# Patient Record
Sex: Male | Born: 1970 | Race: White | Hispanic: No | Smoking: Former smoker
Health system: Southern US, Community
[De-identification: ages and names within clinical notes are randomized; demographics above are authoritative.]

## PROBLEM LIST (undated history)

## (undated) DIAGNOSIS — F1910 Other psychoactive substance abuse, uncomplicated: Secondary | ICD-10-CM

## (undated) DIAGNOSIS — Z720 Tobacco use: Secondary | ICD-10-CM

## (undated) DIAGNOSIS — Z789 Other specified health status: Secondary | ICD-10-CM

## (undated) DIAGNOSIS — R600 Localized edema: Secondary | ICD-10-CM

## (undated) DIAGNOSIS — R0902 Hypoxemia: Secondary | ICD-10-CM

## (undated) DIAGNOSIS — F1990 Other psychoactive substance use, unspecified, uncomplicated: Secondary | ICD-10-CM

## (undated) DIAGNOSIS — Q211 Atrial septal defect: Secondary | ICD-10-CM

## (undated) DIAGNOSIS — G4730 Sleep apnea, unspecified: Secondary | ICD-10-CM

## (undated) DIAGNOSIS — I10 Essential (primary) hypertension: Secondary | ICD-10-CM

## (undated) DIAGNOSIS — J449 Chronic obstructive pulmonary disease, unspecified: Secondary | ICD-10-CM

## (undated) DIAGNOSIS — I739 Peripheral vascular disease, unspecified: Secondary | ICD-10-CM

## (undated) DIAGNOSIS — E669 Obesity, unspecified: Secondary | ICD-10-CM

## (undated) DIAGNOSIS — D751 Secondary polycythemia: Secondary | ICD-10-CM

## (undated) DIAGNOSIS — F1090 Alcohol use, unspecified, uncomplicated: Secondary | ICD-10-CM

## (undated) DIAGNOSIS — Q2112 Patent foramen ovale: Secondary | ICD-10-CM

## (undated) DIAGNOSIS — Z8709 Personal history of other diseases of the respiratory system: Secondary | ICD-10-CM

## (undated) DIAGNOSIS — T7840XA Allergy, unspecified, initial encounter: Secondary | ICD-10-CM

## (undated) DIAGNOSIS — R0602 Shortness of breath: Secondary | ICD-10-CM

## (undated) HISTORY — DX: Other psychoactive substance use, unspecified, uncomplicated: F19.90

## (undated) HISTORY — DX: Atrial septal defect: Q21.1

## (undated) HISTORY — DX: Shortness of breath: R06.02

## (undated) HISTORY — DX: Tobacco use: Z72.0

## (undated) HISTORY — DX: Essential (primary) hypertension: I10

## (undated) HISTORY — DX: Sleep apnea, unspecified: G47.30

## (undated) HISTORY — DX: Patent foramen ovale: Q21.12

## (undated) HISTORY — DX: Localized edema: R60.0

## (undated) HISTORY — DX: Obesity, unspecified: E66.9

## (undated) HISTORY — DX: Hypoxemia: R09.02

## (undated) HISTORY — DX: Peripheral vascular disease, unspecified: I73.9

## (undated) HISTORY — PX: TYMPANOSTOMY TUBE PLACEMENT: SHX32

## (undated) HISTORY — DX: Alcohol use, unspecified, uncomplicated: F10.90

## (undated) HISTORY — DX: Chronic obstructive pulmonary disease, unspecified: J44.9

## (undated) HISTORY — DX: Personal history of other diseases of the respiratory system: Z87.09

## (undated) HISTORY — DX: Allergy, unspecified, initial encounter: T78.40XA

## (undated) HISTORY — DX: Secondary polycythemia: D75.1

## (undated) HISTORY — DX: Other psychoactive substance abuse, uncomplicated: F19.10

## (undated) HISTORY — DX: Other specified health status: Z78.9

---
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
## 2003-05-04 ENCOUNTER — Emergency Department (HOSPITAL_COMMUNITY): Admission: EM | Admit: 2003-05-04 | Discharge: 2003-05-04 | Payer: Self-pay | Admitting: Emergency Medicine

## 2005-03-19 ENCOUNTER — Emergency Department (HOSPITAL_COMMUNITY): Admission: EM | Admit: 2005-03-19 | Discharge: 2005-03-19 | Payer: Self-pay | Admitting: Family Medicine

## 2006-07-30 ENCOUNTER — Emergency Department (HOSPITAL_COMMUNITY): Admission: EM | Admit: 2006-07-30 | Discharge: 2006-07-30 | Payer: Self-pay | Admitting: Family Medicine

## 2008-03-12 ENCOUNTER — Emergency Department (HOSPITAL_COMMUNITY): Admission: EM | Admit: 2008-03-12 | Discharge: 2008-03-12 | Payer: Self-pay | Admitting: Family Medicine

## 2008-07-10 ENCOUNTER — Emergency Department (HOSPITAL_COMMUNITY): Admission: EM | Admit: 2008-07-10 | Discharge: 2008-07-10 | Payer: Self-pay | Admitting: Emergency Medicine

## 2008-11-20 ENCOUNTER — Ambulatory Visit (HOSPITAL_COMMUNITY): Admission: RE | Admit: 2008-11-20 | Discharge: 2008-11-20 | Payer: Self-pay | Admitting: Interventional Cardiology

## 2008-12-07 ENCOUNTER — Ambulatory Visit (HOSPITAL_COMMUNITY): Admission: RE | Admit: 2008-12-07 | Discharge: 2008-12-07 | Payer: Self-pay | Admitting: Interventional Cardiology

## 2008-12-07 ENCOUNTER — Encounter (INDEPENDENT_AMBULATORY_CARE_PROVIDER_SITE_OTHER): Payer: Self-pay | Admitting: Interventional Cardiology

## 2009-01-06 DIAGNOSIS — J449 Chronic obstructive pulmonary disease, unspecified: Secondary | ICD-10-CM | POA: Insufficient documentation

## 2009-01-06 DIAGNOSIS — E669 Obesity, unspecified: Secondary | ICD-10-CM | POA: Insufficient documentation

## 2009-01-06 DIAGNOSIS — J4489 Other specified chronic obstructive pulmonary disease: Secondary | ICD-10-CM | POA: Insufficient documentation

## 2009-01-06 DIAGNOSIS — I739 Peripheral vascular disease, unspecified: Secondary | ICD-10-CM | POA: Insufficient documentation

## 2009-01-06 DIAGNOSIS — F17200 Nicotine dependence, unspecified, uncomplicated: Secondary | ICD-10-CM | POA: Insufficient documentation

## 2009-09-01 ENCOUNTER — Observation Stay (HOSPITAL_COMMUNITY): Admission: EM | Admit: 2009-09-01 | Discharge: 2009-09-06 | Payer: Self-pay | Admitting: Emergency Medicine

## 2009-09-01 ENCOUNTER — Emergency Department (HOSPITAL_COMMUNITY): Admission: EM | Admit: 2009-09-01 | Discharge: 2009-09-01 | Payer: Self-pay | Admitting: Family Medicine

## 2009-09-01 ENCOUNTER — Encounter (INDEPENDENT_AMBULATORY_CARE_PROVIDER_SITE_OTHER): Payer: Self-pay | Admitting: Emergency Medicine

## 2009-09-01 ENCOUNTER — Ambulatory Visit: Payer: Self-pay | Admitting: Vascular Surgery

## 2009-09-03 ENCOUNTER — Ambulatory Visit: Payer: Self-pay | Admitting: Hematology & Oncology

## 2009-09-15 ENCOUNTER — Ambulatory Visit: Payer: Self-pay | Admitting: Hematology & Oncology

## 2009-09-16 ENCOUNTER — Ambulatory Visit: Payer: Self-pay | Admitting: Internal Medicine

## 2009-09-16 ENCOUNTER — Encounter: Payer: Self-pay | Admitting: Physician Assistant

## 2009-09-16 DIAGNOSIS — J449 Chronic obstructive pulmonary disease, unspecified: Secondary | ICD-10-CM | POA: Insufficient documentation

## 2009-09-16 DIAGNOSIS — F1010 Alcohol abuse, uncomplicated: Secondary | ICD-10-CM | POA: Insufficient documentation

## 2009-09-16 DIAGNOSIS — D696 Thrombocytopenia, unspecified: Secondary | ICD-10-CM | POA: Insufficient documentation

## 2009-09-16 DIAGNOSIS — L02419 Cutaneous abscess of limb, unspecified: Secondary | ICD-10-CM | POA: Insufficient documentation

## 2009-09-16 DIAGNOSIS — L03119 Cellulitis of unspecified part of limb: Secondary | ICD-10-CM

## 2009-09-16 LAB — CONVERTED CEMR LAB
ALT: 34 units/L (ref 0–53)
Alkaline Phosphatase: 72 units/L (ref 39–117)
Glucose, Bld: 76 mg/dL (ref 70–99)
HCV Ab: NEGATIVE
Hep B Core Total Ab: NEGATIVE
Hep B S Ab: NEGATIVE
Sodium: 136 meq/L (ref 135–145)
Total Bilirubin: 0.8 mg/dL (ref 0.3–1.2)
Total Protein: 7.5 g/dL (ref 6.0–8.3)

## 2009-09-17 ENCOUNTER — Encounter: Payer: Self-pay | Admitting: Physician Assistant

## 2009-09-17 LAB — CONVERTED CEMR LAB
Potassium: 5 meq/L (ref 3.5–5.3)
Sodium: 141 meq/L (ref 135–145)

## 2009-09-21 ENCOUNTER — Encounter: Payer: Self-pay | Admitting: Physician Assistant

## 2009-09-21 LAB — MANUAL DIFFERENTIAL (CHCC SATELLITE)
ALC: 1.7 10*3/uL (ref 0.9–3.3)
ANC (CHCC HP manual diff): 4.2 10*3/uL (ref 1.5–6.5)
LYMPH: 25 % (ref 14–48)
MONO: 6 % (ref 0–13)
Platelet Morphology: NORMAL
SEG: 64 % (ref 40–75)

## 2009-09-21 LAB — CBC WITH DIFFERENTIAL (CANCER CENTER ONLY)
HCT: 69.4 % — ABNORMAL HIGH (ref 38.7–49.9)
HGB: 23 g/dL — ABNORMAL HIGH (ref 13.0–17.1)
MCHC: 33.1 g/dL (ref 32.0–35.9)
RBC: 6.46 10*6/uL — ABNORMAL HIGH (ref 4.20–5.70)

## 2009-09-21 LAB — CHCC SATELLITE - SMEAR

## 2009-09-22 ENCOUNTER — Telehealth: Payer: Self-pay | Admitting: Physician Assistant

## 2009-09-22 DIAGNOSIS — D751 Secondary polycythemia: Secondary | ICD-10-CM | POA: Insufficient documentation

## 2009-09-23 LAB — RETICULOCYTES (CHCC): ABS Retic: 71.3 10*3/uL (ref 19.0–186.0)

## 2009-09-23 LAB — IRON AND TIBC
%SAT: 26 % (ref 20–55)
Iron: 95 ug/dL (ref 42–165)
TIBC: 370 ug/dL (ref 215–435)
UIBC: 275 ug/dL

## 2009-09-23 LAB — FERRITIN: Ferritin: 79 ng/mL (ref 22–322)

## 2009-10-06 LAB — MANUAL DIFFERENTIAL (CHCC SATELLITE)
ALC: 1.2 10*3/uL (ref 0.9–3.3)
SEG: 73 % (ref 40–75)

## 2009-10-06 LAB — CBC WITH DIFFERENTIAL (CANCER CENTER ONLY)
Platelets: 99 10*3/uL — ABNORMAL LOW (ref 145–400)
RBC: 6.21 10*6/uL — ABNORMAL HIGH (ref 4.20–5.70)
RDW: 10.5 % (ref 10.5–14.6)
WBC: 5.4 10*3/uL (ref 4.0–10.0)

## 2009-10-06 LAB — CHCC SATELLITE - SMEAR

## 2009-10-08 ENCOUNTER — Telehealth: Payer: Self-pay | Admitting: Physician Assistant

## 2009-10-26 ENCOUNTER — Ambulatory Visit: Payer: Self-pay | Admitting: Hematology & Oncology

## 2009-10-27 ENCOUNTER — Encounter: Payer: Self-pay | Admitting: Physician Assistant

## 2009-10-27 LAB — FERRITIN: Ferritin: 44 ng/mL (ref 22–322)

## 2009-10-27 LAB — MANUAL DIFFERENTIAL (CHCC SATELLITE)
ALC: 0.8 10*3/uL — ABNORMAL LOW (ref 0.9–3.3)
ANC (CHCC HP manual diff): 4.8 10*3/uL (ref 1.5–6.5)
Eos: 2 % (ref 0–7)
MONO: 3 % (ref 0–13)
Platelet Morphology: NORMAL
SEG: 81 % — ABNORMAL HIGH (ref 40–75)

## 2009-10-27 LAB — CBC WITH DIFFERENTIAL (CANCER CENTER ONLY)
HCT: 70.1 % — ABNORMAL HIGH (ref 38.7–49.9)
MCV: 109 fL — ABNORMAL HIGH (ref 82–98)
Platelets: 104 10*3/uL — ABNORMAL LOW (ref 145–400)
RBC: 6.4 10*6/uL — ABNORMAL HIGH (ref 4.20–5.70)
WBC: 6 10*3/uL (ref 4.0–10.0)

## 2009-11-04 ENCOUNTER — Encounter: Payer: Self-pay | Admitting: Physician Assistant

## 2009-11-17 ENCOUNTER — Ambulatory Visit: Payer: Self-pay | Admitting: Internal Medicine

## 2009-11-17 DIAGNOSIS — Q2111 Secundum atrial septal defect: Secondary | ICD-10-CM | POA: Insufficient documentation

## 2009-11-17 DIAGNOSIS — Q211 Atrial septal defect: Secondary | ICD-10-CM | POA: Insufficient documentation

## 2009-11-17 DIAGNOSIS — R0902 Hypoxemia: Secondary | ICD-10-CM | POA: Insufficient documentation

## 2009-11-18 ENCOUNTER — Encounter: Payer: Self-pay | Admitting: Physician Assistant

## 2009-11-19 ENCOUNTER — Encounter (INDEPENDENT_AMBULATORY_CARE_PROVIDER_SITE_OTHER): Payer: Self-pay | Admitting: Internal Medicine

## 2009-11-19 ENCOUNTER — Ambulatory Visit: Payer: Self-pay | Admitting: Physician Assistant

## 2009-11-19 ENCOUNTER — Ambulatory Visit (HOSPITAL_COMMUNITY): Admission: RE | Admit: 2009-11-19 | Discharge: 2009-11-19 | Payer: Self-pay | Admitting: Internal Medicine

## 2009-11-19 ENCOUNTER — Ambulatory Visit: Payer: Self-pay | Admitting: Vascular Surgery

## 2009-11-23 ENCOUNTER — Encounter (INDEPENDENT_AMBULATORY_CARE_PROVIDER_SITE_OTHER): Payer: Self-pay | Admitting: *Deleted

## 2009-11-26 ENCOUNTER — Ambulatory Visit (HOSPITAL_BASED_OUTPATIENT_CLINIC_OR_DEPARTMENT_OTHER): Admission: RE | Admit: 2009-11-26 | Discharge: 2009-11-26 | Payer: Self-pay | Admitting: Internal Medicine

## 2009-11-26 ENCOUNTER — Encounter: Payer: Self-pay | Admitting: Physician Assistant

## 2009-12-04 ENCOUNTER — Ambulatory Visit: Payer: Self-pay | Admitting: Internal Medicine

## 2009-12-08 ENCOUNTER — Ambulatory Visit: Payer: Self-pay | Admitting: Hematology & Oncology

## 2009-12-14 ENCOUNTER — Telehealth: Payer: Self-pay | Admitting: Physician Assistant

## 2009-12-14 DIAGNOSIS — G4733 Obstructive sleep apnea (adult) (pediatric): Secondary | ICD-10-CM | POA: Insufficient documentation

## 2009-12-16 ENCOUNTER — Encounter (INDEPENDENT_AMBULATORY_CARE_PROVIDER_SITE_OTHER): Payer: Self-pay | Admitting: Nurse Practitioner

## 2009-12-16 LAB — CBC WITH DIFFERENTIAL (CANCER CENTER ONLY)
HCT: 69.1 % — ABNORMAL HIGH (ref 38.7–49.9)
MCV: 104 fL — ABNORMAL HIGH (ref 82–98)
RDW: 10.6 % (ref 10.5–14.6)
WBC: 7.3 10*3/uL (ref 4.0–10.0)

## 2009-12-16 LAB — MANUAL DIFFERENTIAL (CHCC SATELLITE)
ALC: 1.2 10*3/uL (ref 0.9–3.3)
LYMPH: 16 % (ref 14–48)
MONO: 7 % (ref 0–13)
SEG: 77 % — ABNORMAL HIGH (ref 40–75)

## 2009-12-16 LAB — FERRITIN: Ferritin: 24 ng/mL (ref 22–322)

## 2009-12-21 ENCOUNTER — Telehealth (INDEPENDENT_AMBULATORY_CARE_PROVIDER_SITE_OTHER): Payer: Self-pay | Admitting: *Deleted

## 2009-12-22 ENCOUNTER — Ambulatory Visit: Payer: Self-pay | Admitting: Nurse Practitioner

## 2009-12-22 DIAGNOSIS — H10029 Other mucopurulent conjunctivitis, unspecified eye: Secondary | ICD-10-CM | POA: Insufficient documentation

## 2010-01-07 ENCOUNTER — Encounter: Payer: Self-pay | Admitting: Physician Assistant

## 2010-02-02 ENCOUNTER — Ambulatory Visit: Payer: Self-pay | Admitting: Pulmonary Disease

## 2010-02-02 ENCOUNTER — Telehealth (INDEPENDENT_AMBULATORY_CARE_PROVIDER_SITE_OTHER): Payer: Self-pay | Admitting: Nurse Practitioner

## 2010-02-03 ENCOUNTER — Ambulatory Visit: Payer: Self-pay | Admitting: Hematology & Oncology

## 2010-03-29 NOTE — Letter (Signed)
Summary: MAILED REQUESTED RECORDS TO [facility]  MAILED REQUESTED RECORDS TO [facility]   Imported By: Arta Bruce 01/07/2010 12:17:25  _____________________________________________________________________  External Attachment:    Type:   Image     Comment:   External Document

## 2010-03-29 NOTE — Progress Notes (Signed)
Summary: not enough specimen  Phone Note From Other Clinic   Summary of Call: [facility] called there was not enough specimen for Hepatitis BE antibody this was part of the chronic hep panel. It was drawn on 09-16-09 Initial call taken by: Levon Hedger,  September 22, 2009 4:20 PM  Follow-up for Phone Call        cancel it Follow-up by: Brynda Rim,  September 23, 2009 5:35 AM  Additional Follow-up for Phone Call Additional follow up Details #1::        ok Additional Follow-up by: Armenia Shannon,  September 23, 2009 12:53 PM

## 2010-03-29 NOTE — Letter (Signed)
Summary: [facility]/PROGRESS NOTE  [facility]/PROGRESS NOTE   Imported By: Arta Bruce 10/08/2009 14:44:01  _____________________________________________________________________  External Attachment:    Type:   Image     Comment:   External Document

## 2010-03-29 NOTE — Assessment & Plan Note (Signed)
Summary: XFU---RIGHT EXTREMITY CELLULITUS//YC   Vital Signs:  Patient profile:   40 year old male Weight:      285 pounds Temp:     97.9 degrees F oral Pulse rate:   98 / minute Pulse rhythm:   regular Resp:     18 per minute BP sitting:   129 / 84  (left arm)  Vitals Entered By: Armenia Shannon (September 16, 2009 3:10 PM) CC: hospital follow-up, no current issues Is Patient Diabetic? No Pain Assessment Patient in pain? no       Does patient need assistance? Functional Status Self care Ambulation Normal   Primary Care Provider:  Tereso Newcomer, PA-C  CC:  hospital follow-up and no current issues.  History of Present Illness: 40 year old male presents for posthospitalization followup.  He was admitted for cellulitis of his right lower extremity.  The patient has had a long history of polycythemia.  This had an initial workup sometime last summer.  The patient describes seeing multiple specialists including a cardiologist and a pulmonologist.  He did have a transesophageal echocardiogram that demonstrated an EF of 55-60%,  a patent foramen ovale with right-to-left shunting and increased right-sided pressures.  He apparently had a CT scan.  He was worked up extensively to rule out cancer.  When he was admitted to [facility] recently for his cellulitis, he had venous Dopplers that were negative for DVT.  He did have an abdominal ultrasound that was notable for hepatomegaly but no cirrhosis.  He had a hemoglobin of 21.5 with an MCV of 108.2.  His ferritin was 44.  His B12 was 365.  His glucose was 108, creatinine 0.92, LFTs okay and albumin 3.3.  His TSH was 1.005.  He had an MRI of his leg that demonstrated findings of cellulitis but no evidence of osteomyelitis.  Is currently doing much better.  He denies any fevers or chills.  His leg is much better.  He states he sees a hematologist next week.  He was seen by Dr.Ennever in the hospital.  He recommended obtaining a JAK-2 analysis.   This was not detected.  He is on aspirin a day.  There is a question whether or not he will undergo phlebotomy as an outpatient.  Current Medications (verified): 1)  No Daily Medications  Allergies (verified): No Known Drug Allergies  Past History:  Past Medical History: Current Problems:  PERIPHERAL VASCULAR DISEASE (ICD-443.9)  -  patient denis TOBACCO ABUSE (ICD-305.1) OBESITY (ICD-278.00) COPD Hx of BRONCHITIS (ICD-491.20) Polycythemia Vera   a.  Dr. Myna Hidalgo TEE (echo) 2010:   a.  EF 55-60%; patent foramen ovale with R to L shunt; evidence of elevated right pressures  Family History: Wegener's disease -- mother no colon or prostate cancer Lung CA - PGM  Social History: Patient is a current smoker.    a.  1ppd x 25 years Social ETOH use   a.  previously drinking 12 pack beer a day; cut down to a few beers a day now since in hospital +Marijuana +Valium (not prescribed) Unemployed  Review of Systems General:  Denies chills and fever. CV:  Denies chest pain or discomfort, fainting, and shortness of breath with exertion; nyha class 2. Resp:  Complains of cough and wheezing. GI:  Denies change in bowel habits.  Physical Exam  General:  alert, well-developed, and well-nourished.   Head:  normocephalic and atraumatic.   Mouth:  cyanotic appearing lips  Neck:  supple, no carotid bruits, and no  cervical lymphadenopathy.   Lungs:  decreased breath sounds bilat exp wheezing no rales  Heart:  normal rate and regular rhythm.  distant heart sounds  Abdomen:  soft, non-tender, and no hepatomegaly.   Extremities:  1+ left pedal edema and 1+ right pedal edema.   erythema of RLE appears to be in resolution no calor or dolor  Neurologic:  alert & oriented X3 and cranial nerves II-XII intact.   Skin:  dusky appearance to skin of toes bilat  Psych:  normally interactive.     Impression & Recommendations:  Problem # 1:  ALCOHOL ABUSE (ICD-305.00) check labs for  hepatitis  Orders: T-Comprehensive Metabolic Panel (16109-60454) T-Hepatitis A Antibody (09811-91478) T-Hepatitis B Core Antibody (29562-13086) T-Hepatitis B Surface Antibody (57846-96295) T-Hepatitis B Surface Antigen (28413-24401) T-Hepatitis C Antibody (02725-36644)  Problem # 2:  THROMBOCYTOPENIA (ICD-287.5) as above  Orders: T-Comprehensive Metabolic Panel (03474-25956) T-Hepatitis A Antibody (38756-43329) T-Hepatitis B Core Antibody (51884-16606) T-Hepatitis B Surface Antibody (30160-10932) T-Hepatitis B Surface Antigen (35573-22025) T-Hepatitis C Antibody (42706-23762)  Problem # 3:  COPD (ICD-496) add spiriva and proventil  His updated medication list for this problem includes:    Spiriva Handihaler 18 Mcg Caps (Tiotropium bromide monohydrate) .Marland Kitchen... 1 inhalation daily    Proventil Hfa 108 (90 Base) Mcg/act Aers (Albuterol sulfate) .Marland Kitchen... 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 1 inhalation daily 3)  Proventil Hfa 108 (90 Base) Mcg/act Aers (Albuterol sulfate) .Marland Kitchen.. 1-2 puffs every 4-6 hours as needed 4)  Aspirin 81 Mg Tabs (Aspirin) .... Once daily  Patient Instructions: 1)  Need ALL records from Dr. Nicholos Johns, Dr. Eldridge Dace and lung doctor you saw (patient has name at home). 2)  Ask Dr. Myna Hidalgo to send me his notes. 3)  Return for follow up with Scott in 4 weeks. 4)  Finish antibiotics. 5)  Call me if leg gets red or you develop a fever.  You will need to be seen that day.  Go to the ER if it is on the weekend. Prescriptions: PROVENTIL HFA 108 (90 BASE) MCG/ACT  AERS (ALBUTEROL SULFATE) 1-2 puffs every 4-6 hours as needed  #1 x 11   Entered and Authorized by:   Tereso Newcomer PA-C   Signed by:   Tereso Newcomer PA-C on 09/16/2009   Method used:   Print then Give to Patient   RxID:   [id number] SPIRIVA HANDIHALER 18 MCG CAPS (TIOTROPIUM BROMIDE MONOHYDRATE) 1 inhalation daily  #1 x 5   Entered and Authorized by:   Tereso Newcomer PA-C   Signed by:   Tereso Newcomer PA-C on 09/16/2009   Method used:   Print then Give to Patient   RxID:   [id number]    TE Echocardiogram  Procedure date:  12/07/2008  Findings:      Study Conclusions    1. Left ventricle: Systolic function was normal. The estimated      ejection fraction was in the range of 55% to 60%.   2. Aortic valve: No evidence of vegetation.   3. Mitral valve: No evidence of vegetation.   4. Left atrium: No evidence  of thrombus in the atrial cavity or      appendage.   5. Right ventricle: The cavity size was moderately dilated. Systolic      function was mildly reduced.   6. Right atrium: The atrium was moderately dilated.   7. Atrial septum: There was a patent foramen ovale with evidence of      right to left shunting, which also suggests elevated right sided      pressures.   Transesophageal echocardiography. 2D and color Doppler. Patient   status: Outpatient. Location: Endoscopy.

## 2010-03-29 NOTE — Assessment & Plan Note (Signed)
Summary: Acute - Bacterial Conjunctivitis   Vital Signs:  Patient profile:   40 year old male Height:      73 inches Weight:      287.4 pounds BMI:     38.05 O2 Sat:      88 % on Room air Temp:     98.1 degrees F oral Pulse rate:   98 / minute Pulse rhythm:   regular Resp:     20 per minute BP sitting:   110 / 70  (left arm) Cuff size:   regular  Vitals Entered By: Levon Hedger (December 22, 2009 11:49 AM)  Nutrition Counseling: Patient's BMI is greater than 25 and therefore counseled on weight management options.  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 No Pain Assessment Patient in pain? no       Does patient need assistance? 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  History of Present Illness:  Pt into the office with requests to speak with the Klair Leising. He has concerns about his recent health diagnoses and treatment options. Reports that he was admitted to the hospital for cellulitis back in July. He has had several visits into this office since that time and has been dx with polycythemia (secondary) Pt did have a sleep study done and proved that he has severe sleep apnea.  He had severe desaturation. He was started on oxygen to wear at night. He has gotten oxygen at home and wears it at night. Oxygen was delivered 5 days ago and he applies it at 2 liters. 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  He is used to being up and moving around/ hands on on jobs.  Pt does  have 2 twin boys who live with their mother however he provides financial support for them while he is working.   Habits & Providers  Alcohol-Tobacco-Diet     Tobacco Status: current     Tobacco Counseling: to quit use of tobacco products     Cigarette Packs/Day: 0.75     Year Started: age 40  Exercise-Depression-Behavior     Does Patient Exercise: no     Have you felt down or hopeless? no     Have you felt little pleasure in things? no     Depression Counseling: not indicated; screening negative for depression  Medications Prior to Update: 1)  Spiriva Handihaler 18 Mcg Caps (Tiotropium Bromide Monohydrate) .Marland Kitchen.. 1 Inhalation Daily 2)  Proventil Hfa 108 (90 Base) Mcg/act Aers (Albuterol Sulfate) .Marland Kitchen.. 1-2 Puffs Every 4-6 Hours As Needed 3)  Aspirin 81 Mg Tabs (Aspirin) .... Once Daily 4)  O2 Per Nasal Cannula, Humidified .... 2 Lpm--Titrate To Keep O2 Above 90 % Sat  Allergies (verified): No Known Drug Allergies  Social History: Packs/Day:  0.75 Does Patient Exercise:  no  Review of Systems Eyes:  +bacterial infection in the left eye. CV:  Denies chest pain or discomfort. Resp:  Complains of cough and shortness of breath. GI:  Denies abdominal pain, nausea, and vomiting.  Physical Exam  General:  alert.   Head:  normocephalic.   Eyes:  left eye - inflammation  conjunctiva (inflammed) exudate on eyelid Mouth:  cyanotic lips Lungs:  scattered expiratory wheezes Heart:  normal rate and regular rhythm.   Msk:  up to the exam table Neurologic:  alert & oriented X3.   Psych:  Oriented X3.     Impression & Recommendations:  Problem # 1:  SLEEP APNEA, OBSTRUCTIVE (ICD-327.23)  Problem # 2:  HYPOXEMIA (ICD-799.02) pt to wear oxygen at night O2 sat down to 88 in office today he may need  Orders: Pulse Oximetry (single measurment) (29562)  Problem # 3:  CONJUNCTIVITIS, BACTERIAL (ICD-372.03)  His updated medication list for this problem includes:    Tobramycin  Sulfate 0.3 % Soln (Tobramycin sulfate) .Marland Kitchen... 2 drops in left eye three times a day for infection  Problem # 4:  COPD (ICD-496) pt has not started spiriva (need to take tax forms to pharmacy) Will give  His updated medication list for this problem includes:    Spiriva Handihaler 18 Mcg Caps (Tiotropium bromide monohydrate) .Marland Kitchen... 1 inhalation daily    Proventil Hfa 108 (90 Base) Mcg/act Aers (Albuterol sulfate) .Marland Kitchen... 1-2 puffs every 4-6 hours as needed    Symbicort 160-4.5 Mcg/act Aero (Budesonide-formoterol fumarate) ..... One puff inhalation two times a day  Complete Medication List: 1)  Spiriva Handihaler 18 Mcg Caps (Tiotropium bromide monohydrate) .Marland Kitchen.. 1 inhalation daily 2)  Proventil Hfa 108 (90 Base) Mcg/act Aers (Albuterol sulfate) .Marland Kitchen.. 1-2 puffs every 4-6 hours as needed 3)  Aspirin 81 Mg Tabs (Aspirin) .... Once daily 4)  O2 Per Nasal Cannula, Humidified  .... 2 lpm--titrate to keep o2 above 90 % sat 5)  Tobramycin Sulfate 0.3 % Soln (Tobramycin sulfate) .... 2 drops in left eye three times a day for infection 6)  Symbicort 160-4.5 Mcg/act Aero (Budesonide-formoterol fumarate) .... One puff inhalation two times a day  Patient Instructions: 1)  You have declined the flu vaccine today.  If you change your mind you can return for a nurse visit. 2)  Use Symbicort 160/4.5 - one inhalation two times a day (samples given).  Use this until you get the spiriva. 3)  Wear your oxygen at night as ordered. 4)  Keep your appointment with the pulmonologist.  He will be able to direct you more on treatment and limitation of current problem Prescriptions: SYMBICORT 160-4.5 MCG/ACT AERO (BUDESONIDE-FORMOTEROL FUMARATE) One puff inhalation two times a day  #1 x 0   Entered and Authorized by:   Lehman Prom FNP   Signed by:   Lehman Prom FNP on 12/22/2009   Method used:   Samples Given   RxID:   [id number] TOBRAMYCIN SULFATE 0.3 % SOLN (TOBRAMYCIN SULFATE) 2 drops in left eye three  times a day for infection  #70ml x 0   Entered and Authorized by:   Lehman Prom FNP   Signed by:   Lehman Prom FNP on 12/22/2009   Method used:   Print then Give to Patient   RxID:   [id number]    Orders Added: 1)  Est. Patient Level III [64403] 2)  Pulse Oximetry (single measurment) [47425]    Prevention & Chronic Care Immunizations   Influenza vaccine: Not documented   Influenza vaccine deferral: Refused  (12/22/2009)    Tetanus booster: Not documented    Pneumococcal vaccine: Not documented  Other Screening   Smoking status: current  (  12/22/2009)  Lipids   Total Cholesterol: Not documented   LDL: Not documented   LDL Direct: Not documented   HDL: Not documented   Triglycerides: Not documented

## 2010-03-29 NOTE — Letter (Signed)
Summary: [facility]  [facility]   Imported By: Arta Bruce 11/04/2009 14:41:02  _____________________________________________________________________  External Attachment:    Type:   Image     Comment:   External Document

## 2010-03-29 NOTE — Progress Notes (Signed)
  Phone Note Outgoing Call   Summary of Call: Please call Dr. Gustavo Lah office and let him know the sleep study was set up for 9.30.2011. He is at the [facility] Initial call taken by: Brynda Rim,  October 08, 2009 5:09 AM  Follow-up for Phone Call        Left message on answering machine for pt to call back...Marland KitchenMarland KitchenArmenia Shannon  October 08, 2009 4:45 PM   spoke with Bonita Quin, Dr. Gustavo Lah nurse and she is aware of appt Follow-up by: Armenia Shannon,  October 13, 2009 11:19 AM

## 2010-03-29 NOTE — Letter (Signed)
Summary: [facility]  [facility]   Imported By: Arta Bruce 12/03/2009 14:52:10  _____________________________________________________________________  External Attachment:    Type:   Image     Comment:   External Document

## 2010-03-29 NOTE — Letter (Signed)
Summary: Handout Printed  Printed Handout:  - Eye - Pink Eye (Bacterial Conjunctivitis) 

## 2010-03-29 NOTE — Progress Notes (Signed)
Summary: PULMONARY REFERRAL   Phone Note From Other Clinic   Caller: Appointment Secretary Summary of Call: THEY CALL ME FROM [facility]  PULMONARY BECAUSE THE PT NOS TO HIS APPT TODAY  AND HE IS BEING CANCELED HIS PAST APPTS AND I CALL THE PT TO FIND OUT WHAT HAPPEN AND HE SAID THAT HE FORGOT TO CALL  TO CANCELED CAUSE HE CAN'T AFFORD IT RIGHT NOW AND HE WILL CALL AFTER THE HOLIDAYS . I JUST WANTED TO NOTED IN PT CHART  Initial call taken by: Cheryll Dessert,  February 02, 2010 3:19 PM  Follow-up for Phone Call        Received. Looks like pt was referred to pulmonary from hematology because he has polycythemia. He is becoming hypoxic needs monitoring by pulmonary.  he does already have oxgen at home Follow-up by: Lehman Prom FNP,  February 02, 2010 3:30 PM

## 2010-03-29 NOTE — Progress Notes (Signed)
Summary: NEED TO TALK TO A DR.  Phone Note Call from Patient   Summary of Call: NEED TO SPEAK WITH DR Cherylann Parr, NEED A OXIGE TANK 24 HRS , I TOLD HIM DR Gretta Began IS NOT LONGER IN THESE PRACTICE HI ASK TO TALK TO A DR. HIS PHONE # [phone] Initial call taken by: Domenic Polite,  December 21, 2009 12:21 PM  Follow-up for Phone Call        Please call [facility] and see about getting pt. set up with Home sats at 82% on RA. Please see if we can get him into pulmonary any earlier then the 11th Follow-up by: Julieanne Manson MD,  December 22, 2009 12:11 AM  Additional Follow-up for Phone Call Additional follow up Details #1::        I call [facility] and they said that he have oxigen since 12-17-09. I call [facility] and don't have cancelations .  Additional Follow-up by: Cheryll Dessert,  December 22, 2009 10:15 AM    Additional Follow-up for Phone Call Additional follow up Details #2::    CALLED PT TO LET HIM KNOW SCRIPT WAS READY FOR PICK UP/HE SAID HE WAS ALREADY ON OXYGEN/DIDN'T KNOW WHY THIS WAS WROTE//SAID HE WAS WANTING TO TALK TO SCOTT BECAUSE SCOTT SAID HE NEEDED TO FIND A JOB WERE HE COULD TAKE MORE BREAK AND NOT STAND SO LONG//HE IN CONSTRUCTION AND DOESN'T KNOW WHAT TO DO ABOUT THE SITUATION Follow-up by: Arta Bruce,  December 22, 2009 10:24 AM  Additional Follow-up for Phone Call Additional follow up Details #3:: Details for Additional Follow-up Action Taken: MADE APPT WITH MARTIN TODAY Additional Follow-up by: Arta Bruce,  December 22, 2009 10:25 AM  New/Updated Medications: * O2 PER NASAL CANNULA, HUMIDIFIED 2 LPM--titrate to keep O2 above 90 % sat Prescriptions: O2 PER NASAL CANNULA, HUMIDIFIED 2 LPM--titrate to keep O2 above 90 % sat  #1 x 0   Entered and Authorized by:   Julieanne Manson MD   Signed by:   Julieanne Manson MD on 12/22/2009   Method used:   Print then Give to Patient   RxID:   [id number]

## 2010-03-29 NOTE — Letter (Signed)
Summary: *Referral Letter  [facility]  [address] [place], [place] [address]   Phone: [phone]  Fax: [phone]    12/22/2009  Billy [name] [address] [place], [place]  [address]  Phone: [phone]  To whom it may concern: Billy [name] is an established patient in this office.  He is being evaluated for ongoing breathing problems.  He has started to wear oxygen at night and he will be evaluated by the specialist to determine his need for further use. 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  He has been advised that he will need to take frequent breaks which include sitting in order to maintain is oxygen saturation during the day.  Again, he will be evaluated by the specialist future needs.  This appointment is upcoming in November.  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) 5)  POLYCYTHEMIA, SECONDARY (ICD-289.0) 6)  THROMBOCYTOPENIA (ICD-287.5) 7)  COPD (ICD-496) 8)  PERIPHERAL VASCULAR DISEASE (ICD-443.9) 9)  TOBACCO ABUSE (ICD-305.1) 10)  OBESITY (ICD-278.00) 11)  Hx of BRONCHITIS (ICD-491.20)   Current Medications: 1)  SPIRIVA HANDIHALER 18 MCG CAPS (TIOTROPIUM BROMIDE MONOHYDRATE) 1 inhalation daily 2)  PROVENTIL HFA 108 (90 BASE) MCG/ACT AERS (ALBUTEROL SULFATE) 1-2 puffs every 4-6 hours as needed 3)  ASPIRIN 81 MG TABS (ASPIRIN) once daily 4)  * O2 PER NASAL CANNULA, HUMIDIFIED 2 LPM--titrate to keep O2 above 90 % sat 5)  TOBRAMYCIN SULFATE 0.3 % SOLN (TOBRAMYCIN SULFATE) 2 drops in left eye three times a day for infection 6)  SYMBICORT 160-4.5 MCG/ACT AERO (BUDESONIDE-FORMOTEROL FUMARATE) One puff inhalation two times a day   Past Medical History: 1)  Current Problems:  2)  PERIPHERAL VASCULAR DISEASE (ICD-443.9)  -  patient denis 3)  TOBACCO ABUSE (ICD-305.1) 4)   OBESITY (ICD-278.00) 5)  COPD 6)  Hx of BRONCHITIS (ICD-491.20) 7)  Polycythemia  8)    a.  Dr. Myna Hidalgo . . . JAK-2 analysis is negative 9)    b.  likely physiologic secondary to chronic hypoxia (? OSA + COPD + obesity) 10)    c.  s/p phlebotomy x 1 11)  TEE (echo) 2010: 12)    a.  EF 55-60%; patent foramen ovale with R to L shunt; evidence of elevated right pressures 13)  Sleep Apnea 14)    a. Mod. severe by sleep study 9.30.2011:  AHI 44.8 per hour; oxygen desat to a nadir of 65%; unsuccessful CPAP titration; significant hypoxemia even with supplemental O2 at 3LPM (desat to 70-80%); needs eval for cardiopulmonary disease and upper airway obstruction   Please contact us if you have any further questions or need additional information.  Sincerely,    Lehman Prom FNP

## 2010-03-29 NOTE — Progress Notes (Signed)
Summary: Secondary Polycythemia. . . . needs sleep study  Phone Note Outgoing Call   Summary of Call: Spoke to Dr. Myna Hidalgo today. Patient needs to be set up for a sleep study to rule out sleep apnea. Please notify patient and set up test. Order in basket.  Initial call taken by: Brynda Rim,  September 22, 2009 5:46 PM  Follow-up for Phone Call        pt is aware Follow-up by: Armenia Shannon,  September 24, 2009 9:49 AM  Additional Follow-up for Phone Call Additional follow up Details #1::        PT DON'T HAVE THE ORANGE CARD AND HIS APPT FOR ELIGIBILITY IS 11-04-09 @ 2:30 PM . DO YOU WANT ME TO WAIT UNTIL HE GET HIS CARD . 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  He needs to get this test done soon.  Try to get him in for a sooner appt and then get the sleep study scheduled. Thanks. Tereso Newcomer PA-C  September 24, 2009 1:13 PM   Additional Follow-up for Phone Call Additional follow up Details #3:: Details for Additional Follow-up Action Taken: I SEND THE REFERRAL TO SLEEP STUDY TODAY I JUST HAVE TO WAIT FOR THE APPT . AND FOR THE ELIGIBLITY I GOT HIM AN APPT 10-08-09@ 3:45 PM . I LEAVE A VOICE MESSAGE FOR HIM TO CALL ME BACK . 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  Dr. Myna Hidalgo . . . JAK-2 analysis is negative   b.  likely physiologic secondary to chronic hypoxia (? OSA + COPD + obesity)   c.  s/p phlebotomy x 1 TEE (echo) 2010:   a.  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 Myna Hidalgo patient likely has secondary polycythemia from chronic hypoxia likely due to sleep apnea in the setting of obesity and COPD with ongoing tobacco abuse JAK-2 analysis is + in about 80% of patients with polycythemia vera . . . his was negative BM biopsy would clear things up but patient refuses this he desat to 60s% in the hospital and likely has sleep apnea he was phlebotomized x 1 continues on ASA Dr. Myna Hidalgo plans to see him back may all by physiologic from chronic hypoxia will get sleep study  Orders: Split Night (Split Night)  Complete Medication List: 1)  No Daily Medications  2)  Spiriva Handihaler 18 Mcg Caps (Tiotropium bromide monohydrate) .Marland Kitchen.. 1 inhalation daily 3)  Proventil Hfa 108 (90 Base) Mcg/act Aers (Albuterol sulfate) .Marland Kitchen.. 1-2 puffs every 4-6 hours as needed 4)  Aspirin 81 Mg Tabs (Aspirin) .... Once daily

## 2010-03-29 NOTE — Assessment & Plan Note (Signed)
Summary: Cellulitis   Vital Signs:  Patient profile:   40 year old male Weight:      291 pounds O2 Sat:      83 % on Room air Temp:     98.1 degrees F oral Pulse rate:   96 / minute Pulse rhythm:   regular Resp:     18 per minute BP sitting:   132 / 74  (left arm)  Vitals Entered By: CMA Student Kenyatta Jeffries  O2 Flow:  Room air CC: red right leg Is Patient Diabetic? No Pain Assessment Patient in pain? yes     Location: right leg Intensity: 3 Type: burning  Does patient need assistance? Functional Status Self care Ambulation Normal   Primary Care Provider:  Tereso Newcomer, PA-C  CC:  red right leg.  History of Present Illness: Here for prob cellulitis of right leg.  Has noted changes for 10 days.  Works in [employer].  Has noted some blisters.  Popped one.  Now with pustular drainage.  No fever.  Pain is minimal.  Not like it was when he was admx for cellulitis.  No allergies.  Has seen heme.  Has had 2 phlebotomies.  Dr. Myna Hidalgo thinks secondary polycythemia as JAK-2 was neg.  Pt. declines BM bx.  Sleep study pending.  Never got records from card.  I think he saw Dr. Jim Like.  He has a TEE in the system with PFO and R to L shunt.  He is a long time smoker.  He is obese.  He has chronic LE edema.  He prob has sleep apnea.  Dr. Myna Hidalgo thinks that is major cause of hypoxia leading to polycythemia.  He has cyanotic lips usually.  O2 levels always are low.  He is not using inhalers.  Denies much cough or sob.    Current Medications (verified): 1)  No Daily Medications 2)  Spiriva Handihaler 18 Mcg Caps (Tiotropium Bromide Monohydrate) .Marland Kitchen.. 1 Inhalation Daily 3)  Proventil Hfa 108 (90 Base) Mcg/act Aers (Albuterol Sulfate) .Marland Kitchen.. 1-2 Puffs Every 4-6 Hours As Needed 4)  Aspirin 81 Mg Tabs (Aspirin) .... Once Daily  Allergies (verified): No Known Drug Allergies  Past History:  Past Medical History: Last updated: 09/22/2009 Current Problems:  PERIPHERAL VASCULAR  DISEASE (ICD-443.9)  -  patient Billy [name] TOBACCO ABUSE (ICD-305.1) OBESITY (ICD-278.00) COPD Hx of BRONCHITIS (ICD-491.20) Polycythemia    a.  Dr. Myna Hidalgo . . . JAK-2 analysis is negative   b.  likely physiologic secondary to chronic hypoxia (? OSA + COPD + obesity)   c.  s/p phlebotomy x 1 TEE (echo) 2010:   a.  EF 55-60%; patent foramen ovale with R to L shunt; evidence of elevated right pressures  Physical Exam  General:  alert, well-developed, and well-nourished.   Head:  normocephalic and atraumatic.   Mouth:  lips are blue as usual Lungs:  prolonged exp wheezes bilat  Heart:  normal rate and regular rhythm.   Extremities:  large area of erythema noted on RLE desquamated area over shin with pustular drainage + tend to palp  + calor calf soft and no palp cords  Neurologic:  alert & oriented X3 and cranial nerves II-XII intact.   Psych:  normally interactive.     Impression & Recommendations:  Problem # 1:  COPD (ICD-496) refill meds  His updated medication list for this problem includes:    Spiriva Handihaler 18 Mcg Caps (Tiotropium bromide monohydrate) .Marland Kitchen... 1 inhalation daily    Proventil  Hfa 108 (90 Base) Mcg/act Aers (Albuterol sulfate) .Marland Kitchen... 1-2 puffs every 4-6 hours as needed  Problem # 2:  CELLULITIS AND ABSCESS OF LEG EXCEPT FOOT (ICD-682.6)  culture  send for u/s to r/o DVT (doubt) bactrim x 10 days close f/u on Friday to recheck prefer he stay out of work but worried he will lose job  Orders: LE Venous Duplex (DVT) (DVT) T-Culture, Wound ([id number])  His updated medication list for this problem includes:    Bactrim Ds 800-160 Mg Tabs (Sulfamethoxazole-trimethoprim) .Marland Kitchen... Take 1 tablet by mouth two times a day  Problem # 3:  PATENT FORAMEN OVALE (ICD-745.5) I have never rec'd records from cardiologist he saw we think he saw someone at [facility]. . .  [facility] in [place] has Dr. Eldridge Dace on report I believe he may have significant pulmonary HTN He  definitely has significantly elevated R heart pressures with a R to L shunt on TEE done last year He undoubtedly has some contribution from his COPD from smoking and his likely dx of Sleep Apnea I explained to him that he likely needs further eval.  I am attempting to get his records again. I will likely send him to Dr. Gala Romney for eval of poss pulmo HTN.  Will see if I can get his records first. I explained to him that his elevated R heart pressures are related to the PFO, smoking and OSA and that this is contributing to his LE edema.  He works in [employer] and he is likely injuring his legs leading to the cellulitis. He has polycythemia that the hematologist thinks is secondary to chronic hypoxia from sleep apnea.  I think his hypoxia is multifactorial as noted above.  Complete Medication List: 1)  Spiriva Handihaler 18 Mcg Caps (Tiotropium bromide monohydrate) .Marland Kitchen.. 1 inhalation daily 2)  Proventil Hfa 108 (90 Base) Mcg/act Aers (Albuterol sulfate) .Marland Kitchen.. 1-2 puffs every 4-6 hours as needed 3)  Aspirin 81 Mg Tabs (Aspirin) .... Once daily 4)  Bactrim Ds 800-160 Mg Tabs (Sulfamethoxazole-trimethoprim) .... Take 1 tablet by mouth two times a day  Patient Instructions: 1)  Need to request records again from Dr. Nicholos Johns and [facility].  Never received anything.  Need records on PFO, Chest CT, etc.  Please send as much as possible. 2)  Put bacitracin oint on wound two times a day. 3)  Keep area covered.  Keep it dry.  Keep it cleaned. 4)  Prop your leg up as much as possible. 5)  Take tylenol as needed for pain or fever. 6)  Return for follow up on Friday with Scott.  May put on Triage RN schedule if needed. 7)  If you run a fever of 101 or higher, the leg starts to look worse, you develop worse pain, chills, nausea, lightheadedness, etc., to the Emergency Room. Prescriptions: PROVENTIL HFA 108 (90 BASE) MCG/ACT AERS (ALBUTEROL SULFATE) 1-2 puffs every 4-6 hours as needed  #1 x 11    Entered and Authorized by:   Tereso Newcomer PA-C   Signed by:   Tereso Newcomer PA-C on 11/17/2009   Method used:   Print then Give to Patient   RxID:   [id number] SPIRIVA HANDIHALER 18 MCG CAPS (TIOTROPIUM BROMIDE MONOHYDRATE) 1 inhalation daily  #1 x 5   Entered and Authorized by:   Tereso Newcomer PA-C   Signed by:   Tereso Newcomer PA-C on 11/17/2009   Method used:   Print then Give to Patient   RxID:   [id number] BACTRIM DS  800-160 MG TABS (SULFAMETHOXAZOLE-TRIMETHOPRIM) Take 1 tablet by mouth two times a day  #20 x 0   Entered and Authorized by:   Tereso Newcomer PA-C   Signed by:   Tereso Newcomer PA-C on 11/17/2009   Method used:   Print then Give to Patient   RxID:   [id number]

## 2010-03-29 NOTE — Letter (Signed)
Summary: *HSN Results Follow up  [facility]  [address] [place], [place] [address]   Phone: [phone]  Fax: [phone]      11/23/2009   AVANTE CARNEIRO [address] [place], [place]  [address]   Dear  Mr. Bronc Harcum,                            ____S.Drinkard,FNP   ____D. Gore,FNP       ____B. McPherson,MD   ____V. Rankins,MD    ____E. Mulberry,MD    ____N. Daphine Deutscher, FNP  ____D. Reche Dixon, MD    ____K. Philipp Deputy, MD    ____Other     This letter is to inform you that your recent test(s):  _______Pap Smear    _______Lab Test     _______X-ray   __X____Vascular     ___X____ is within acceptable limits  _______ requires a medication change  _______ requires a follow-up lab visit  _______ requires a follow-up visit with your provider   Comments: You do not have any clots in your leg.       _________________________________________________________ If you have any questions, please contact our office                     Sincerely,  Armenia Shannon [facility]

## 2010-03-29 NOTE — Assessment & Plan Note (Signed)
Summary: FU ON FRIDAY////KT  Nurse Visit  CC: f/u cellulitis Is Patient Diabetic? No   Primary Care Provider:  Tereso Newcomer, PA-C  CC:  f/u cellulitis.  History of Present Illness: Had cellulitis, here for f/u from office visit 11/17/09.  States has been taking antibiotics since yesterday and changing dresses to area on right anterior lower extremity.  Is here for f/u to check leg.    Patient Instructions: 1)  Seen by Wende Mott 2)  Continue taking antibiotics until complete 3)  Continue applying antibiotic cream and changing bandage as you've been doing. 4)  Make appt. for one week to see Wende Mott for follow-up. 5)  We'll make venous duplex appt. earlier.  Make sure you get that done.   Impression & Recommendations:  Problem # 1:  CELLULITIS AND ABSCESS OF LEG EXCEPT FOOT (ICD-682.6)  Continue with antibiotics until compelte Venous duplex appt. made for 11/19/09.  His updated medication list for this problem includes:    Bactrim Ds 800-160 Mg Tabs (Sulfamethoxazole-trimethoprim) .Marland Kitchen... Take 1 tablet by mouth two times a day  Complete Medication List: 1)  Spiriva Handihaler 18 Mcg Caps (Tiotropium bromide monohydrate) .Marland Kitchen.. 1 inhalation daily 2)  Proventil Hfa 108 (90 Base) Mcg/act Aers (Albuterol sulfate) .Marland Kitchen.. 1-2 puffs every 4-6 hours as needed 3)  Aspirin 81 Mg Tabs (Aspirin) .... Once daily 4)  Bactrim Ds 800-160 Mg Tabs (Sulfamethoxazole-trimethoprim) .... Take 1 tablet by mouth two times a day   Physical Exam  Skin:  edema, erythema, ruddy, and hyperpigmented.  lower right leg has numerous dried, scaly areas, with dried blisters scattered.  Has one open area to anterior leg, draining small amount of serosanguinous fluid.   Review of Systems Derm:  Complains of dryness, lesion(s), and poor wound healing; right lower leg has one open area that is draining small amounts of blood.  Has several other blistered areas that are drying.  States leg has improved and feels  better..   Allergies: No Known Drug Allergies  Orders Added: 1)  Est. Patient Level I [54098]

## 2010-03-29 NOTE — Progress Notes (Signed)
Summary: Refer to pulmonology  Phone Note Outgoing Call   Summary of Call: Received sleep study report. He has severe sleep apnea. They were unable to adjust the CPAP adequately during his sleep study and his oxygen levels were extremely low. He needs to see pulmonology.  This is very important. Referral is in system. He needs evaluation for hypoxemia and assistance in treating sleep apnea. Please notify patient and then send to Arna Medici.  Initial call taken by: Brynda Rim,  December 14, 2009 3:18 PM  Follow-up for Phone Call        Left message on answering machine for pt to call back.Marland KitchenMarland KitchenMarland KitchenArmenia Shannon  December 14, 2009 4:01 PM   Additional Follow-up for Phone Call Additional follow up Details #1::        MR Decou RETURNED YOUR CALL FROM YESTERDAY, AND SAYS TO CALL HIM ON HIS CELL AT [phone].Cala Bradford Tinnin  December 15, 2009 8:47 AM   New Problems: SLEEP APNEA, OBSTRUCTIVE (ICD-327.23)   Additional Follow-up for Phone Call Additional follow up Details #2::    Arna Medici please refer to Pulmonology for hypoxemia and sleep apnea.  Follow-up by: Tereso Newcomer PA-C,  December 15, 2009 9:10 AM  Additional Follow-up for Phone Call Additional follow up Details #3:: Details for Additional Follow-up Action Taken: PT HAVE AN APPT 01-14-10 @ 2 PM  [facility]  DR FOOD  PT AWARE OF HIS APPT  Additional Follow-up by: Cheryll Dessert,  December 15, 2009 2:35 PM  New Problems: SLEEP APNEA, OBSTRUCTIVE (ICD-327.23)    Impression & Recommendations:  Problem # 1:  SLEEP APNEA, OBSTRUCTIVE (ICD-327.23)  refer to pulmonology  Orders: Pulmonary Referral (Pulmonary)  Problem # 2:  HYPOXEMIA (ICD-799.02)  spoke with Dr. Gala Romney of [facility] patient needs to see pulmonology to help get him oxygenated this will be key in getting him appropriately treated for sleep apnea as well will notify Dr. Myna Hidalgo as well referral made  Orders: Pulmonary Referral (Pulmonary)  Complete  Medication List: 1)  Spiriva Handihaler 18 Mcg Caps (Tiotropium bromide monohydrate) .Marland Kitchen.. 1 inhalation daily 2)  Proventil Hfa 108 (90 Base) Mcg/act Aers (Albuterol sulfate) .Marland Kitchen.. 1-2 puffs every 4-6 hours as needed 3)  Aspirin 81 Mg Tabs (Aspirin) .... Once daily   Past History:  Past Medical History: Current Problems:  PERIPHERAL VASCULAR DISEASE (ICD-443.9)  -  patient denis TOBACCO ABUSE (ICD-305.1) OBESITY (ICD-278.00) COPD Hx of BRONCHITIS (ICD-491.20) Polycythemia    a.  Dr. Myna Hidalgo . . . JAK-2 analysis is negative   b.  likely physiologic secondary to chronic hypoxia (? OSA + COPD + obesity)   c.  s/p phlebotomy x 1 TEE (echo) 2010:   a.  EF 55-60%; patent foramen ovale with R to L shunt; evidence of elevated right pressures Sleep Apnea   a. Mod. severe by sleep study 9.30.2011:  AHI 44.8 per hour; oxygen desat to a nadir of 65%; unsuccessful CPAP titration; significant hypoxemia even with supplemental O2 at 3LPM (desat to 70-80%); needs eval for cardiopulmonary disease and upper airway obstruction

## 2010-03-29 NOTE — Letter (Signed)
Summary: NONINVASIVE VASCULAR LAB  NONINVASIVE VASCULAR LAB   Imported By: Arta Bruce 12/14/2009 15:32:53  _____________________________________________________________________  External Attachment:    Type:   Image     Comment:   External Document

## 2010-03-31 NOTE — Letter (Signed)
Summary: [facility]//OFFICE NOTE  [facility]//OFFICE NOTE   Imported By: Arta Bruce 02/07/2010 13:55:35  _____________________________________________________________________  External Attachment:    Type:   Image     Comment:   External Document

## 2010-04-01 ENCOUNTER — Telehealth (INDEPENDENT_AMBULATORY_CARE_PROVIDER_SITE_OTHER): Payer: Self-pay | Admitting: Nurse Practitioner

## 2010-04-01 NOTE — Letter (Signed)
Summary: MAILED REQUESTED RECORDS TO THE [facility]  MAILED REQUESTED RECORDS TO THE [facility]   Imported By: Arta Bruce 01/07/2010 12:19:45  _____________________________________________________________________  External Attachment:    Type:   Image     Comment:   External Document

## 2010-04-08 ENCOUNTER — Encounter (INDEPENDENT_AMBULATORY_CARE_PROVIDER_SITE_OTHER): Payer: Self-pay | Admitting: Nurse Practitioner

## 2010-04-14 NOTE — Letter (Signed)
Summary: Generic Letter  [facility]  [address] [place], [place] [address]   Phone: [phone]  Fax: [phone]        04/08/2010  Billy [name] [address] [place], [place]  [address]  Dear [name],  We have been unable to contact you by telephone.  Please call our office, at your earliest convenience, so that we may speak with you regarding your recent phone call to this office.  Sincerely,   Dutch Quint RN

## 2010-04-14 NOTE — Progress Notes (Signed)
Summary: requesting a antibiotic  Phone Note Call from Patient   Summary of Call: pt says he has a cold and he feels as if he need antiobotics before his sypmtoms get worst and his cold turns to brochnitis.... [facility]... pt says he doesn't need anything pricey... pt says please give him a call Initial call taken by: Armenia Shannon,  April 01, 2010 9:07 AM  Follow-up for Phone Call        Left message on answering machine for pt. to return call.  Dutch Quint RN  April 05, 2010 3:01 PM  Left message on answering machine for pt. to return call.  Dutch Quint RN  April 06, 2010 11:22 AM  Left message on answering machine for pt. to return call.  Dutch Quint RN  April 07, 2010 11:09 AM  Letter sent.  Dutch Quint RN  April 08, 2010 12:22 PM

## 2010-04-15 ENCOUNTER — Encounter (HOSPITAL_BASED_OUTPATIENT_CLINIC_OR_DEPARTMENT_OTHER): Payer: Self-pay | Admitting: Hematology & Oncology

## 2010-04-15 ENCOUNTER — Other Ambulatory Visit: Payer: Self-pay | Admitting: Hematology & Oncology

## 2010-04-15 DIAGNOSIS — D696 Thrombocytopenia, unspecified: Secondary | ICD-10-CM

## 2010-04-15 DIAGNOSIS — D751 Secondary polycythemia: Secondary | ICD-10-CM

## 2010-04-15 DIAGNOSIS — G4730 Sleep apnea, unspecified: Secondary | ICD-10-CM

## 2010-04-15 LAB — MANUAL DIFFERENTIAL (CHCC SATELLITE): PLT EST ~~LOC~~: DECREASED

## 2010-04-15 LAB — CBC WITH DIFFERENTIAL (CANCER CENTER ONLY)
HCT: 68.5 % — ABNORMAL HIGH (ref 38.7–49.9)
MCH: 35.2 pg — ABNORMAL HIGH (ref 28.0–33.4)
MCHC: 32.5 g/dL (ref 32.0–35.9)
MCV: 108 fL — ABNORMAL HIGH (ref 82–98)
RDW: 11.6 % (ref 10.5–14.6)

## 2010-05-15 LAB — URINALYSIS, ROUTINE W REFLEX MICROSCOPIC
Bilirubin Urine: NEGATIVE
Glucose, UA: NEGATIVE mg/dL
Ketones, ur: NEGATIVE mg/dL
Leukocytes, UA: NEGATIVE
Protein, ur: 30 mg/dL — AB
pH: 7.5 (ref 5.0–8.0)

## 2010-05-15 LAB — COMPREHENSIVE METABOLIC PANEL
ALT: 17 U/L (ref 0–53)
AST: 17 U/L (ref 0–37)
AST: 18 U/L (ref 0–37)
AST: 18 U/L (ref 0–37)
CO2: 33 mEq/L — ABNORMAL HIGH (ref 19–32)
CO2: 35 mEq/L — ABNORMAL HIGH (ref 19–32)
Calcium: 8.3 mg/dL — ABNORMAL LOW (ref 8.4–10.5)
Calcium: 8.7 mg/dL (ref 8.4–10.5)
Calcium: 9 mg/dL (ref 8.4–10.5)
Creatinine, Ser: 0.86 mg/dL (ref 0.4–1.5)
Creatinine, Ser: 0.9 mg/dL (ref 0.4–1.5)
GFR calc Af Amer: >60 mL/min (ref >60)
GFR calc Af Amer: >60 mL/min (ref >60)
GFR calc Af Amer: >60 mL/min (ref >60)
GFR calc non Af Amer: >60 mL/min (ref >60)
GFR calc non Af Amer: >60 mL/min (ref >60)
Glucose, Bld: 91 mg/dL (ref 70–99)
Sodium: 140 mEq/L (ref 135–145)
Sodium: 142 mEq/L (ref 135–145)
Total Protein: 5.6 g/dL — ABNORMAL LOW (ref 6.0–8.3)
Total Protein: 6.4 g/dL (ref 6.0–8.3)

## 2010-05-15 LAB — RETICULOCYTES
Retic Count, Absolute: 77.5 10*3/uL (ref 19.0–186.0)
Retic Ct Pct: 1.3 % (ref 0.4–3.1)

## 2010-05-15 LAB — CBC
HCT: 65.2 % — ABNORMAL HIGH (ref 39.0–52.0)
HCT: 66.5 % — ABNORMAL HIGH (ref 39.0–52.0)
Hemoglobin: 21.5 g/dL (ref 13.0–17.0)
Hemoglobin: 23.2 g/dL (ref 13.0–17.0)
MCH: 34.5 pg — ABNORMAL HIGH (ref 26.0–34.0)
MCH: 35 pg — ABNORMAL HIGH (ref 26.0–34.0)
MCH: 35 pg — ABNORMAL HIGH (ref 26.0–34.0)
MCH: 35.3 pg — ABNORMAL HIGH (ref 26.0–34.0)
MCHC: 31.9 g/dL (ref 30.0–36.0)
MCHC: 32.3 g/dL (ref 30.0–36.0)
MCHC: 33.1 g/dL (ref 30.0–36.0)
MCV: 109 fL — ABNORMAL HIGH (ref 78.0–100.0)
Platelets: 66 10*3/uL — ABNORMAL LOW (ref 150–400)
Platelets: 69 10*3/uL — ABNORMAL LOW (ref 150–400)
RBC: 5.99 MIL/uL — ABNORMAL HIGH (ref 4.22–5.81)
RDW: 15.5 % (ref 11.5–15.5)
RDW: 15.6 % — ABNORMAL HIGH (ref 11.5–15.5)
RDW: 15.7 % — ABNORMAL HIGH (ref 11.5–15.5)
RDW: 15.7 % — ABNORMAL HIGH (ref 11.5–15.5)
WBC: 8.9 10*3/uL (ref 4.0–10.5)

## 2010-05-15 LAB — DIFFERENTIAL
Lymphocytes Relative: 19 % (ref 12–46)
Lymphs Abs: 1.4 10*3/uL (ref 0.7–4.0)
Monocytes Absolute: 0.6 10*3/uL (ref 0.1–1.0)
Monocytes Relative: 8 % (ref 3–12)
Neutro Abs: 5.5 10*3/uL (ref 1.7–7.7)

## 2010-05-15 LAB — URINE CULTURE: Culture: NO GROWTH

## 2010-05-15 LAB — CULTURE, BLOOD (ROUTINE X 2): Culture: NO GROWTH

## 2010-05-15 LAB — TSH: TSH: 1.005 u[IU]/mL (ref 0.350–4.500)

## 2010-05-15 LAB — CK: Total CK: 76 U/L (ref 7–232)

## 2010-05-15 LAB — LACTATE DEHYDROGENASE: LDH: 224 U/L (ref 94–250)

## 2010-05-15 LAB — RAPID URINE DRUG SCREEN, HOSP PERFORMED
Amphetamines: NOT DETECTED
Barbiturates: NOT DETECTED
Benzodiazepines: NOT DETECTED
Cocaine: NOT DETECTED
Opiates: POSITIVE — AB

## 2010-05-15 LAB — APTT: aPTT: 33 seconds (ref 24–37)

## 2010-05-15 LAB — URINE MICROSCOPIC-ADD ON

## 2010-05-15 LAB — VITAMIN B12: Vitamin B-12: 365 pg/mL (ref 211–911)

## 2010-05-15 LAB — PROTIME-INR: INR: 1.05 (ref 0.00–1.49)

## 2010-05-15 LAB — FERRITIN: Ferritin: 44 ng/mL (ref 22–322)

## 2010-05-26 ENCOUNTER — Inpatient Hospital Stay (INDEPENDENT_AMBULATORY_CARE_PROVIDER_SITE_OTHER)
Admission: RE | Admit: 2010-05-26 | Discharge: 2010-05-26 | Disposition: A | Discharge status: Home / Self Care | Payer: Self-pay | Source: Ambulatory Visit

## 2010-05-26 DIAGNOSIS — L02419 Cutaneous abscess of limb, unspecified: Secondary | ICD-10-CM

## 2010-05-26 DIAGNOSIS — L03119 Cellulitis of unspecified part of limb: Secondary | ICD-10-CM

## 2010-05-30 ENCOUNTER — Inpatient Hospital Stay (INDEPENDENT_AMBULATORY_CARE_PROVIDER_SITE_OTHER)
Admission: RE | Admit: 2010-05-30 | Discharge: 2010-05-30 | Disposition: A | Discharge status: Home / Self Care | Payer: Self-pay | Source: Ambulatory Visit | Attending: Family Medicine | Admitting: Family Medicine

## 2010-05-30 ENCOUNTER — Emergency Department (HOSPITAL_COMMUNITY): Payer: Medicaid Other

## 2010-05-30 ENCOUNTER — Inpatient Hospital Stay (HOSPITAL_COMMUNITY)
Admission: EM | Admit: 2010-05-30 | Discharge: 2010-06-03 | DRG: 155 | Disposition: A | Discharge status: Home / Self Care | Payer: Medicaid Other | Attending: Internal Medicine | Admitting: Internal Medicine

## 2010-05-30 DIAGNOSIS — Z9981 Dependence on supplemental oxygen: Secondary | ICD-10-CM

## 2010-05-30 DIAGNOSIS — F17200 Nicotine dependence, unspecified, uncomplicated: Secondary | ICD-10-CM | POA: Diagnosis present

## 2010-05-30 DIAGNOSIS — I808 Phlebitis and thrombophlebitis of other sites: Secondary | ICD-10-CM | POA: Diagnosis not present

## 2010-05-30 DIAGNOSIS — I2789 Other specified pulmonary heart diseases: Secondary | ICD-10-CM | POA: Diagnosis present

## 2010-05-30 DIAGNOSIS — R230 Cyanosis: Secondary | ICD-10-CM

## 2010-05-30 DIAGNOSIS — Z7982 Long term (current) use of aspirin: Secondary | ICD-10-CM

## 2010-05-30 DIAGNOSIS — G4733 Obstructive sleep apnea (adult) (pediatric): Principal | ICD-10-CM | POA: Diagnosis present

## 2010-05-30 DIAGNOSIS — Q2111 Secundum atrial septal defect: Secondary | ICD-10-CM

## 2010-05-30 DIAGNOSIS — J4489 Other specified chronic obstructive pulmonary disease: Secondary | ICD-10-CM | POA: Diagnosis present

## 2010-05-30 DIAGNOSIS — L03119 Cellulitis of unspecified part of limb: Secondary | ICD-10-CM | POA: Diagnosis present

## 2010-05-30 DIAGNOSIS — D45 Polycythemia vera: Secondary | ICD-10-CM | POA: Diagnosis present

## 2010-05-30 DIAGNOSIS — L02419 Cutaneous abscess of limb, unspecified: Secondary | ICD-10-CM | POA: Diagnosis present

## 2010-05-30 DIAGNOSIS — Q211 Atrial septal defect: Secondary | ICD-10-CM

## 2010-05-30 DIAGNOSIS — J449 Chronic obstructive pulmonary disease, unspecified: Secondary | ICD-10-CM | POA: Diagnosis present

## 2010-05-30 LAB — PROTIME-INR
INR: 1.19 (ref 0.00–1.49)
Prothrombin Time: 15.3 s — ABNORMAL HIGH (ref 11.6–15.2)

## 2010-05-30 LAB — URINALYSIS, ROUTINE W REFLEX MICROSCOPIC
Bilirubin Urine: NEGATIVE
Glucose, UA: NEGATIVE mg/dL
Specific Gravity, Urine: 1.016 (ref 1.005–1.030)
Urobilinogen, UA: 0.2 mg/dL (ref 0.0–1.0)

## 2010-05-30 LAB — CBC
MCH: 32.7 pg (ref 26.0–34.0)
MCHC: 32.2 g/dL (ref 30.0–36.0)
MCV: 101.6 fL — ABNORMAL HIGH (ref 78.0–100.0)
Platelets: 100 10*3/uL — ABNORMAL LOW (ref 150–400)

## 2010-05-30 LAB — COMPREHENSIVE METABOLIC PANEL WITH GFR
ALT: 19 U/L (ref 0–53)
AST: 27 U/L (ref 0–37)
Albumin: 3.6 g/dL (ref 3.5–5.2)
Alkaline Phosphatase: 58 U/L (ref 39–117)
BUN: 13 mg/dL (ref 6–23)
CO2: 35 meq/L — ABNORMAL HIGH (ref 19–32)
Calcium: 8.6 mg/dL (ref 8.4–10.5)
Chloride: 96 meq/L (ref 96–112)
Creatinine, Ser: 0.83 mg/dL (ref 0.4–1.5)
GFR calc non Af Amer: >60 mL/min
Glucose, Bld: 94 mg/dL (ref 70–99)
Potassium: 4.7 meq/L (ref 3.5–5.1)
Sodium: 138 meq/L (ref 135–145)
Total Bilirubin: 1.5 mg/dL — ABNORMAL HIGH (ref 0.3–1.2)
Total Protein: 5.9 g/dL — ABNORMAL LOW (ref 6.0–8.3)

## 2010-05-30 LAB — DIFFERENTIAL
Basophils Relative: 1 % (ref 0–1)
Eosinophils Absolute: 0.1 10*3/uL (ref 0.0–0.7)
Eosinophils Relative: 1 % (ref 0–5)
Lymphs Abs: 1.4 10*3/uL (ref 0.7–4.0)
Monocytes Absolute: 0.7 10*3/uL (ref 0.1–1.0)
Monocytes Relative: 9 % (ref 3–12)
Neutrophils Relative %: 69 % (ref 43–77)

## 2010-05-30 LAB — POCT CARDIAC MARKERS
CKMB, poc: <1 ng/mL — ABNORMAL LOW (ref 1.0–8.0)
Myoglobin, poc: 41.4 ng/mL (ref 12–200)
Troponin i, poc: <0.05 ng/mL (ref 0.00–0.09)

## 2010-05-30 LAB — BRAIN NATRIURETIC PEPTIDE: Pro B Natriuretic peptide (BNP): 30 pg/mL (ref 0.0–100.0)

## 2010-05-30 LAB — URINE MICROSCOPIC-ADD ON

## 2010-05-31 DIAGNOSIS — G4710 Hypersomnia, unspecified: Secondary | ICD-10-CM

## 2010-05-31 DIAGNOSIS — J449 Chronic obstructive pulmonary disease, unspecified: Secondary | ICD-10-CM

## 2010-05-31 DIAGNOSIS — G4730 Sleep apnea, unspecified: Secondary | ICD-10-CM

## 2010-05-31 DIAGNOSIS — I2789 Other specified pulmonary heart diseases: Secondary | ICD-10-CM

## 2010-06-01 ENCOUNTER — Inpatient Hospital Stay (HOSPITAL_COMMUNITY): Payer: Medicaid Other

## 2010-06-01 LAB — DIFFERENTIAL
Band Neutrophils: 0 % (ref 0–10)
Basophils Relative: 0 % (ref 0–1)
Blasts: 0 %
Lymphocytes Relative: 11 % — ABNORMAL LOW (ref 12–46)
Lymphs Abs: 0.9 10*3/uL (ref 0.7–4.0)
Monocytes Absolute: 1 10*3/uL (ref 0.1–1.0)
Monocytes Relative: 12 % (ref 3–12)
Neutro Abs: 6.2 10*3/uL (ref 1.7–7.7)
Neutrophils Relative %: 75 % (ref 43–77)
Promyelocytes Absolute: 0 %

## 2010-06-01 LAB — CBC
HCT: 68.2 % — ABNORMAL HIGH (ref 39.0–52.0)
Hemoglobin: 20.7 g/dL — ABNORMAL HIGH (ref 13.0–17.0)
MCH: 32.1 pg (ref 26.0–34.0)
MCHC: 30.4 g/dL (ref 30.0–36.0)
RDW: 15.8 % — ABNORMAL HIGH (ref 11.5–15.5)

## 2010-06-02 LAB — HEMOGLOBINOPATHY EVALUATION
Hgb A: 97.9 % — ABNORMAL HIGH (ref 96.8–97.8)
Hgb S Quant: 0 % (ref 0.0–0.0)

## 2010-06-03 LAB — BLOOD GAS, ARTERIAL
Drawn by: 242311
FIO2: 0.21 %
pCO2 arterial: 63.7 mmHg (ref 35.0–45.0)
pH, Arterial: 7.338 — ABNORMAL LOW (ref 7.350–7.450)
pO2, Arterial: 50.3 mmHg — ABNORMAL LOW (ref 80.0–100.0)

## 2010-06-03 NOTE — H&P (Signed)
Billy [name], Billy [name]               ACCOUNT NO.:  [id number]  MEDICAL RECORD NO.:  [id number]           PATIENT TYPE:  E  LOCATION:  [facility]                         FACILITY:  [facility]  PHYSICIAN:  Houston Siren, MD           DATE OF BIRTH:  [birth year]  DATE OF ADMISSION:  05/30/2010 DATE OF DISCHARGE:                             HISTORY & PHYSICAL   PRIMARY CARE PHYSICIAN:  Unassigned.  REASON FOR ADMISSION:  Shortness of breath, bilateral pedal edema and polycythemia.  ADVANCE DIRECTIVE:  Full code.  HISTORY OF PRESENT ILLNESS:  This is a 40 year old male with history of known polycythemia, unclear if it is primary or secondary, as he has refused bone marrow before and with history of tobacco use along with severe sleep apnea and chronic hypoxemia, presents to the emergency room complaining of increased swelling of his lower extremities, short of breath and hypoxemic.  Review of his records showed that he had a polysomnogram done, and it shows severe sleep apnea and evidence of underlying cardiopulmonary disease.  I also found a transesophageal echo done in October 2010 with report of a patent foramen ovale with right to left shunt and increased pulmonary pressure.  Workup in the emergency room showed hemoglobin of 22 with a crit of 68 and MCV of 102.  He does have normal creatinine and a BNP of 30.  His chest x-ray showed cardiomegaly and mild bilateral pulmonary vascular congestion.  He has a normal white count, and his platelet count is 100,000.  Hospitalist was asked to admit the patient for further evaluation of his elevated hemoglobin/hematocrit and question of lower extremity cellulitis as well.  PAST MEDICAL HISTORY: 1. Obesity. 2. Polycythemia. 3. Asthma. 4. Sleep apnea.  SOCIAL HISTORY:  He does have children and is divorced.  He worked in [employer].  He does smoke marijuana and had been a heavy drinker.  He smokes about one and half pack per day for many  years.  ALLERGIES:  No known drug allergies.  CURRENT MEDICATIONS:  Aspirin 81 mg per day.  REVIEW OF SYSTEMS:  Otherwise, unremarkable.  His lower extremity chronic venous changes is actually not new.  He has no chest pain, orthopnea or PND.  PHYSICAL EXAMINATION:  VITAL SIGNS:  Blood pressure 122/72, pulse of 80, respiratory rate of 18, temperature 98.5 exam. GENERAL:  He is alert and oriented and conversing. HEENT:  Sclerae nonicteric.  He has no stridor.  He has central cyanosis with blue hue to his lips and tongue.  No JVD. CARDIAC:  S1-S2 regular, there is a soft 2/6 systolic ejection murmur at the left sternal border. LUNGS:  Diffuse inspiratory and expiratory wheezes but no crackles. ABDOMEN:  Obese, nondistended, nontender.  I did not appreciate ascites although this is difficult to fully evaluate. EXTREMITIES:  1+ edema.  He has chronic venous stasis changes and some erythema of his lower extremity. NEUROLOGICAL:  Nonfocal.  He does have slight blistering but no rash otherwise.  OBJECTIVE FINDINGS:  Troponin less than 0.05, white count of 7.2000, hemoglobin of 21.9, MCV of 102, platelet count of 100,000,  INR of 1.19. Urinalysis is negative.  Chest x-ray showed cardiomegaly with mild bilateral pulmonary venous congestion.  I did not see an EKG.  IMPRESSION:  This is a 40 year old male with polycythemia which I believe may have been secondary rather than polycythemia vera.  I will go ahead and get a JAK-2 but phlebotomy would then be contraindicated since it is a compensatory mechanism.  I believe that he does have true right-to-left shunt manifested with central cyanosis.  The TEE done in October 2010 already showed evidence of right-to-left shunt with increase pulmonary pressure.  He undoubtedly has severe sleep apnea as well. I have already consulted Cardiology for further recommendations, he may end up needing a cardiopulmonary transplant.  We will obviously defer  that consideration to Cardiology. He is a full code, will be admitted to [facility].  For his lower  extremity swelling and redness, it is possible that he has cellulitis and vancomycin intravenously is reasonable.  I will continue that medication.  He is stable will be given a 100% rebreather mask.     Houston Siren, MD     PL/MEDQ  D:  05/30/2010  T:  05/30/2010  Job:  [id number]  Electronically Signed by Houston Siren  on 06/03/2010 11:11:22 PM

## 2010-06-06 ENCOUNTER — Telehealth: Payer: Self-pay | Admitting: Emergency Medicine

## 2010-06-06 LAB — JAK2 GENOTYPR: JAK2 GenotypR: NOT DETECTED

## 2010-06-06 NOTE — Telephone Encounter (Signed)
Pt is coming in to see RB on 4/13 at 4:00. Pt aware of apt.

## 2010-06-07 LAB — IRON: Iron: 53 ug/dL (ref 42–135)

## 2010-06-07 LAB — DIFFERENTIAL
Basophils Absolute: 0 10*3/uL (ref 0.0–0.1)
Eosinophils Relative: 2 % (ref 0–5)
Lymphocytes Relative: 23 % (ref 12–46)
Neutro Abs: 5.7 10*3/uL (ref 1.7–7.7)
Neutrophils Relative %: 65 % (ref 43–77)

## 2010-06-07 LAB — CBC
HCT: 60.7 % — ABNORMAL HIGH (ref 39.0–52.0)
Platelets: 113 10*3/uL — ABNORMAL LOW (ref 150–400)
RDW: 14.3 % (ref 11.5–15.5)
WBC: 8.7 10*3/uL (ref 4.0–10.5)

## 2010-06-07 LAB — FERRITIN: Ferritin: 71 ng/mL (ref 22–322)

## 2010-06-09 ENCOUNTER — Encounter: Payer: Self-pay | Admitting: Emergency Medicine

## 2010-06-09 NOTE — Discharge Summary (Signed)
[name], [name]               ACCOUNT NO.:  [id number]  MEDICAL RECORD NO.:  [id number]           PATIENT TYPE:  I  LOCATION:  4730                         FACILITY:  [facility]  PHYSICIAN:  Rock Nephew, MD       DATE OF BIRTH:  [birth year]  DATE OF ADMISSION:  05/30/2010 DATE OF DISCHARGE:  06/03/2010                        DISCHARGE SUMMARY - REFERRING   PRIMARY CARE PHYSICIAN:  None.  We are in the process of establishing care with [facility] for the patient.  DISCHARGE DIAGNOSES: 1. Severe obstructive sleep apnea. 2. Pulmonary hypertension. 3. Hypoxemia, on home oxygen. 4. Cellulitis. 5. Phlebitis secondary to IV. 6. Severe chronic obstructive pulmonary disease. 7. Patent foramen ovale with right-to-left shunt, secondary to     pulmonary hypertension. 8. 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 9. Morbid obesity.  DISCHARGE MEDICATIONS:  The patient are as follows; 1. Albuterol 2 puffs inhaled every 4 hours as needed.2. Symbicort 2 puffs inhaled twice daily. 3. Nicotine patch 14 mg 24 hours transdermally daily. 4. Percocet 1-2 tablets by mouth every 6 hours as needed. 5. Aspirin 81 mg p.o. daily. 6. Doxycycline 100 mg p.o. twice daily for 8 days.  DIET:  The patient's diet should be low-calorie, low-fat.  PROCEDURES PERFORMED:  The patient had 2-view x-ray, which showed mild cardiomegaly with probable mild pulmonary vascular congestion.  CONSULTATIONS: 1. Dr. Leslye Peer, Pulmonary Medicine. 2. Dr. Corky Crafts, [facility]. 3. Dr. Rose Phi. Ennever, Hematology.  FOLLOWUP:  The patient should follow up with [facility].  On June 23, 2010, the patient should follow up with Levy Pupa in 1-2 weeks.  The patient should follow up with Dr. Arlan Organ in 1-2 weeks.  The patient should follow up with Dr. Catalina Gravel at [facility] in 4-6 weeks for right heart catheterization.  CHIEF COMPLAINT:  Shortness of breath and  polycythemia.  BRIEF HISTORY OF PRESENT ILLNESS:  This is a 40 year old male with a history of known polycythemia, unclear if it is primary or secondary as he has refused bone marrow aspiration before and history of tobacco abuse along with severe sleep apnea and chronic hypoxemia who presents to emergency room complaining of increased swelling of the lower extremity, shortness of breath and hypoxemia.  HOSPITAL COURSE: 1. Obstructive sleep apnea with pulmonary hypertension and hypoxemia.     The patient was placed on CPAP.  The patient was very drowsy and     after the CPAP use, the patient had marked improvement.  Patient     was also had a pulse oximetry, which the patient had severe     desaturation and needed 4 L of oxygen to keep his oxygenation level     adequate.  The patient was seen in consultation with [facility]     Cardiology as well as Levy Pupa.  The patient will need a right     heart cath as an outpatient.  The patient is also receiving     Symbicort as well as albuterol inhaler. 2. Cellulitis.  The patient has lower extremity cellulitis and also     developed some phlebitis on his elbows  bilaterally secondary to     IVs.  The patient to receive vancomycin in the hospital.  The     patient will be discharged home to complete 8-day course of     doxycycline. 3. Chronic obstructive pulmonary disease.  The patient is on home     oxygen.  Patent foramen ovale with a right-to-left shunt.  The     patient will need outpatient evaluation by Dr. Catalina Gravel. 4. Polycythemia.  The patient should continue phlebotomies with Dr.     Myna Hidalgo. 5. Morbid obesity, stable. 6. Deep venous thrombosis prophylaxis.  The patient is on heparin.     Also, the patient is given inhalers from the hospital and was     discharged.     Rock Nephew, MD     NH/MEDQ  D:  06/03/2010  T:  06/03/2010  Job:  [id number]  cc:   Corky Crafts, MD Rose Phi. Myna Hidalgo, M.D. Leslye Peer,  MD  Electronically Signed by Rock Nephew MD on 06/09/2010 08:36:07 PM

## 2010-06-10 ENCOUNTER — Inpatient Hospital Stay: Payer: Self-pay | Admitting: Emergency Medicine

## 2010-06-14 ENCOUNTER — Encounter: Payer: Self-pay | Admitting: Adult Health

## 2010-06-14 ENCOUNTER — Ambulatory Visit (INDEPENDENT_AMBULATORY_CARE_PROVIDER_SITE_OTHER): Payer: Self-pay | Admitting: Adult Health

## 2010-06-14 DIAGNOSIS — J449 Chronic obstructive pulmonary disease, unspecified: Secondary | ICD-10-CM

## 2010-06-14 DIAGNOSIS — G4733 Obstructive sleep apnea (adult) (pediatric): Secondary | ICD-10-CM

## 2010-06-14 NOTE — Patient Instructions (Signed)
MOST IMPORTANT GOAL IS TO STOP  SMOKING.  Wear Oxygen at all times at 3 l/m .  Wear CPAP at night as directed.  Begin Symbicort 160/4.38mcg 2 puffs Twice daily   Begin Spiriva daily -1 puff follow up Dr. Delton Coombes in 3 weeks and As needed

## 2010-06-14 NOTE — Progress Notes (Signed)
Subjective:    Patient ID: Billy [name], male    DOB: 1970/12/11, 40 y.o.   MRN: [id number]  HPI 40 yo male with initial pulmonary consult during hospitalization 05/30/10 for severe OSA, pulmonary HTN, hypoxemia   06/14/2010 Post Hospital OV Pt presents for a post hospital visit. Admitted 4/2-06/03/10 for increased swelling, dyspnea w/ hypoxemia. Has known hx of Polycythemia followed by Dr. Myna Hidalgo with hx of phelbotomies. Prior to admission he did not have a family doctor. Dr. Myna Hidalgo rx O2 in 02/2010 for desaturations. He was set up for sleep study by [facility] last year which showed severe OSA however pt says he did not start on CPAP- it is unclear why he did not start on CPAP.   During his hospitalization, his severe  OSA treated with CPAP .  He was informed to begin continuous O2 due to persistent desaturations.  However today on arrival to office he  left  O2 tank in car. Sat on arrival was -85-87% on room air. After rest he was 91%.   He also had cellulitis and phlebitis tx with IV antibitoics.    He was found to have a patent foramen ovale with a right to left shunt. Seen by Deboraha Sprang cards with plans for outpatient cath. And follow up    Since discharge he is feeling better with decreased dyspnea  And swelling.  Says he is wearing CPAP everynight -all night . He says he feels more rested and does not fall asleep during daytime anymore. Prior to CPAP has severe daytime sleepiness.  He does not have insurance. He has been recently approved for disability. He does have upcoming appointment with [facility]. He says he has just been approved for Medicaid thru May of this year.    Review of Systems Constitutional:   No  weight loss, night sweats,  Fevers, chills,    HEENT:   No headaches,  Difficulty swallowing,  Tooth/dental problems, or  Sore throat,                No sneezing, itching, ear ache, nasal congestion, post nasal drip,   CV:  No chest pain,  Orthopnea, PND,  , anasarca,  dizziness, palpitations, syncope.   GI  No heartburn, indigestion, abdominal pain, nausea, vomiting, diarrhea, change in bowel habits, loss of appetite, bloody stools.   Resp:   No excess mucus, no productive cough,  No non-productive cough,  No coughing up of blood.  No change in color of mucus.  No wheezing.  No chest wall deformity  Skin: no rash or lesions.  GU: no dysuria, change in color of urine, no urgency or frequency.  No flank pain, no hematuria   MS:  No joint pain or swelling.  No decreased range of motion.   Marland Kitchen  Psych:  No change in mood or affect. No depression or anxiety.  No memory loss.         Objective:   Physical Exam GEN: A/Ox3; pleasant , NAD, well nourished ,obese   HEENT:  [place]/AT,  EACs-clear, TMs-wnl, NOSE-clear, THROAT-clear, no lesions, no postnasal drip or exudate noted. , class 2-3 airway  NECK:  Supple w/ fair ROM; no JVD; normal carotid impulses w/o bruits; no thyromegaly or nodules palpated; no lymphadenopathy.  RESP  Clear  P & A; w/o, wheezes/ rales/ or rhonchi.no accessory muscle use, no dullness to percussion  CARD:  RRR, no m/r/g  , 1+ eripheral edema, pulses intact, no cyanosis or clubbing, venous insufficiency changes with  brawny skin Changes   GI:   Soft & nt; nml bowel sounds; no organomegaly or masses detected, obese abd.   Musco: Warm bil, no deformities or joint swelling noted.   Neuro: alert, no focal deficits noted.    Skin: Warm, no lesions or rashes         Assessment & Plan:

## 2010-06-14 NOTE — Assessment & Plan Note (Signed)
Continue on nocturnal CPAP and O2.  follow up Dr. Delton Coombes in 3 weeks

## 2010-06-14 NOTE — Assessment & Plan Note (Signed)
Will find PFTs from Hospital.  Advised on smoking cesstation  To begin symbicort and spiriva

## 2010-06-24 ENCOUNTER — Encounter (HOSPITAL_BASED_OUTPATIENT_CLINIC_OR_DEPARTMENT_OTHER): Payer: Medicaid Other | Admitting: Hematology & Oncology

## 2010-06-24 ENCOUNTER — Other Ambulatory Visit: Payer: Self-pay | Admitting: Hematology & Oncology

## 2010-06-24 DIAGNOSIS — G4730 Sleep apnea, unspecified: Secondary | ICD-10-CM

## 2010-06-24 DIAGNOSIS — D696 Thrombocytopenia, unspecified: Secondary | ICD-10-CM

## 2010-06-24 DIAGNOSIS — D751 Secondary polycythemia: Secondary | ICD-10-CM

## 2010-06-24 DIAGNOSIS — D72829 Elevated white blood cell count, unspecified: Secondary | ICD-10-CM

## 2010-06-24 LAB — CBC WITH DIFFERENTIAL (CANCER CENTER ONLY)
BASO%: 0.7 % (ref 0.0–2.0)
LYMPH#: 1.4 10*3/uL (ref 0.9–3.3)
LYMPH%: 24.1 % (ref 14.0–48.0)
MONO#: 0.7 10*3/uL (ref 0.1–0.9)
Platelets: 100 10*3/uL — ABNORMAL LOW (ref 145–400)
RDW: 18 % — ABNORMAL HIGH (ref 11.1–15.7)
WBC: 5.9 10*3/uL (ref 4.0–10.0)

## 2010-06-24 LAB — TECHNOLOGIST REVIEW CHCC SATELLITE

## 2010-06-26 NOTE — Consult Note (Signed)
Billy [name], Billy [name]               ACCOUNT NO.:  [id number]  MEDICAL RECORD NO.:  [id number]           PATIENT TYPE:  LOCATION:                                 FACILITY:  PHYSICIAN:  Wendi Snipes, MD DATE OF BIRTH:  10-Aug-1970  DATE OF CONSULTATION:  05/30/2010 DATE OF DISCHARGE:                                CONSULTATION   REQUESTING PHYSICIAN:  Houston Siren, MD  CARDIOLOGIST:  Corky Crafts, MD  PRIMARY CARE DOCTOR:  None.  REASON FOR CONSULTATION:  PFO and cyanosis.  CHIEF COMPLAINT:  Right lower extremity cellulitis.  HISTORY OF PRESENT ILLNESS:  This is a 40 year old gentleman with a history of tobacco abuse, obesity, polycythemia, hypoxia and obstructive sleep apnea who presents after a referral from acute care after he was seen for a followup of worsening cellulitis.  He was seen by doctor there and was discovered to be hypoxic and cyanotic and referred into the emergency department for further workup.  He states that he has had this condition for several years and has received an extensive workup in the past.  He otherwise states that he has been in his usual state of health and however, he states that his lower extremity edema is progressive over the past few months and he has noticed that his right lower leg has become more erythematous over this time period.  He also states that he has worsening daytime somnolence and falls asleep quite frequently within moments of sitting quietly.  He has been offered nocturnal polysomnograms in the past and did not tolerate the test at that time.  He did state that he has seen a hematologist in the past and his polycythemia was thought to be secondary from an unknown cause and he had received phlebotomies which did make him feel somewhat better over that period of time.  PAST MEDICAL HISTORY: 1. Polycythemia thought to be secondary. 2. Chronic hypoxia. 3. Obstructive sleep apnea. 4. Obesity. 5. Tobacco  abuse.  ALLERGIES:  No known drug allergies.  MEDICATIONS ON ADMISSION:  Aspirin 81 mg daily.  SOCIAL HISTORY:  He lives in [place].  He is divorced.  He has 2 sons.  He works in [employer].  He has smoked a pack and half per day for approximately 20 years.  FAMILY HISTORY:  Negative for premature coronary artery disease.  REVIEW OF SYSTEMS:  All 14 systems were reviewed and were negative except as mentioned detail in the HPI.  PHYSICAL EXAMINATION:  VITAL SIGNS:  Blood pressure is 141/72, respiratory rate is 16 and pulse is 110.  The pulse ox is not picking up.  His last saturation was 82% on room air. GENERAL:  He is a 40 year old obese white male-appearing his stated age. No acute distress. HEENT:  He had cyanotic marked mucous membranes, moist. NECK:  No jugular venous distention.  No thyromegaly. CARDIOVASCULAR:  Regular rate and rhythm.  No murmurs, rubs or gallops. LUNGS:  Diffuse scattered wheezing. ABDOMEN:  Nontender and nondistended.  Positive bowel sounds.  No masses. EXTREMITIES:  Edema 1+ to the knees with venous stasis on bilateral lower extremities with right worse  than the left.  NEUROLOGIC:  Alert and oriented x3.  Cranial nerves II through XII are grossly intact.  No focal neurologic deficits. SKIN:  Warm, dry and intact.  Chronic venous stasis as mentioned above. His skin appeared cyanotic with good capillary fill though easy blanching.  RADIOLOGY REVIEW:  Chest x-ray showed mild cardiomegaly with likely vascular congestion.  EKG showed normal sinus rhythm with a rate of 88 beats per minute and right atrial enlargement likely RVH with right axis deviation and incomplete right bundle-branch block.  LABORATORY REVIEW:  White cell count 7.2, hematocrit is 68, platelet count is 100, creatinine is 0.83.  Troponin is less than 0.05, BNP is 30, INR is 1.2.  ASSESSMENT AND PLAN:  This 40 year old with chronic cyanosis, hypoxia, reactive polycythemia and  lower extremity edema. Patent foramen ovale.  PFO would very unlikely be the culprit for enough right to left shunt to cause this degree of cyanosis.  However, his pulmonary pressures are very likely elevated and potentially could be caused by other forms of shunting.  He would benefit from repeat evaluation in this regard with a right heart catheterization with a short run and a relook for other right-to-left shunting potentially with a repeat transesophageal echocardiogram.  This has very likely been going on since birth and would not be surprised if there was some underlying congenital heart disease, though he has not been found by previous extensive workup.  If there is not congenital heart disease, then other causes of cyanotic polycythemia should be pursued at this time.     Wendi Snipes, MD     BHH/MEDQ  D:  05/30/2010  T:  05/31/2010  Job:  [id number]  Electronically Signed by Jim Desanctis MD on 06/26/2010 06:53:26 PM

## 2010-07-01 ENCOUNTER — Encounter: Payer: Self-pay | Admitting: Emergency Medicine

## 2010-07-05 ENCOUNTER — Ambulatory Visit (INDEPENDENT_AMBULATORY_CARE_PROVIDER_SITE_OTHER): Payer: Medicaid Other | Admitting: Emergency Medicine

## 2010-07-05 ENCOUNTER — Encounter: Payer: Self-pay | Admitting: Emergency Medicine

## 2010-07-05 VITALS — BP 112/74 | HR 84 | Temp 97.3°F | Ht 73.0 in | Wt 303.6 lb

## 2010-07-05 DIAGNOSIS — G4733 Obstructive sleep apnea (adult) (pediatric): Secondary | ICD-10-CM

## 2010-07-05 NOTE — Patient Instructions (Signed)
Continue to work on cutting down your cigarettes. We will arrange a stop date at some point in the future when you are ready.  Continue your Spiriva and Symbicort Use albuterol as needed We will perform a titration of your CPAP to see if it needs to be adjusted Wear your oxygen at all times, we will try to get a more portable system Follow up with Dr Delton Coombes in 3 months or sooner for any problems.

## 2010-07-05 NOTE — Progress Notes (Signed)
  Subjective:    Patient ID: Billy [name], male    DOB: 08-13-1970, 40 y.o.   MRN: [id number]  HPI 40 yo male with initial pulmonary consult during hospitalization 05/30/10 for severe OSA, pulmonary HTN, hypoxemia  06/14/2010 Post Hospital OV   Pt presents for a post hospital visit. Admitted 4/2-06/03/10 for increased swelling, dyspnea w/ hypoxemia. Has known hx of Polycythemia followed by Dr. Myna Hidalgo with hx of phelbotomies. Prior to admission he did not have a family doctor. Dr. Myna Hidalgo rx O2 in 02/2010 for desaturations. He was set up for sleep study by [facility] last year which showed severe OSA however pt says he did not start on CPAP- it is unclear why he did not start on CPAP.   During his hospitalization, his severe OSA treated with CPAP . He was informed to begin continuous O2 due to persistent desaturations.   However today on arrival to office he left O2 tank in car. Sat on arrival was -85-87% on room air. After rest he was 91%.  He also had cellulitis and phlebitis tx with IV antibitoics.   He was found to have a patent foramen ovale with a right to left shunt. Seen by Deboraha Sprang cards with plans for outpatient cath. And follow up  Since discharge he is feeling better with decreased dyspnea And swelling.   Says he is wearing CPAP everynight -all night . He says he feels more rested and does not fall asleep during daytime anymore. Prior to CPAP has severe daytime sleepiness.   He does not have insurance. He has been recently approved for disability. He does have upcoming appointment with [facility]. He says he has just been approved for Medicaid thru May of this year.   ROV 07/05/10 -- 40 yo m, obesity hypoventilation and OSA, severe COPD, polycythemia presumed due to chronic hypoxemia. Returns for f/u today. Has been very reliable with wearing CPAP. He is good about wearing his O2. [facility] been [facility] at [facility], hasn't needed to get his meds refilled yet. Not clear to me whether he has had  auto-titration. Wants to consider a portable concentrator vs liquid O2. Has had some cough last several days with green phlegm. No longer falling asleep during the day. No CP. Some exertional SOB. Smoking 0.5 pk a day.   Review of Systems As per HPI     Objective:   Physical Exam Gen: Pleasant, well-nourished, in no distress,  normal affect  ENT: No lesions,  mouth clear,  oropharynx clear, no postnasal drip  Neck: No JVD, no TMG, no carotid bruits  Lungs: distant, clear on normal resp, soft wheeze on forced exp.   Cardiovascular: RRR, heart sounds normal, no murmur or gallops, no peripheral edema  Musculoskeletal: No deformities, no cyanosis or clubbing  Neuro: alert, non focal  Skin: some chronic changes B LE, no cellulitis        Assessment & Plan:

## 2010-07-05 NOTE — Assessment & Plan Note (Signed)
CPAP auto-titration study to adjust pressure Need more portable O2 Spiriva + symbicort via [facility] ROV 3 months Smoking cessation

## 2010-07-05 NOTE — Progress Notes (Signed)
Addended by: Levy Pupa on: 07/05/2010 11:31 AM   Modules accepted: Orders

## 2010-08-08 ENCOUNTER — Encounter (HOSPITAL_BASED_OUTPATIENT_CLINIC_OR_DEPARTMENT_OTHER): Payer: Medicaid Other | Admitting: Hematology & Oncology

## 2010-08-08 ENCOUNTER — Other Ambulatory Visit: Payer: Self-pay | Admitting: Hematology & Oncology

## 2010-08-08 DIAGNOSIS — D751 Secondary polycythemia: Secondary | ICD-10-CM

## 2010-08-08 DIAGNOSIS — R0902 Hypoxemia: Secondary | ICD-10-CM

## 2010-08-08 DIAGNOSIS — E662 Morbid (severe) obesity with alveolar hypoventilation: Secondary | ICD-10-CM

## 2010-08-08 DIAGNOSIS — G4730 Sleep apnea, unspecified: Secondary | ICD-10-CM

## 2010-08-08 DIAGNOSIS — D696 Thrombocytopenia, unspecified: Secondary | ICD-10-CM

## 2010-08-08 LAB — CBC WITH DIFFERENTIAL (CANCER CENTER ONLY)
BASO%: 0.5 % (ref 0.0–2.0)
LYMPH#: 1.4 10*3/uL (ref 0.9–3.3)
LYMPH%: 24.6 % (ref 14.0–48.0)
MCV: 92 fL (ref 82–98)
MONO#: 0.6 10*3/uL (ref 0.1–0.9)
Platelets: 126 10*3/uL — ABNORMAL LOW (ref 145–400)
RDW: 19.3 % — ABNORMAL HIGH (ref 11.1–15.7)
WBC: 5.6 10*3/uL (ref 4.0–10.0)

## 2010-08-08 LAB — CHCC SATELLITE - SMEAR

## 2010-10-17 ENCOUNTER — Ambulatory Visit (INDEPENDENT_AMBULATORY_CARE_PROVIDER_SITE_OTHER): Payer: Medicaid Other | Admitting: Emergency Medicine

## 2010-10-17 ENCOUNTER — Encounter: Payer: Self-pay | Admitting: Emergency Medicine

## 2010-10-17 DIAGNOSIS — G4733 Obstructive sleep apnea (adult) (pediatric): Secondary | ICD-10-CM

## 2010-10-17 DIAGNOSIS — J449 Chronic obstructive pulmonary disease, unspecified: Secondary | ICD-10-CM

## 2010-10-17 NOTE — Assessment & Plan Note (Signed)
Continue CPAP every night + 3L/min O2

## 2010-10-17 NOTE — Progress Notes (Signed)
  Subjective:    Patient ID: Billy [name], male    DOB: 1970/09/10, 40 y.o.   MRN: [id number]  HPI 40 yo male with initial pulmonary consult during hospitalization 05/30/10 for severe OSA, pulmonary HTN, hypoxemia  06/14/2010 Post Hospital OV   Pt presents for a post hospital visit. Admitted 4/2-06/03/10 for increased swelling, dyspnea w/ hypoxemia. Has known hx of Polycythemia followed by Dr. Myna Hidalgo with hx of phelbotomies. Prior to admission he did not have a family doctor. Dr. Myna Hidalgo rx O2 in 02/2010 for desaturations. He was set up for sleep study by [facility] last year which showed severe OSA however pt says he did not start on CPAP- it is unclear why he did not start on CPAP.   During his hospitalization, his severe OSA treated with CPAP . He was informed to begin continuous O2 due to persistent desaturations.   However today on arrival to office he left O2 tank in car. Sat on arrival was -85-87% on room air. After rest he was 91%.  He also had cellulitis and phlebitis tx with IV antibitoics.   He was found to have a patent foramen ovale with a right to left shunt. Seen by Deboraha Sprang cards with plans for outpatient cath. And follow up  Since discharge he is feeling better with decreased dyspnea And swelling.   Says he is wearing CPAP everynight -all night . He says he feels more rested and does not fall asleep during daytime anymore. Prior to CPAP has severe daytime sleepiness.   He does not have insurance. He has been recently approved for disability. He does have upcoming appointment with [facility]. He says he has just been approved for Medicaid thru May of this year.   ROV 07/05/10 -- 40 yo m, obesity hypoventilation and OSA, severe COPD, polycythemia presumed due to chronic hypoxemia. Returns for f/u today. Has been very reliable with wearing CPAP. He is good about wearing his O2. [facility], hasn't needed to get his meds refilled yet. Not clear to me whether he has had  auto-titration. Wants to consider a portable concentrator vs liquid O2. Has had some cough last several days with green phlegm. No longer falling asleep during the day. No CP. Some exertional SOB. Smoking 0.5 pk a day.   ROV 10/17/10 -- OSA/OHS, severe COPD with associated hypoxemia and polycythemia. Continues to smoke 3/4 pack a day. He has been using CPAP every night, daytime sx are much improved, but he still has daily HA. Since last visit he was changed to pulsed flow 2L/min. NO exacerbations. C/o impotence.   Review of Systems As per HPI    Objective:   Physical Exam Gen: Pleasant, well-nourished, in no distress,  normal affect  ENT: No lesions,  mouth clear,  oropharynx clear, no postnasal drip  Neck: No JVD, no TMG, no carotid bruits  Lungs: distant, clear on normal resp, soft wheeze on forced exp.   Cardiovascular: RRR, heart sounds normal, no murmur or gallops, no peripheral edema  Musculoskeletal: No deformities, no cyanosis or clubbing  Neuro: alert, non focal  Skin: some chronic changes B LE, no cellulitis  Assessment & Plan:  COPD Continue spiriva + symbicort O2 - walking oximetry   SLEEP APNEA, OBSTRUCTIVE Continue CPAP every night + 3L/min O2

## 2010-10-17 NOTE — Patient Instructions (Addendum)
Continue your Spiriva and Symbicort Walking oximetry today shows that you still ned to wear you O2 with significant exertion.  You need to stop smoking. We want to help you, please let us know when you are ready to stop.  Follow up with Dr Delton Coombes in 6 months or sooner if you have any problems.

## 2010-10-17 NOTE — Assessment & Plan Note (Signed)
Continue spiriva + symbicort O2 - walking oximetry

## 2010-12-01 ENCOUNTER — Other Ambulatory Visit: Payer: Self-pay | Admitting: Hematology & Oncology

## 2010-12-01 ENCOUNTER — Encounter (HOSPITAL_BASED_OUTPATIENT_CLINIC_OR_DEPARTMENT_OTHER): Payer: Self-pay | Admitting: Hematology & Oncology

## 2010-12-01 DIAGNOSIS — G4730 Sleep apnea, unspecified: Secondary | ICD-10-CM

## 2010-12-01 DIAGNOSIS — R0902 Hypoxemia: Secondary | ICD-10-CM

## 2010-12-01 DIAGNOSIS — D696 Thrombocytopenia, unspecified: Secondary | ICD-10-CM

## 2010-12-01 DIAGNOSIS — E662 Morbid (severe) obesity with alveolar hypoventilation: Secondary | ICD-10-CM

## 2010-12-01 DIAGNOSIS — Z9981 Dependence on supplemental oxygen: Secondary | ICD-10-CM

## 2010-12-01 DIAGNOSIS — D751 Secondary polycythemia: Secondary | ICD-10-CM

## 2010-12-01 LAB — CBC WITH DIFFERENTIAL (CANCER CENTER ONLY)
BASO#: 0 10*3/uL (ref 0.0–0.2)
BASO%: 0.5 % (ref 0.0–2.0)
EOS%: 2.4 % (ref 0.0–7.0)
HGB: 18.6 g/dL — ABNORMAL HIGH (ref 13.0–17.1)
LYMPH#: 1.6 10*3/uL (ref 0.9–3.3)
MCHC: 35 g/dL (ref 32.0–35.9)
NEUT#: 6 10*3/uL (ref 1.5–6.5)
Platelets: 123 10*3/uL — ABNORMAL LOW (ref 145–400)
RDW: 12.2 % (ref 11.1–15.7)

## 2010-12-01 LAB — CHCC SATELLITE - SMEAR

## 2010-12-02 LAB — FERRITIN: Ferritin: 445 ng/mL — ABNORMAL HIGH (ref 22–322)

## 2010-12-02 LAB — ERYTHROPOIETIN: Erythropoietin: 8.9 m[IU]/mL (ref 2.6–34.0)

## 2010-12-02 LAB — RETICULOCYTES (CHCC): ABS Retic: 79.1 10*3/uL (ref 19.0–186.0)

## 2011-03-29 ENCOUNTER — Telehealth: Payer: Self-pay | Admitting: Hematology & Oncology

## 2011-03-29 NOTE — Telephone Encounter (Signed)
Pt called and cx 04/03/11 apt and resch for 04/21/11

## 2011-04-03 ENCOUNTER — Ambulatory Visit: Payer: Self-pay | Admitting: Hematology & Oncology

## 2011-04-03 ENCOUNTER — Other Ambulatory Visit: Payer: Self-pay | Admitting: Lab

## 2011-04-21 ENCOUNTER — Ambulatory Visit: Payer: Self-pay | Admitting: Hematology & Oncology

## 2011-04-21 ENCOUNTER — Other Ambulatory Visit: Payer: Self-pay | Admitting: Lab

## 2011-04-24 ENCOUNTER — Telehealth: Payer: Self-pay | Admitting: Hematology & Oncology

## 2011-04-24 NOTE — Telephone Encounter (Signed)
Patient called made 3-1 appointment

## 2011-04-28 ENCOUNTER — Other Ambulatory Visit (HOSPITAL_BASED_OUTPATIENT_CLINIC_OR_DEPARTMENT_OTHER): Payer: Self-pay | Admitting: Lab

## 2011-04-28 ENCOUNTER — Ambulatory Visit (HOSPITAL_BASED_OUTPATIENT_CLINIC_OR_DEPARTMENT_OTHER): Payer: Self-pay | Admitting: Hematology & Oncology

## 2011-04-28 DIAGNOSIS — C880 Waldenstrom macroglobulinemia: Secondary | ICD-10-CM

## 2011-04-28 DIAGNOSIS — N529 Male erectile dysfunction, unspecified: Secondary | ICD-10-CM

## 2011-04-28 DIAGNOSIS — E662 Morbid (severe) obesity with alveolar hypoventilation: Secondary | ICD-10-CM

## 2011-04-28 DIAGNOSIS — D751 Secondary polycythemia: Secondary | ICD-10-CM

## 2011-04-28 LAB — COMPREHENSIVE METABOLIC PANEL
Alkaline Phosphatase: 67 U/L (ref 39–117)
BUN: 13 mg/dL (ref 6–23)
Creatinine, Ser: 0.89 mg/dL (ref 0.50–1.35)
Glucose, Bld: 99 mg/dL (ref 70–99)
Sodium: 135 mEq/L (ref 135–145)
Total Bilirubin: 0.6 mg/dL (ref 0.3–1.2)
Total Protein: 6.8 g/dL (ref 6.0–8.3)

## 2011-04-28 LAB — CBC WITH DIFFERENTIAL (CANCER CENTER ONLY)
Eosinophils Absolute: 0.2 10*3/uL (ref 0.0–0.5)
HGB: 18.2 g/dL — ABNORMAL HIGH (ref 13.0–17.1)
LYMPH#: 1.8 10*3/uL (ref 0.9–3.3)
MCH: 34.3 pg — ABNORMAL HIGH (ref 28.0–33.4)
MONO%: 7.9 % (ref 0.0–13.0)
NEUT#: 6.7 10*3/uL — ABNORMAL HIGH (ref 1.5–6.5)
Platelets: 144 10*3/uL — ABNORMAL LOW (ref 145–400)
RBC: 5.31 10*6/uL (ref 4.20–5.70)
WBC: 9.4 10*3/uL (ref 4.0–10.0)

## 2011-04-28 LAB — RETICULOCYTES (CHCC)
ABS Retic: 69.7 10*3/uL (ref 19.0–186.0)
RBC.: 5.36 MIL/uL (ref 4.22–5.81)

## 2011-04-28 LAB — CHCC SATELLITE - SMEAR

## 2011-04-28 MED ORDER — SILDENAFIL CITRATE 50 MG PO TABS: 50.0000 mg | ORAL_TABLET | Freq: Every day | ORAL | Status: DC | PRN

## 2011-04-29 NOTE — Progress Notes (Signed)
This office note has been dictated.

## 2011-05-01 ENCOUNTER — Telehealth: Payer: Self-pay | Admitting: Hematology & Oncology

## 2011-05-01 NOTE — Telephone Encounter (Signed)
Mailed 08-03-11 schedule to Patient

## 2011-05-01 NOTE — Progress Notes (Signed)
CC:   Leslye Peer, MD Tereso Newcomer, PA, [facility]  DIAGNOSES: 1. Polycythemia secondary due to severe chronic obstructive pulmonary     disease. 2. Sleep apnea. 3. Obesity hypoventilation syndrome. 4. Chronic thrombocytopenia.  CURRENT THERAPY: 1. CPAP, the patient is very diligent with this. 2. Oxygen as indicated. 3. Aspirin 81 mg p.o. daily, the patient not vigorous with this.  INTERVAL HISTORY:  Mr. Cronkright comes in for his followup.  He is looking better.  He definitely is not as cyanotic appearing.  He is really doing a great job with the CPAP.  He realizes how much the CPAP has helped him.  He is more alert.  He is not as "groggy".  He is complaining of lack of "manly function".  I suppose this might be from his hyperviscosity due to the polycythemia.  I again told him that aspirin will help with this.  I also told him that phlebotomy could help.  He does not want to be phlebotomized.  He has not had any headache.  There has been no double vision or blurred vision.  He is not smoking.  He has really done a great job with this. He has not noted any change in bowel or bladder habits.  His appetite remains good.  He has not noted any rashes.  PHYSICAL EXAMINATION:  General:  This is an obese, white gentleman in no obvious distress.  Vital signs:  97.3, pulse 94, respiratory rate 18, blood pressure 118/76.  Weight is 313.  Head and neck:  Showed a normocephalic, atraumatic skull.  He has some slight facial plethora. He has decreased cyanosis of his lips.  There is no adenopathy in his neck.  Lungs:  Are clear to percussion and auscultation bilaterally. Cardiac:  Regular rate and rhythm with normal S1 and S2.  There are no murmurs, rubs or bruits.  Abdomen:  Soft with good bowel sounds.  There is no palpable abdominal mass.  There is no palpable hepatosplenomegaly. Back:  No tenderness over the spine, ribs or hips.  Extremities:  Shows no clubbing, cyanosis or edema.   Neurological:  No focal neurological deficits.  LABORATORY STUDIES:  White cell count is 9.4, hemoglobin 18.2, hematocrit 52.5, platelet count 144.  Ferritin is 457.  LDH is 141.  BUN and creatinine are 13 and 0.89.  IMPRESSION:  Mr. Jenny is a 41 year old gentleman with classic obesity hypoventilation syndrome.  When I initially saw him a year or so ago, his hemoglobin was over 22 or 23.  He clearly was hypoxic.  He clearly had hypoventilation issues.  The CPAP has been the best thing for him.  I reiterated to him the fact that he really needs to take the aspirin daily.  I did give him a prescription for Viagra (50 mg p.o. as directed) to try to help with his quality of life.  Again, he really does not want to be phlebotomized.  Hopefully, I do not think he needs to be as long as he does the CPAP.  We will go ahead and plan to get him back to see me in another 3 months for followup.    ______________________________ Josph Macho, M.D. PRE/MEDQ  D:  04/29/2011  T:  04/29/2011  Job:  [id number]

## 2011-05-12 ENCOUNTER — Ambulatory Visit (INDEPENDENT_AMBULATORY_CARE_PROVIDER_SITE_OTHER): Payer: Self-pay | Admitting: Emergency Medicine

## 2011-05-12 ENCOUNTER — Encounter: Payer: Self-pay | Admitting: Emergency Medicine

## 2011-05-12 VITALS — BP 128/68 | HR 108 | Temp 97.9°F | Ht 73.0 in | Wt 313.4 lb

## 2011-05-12 DIAGNOSIS — J449 Chronic obstructive pulmonary disease, unspecified: Secondary | ICD-10-CM

## 2011-05-12 DIAGNOSIS — G4733 Obstructive sleep apnea (adult) (pediatric): Secondary | ICD-10-CM

## 2011-05-12 DIAGNOSIS — F17200 Nicotine dependence, unspecified, uncomplicated: Secondary | ICD-10-CM

## 2011-05-12 DIAGNOSIS — J4489 Other specified chronic obstructive pulmonary disease: Secondary | ICD-10-CM

## 2011-05-12 NOTE — Patient Instructions (Signed)
Go back to wearing your oxygen with all exertion. Try using nasal saline spray to prevent the nose burning.  Wear your CPAP every night The most important thing for you to work on at this point is stopping smoking.  We will work on financial assistance to see if we can provide Symbicort and Spiriva If we are unable to get financial assistance, we may decide to change your inhaled meds  Follow with Dr Delton Coombes in 3 months

## 2011-05-12 NOTE — Assessment & Plan Note (Signed)
counseled cessation  

## 2011-05-12 NOTE — Progress Notes (Signed)
Subjective:    Patient ID: Billy [name], male    DOB: [birth year], 41 y.o.   MRN: [id number]  HPI 41 yo male with initial pulmonary consult during hospitalization 05/30/10 for severe OSA, pulmonary HTN, hypoxemia  06/14/2010 Post Hospital OV   Pt presents for a post hospital visit. Admitted 4/2-06/03/10 for increased swelling, dyspnea w/ hypoxemia. Has known hx of Polycythemia followed by Dr. Myna Hidalgo with hx of phelbotomies. Prior to admission he did not have a family doctor. Dr. Myna Hidalgo rx O2 in 02/2010 for desaturations. He was set up for sleep study by [facility] last year which showed severe OSA however pt says he did not start on CPAP- it is unclear why he did not start on CPAP.   During his hospitalization, his severe OSA treated with CPAP . He was informed to begin continuous O2 due to persistent desaturations.   However today on arrival to office he left O2 tank in car. Sat on arrival was -85-87% on room air. After rest he was 91%.  He also had cellulitis and phlebitis tx with IV antibitoics.   He was found to have a patent foramen ovale with a right to left shunt. Seen by Deboraha Sprang cards with plans for outpatient cath. And follow up  Since discharge he is feeling better with decreased dyspnea And swelling.   Says he is wearing CPAP everynight -all night . He says he feels more rested and does not fall asleep during daytime anymore. Prior to CPAP has severe daytime sleepiness.   He does not have insurance. He has been recently approved for disability. He does have upcoming appointment with [facility]. He says he has just been approved for Medicaid thru May of this year.   ROV 07/05/10 -- 41 yo m, obesity hypoventilation and OSA, severe COPD, polycythemia presumed due to chronic hypoxemia. Returns for f/u today. Has been very reliable with wearing CPAP. He is good about wearing his O2. [facility], hasn't needed to get his meds refilled yet. Not clear to me whether he has had  auto-titration. Wants to consider a portable concentrator vs liquid O2. Has had some cough last several days with green phlegm. No longer falling asleep during the day. No CP. Some exertional SOB. Smoking 0.5 pk a day.   ROV 10/17/10 -- OSA/OHS, severe COPD with associated hypoxemia and polycythemia. Continues to smoke 3/4 pack a day. He has been using CPAP every night, daytime sx are much improved, but he still has daily HA. Since last visit he was changed to pulsed flow 2L/min. NO exacerbations. C/o impotence.   ROV 05/12/11 -- OSA/OHS, severe COPD with associated hypoxemia and polycythemia. Still smokes 1 pk/day. Has been reliable with CPAP, less reliable with his BD's due to expense of his meds. Has been using it sparingly. Not wearing o2 here today - the O2 has been irritating his nose.   Review of Systems As per HPI    Objective:   Physical Exam Gen: Pleasant, well-nourished, in no distress,  normal affect  ENT: No lesions,  mouth clear,  oropharynx clear, no postnasal drip  Neck: No JVD, no TMG, no carotid bruits  Lungs: distant, clear on normal resp, soft wheeze on forced exp.   Cardiovascular: RRR, heart sounds normal, no murmur or gallops, no peripheral edema  Musculoskeletal: No deformities, no cyanosis or clubbing  Neuro: alert, non focal  Skin: some chronic changes B LE, no cellulitis  Assessment & Plan:  TOBACCO ABUSE counseled cessation  SLEEP APNEA, OBSTRUCTIVE CPAP qhs  COPD Will work on getting financial assist for Spiriva and Symbicort - if unable than will work on getting duonebs vis [facility]. Advised him to use the O2 reliably.

## 2011-05-12 NOTE — Assessment & Plan Note (Signed)
CPAP qhs 

## 2011-05-12 NOTE — Assessment & Plan Note (Signed)
Will work on Location manager for Spiriva and Symbicort - if unable than will work on getting duonebs vis [facility]. Advised him to use the O2 reliably.

## 2011-08-03 ENCOUNTER — Ambulatory Visit (HOSPITAL_BASED_OUTPATIENT_CLINIC_OR_DEPARTMENT_OTHER): Payer: Self-pay | Admitting: Hematology & Oncology

## 2011-08-03 ENCOUNTER — Other Ambulatory Visit (HOSPITAL_BASED_OUTPATIENT_CLINIC_OR_DEPARTMENT_OTHER): Payer: Self-pay | Admitting: Lab

## 2011-08-03 VITALS — BP 125/87 | HR 89 | Temp 97.8°F | Ht 72.0 in | Wt 304.0 lb

## 2011-08-03 DIAGNOSIS — D751 Secondary polycythemia: Secondary | ICD-10-CM

## 2011-08-03 DIAGNOSIS — N529 Male erectile dysfunction, unspecified: Secondary | ICD-10-CM

## 2011-08-03 DIAGNOSIS — J449 Chronic obstructive pulmonary disease, unspecified: Secondary | ICD-10-CM

## 2011-08-03 LAB — CBC WITH DIFFERENTIAL (CANCER CENTER ONLY)
BASO#: 0.1 10*3/uL (ref 0.0–0.2)
Eosinophils Absolute: 0.2 10*3/uL (ref 0.0–0.5)
HGB: 17.6 g/dL — ABNORMAL HIGH (ref 13.0–17.1)
LYMPH#: 1.6 10*3/uL (ref 0.9–3.3)
MONO#: 0.7 10*3/uL (ref 0.1–0.9)
NEUT#: 7.1 10*3/uL — ABNORMAL HIGH (ref 1.5–6.5)
RBC: 5.18 10*6/uL (ref 4.20–5.70)
WBC: 9.6 10*3/uL (ref 4.0–10.0)

## 2011-08-03 NOTE — Progress Notes (Signed)
This office note has been dictated.

## 2011-08-04 LAB — FERRITIN: Ferritin: 444 ng/mL — ABNORMAL HIGH (ref 22–322)

## 2011-08-04 LAB — RETICULOCYTES (CHCC): Retic Ct Pct: 1.1 % (ref 0.4–2.3)

## 2011-08-04 LAB — LACTATE DEHYDROGENASE: LDH: 138 U/L (ref 94–250)

## 2011-08-07 NOTE — Progress Notes (Signed)
CC:   Billy Peer, MD Billy Newcomer, Billy [name], [facility]  DIAGNOSES: 1. 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 2. Obesity hypoventilation syndrome. 3. Thrombocytopenia-intermittent.  CURRENT THERAPY: 1. Aspirin 81 mg p.o. daily. 2. CPAP.  INTERVAL HISTORY:  [name] comes in for followup.  He is looking better every time I see him.  The CPAP has done exactly what I knew it would.  He was getting better oxygenation.  His hemoglobin keeps coming down.  He is getting better circulation.  He feels better.  He is still not able to really do anything work wise because of severe fatigue.  He does desaturate fairly easily without supplemental oxygen.  He has not had any problems with cellulitis in the legs.  He has had no nausea or vomiting.  He has had no headache.  He has had no problems with bowels or bladder.  PHYSICAL EXAM:  General: This is an obese, white gentleman in no obvious distress.  Vital signs:  Temperature of 97.8, pulse 89, respiratory rate 18, blood pressure 125/87.  Weight is 304.  Head and neck:  Shows a normocephalic, atraumatic skull.  He has minimal cyanosis of his lips now.  He does not have the ruddy complexion on his face.  There is no conjunctival inflammation.  He has no adenopathy in his neck.  Lungs: Clear bilaterally.  Cardiac:  Regular rate and rhythm with a normal S1 and S2.  There are no murmurs, rubs or bruits.  Abdomen: Soft with good bowel sounds.  There is no palpable abdominal mass.  He is obese.  There is no palpable hepatosplenomegaly.  Extremities:  Show minimal nonpitting edema of his lower legs.  He has good range motion of his joints.  Skin:  Shows no rashes.  Neurological:  Shows no focal neurological deficit.  LABORATORY STUDIES:  White cell count is 9.6, hemoglobin 17.6, hematocrit 51.5, platelet count 146.  IMPRESSION:  [name] is a 41 year old gentleman with secondary polycythemia.   This clearly is given by hypoventilation.  Now that he is on CPAP, his blood counts continue to improve.  His overall skin texture and color is improving.  The edema in his legs also is improving because of better circulation.  I think we could probably get him back now after Labor Day.  I do not see that we need any blood work in between visits.  I am just so happy that he is doing better.  He is feeling better. Hopefully, he may actually be able to get back to work at some point.    ______________________________ Billy [name], M.D. PRE/MEDQ  D:  08/03/2011  T:  08/04/2011  Job:  [id number]

## 2011-09-11 ENCOUNTER — Ambulatory Visit: Payer: Self-pay | Admitting: Emergency Medicine

## 2011-10-13 ENCOUNTER — Ambulatory Visit (INDEPENDENT_AMBULATORY_CARE_PROVIDER_SITE_OTHER): Payer: Self-pay | Admitting: Emergency Medicine

## 2011-10-13 ENCOUNTER — Encounter: Payer: Self-pay | Admitting: Emergency Medicine

## 2011-10-13 VITALS — BP 118/78 | HR 91 | Temp 97.0°F | Ht 73.0 in | Wt 314.6 lb

## 2011-10-13 DIAGNOSIS — G4733 Obstructive sleep apnea (adult) (pediatric): Secondary | ICD-10-CM

## 2011-10-13 DIAGNOSIS — J449 Chronic obstructive pulmonary disease, unspecified: Secondary | ICD-10-CM

## 2011-10-13 NOTE — Patient Instructions (Addendum)
I would like to continue your Spiriva daily and you Symbicort twice a day Please bring your paperwork for financial assistance to the office so we can work on helping you with your medications.  Wear your CPAP every night Wear your oxygen with exertion.  Follow with Dr Delton Coombes in 3 months or sooner if you have any problems.

## 2011-10-13 NOTE — Assessment & Plan Note (Signed)
CPAP.  

## 2011-10-13 NOTE — Progress Notes (Signed)
Subjective:    Patient ID: Billy [name], male    DOB: [birth year], 41 y.o.   MRN: [id number] HPI 41 yo male with initial pulmonary consult during hospitalization 05/30/10 for severe OSA, pulmonary HTN, hypoxemia  06/14/2010 Post Hospital OV   Pt presents for a post hospital visit. Admitted 4/2-06/03/10 for increased swelling, dyspnea w/ hypoxemia. Has known hx of Polycythemia followed by Dr. Myna Hidalgo with hx of phelbotomies. Prior to admission he did not have a family doctor. Dr. Myna Hidalgo rx O2 in 02/2010 for desaturations. He was set up for sleep study by [facility] last year which showed severe OSA however pt says he did not start on CPAP- it is unclear why he did not start on CPAP.   During his hospitalization, his severe OSA treated with CPAP . He was informed to begin continuous O2 due to persistent desaturations.   However today on arrival to office he left O2 tank in car. Sat on arrival was -85-87% on room air. After rest he was 91%.  He also had cellulitis and phlebitis tx with IV antibitoics.   He was found to have a patent foramen ovale with a right to left shunt. Seen by Deboraha Sprang cards with plans for outpatient cath. And follow up  Since discharge he is feeling better with decreased dyspnea And swelling.   Says he is wearing CPAP everynight -all night . He says he feels more rested and does not fall asleep during daytime anymore. Prior to CPAP has severe daytime sleepiness.   He does not have insurance. He has been recently approved for disability. He does have [facility] appointment with [facility]. He says he has just been approved for Medicaid thru May of this year.   ROV 07/05/10 -- 41 yo m, obesity hypoventilation and OSA, severe COPD, polycythemia presumed due to chronic hypoxemia. Returns for f/u today. Has been very reliable with wearing CPAP. He is good about wearing his O2. [facility], hasn't needed to get his meds refilled yet. Not clear to me whether he has had  auto-titration. Wants to consider a portable concentrator vs liquid O2. Has had some cough last several days with green phlegm. No longer falling asleep during the day. No CP. Some exertional SOB. Smoking 0.5 pk a day.   ROV 10/17/10 -- OSA/OHS, severe COPD with associated hypoxemia and polycythemia. Continues to smoke 3/4 pack a day. He has been using CPAP every night, daytime sx are much improved, but he still has daily HA. Since last visit he was changed to pulsed flow 2L/min. NO exacerbations. C/o impotence.   ROV 05/12/11 -- OSA/OHS, severe COPD with associated hypoxemia and polycythemia. Still smokes 1 pk/day. Has been reliable with CPAP, less reliable with his BD's due to expense of his meds. Has been using it sparingly. Not wearing o2 here today - the O2 has been irritating his nose.   ROV 10/13/11 -- OSA/OHS, severe COPD with associated hypoxemia and polycythemia. Still smokes 1 pk/day. Not using his BD's - doesn;t have his BD's, can't afford them, needs to fill out his financial assistance paperwork. He ran out of symbicort 3 weeks ago. He doesn't take Spiriva every day, uses it about 2x a week. Wear his his CPAP reliably. He won't have insurance until next April.   Review of Systems As per HPI    Objective:   Physical Exam Filed Vitals:   10/13/11 1540  BP: 118/78  Pulse: 91  Temp: 97 F (36.1 C)  Gen: Pleasant, well-nourished, in no distress,  normal affect  ENT: No lesions,  mouth clear,  oropharynx clear, no postnasal drip  Neck: No JVD, no TMG, no carotid bruits  Lungs: distant, clear on normal resp, soft wheeze on forced exp.   Cardiovascular: RRR, heart sounds normal, no murmur or gallops, no peripheral edema  Musculoskeletal: No deformities, no cyanosis or clubbing  Neuro: alert, non focal  Skin: some chronic changes B LE, no cellulitis  Assessment & Plan:  SLEEP APNEA, OBSTRUCTIVE CPAP  COPD Continue the Spiriva + Symbicort; will have to supply him with  samples. Try to get financial assist from the companies.

## 2011-10-13 NOTE — Assessment & Plan Note (Signed)
Continue the Spiriva + Symbicort; will have to supply him with samples. Try to get financial assist from the companies.

## 2011-10-26 ENCOUNTER — Other Ambulatory Visit: Payer: Self-pay | Admitting: *Deleted

## 2011-10-26 MED ORDER — TIOTROPIUM BROMIDE MONOHYDRATE 18 MCG IN CAPS: 18.0000 ug | ORAL_CAPSULE | Freq: Every day | RESPIRATORY_TRACT | Status: DC

## 2011-10-26 MED ORDER — BUDESONIDE-FORMOTEROL FUMARATE 160-4.5 MCG/ACT IN AERO: 2.0000 | INHALATION_SPRAY | Freq: Two times a day (BID) | RESPIRATORY_TRACT | Status: DC | PRN

## 2011-11-02 ENCOUNTER — Ambulatory Visit: Payer: Self-pay | Admitting: Hematology & Oncology

## 2011-11-02 ENCOUNTER — Telehealth: Payer: Self-pay | Admitting: Hematology & Oncology

## 2011-11-02 ENCOUNTER — Other Ambulatory Visit: Payer: Self-pay | Admitting: Lab

## 2011-11-02 NOTE — Telephone Encounter (Signed)
Left pt message to call if he wants to reschedule missed appointment from today

## 2011-11-06 ENCOUNTER — Telehealth: Payer: Self-pay | Admitting: Hematology & Oncology

## 2011-11-06 NOTE — Telephone Encounter (Signed)
Patient called and resch 11/02/11 missed appt for 11/24/11

## 2011-11-24 ENCOUNTER — Ambulatory Visit (HOSPITAL_BASED_OUTPATIENT_CLINIC_OR_DEPARTMENT_OTHER): Payer: Self-pay | Admitting: Medical

## 2011-11-24 ENCOUNTER — Ambulatory Visit (HOSPITAL_BASED_OUTPATIENT_CLINIC_OR_DEPARTMENT_OTHER): Payer: Self-pay | Admitting: Lab

## 2011-11-24 VITALS — BP 125/73 | HR 84 | Temp 98.0°F | Resp 22 | Ht 71.0 in | Wt 313.0 lb

## 2011-11-24 DIAGNOSIS — D696 Thrombocytopenia, unspecified: Secondary | ICD-10-CM

## 2011-11-24 DIAGNOSIS — D751 Secondary polycythemia: Secondary | ICD-10-CM

## 2011-11-24 DIAGNOSIS — E662 Morbid (severe) obesity with alveolar hypoventilation: Secondary | ICD-10-CM

## 2011-11-24 DIAGNOSIS — J449 Chronic obstructive pulmonary disease, unspecified: Secondary | ICD-10-CM

## 2011-11-24 LAB — CBC WITH DIFFERENTIAL (CANCER CENTER ONLY)
Eosinophils Absolute: 0.2 10*3/uL (ref 0.0–0.5)
HCT: 51 % — ABNORMAL HIGH (ref 38.7–49.9)
LYMPH%: 20.6 % (ref 14.0–48.0)
MCV: 100 fL — ABNORMAL HIGH (ref 82–98)
MONO#: 0.7 10*3/uL (ref 0.1–0.9)
Platelets: 132 10*3/uL — ABNORMAL LOW (ref 145–400)
RBC: 5.12 10*6/uL (ref 4.20–5.70)
WBC: 8 10*3/uL (ref 4.0–10.0)

## 2011-11-24 LAB — CHCC SATELLITE - SMEAR

## 2011-11-24 LAB — FERRITIN: Ferritin: 412 ng/mL — ABNORMAL HIGH (ref 22–322)

## 2011-11-24 NOTE — Progress Notes (Addendum)
Diagnoses: New.  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 #2 obesity, hypoventilation syndrome. #3 thrombocytopenia-intermittent.  Current therapy: #1 aspirin 81 mg by mouth daily. #2 CPAP.  Interval history: Billy [name] presents today for an office followup visit.  He still continues to look quite well.  He still remains on the CPAP machine.  He is getting better.  Oxygenation.  His hemoglobin is pretty stable compared to his last one back in June.  It is 17.5.  Today.  Unfortunately, Billy [name] still continues to smoke quite a bit.  He is going to try an electronic cigarettes.  I told him that I think if he trends down on his cigarettes and hopefully quit since that she will even help his blood counts.  He also states, that he can try some light exercise in the mornings.  He currently is on disability and really sounds like he is living a sedentary lifestyle.  He has an excellent appetite.  He denies any nausea, vomiting, diarrhea, or constipation.  He denies any cough, chest pain, or increased shortness of breath.  He does have some dyspnea on exertion.  Due to his severe chronic obstructive pulmonary disease.  He denies any fevers, chills, or night sweats.  He denies any headaches, blurry vision rashes.  He denies any problems with cellulitis in his legs.  He denies any obvious, or abnormal bleeding.  Review of Systems: Pt. Denies any changes in their vision, hearing, adenopathy, fevers, chills, nausea, vomiting, diarrhea, constipation, chest pain, shortness of breath, passing blood, passing out, blacking out,  any changes in skin, joints, neurologic or psychiatric except as noted.  Physical Exam: This is a 41 year old, obese, white male, in no obvious distress Vitals: Temperature 90.0 degrees, pulse 84, respirations 20, blood pressure 125/73, weight 313 pounds HEENT reveals a normocephalic, atraumatic skull, no scleral icterus, no oral lesions  Neck  is supple without any cervical or supraclavicular adenopathy.  Lungs are clear to auscultation bilaterally. There are no wheezes, rales or rhonci Cardiac is regular rate and rhythm with a normal S1 and S2. There are no murmurs, rubs, or bruits.  Abdomen is soft with good bowel sounds, there is no palpable mass. There is no palpable hepatosplenomegaly. There is no palpable fluid wave.  Musculoskeletal no tenderness of the spine, ribs, or hips.  Extremities there are no clubbing, cyanosis, he does have minimal nonpitting edema of his lower legs..  Skin no petechia, purpura or ecchymosis Neurologic is nonfocal.   Laboratory Data: White count 8.0, hemoglobin 17.5, hematocrit 31.0, platelets 132,000  Current Outpatient Prescriptions on File Prior to Visit  Medication Sig Dispense Refill  . albuterol (PROVENTIL HFA) 108 (90 BASE) MCG/ACT inhaler 1-2 puffs very 4-6 hours as needed       . aspirin 81 MG tablet Take 81 mg by mouth daily.        . budesonide-formoterol (SYMBICORT) 160-4.5 MCG/ACT inhaler Inhale 2 puffs into the lungs 2 (two) times daily as needed.  3 Inhaler  3  . tiotropium (SPIRIVA) 18 MCG inhalation capsule Place 1 capsule (18 mcg total) into inhaler and inhale daily.  90 capsule  3  . DISCONTD: sildenafil (VIAGRA) 50 MG tablet Take 1 tablet (50 mg total) by mouth daily as needed for erectile dysfunction.  10 tablet  0   Assessment/Plan: This is a pleasant, 41 year old, gentleman, with the following issues:  #1, secondary polycythemia.  This is clearly, given by hypoventilation.  He is currently on CPAP,  and his blood counts.  Overall have continued to improve.  His overall skin texture and color is improving as well.  His edema in his lower legs are also improved, secondary to better.  Circulation.  I also feel that if he quits smoking.  This will also improve his secondary polycythemia.   #2 followup we will see Billy [name] back in 3 months, but before then should there be  questions or concerns.  #3 thrombocytopenia-this is intermittent.  He is asymptomatic.  We will continue to monitor this.

## 2012-01-11 ENCOUNTER — Encounter: Payer: Self-pay | Admitting: Emergency Medicine

## 2012-01-11 ENCOUNTER — Ambulatory Visit (INDEPENDENT_AMBULATORY_CARE_PROVIDER_SITE_OTHER): Payer: Self-pay | Admitting: Emergency Medicine

## 2012-01-11 VITALS — BP 110/80 | HR 92 | Temp 98.1°F | Ht 73.0 in | Wt 317.0 lb

## 2012-01-11 DIAGNOSIS — G4733 Obstructive sleep apnea (adult) (pediatric): Secondary | ICD-10-CM

## 2012-01-11 DIAGNOSIS — J449 Chronic obstructive pulmonary disease, unspecified: Secondary | ICD-10-CM

## 2012-01-11 NOTE — Assessment & Plan Note (Signed)
Good compliance CPAP 

## 2012-01-11 NOTE — Patient Instructions (Addendum)
Continue your oxygen with exertion Use your CPAP every night Continue to work on stopping smoking Follow with Dr Delton Coombes in 6 months or sooner if you have any problems

## 2012-01-11 NOTE — Assessment & Plan Note (Signed)
-   smoking cessation - same BD's - O2 with all exertion - rov 6 months

## 2012-01-11 NOTE — Progress Notes (Signed)
  Subjective:    Patient ID: Billy [name], male    DOB: 1970/03/20, 41 y.o.   MRN: [id number] HPI 41 yo male with initial pulmonary consult during hospitalization 05/30/10 for severe OSA, pulmonary HTN, hypoxemia   ROV 05/12/11 -- OSA/OHS, severe COPD with associated hypoxemia and polycythemia. Still smokes 1 pk/day. Has been reliable with CPAP, less reliable with his BD's due to expense of his meds. Has been using it sparingly. Not wearing o2 here today - the O2 has been irritating his nose.   ROV 10/13/11 -- OSA/OHS, severe COPD with associated hypoxemia and polycythemia. Still smokes 1 pk/day. Not using his BD's - doesn;t have his BD's, can't afford them, needs to fill out his financial assistance paperwork. He ran out of symbicort 3 weeks ago. He doesn't take Spiriva every day, uses it about 2x a week. Wear his his CPAP reliably. He won't have insurance until next April.   ROV 01/11/12 -- OSA/OHS with good CPAP compliance, severe COPD with associated hypoxemia and polycythemia. Still smokes ~1 pk/day. Here for regular follow up. He is using O2 prn, was not desaturated on exam today. He uses Symbicort and Spiriva reliably now. Uses albuterol prn >> averages few times a week    Objective:   Physical Exam Filed Vitals:   01/11/12 1502  BP: 110/80  Pulse: 92  Temp: 98.1 F (36.7 C)   Gen: Pleasant, obese, in no distress,  normal affect  ENT: No lesions,  mouth clear,  oropharynx clear, no postnasal drip  Neck: No JVD, no TMG, no carotid bruits  Lungs: distant, clear on normal resp, soft wheeze on forced exp.   Cardiovascular: RRR, heart sounds normal, no murmur or gallops, no peripheral edema  Musculoskeletal: No deformities, no cyanosis or clubbing  Neuro: alert, non focal  Skin: some chronic changes B LE, no cellulitis  Assessment & Plan:  No problem-specific assessment & plan notes found for this encounter.

## 2012-02-23 ENCOUNTER — Other Ambulatory Visit (HOSPITAL_BASED_OUTPATIENT_CLINIC_OR_DEPARTMENT_OTHER): Payer: Self-pay | Admitting: Lab

## 2012-02-23 ENCOUNTER — Ambulatory Visit (HOSPITAL_BASED_OUTPATIENT_CLINIC_OR_DEPARTMENT_OTHER): Payer: Self-pay | Admitting: Hematology & Oncology

## 2012-02-23 VITALS — BP 117/77 | HR 87 | Temp 97.9°F | Resp 20 | Ht 73.0 in | Wt 320.0 lb

## 2012-02-23 DIAGNOSIS — D751 Secondary polycythemia: Secondary | ICD-10-CM

## 2012-02-23 DIAGNOSIS — E662 Morbid (severe) obesity with alveolar hypoventilation: Secondary | ICD-10-CM

## 2012-02-23 LAB — CBC WITH DIFFERENTIAL (CANCER CENTER ONLY)
BASO#: 0 10*3/uL (ref 0.0–0.2)
Eosinophils Absolute: 0.2 10*3/uL (ref 0.0–0.5)
HGB: 17.2 g/dL — ABNORMAL HIGH (ref 13.0–17.1)
LYMPH#: 1.7 10*3/uL (ref 0.9–3.3)
MCH: 33.9 pg — ABNORMAL HIGH (ref 28.0–33.4)
MONO#: 0.6 10*3/uL (ref 0.1–0.9)
NEUT#: 4.5 10*3/uL (ref 1.5–6.5)
RBC: 5.07 10*6/uL (ref 4.20–5.70)

## 2012-02-23 LAB — FERRITIN: Ferritin: 433 ng/mL — ABNORMAL HIGH (ref 22–322)

## 2012-02-23 NOTE — Progress Notes (Signed)
CC:   Billy Peer, MD  DIAGNOSES: 1. Obesity hypoventilation syndrome. 2. Polycythemia secondary to chronic hypoxia. 3. Mild thrombocytopenia, chronic.  CURRENT THERAPY:  Aspirin 81 mg p.o. daily.  INTERIM HISTORY:  Billy [name] comes in for followup.  He was last seen back in September.  He has been doing okay.  With Obamacare he is going to have insurance.  I am just grateful that he can get insurance now.  There have been no problems with him using his CPAP.  He has had no headache.  He is still smoking but trying to cut back.  He is on disability.  He is just not able to work because of his poor lung function.  When we last saw him, his ferritin was 412.  A lot of this is reactive.  PHYSICAL EXAMINATION:  General:  This is a morbidly obese white gentleman in no obvious distress.  Vital signs:  97.9, pulse 87, respiratory rate 20, blood pressure 117/77.  Weight is 320.  Head and neck:  Shows a normocephalic, atraumatic skull.  There are no ocular or oral lesions.  There are no palpable cervical or supraclavicular lymph nodes.  Lungs:  Clear bilaterally.  Cardiac:  Regular rate and rhythm with a normal S1 and S2.  There are no murmurs, rubs or bruits. Abdomen:  Obese but soft.  He has good bowel sounds.  There is no fluid wave.  There is no palpable hepatosplenomegaly.  Back:  No tenderness over the spine, ribs or hips.  Extremities:  Show no clubbing, cyanosis or edema.  There may be some slight stasis dermatitis changes but this appears to be a little better.  Neurological:  Shows no focal neurological deficit.  LABORATORY STUDIES:  White cell count 7.1, hemoglobin 17.2, hematocrit 51.4, platelet count 135.  IMPRESSION:  Billy [name] is a nice 41 year old gentleman with morbid obesity.  He has obesity hypoventilation syndrome.  He does have some I think right heart issues because of this.  However, he is on CPAP and has improved.  When I first saw him, his  hemoglobin was I think about 22 or 23.  Again, the CPAP really has helped and he is very compliant with it.  There is no need for [place] to phlebotomize.  Again, his erythrocytosis is reactive and physiologic.  We will get him back in 4 months' time now.  He is still going to try to cut back on smoking.    ______________________________ Josph Macho, M.D. PRE/MEDQ  D:  02/23/2012  T:  02/23/2012  Job:  [id number]

## 2012-02-23 NOTE — Progress Notes (Signed)
This office note has been dictated.

## 2012-06-04 ENCOUNTER — Telehealth: Payer: Self-pay | Admitting: Emergency Medicine

## 2012-06-04 DIAGNOSIS — G4733 Obstructive sleep apnea (adult) (pediatric): Secondary | ICD-10-CM

## 2012-06-04 NOTE — Telephone Encounter (Signed)
I spoke with [facility]. She stated pt needs new CPAP supplies. i have faxed this over to [facility] and Almyra Free is aware I have faxed this order. Nothing further was needed

## 2012-06-07 ENCOUNTER — Encounter: Payer: Self-pay | Admitting: Adult Health

## 2012-06-07 ENCOUNTER — Ambulatory Visit (INDEPENDENT_AMBULATORY_CARE_PROVIDER_SITE_OTHER): Payer: Medicare Other | Admitting: Adult Health

## 2012-06-07 VITALS — BP 122/75 | HR 77 | Temp 98.1°F | Ht 73.0 in | Wt 319.2 lb

## 2012-06-07 DIAGNOSIS — J449 Chronic obstructive pulmonary disease, unspecified: Secondary | ICD-10-CM

## 2012-06-07 MED ORDER — TIOTROPIUM BROMIDE MONOHYDRATE 18 MCG IN CAPS: 18.0000 ug | ORAL_CAPSULE | Freq: Every day | RESPIRATORY_TRACT | Status: DC

## 2012-06-07 MED ORDER — ALBUTEROL SULFATE HFA 108 (90 BASE) MCG/ACT IN AERS: INHALATION_SPRAY | RESPIRATORY_TRACT | Status: DC

## 2012-06-07 MED ORDER — PREDNISONE 10 MG PO TABS: ORAL_TABLET | ORAL | Status: DC

## 2012-06-07 MED ORDER — DOXYCYCLINE HYCLATE 100 MG PO TABS: 100.0000 mg | ORAL_TABLET | Freq: Two times a day (BID) | ORAL | Status: DC

## 2012-06-07 NOTE — Progress Notes (Signed)
  Subjective:    Patient ID: Billy [name], male    DOB: [birth year], 42 y.o.   MRN: [id number] HPI 42 yo male with initial pulmonary consult during hospitalization 05/30/10 for severe OSA, pulmonary HTN, hypoxemia   ROV 05/12/11 -- OSA/OHS, severe COPD with associated hypoxemia and polycythemia. Still smokes 1 pk/day. Has been reliable with CPAP, less reliable with his BD's due to expense of his meds. Has been using it sparingly. Not wearing o2 here today - the O2 has been irritating his nose.   ROV 10/13/11 -- OSA/OHS, severe COPD with associated hypoxemia and polycythemia. Still smokes 1 pk/day. Not using his BD's - doesn;t have his BD's, can't afford them, needs to fill out his financial assistance paperwork. He ran out of symbicort 3 weeks ago. He doesn't take Spiriva every day, uses it about 2x a week. Wear his his CPAP reliably. He won't have insurance until next April.   ROV 01/11/12 -- OSA/OHS with good CPAP compliance, severe COPD with associated hypoxemia and polycythemia. Still smokes ~1 pk/day. Here for regular follow up. He is using O2 prn, was not desaturated on exam today. He uses Symbicort and Spiriva reliably now. Uses albuterol prn >> averages few times a week    06/07/2012 Acute OV  Complains of prod cough with clear/light yellow mucus, wheezing, increased SOB x1 week - denies f/c/s, tightness.  has been out of spiriva x1 week and reports does not take regularly/  Still smoking 1/2-1 PPD . Discussed smoking cessation  Not taking Spriva on reg basis.  On disability now has insurance to cover rx. Will need to send refills to pharm.  No hemoptysis , chest pain or orthopnea, edema , calf pain , fever.     ROS  Neg except for HPI  Objective:   Physical Exam   Gen: Pleasant, obese, in no distress,  normal affect  ENT: No lesions,  mouth clear,  oropharynx clear, no postnasal drip  Neck: No JVD, no TMG, no carotid bruits  Lungs: few exp wheezing .   Cardiovascular: RRR, heart  sounds normal, no murmur or gallops, no peripheral edema  Musculoskeletal: No deformities, no cyanosis or clubbing  Neuro: alert, non focal  Skin: some chronic changes B LE  Assessment & Plan:

## 2012-06-07 NOTE — Addendum Note (Signed)
Addended by: Boone Master E on: 06/07/2012 04:52 PM   Modules accepted: Orders

## 2012-06-07 NOTE — Assessment & Plan Note (Signed)
Exacerbation  Smoking cessation discussed   Plan  Doxycycline 100mg  Twice daily  For 7 days -take with food  Prednisone taper over next week .  Wear CPAP at night as directed.  Continue on  Symbicort 160/4.48mcg 2 puffs Twice daily   Continue Spiriva daily  1 puff daily  follow up Dr. Delton Coombes in 2 weeks and As needed

## 2012-06-07 NOTE — Patient Instructions (Addendum)
MOST IMPORTANT GOAL IS TO STOP  SMOKING.  Doxycycline 100mg  Twice daily  For 7 days -take with food  Prednisone taper over next week .  Wear CPAP at night as directed.  Continue on  Symbicort 160/4.24mcg 2 puffs Twice daily   Continue Spiriva daily  1 puff daily  follow up Dr. Delton Coombes in 2 weeks and As needed

## 2012-06-07 NOTE — Addendum Note (Signed)
Addended by: Boone Master E on: 06/07/2012 05:09 PM   Modules accepted: Orders

## 2012-06-24 ENCOUNTER — Other Ambulatory Visit (HOSPITAL_BASED_OUTPATIENT_CLINIC_OR_DEPARTMENT_OTHER): Payer: Medicare Other | Admitting: Lab

## 2012-06-24 ENCOUNTER — Ambulatory Visit (HOSPITAL_BASED_OUTPATIENT_CLINIC_OR_DEPARTMENT_OTHER): Payer: Medicare Other | Admitting: Medical

## 2012-06-24 VITALS — BP 129/79 | HR 88 | Temp 98.1°F | Resp 18 | Ht 73.0 in | Wt 318.0 lb

## 2012-06-24 DIAGNOSIS — D751 Secondary polycythemia: Secondary | ICD-10-CM

## 2012-06-24 DIAGNOSIS — R0902 Hypoxemia: Secondary | ICD-10-CM

## 2012-06-24 DIAGNOSIS — R609 Edema, unspecified: Secondary | ICD-10-CM

## 2012-06-24 DIAGNOSIS — D45 Polycythemia vera: Secondary | ICD-10-CM

## 2012-06-24 DIAGNOSIS — D696 Thrombocytopenia, unspecified: Secondary | ICD-10-CM

## 2012-06-24 DIAGNOSIS — F17200 Nicotine dependence, unspecified, uncomplicated: Secondary | ICD-10-CM

## 2012-06-24 LAB — CBC WITH DIFFERENTIAL (CANCER CENTER ONLY)
BASO#: 0 10*3/uL (ref 0.0–0.2)
EOS%: 3 % (ref 0.0–7.0)
Eosinophils Absolute: 0.3 10*3/uL (ref 0.0–0.5)
HGB: 16.8 g/dL (ref 13.0–17.1)
LYMPH#: 2.1 10*3/uL (ref 0.9–3.3)
NEUT#: 6.3 10*3/uL (ref 1.5–6.5)
RBC: 4.96 10*6/uL (ref 4.20–5.70)

## 2012-06-24 NOTE — Progress Notes (Signed)
Diagnoses: New.  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 #2 obesity, hypoventilation syndrome. #3 thrombocytopenia-intermittent.  Current therapy: #1 aspirin 81 mg by mouth daily. #2 CPAP.  Interval history: Billy [name] presents today for an office followup visit.  Billy [name] still continues to look quite well.  Billy [name] still remains on the CPAP machine.  Billy [name] is getting better oxygenation.  Globin is relatively stable at 16.8.  It does continue to decrease.Marland Kitchen  Unfortunately, Billy [name] still continues to smoke quite a bit.  I do believe that Billy [name] cut back on his smoking his counts would improve.   Billy [name] currently is on disability and really sounds like Billy [name] is living a sedentary lifestyle.  Billy [name] has an excellent appetite.  Billy [name] denies any nausea, vomiting, diarrhea, or constipation.  Billy [name] denies any cough, chest pain, or increased shortness of breath.  Billy [name] does have some dyspnea on exertion.  Due to his severe chronic obstructive pulmonary disease.  Billy [name] denies any fevers, chills, or night sweats.  Billy [name] denies any headaches, blurry vision rashes.  Billy [name] denies any problems with cellulitis in his legs.  Billy [name] denies any obvious, or abnormal bleeding.passing blood, passing out, blacking out,  any changes in skin, joints, neurologic or psychiatric except as noted.  Review of Systems: Constitutional:Negative for malaise/fatigue, fever, chills, weight loss, diaphoresis, activity change, appetite change, and unexpected weight change.  HEENT: Negative for double vision, blurred vision, visual loss, ear pain, tinnitus, congestion, rhinorrhea, epistaxis sore throat or sinus disease, oral pain/lesion, tongue soreness Respiratory: Negative for cough, chest tightness, shortness of breath, wheezing and stridor.  Cardiovascular: Negative for chest pain, palpitations, leg swelling, orthopnea, PND, DOE or claudication Gastrointestinal: Negative for nausea, vomiting, abdominal pain, diarrhea, constipation,  blood in stool, melena, hematochezia, abdominal distention, anal bleeding, rectal pain, anorexia and hematemesis.  Genitourinary: Negative for dysuria, frequency, hematuria,  Musculoskeletal: Negative for myalgias, back pain, joint swelling, arthralgias and gait problem.  Skin: Negative for rash, color change, pallor and wound.  Neurological:. Negative for dizziness/light-headedness, tremors, seizures, syncope, facial asymmetry, speech difficulty, weakness, numbness, headaches and paresthesias.  Hematological: Negative for adenopathy. Does not bruise/bleed easily.  Psychiatric/Behavioral:  Negative for depression, no loss of interest in normal activity or change in sleep pattern.   Physical Exam: This is a 42 year old, obese, white male, in no obvious distress Vitals: Temperature 90.0 degrees, pulse 84, respirations 20, blood pressure 125/73, weight 313 pounds HEENT reveals a normocephalic, atraumatic skull, no scleral icterus, no oral lesions  Neck is supple without any cervical or supraclavicular adenopathy.  Lungs are clear to auscultation bilaterally. There are no wheezes, rales or rhonci Cardiac is regular rate and rhythm with a normal S1 and S2. There are no murmurs, rubs, or bruits.  Abdomen is soft with good bowel sounds, there is no palpable mass. There is no palpable hepatosplenomegaly. There is no palpable fluid wave.  Musculoskeletal no tenderness of the spine, ribs, or hips.  Extremities there are no clubbing, cyanosis, Billy [name] does have minimal nonpitting edema of his lower legs..  Skin no petechia, purpura or ecchymosis Neurologic is nonfocal.   Laboratory Data: White count 9.5, hemoglobin 16.8, hematocrit 51.3, platelets 136,000  Current Outpatient Prescriptions on File Prior to Visit  Medication Sig Dispense Refill  . albuterol (PROVENTIL HFA) 108 (90 BASE) MCG/ACT inhaler 1-2 puffs very 4-6 hours as needed  18 g  5  . aspirin 81 MG tablet Take 81 mg by mouth daily.        Marland Kitchen  budesonide-formoterol (SYMBICORT) 160-4.5 MCG/ACT inhaler Inhale 2 puffs into the lungs 2 (two) times daily as needed.  3 Inhaler  3  . predniSONE (DELTASONE) 10 MG tablet 4 tabs for 2 days, then 3 tabs for 2 days, 2 tabs for 2 days, then 1 tab for 2 days, then stop  20 tablet  0  . tiotropium (SPIRIVA) 18 MCG inhalation capsule Place 1 capsule (18 mcg total) into inhaler and inhale daily.  90 capsule  3  . [DISCONTINUED] sildenafil (VIAGRA) 50 MG tablet Take 1 tablet (50 mg total) by mouth daily as needed for erectile dysfunction.  10 tablet  0   No current facility-administered medications on file prior to visit.   Assessment/Plan: This is a pleasant, 42 year old, Billy [name], with the following issues:  #1, secondary polycythemia.  This is clearly, given by hypoventilation.  Billy [name] is currently on CPAP, and his blood counts.  Overall have continued to improve.  His overall skin texture and color is improving as well.  His edema in his lower legs are also improved, secondary to better.  Circulation.  I also feel that if Billy [name] quits smoking.  This will also improve his secondary polycythemia.   #2 thrombocytopenia-this is intermittent.  Billy [name] is asymptomatic.  We will continue to monitor this.  #3.  Followup.  We will follow back up with Billy [name] in 3 months, but before then should there be questions or concerns.

## 2012-06-25 ENCOUNTER — Encounter: Payer: Self-pay | Admitting: Emergency Medicine

## 2012-06-25 ENCOUNTER — Ambulatory Visit (INDEPENDENT_AMBULATORY_CARE_PROVIDER_SITE_OTHER): Payer: Medicare Other | Admitting: Emergency Medicine

## 2012-06-25 VITALS — BP 124/80 | HR 84 | Temp 98.3°F | Ht 73.0 in | Wt 321.2 lb

## 2012-06-25 DIAGNOSIS — J309 Allergic rhinitis, unspecified: Secondary | ICD-10-CM | POA: Insufficient documentation

## 2012-06-25 DIAGNOSIS — F17200 Nicotine dependence, unspecified, uncomplicated: Secondary | ICD-10-CM

## 2012-06-25 DIAGNOSIS — G4733 Obstructive sleep apnea (adult) (pediatric): Secondary | ICD-10-CM

## 2012-06-25 DIAGNOSIS — J449 Chronic obstructive pulmonary disease, unspecified: Secondary | ICD-10-CM

## 2012-06-25 NOTE — Assessment & Plan Note (Signed)
Severe - on good BD regimen, needs financial assistance- will work on this.  - symbicort + spiriva

## 2012-06-25 NOTE — Assessment & Plan Note (Signed)
Consider starting loratadine, he wants to avoid any new meds at this time.

## 2012-06-25 NOTE — Assessment & Plan Note (Signed)
Good compliance with CPAP 

## 2012-06-25 NOTE — Progress Notes (Signed)
  Subjective:    Patient ID: Billy [name], male    DOB: 04/25/1970, 42 y.o.   MRN: [id number] HPI 42 yo male with initial pulmonary consult during hospitalization 05/30/10 for severe OSA, pulmonary HTN, hypoxemia   ROV 05/12/11 -- OSA/OHS, severe COPD with associated hypoxemia and polycythemia. Still smokes 1 pk/day. Has been reliable with CPAP, less reliable with his BD's due to expense of his meds. Has been using it sparingly. Not wearing o2 here today - the O2 has been irritating his nose.   ROV 10/13/11 -- OSA/OHS, severe COPD with associated hypoxemia and polycythemia. Still smokes 1 pk/day. Not using his BD's - doesn;t have his BD's, can't afford them, needs to fill out his financial assistance paperwork. He ran out of symbicort 3 weeks ago. He doesn't take Spiriva every day, uses it about 2x a week. Wear his his CPAP reliably. He won't have insurance until next April.   ROV 01/11/12 -- OSA/OHS with good CPAP compliance, severe COPD with associated hypoxemia and polycythemia. Still smokes ~1 pk/day. Here for regular follow up. He is using O2 prn, was not desaturated on exam today. He uses Symbicort and Spiriva reliably now. Uses albuterol prn >> averages few times a week    Acute OV 06/07/12 --  Complains of prod cough with clear/light yellow mucus, wheezing, increased SOB x1 week - denies f/c/s, tightness.  has been out of spiriva x1 week and reports does not take regularly/  Still smoking 1/2-1 PPD . Discussed smoking cessation  Not taking Spriva on reg basis.  On disability now has insurance to cover rx. Will need to send refills to pharm.  No hemoptysis , chest pain or orthopnea, edema , calf pain , fever.   ROV 06/25/12 -- OSA/OHS with good CPAP compliance, severe COPD with associated hypoxemia and polycythemia. Still smokes ~1 pk/day. Was treated 4/11 with pred + doxy for an AE.  He was off Spiriva for a while due to cost; he has assistance getting the Symbicort. No phlebotomy for over a  year. He is having some nasal allergies -    Objective:   Physical Exam  Filed Vitals:   06/25/12 1523  BP: 124/80  Pulse: 84  Temp: 98.3 F (36.8 C)  TempSrc: Oral  Height: 6\' 1"  (1.854 m)  Weight: 321 lb 3.2 oz (145.695 kg)  SpO2: 95%   Gen: Pleasant, obese, in no distress,  normal affect  ENT: No lesions,  mouth clear,  oropharynx clear, no postnasal drip  Neck: No JVD, no TMG, no carotid bruits  Lungs: few exp wheezing .   Cardiovascular: RRR, heart sounds normal, no murmur or gallops, no peripheral edema  Musculoskeletal: No deformities, no cyanosis or clubbing  Neuro: alert, non focal  Skin: some chronic changes B LE  Assessment & Plan:  TOBACCO ABUSE Still smokes, discussed cessation  SLEEP APNEA, OBSTRUCTIVE Good compliance with CPAP  COPD Severe - on good BD regimen, needs financial assistance- will work on this.  - symbicort + spiriva  Allergic rhinitis Consider starting loratadine, he wants to avoid any new meds at this time.

## 2012-06-25 NOTE — Assessment & Plan Note (Signed)
Still smokes, discussed cessation

## 2012-06-25 NOTE — Patient Instructions (Addendum)
We will continue your current medications.  We will try to help get financial assistance for these medications.  You need to wear you oxygen with as much of your activity as possible Continue your CPAP every night We will fill a form for a handicap placard.  Follow with Dr Delton Coombes in 4 months or sooner if you have any problems.

## 2012-06-27 LAB — IRON AND TIBC
Iron: 142 ug/dL (ref 42–165)
TIBC: 337 ug/dL (ref 215–435)
UIBC: 195 ug/dL (ref 125–400)

## 2012-06-27 LAB — CARBOXYHEMOGLOBIN: Carboxyhemoglobin: 8 %TOTAL HGB (ref <=12)

## 2012-09-23 ENCOUNTER — Telehealth: Payer: Self-pay | Admitting: Hematology & Oncology

## 2012-09-23 ENCOUNTER — Other Ambulatory Visit: Payer: Medicare Other | Admitting: Lab

## 2012-09-23 ENCOUNTER — Ambulatory Visit: Payer: Medicare Other | Admitting: Hematology & Oncology

## 2012-09-23 NOTE — Telephone Encounter (Signed)
Pt called to cancel. Will cb to reschedule at a later time.

## 2012-09-26 ENCOUNTER — Telehealth: Payer: Self-pay | Admitting: Hematology & Oncology

## 2012-09-26 NOTE — Telephone Encounter (Signed)
Patient call cx 09/23/12 and resch for 10/21/12

## 2012-10-21 ENCOUNTER — Ambulatory Visit (HOSPITAL_BASED_OUTPATIENT_CLINIC_OR_DEPARTMENT_OTHER): Payer: Medicare Other | Admitting: Hematology & Oncology

## 2012-10-21 ENCOUNTER — Other Ambulatory Visit (HOSPITAL_BASED_OUTPATIENT_CLINIC_OR_DEPARTMENT_OTHER): Payer: Medicare Other | Admitting: Lab

## 2012-10-21 VITALS — BP 121/83 | HR 94 | Temp 98.1°F | Resp 20 | Ht 73.0 in | Wt 314.0 lb

## 2012-10-21 DIAGNOSIS — F17200 Nicotine dependence, unspecified, uncomplicated: Secondary | ICD-10-CM

## 2012-10-21 DIAGNOSIS — D751 Secondary polycythemia: Secondary | ICD-10-CM

## 2012-10-21 DIAGNOSIS — D45 Polycythemia vera: Secondary | ICD-10-CM

## 2012-10-21 DIAGNOSIS — J449 Chronic obstructive pulmonary disease, unspecified: Secondary | ICD-10-CM

## 2012-10-21 DIAGNOSIS — R0902 Hypoxemia: Secondary | ICD-10-CM

## 2012-10-21 LAB — CBC WITH DIFFERENTIAL (CANCER CENTER ONLY)
BASO#: 0 10*3/uL (ref 0.0–0.2)
Eosinophils Absolute: 0.3 10*3/uL (ref 0.0–0.5)
HGB: 17.6 g/dL — ABNORMAL HIGH (ref 13.0–17.1)
LYMPH#: 1.7 10*3/uL (ref 0.9–3.3)
NEUT#: 7.1 10*3/uL — ABNORMAL HIGH (ref 1.5–6.5)
RBC: 5.12 10*6/uL (ref 4.20–5.70)

## 2012-10-21 NOTE — Progress Notes (Signed)
This office note has been dictated.

## 2012-10-22 LAB — IRON AND TIBC CHCC
%SAT: 15 % — ABNORMAL LOW (ref 20–55)
Iron: 48 ug/dL (ref 42–163)
TIBC: 325 ug/dL (ref 202–409)
UIBC: 277 ug/dL (ref 117–376)

## 2012-10-22 NOTE — Progress Notes (Signed)
CC:   Leslye Peer, MD  DIAGNOSES: 1. 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 2. Obesity-hypoventilation syndrome. 3. Transient thrombocytopenia.  CURRENT THERAPY: 1. Phlebotomy as clinically indicated. 2. Aspirin 81 mg p.o. daily. 3. CPAP.  INTERIM HISTORY:  Mr. Pullman comes in for followup.  He is doing okay. Unfortunately, his smoking has gotten a little bit worse.  He says he is still using his CPAP machine.  He still gets quite short of breath with exertion.  I think he has oxygen at home.  He has had no problems with bleeding.  He has had no fever.  He has had no nausea and vomiting.  He has had no leg swelling.  PHYSICAL EXAMINATION:  General:  This is a large white gentleman in no obvious distress.  Vital signs:  Temperature of 98.1, pulse 94, respiratory rate 20, blood pressure 121/83.  Weight is 314.  Head and neck:  Normocephalic, atraumatic skull.  There are no ocular or oral lesions.  There are no palpable cervical or supraclavicular lymph nodes. Lungs:  Clear bilaterally.  He has no rales, wheezes, or rhonchi. Cardiac:  Regular rate and rhythm with a normal S1 and S2.  There are no murmurs, rubs or bruits.  Abdomen:  Soft.  He is obese.  He has good bowel sounds.  There is no fluid wave.  There is no palpable hepatosplenomegaly.  Extremities:  Show some chronic 1+ nonpitting edema of his lower legs.  Skin:  Shows facial plethora.  He has a ruddy complexion.  LABORATORY STUDIES:  White cell count is 9.7, hemoglobin 17.6, hematocrit 52.9, and platelet count 145.  IMPRESSION:  Mr. Rizzi is a 42 year old gentleman with severe chronic obstructive pulmonary disease.  He is a heavy smoker.  He is trying to cut back.  He also has hypoventilation syndrome.  He is on CPAP for this.  I am just a little disappointed that his hemoglobin is up a little bit. We have been doing a really good job in trying to get his  hemoglobin down.  I know a lot of the erythrocytosis is physiologic, which we have no control over.  I do not think he needs to be phlebotomized.  He is tough to get blood out of.  We will go ahead and plan to get him back to see [place] in about 4-6 weeks.  He is not iron deficient by any means.  His iron saturation is 42%.    ______________________________ Josph Macho, M.D. PRE/MEDQ  D:  10/21/2012  T:  10/22/2012  Job:  [id number]

## 2012-11-06 ENCOUNTER — Telehealth: Payer: Self-pay | Admitting: Emergency Medicine

## 2012-11-06 NOTE — Telephone Encounter (Signed)
Made in error. Billy [name]  °

## 2012-11-07 ENCOUNTER — Ambulatory Visit (INDEPENDENT_AMBULATORY_CARE_PROVIDER_SITE_OTHER): Payer: Medicare Other | Admitting: Emergency Medicine

## 2012-11-07 ENCOUNTER — Encounter: Payer: Self-pay | Admitting: Emergency Medicine

## 2012-11-07 VITALS — BP 124/84 | HR 84 | Temp 98.6°F | Ht 73.0 in | Wt 317.4 lb

## 2012-11-07 DIAGNOSIS — G4733 Obstructive sleep apnea (adult) (pediatric): Secondary | ICD-10-CM

## 2012-11-07 DIAGNOSIS — J449 Chronic obstructive pulmonary disease, unspecified: Secondary | ICD-10-CM

## 2012-11-07 MED ORDER — BUDESONIDE-FORMOTEROL FUMARATE 160-4.5 MCG/ACT IN AERO: 2.0000 | INHALATION_SPRAY | Freq: Two times a day (BID) | RESPIRATORY_TRACT | Status: DC | PRN

## 2012-11-07 MED ORDER — TIOTROPIUM BROMIDE MONOHYDRATE 18 MCG IN CAPS: 18.0000 ug | ORAL_CAPSULE | Freq: Every day | RESPIRATORY_TRACT | Status: DC

## 2012-11-07 NOTE — Assessment & Plan Note (Signed)
We will work to insure that you can afford and obtain your Spiriva and albuterol.  Continue your Symbicort twice a day.  We agreed today that your would cut your cigarettes down to 15 cigarettes a day.  Follow with Dr Delton Coombes in 4 months or sooner if you have any problems.

## 2012-11-07 NOTE — Assessment & Plan Note (Signed)
Continue CPAP.  

## 2012-11-07 NOTE — Patient Instructions (Addendum)
Please continue your CPAP every night.  We will work to insure that you can afford and obtain your Spiriva and albuterol.  Continue your Symbicort twice a day.  We agreed today that your would cut your cigarettes down to 15 cigarettes a day.  Follow with Dr Delton Coombes in 4 months or sooner if you have any problems.

## 2012-11-07 NOTE — Progress Notes (Signed)
  Subjective:    Patient ID: Billy [name], male    DOB: [birth year], 42 y.o.   MRN: [id number] HPI 42 yo male with initial pulmonary consult during hospitalization 05/30/10 for severe OSA, pulmonary HTN, hypoxemia   ROV 05/12/11 -- OSA/OHS, severe COPD with associated hypoxemia and polycythemia. Still smokes 1 pk/day. Has been reliable with CPAP, less reliable with his BD's due to expense of his meds. Has been using it sparingly. Not wearing o2 here today - the O2 has been irritating his nose.   ROV 10/13/11 -- OSA/OHS, severe COPD with associated hypoxemia and polycythemia. Still smokes 1 pk/day. Not using his BD's - doesn;t have his BD's, can't afford them, needs to fill out his financial assistance paperwork. He ran out of symbicort 3 weeks ago. He doesn't take Spiriva every day, uses it about 2x a week. Wear his his CPAP reliably. He won't have insurance until next April.   ROV 01/11/12 -- OSA/OHS with good CPAP compliance, severe COPD with associated hypoxemia and polycythemia. Still smokes ~1 pk/day. Here for regular follow up. He is using O2 prn, was not desaturated on exam today. He uses Symbicort and Spiriva reliably now. Uses albuterol prn >> averages few times a week    Acute OV 06/07/12 --  Complains of prod cough with clear/light yellow mucus, wheezing, increased SOB x1 week - denies f/c/s, tightness.  has been out of spiriva x1 week and reports does not take regularly/  Still smoking 1/2-1 PPD . Discussed smoking cessation  Not taking Spriva on reg basis.  On disability now has insurance to cover rx. Will need to send refills to pharm.  No hemoptysis , chest pain or orthopnea, edema , calf pain , fever.   ROV 06/25/12 -- OSA/OHS with good CPAP compliance, severe COPD with associated hypoxemia and polycythemia. Still smokes ~1 pk/day. Was treated 4/11 with pred + doxy for an AE.  He was off Spiriva for a while due to cost; he has assistance getting the Symbicort. No phlebotomy for over a  year. He is having some nasal allergies   ROV 11/07/12 -- OSA/OHS with good CPAP compliance, severe COPD with associated hypoxemia and polycythemia. Still smokes ~1 pk/day. He doesn't wear O2 with all exertion. His cough is a little worse, has a tickle in his chest. EtOH use is significant.    Objective:   Physical Exam  Filed Vitals:   11/07/12 1456  BP: 124/84  Pulse: 84  Temp: 98.6 F (37 C)  TempSrc: Oral  Height: 6\' 1"  (1.854 m)  Weight: 317 lb 6.4 oz (143.972 kg)  SpO2: 93%   Gen: Pleasant, obese, in no distress,  normal affect  ENT: No lesions,  mouth clear,  oropharynx clear, no postnasal drip  Neck: No JVD, no TMG, no carotid bruits  Lungs: few exp wheezing .   Cardiovascular: RRR, heart sounds normal, no murmur or gallops, no peripheral edema  Musculoskeletal: No deformities, no cyanosis or clubbing  Neuro: alert, non focal  Skin: some chronic changes B LE  Assessment & Plan:  COPD We will work to insure that you can afford and obtain your Spiriva and albuterol.  Continue your Symbicort twice a day.  We agreed today that your would cut your cigarettes down to 15 cigarettes a day.  Follow with Dr Delton Coombes in 4 months or sooner if you have any problems.  SLEEP APNEA, OBSTRUCTIVE Continue CPAP.

## 2012-12-04 ENCOUNTER — Telehealth: Payer: Self-pay | Admitting: Emergency Medicine

## 2012-12-04 NOTE — Telephone Encounter (Signed)
Fax pt assistant papers to [phone]

## 2012-12-05 NOTE — Telephone Encounter (Signed)
spiriva assistance papers were faxed to pt assistance company yesterday.   Forms are in Libby's office in blue folder with pt assistance forms

## 2012-12-23 ENCOUNTER — Ambulatory Visit (HOSPITAL_BASED_OUTPATIENT_CLINIC_OR_DEPARTMENT_OTHER): Payer: Medicare Other | Admitting: Hematology & Oncology

## 2012-12-23 ENCOUNTER — Other Ambulatory Visit (HOSPITAL_BASED_OUTPATIENT_CLINIC_OR_DEPARTMENT_OTHER): Payer: Medicare Other | Admitting: Lab

## 2012-12-23 VITALS — BP 114/61 | HR 100 | Temp 98.2°F | Resp 18 | Ht 73.0 in | Wt 310.0 lb

## 2012-12-23 DIAGNOSIS — D45 Polycythemia vera: Secondary | ICD-10-CM

## 2012-12-23 DIAGNOSIS — D751 Secondary polycythemia: Secondary | ICD-10-CM

## 2012-12-23 DIAGNOSIS — E662 Morbid (severe) obesity with alveolar hypoventilation: Secondary | ICD-10-CM

## 2012-12-23 DIAGNOSIS — J449 Chronic obstructive pulmonary disease, unspecified: Secondary | ICD-10-CM

## 2012-12-23 DIAGNOSIS — F17200 Nicotine dependence, unspecified, uncomplicated: Secondary | ICD-10-CM

## 2012-12-23 LAB — CBC WITH DIFFERENTIAL (CANCER CENTER ONLY)
EOS%: 3.3 % (ref 0.0–7.0)
Eosinophils Absolute: 0.3 10*3/uL (ref 0.0–0.5)
MCH: 33.9 pg — ABNORMAL HIGH (ref 28.0–33.4)
MCHC: 33.5 g/dL (ref 32.0–35.9)
MONO%: 5 % (ref 0.0–13.0)
NEUT#: 6.3 10*3/uL (ref 1.5–6.5)
Platelets: 146 10*3/uL (ref 145–400)
RBC: 5.19 10*6/uL (ref 4.20–5.70)

## 2012-12-23 LAB — IRON AND TIBC CHCC
Iron: 128 ug/dL (ref 42–163)
TIBC: 331 ug/dL (ref 202–409)

## 2012-12-23 NOTE — Progress Notes (Signed)
This office note has been dictated.

## 2012-12-24 NOTE — Progress Notes (Signed)
DIAGNOSES: 1. Secondary polycythemia. 2. Severe chronic obstructive pulmonary disease. 3. Obesity-hypoventilation syndrome. 4. Transient thrombocytopenia.  CURRENT THERAPY: 1. Aspirin 81 mg p.o. q. day. 2. CPAP for obesity-hypoventilation syndrome. 3. Phlebotomy for clinical symptoms of hyperviscosity.  INTERIM HISTORY:  Billy [name] comes in for followup.  He is doing fairly well.  Unfortunately, he is still smoking, but again, trying to cut back on this.  He does use his CPAP machine.  This does make him feel a little bit better.  He does have oxygen at home.  He does get short of breath with mild exertion.  I do not think we have phlebotomized him for a while.  He is not complaining of any headaches.  There has been no leg swelling. He has had no rashes.  There has been no change in bowel or bladder habits.  When we last saw him in August, his ferritin was 343 with an iron saturation of 15%.  PHYSICAL EXAM:  General:  This is an obese white gentleman in no obvious distress.  Vital signs:  Temperature of 98.2, pulse 100, respiratory rate 18, blood pressure 114/61.  Weight is 310 pounds.  Head and neck: Normocephalic, atraumatic skull.  There are no ocular or oral lesions. There are no palpable cervical or supraclavicular lymph nodes.  Lungs: Clear bilaterally.  Cardiac:  Regular rate and rhythm with no murmurs, rubs, or bruits.  Abdomen:  Obese, but soft.  He has good bowel sounds. There is no fluid wave.  There is no palpable hepatosplenomegaly.  Back: No tenderness over the spine, ribs, or hips.  Extremities:  Some stasis dermatitis type changes in the lower legs.  He has good strength in his legs and arms.  He has good range of motion of his joints. Neurological:  No focal neurological deficits.  LABORATORY STUDIES:  White cell count is 9, hemoglobin 17.6, hematocrit 52.5, platelet count 146.  MCV is 101.  IMPRESSION:  Billy [name] is a 42 year old gentleman with  obesity- hypoventilation syndrome.  He has secondary polycythemia from this.  He also has pretty bad chronic obstructive pulmonary disease.  He is stable.  He feels well.  As such, we will hold off on doing any kind of phlebotomies on him right now.  We will go ahead and plan to get him home to see [place] in another 6 weeks or so.  I think if he continues to cut back on smoking, this will certainly help with his erythrocytosis.    ______________________________ Josph Macho, M.D. PRE/MEDQ  D:  12/23/2012  T:  12/24/2012  Job:  [id number]

## 2013-02-03 ENCOUNTER — Other Ambulatory Visit: Payer: Medicare Other | Admitting: Lab

## 2013-02-03 ENCOUNTER — Telehealth: Payer: Self-pay | Admitting: Hematology & Oncology

## 2013-02-03 ENCOUNTER — Ambulatory Visit: Payer: Medicare Other | Admitting: Hematology & Oncology

## 2013-02-03 NOTE — Telephone Encounter (Signed)
Pt moved 12-8 to 1-5 due to transportation issues

## 2013-02-06 ENCOUNTER — Telehealth: Payer: Self-pay | Admitting: Hematology & Oncology

## 2013-02-06 NOTE — Telephone Encounter (Signed)
Left message moved 1-5 to 1-2

## 2013-02-28 ENCOUNTER — Other Ambulatory Visit: Payer: Medicare Other | Admitting: Lab

## 2013-02-28 ENCOUNTER — Ambulatory Visit: Payer: Medicare Other | Admitting: Hematology & Oncology

## 2013-02-28 DIAGNOSIS — D45 Polycythemia vera: Secondary | ICD-10-CM

## 2013-03-03 ENCOUNTER — Ambulatory Visit: Payer: Medicare Other | Admitting: Hematology & Oncology

## 2013-03-03 ENCOUNTER — Other Ambulatory Visit: Payer: Medicare Other | Admitting: Lab

## 2013-03-07 ENCOUNTER — Ambulatory Visit (INDEPENDENT_AMBULATORY_CARE_PROVIDER_SITE_OTHER): Payer: Medicare Other | Admitting: Emergency Medicine

## 2013-03-07 ENCOUNTER — Encounter: Payer: Self-pay | Admitting: Emergency Medicine

## 2013-03-07 VITALS — BP 140/82 | HR 87 | Ht 73.0 in | Wt 311.0 lb

## 2013-03-07 DIAGNOSIS — G4733 Obstructive sleep apnea (adult) (pediatric): Secondary | ICD-10-CM

## 2013-03-07 DIAGNOSIS — J449 Chronic obstructive pulmonary disease, unspecified: Secondary | ICD-10-CM

## 2013-03-07 DIAGNOSIS — F17200 Nicotine dependence, unspecified, uncomplicated: Secondary | ICD-10-CM

## 2013-03-07 NOTE — Assessment & Plan Note (Signed)
Good compliance w CPAP 

## 2013-03-07 NOTE — Patient Instructions (Signed)
Please stop Spiriva for now Take either Symbicort OR Advair twice a day. Do not take both.  Walking oximetry today Wear your CPAP every night Follow with Dr Lamonte Sakai in 6 months or sooner if you have any problems

## 2013-03-07 NOTE — Progress Notes (Signed)
Subjective:    Patient ID: Billy [name], male    DOB: [birth year], 43 y.o.   MRN: [id number] HPI 43 yo male with initial pulmonary consult during hospitalization 05/30/10 for severe OSA, pulmonary HTN, hypoxemia   ROV 05/12/11 -- OSA/OHS, severe COPD with associated hypoxemia and polycythemia. Still smokes 1 pk/day. Has been reliable with CPAP, less reliable with his BD's due to expense of his meds. Has been using it sparingly. Not wearing o2 here today - the O2 has been irritating his nose.   ROV 10/13/11 -- OSA/OHS, severe COPD with associated hypoxemia and polycythemia. Still smokes 1 pk/day. Not using his BD's - doesn;t have his BD's, can't afford them, needs to fill out his financial assistance paperwork. He ran out of symbicort 3 weeks ago. He doesn't take Spiriva every day, uses it about 2x a week. Wear his his CPAP reliably. He won't have insurance until next April.   ROV 01/11/12 -- OSA/OHS with good CPAP compliance, severe COPD with associated hypoxemia and polycythemia. Still smokes ~1 pk/day. Here for regular follow up. He is using O2 prn, was not desaturated on exam today. He uses Symbicort and Spiriva reliably now. Uses albuterol prn >> averages few times a week    Acute OV 06/07/12 --  Complains of prod cough with clear/light yellow mucus, wheezing, increased SOB x1 week - denies f/c/s, tightness.  has been out of spiriva x1 week and reports does not take regularly/  Still smoking 1/2-1 PPD . Discussed smoking cessation  Not taking Spriva on reg basis.  On disability now has insurance to cover rx. Will need to send refills to pharm.  No hemoptysis , chest pain or orthopnea, edema , calf pain , fever.   ROV 06/25/12 -- OSA/OHS with good CPAP compliance, severe COPD with associated hypoxemia and polycythemia. Still smokes ~1 pk/day. Was treated 4/11 with pred + doxy for an AE.  He was off Spiriva for a while due to cost; he has assistance getting the Symbicort. No phlebotomy for over a  year. He is having some nasal allergies   ROV 11/07/12 -- OSA/OHS with good CPAP compliance, severe COPD with associated hypoxemia and polycythemia. Still smokes ~1 pk/day. He doesn't wear O2 with all exertion. His cough is a little worse, has a tickle in his chest. EtOH use is significant.   ROV 03/05/13 -- OSA/OHS with good CPAP compliance, severe COPD with associated hypoxemia and polycythemia. Medication compliance and affordability are issues - unable to get his meds, hasn't been able to get Spiriva so has never taken more than 10 days straight. Using albuterol a few times a week. He has had enough Symbicort to stay reliable with it. He feels that his breathing is pretty good, does have exertional limitations. Hasn't had to have phlebotomy in over a year.    Objective:   Physical Exam  Filed Vitals:   03/07/13 1434  BP: 140/82  Pulse: 87  Height: 6\' 1"  (1.854 m)  Weight: 311 lb (141.069 kg)  SpO2: 92%   Gen: Pleasant, obese, in no distress,  normal affect  ENT: No lesions,  mouth clear,  oropharynx clear, no postnasal drip  Neck: No JVD, no TMG, no carotid bruits  Lungs: few exp wheezing .   Cardiovascular: RRR, heart sounds normal, no murmur or gallops, no peripheral edema  Musculoskeletal: No deformities, no cyanosis or clubbing  Neuro: alert, non focal  Skin: some chronic changes B LE   Assessment & Plan:  SLEEP APNEA,  OBSTRUCTIVE Good compliance w CPAP.   TOBACCO ABUSE He has cut down to 15 cigarettes a day on most days  COPD Need to work on getting meds reliably.  - has not been able to get spiriva, will look into this but for now will stick with ICS/LABA alone.  - we have kept in supply of symbicort.  - will try to interchange Advair (which he has) with Symbicort.  - rov 6

## 2013-03-07 NOTE — Assessment & Plan Note (Signed)
He has cut down to 15 cigarettes a day on most days

## 2013-03-07 NOTE — Assessment & Plan Note (Signed)
Need to work on getting meds reliably.  - has not been able to get spiriva, will look into this but for now will stick with ICS/LABA alone.  - we have kept in supply of symbicort.  - will try to interchange Advair (which he has) with Symbicort.  - rov 6

## 2013-03-10 ENCOUNTER — Encounter: Payer: Self-pay | Admitting: Hematology & Oncology

## 2013-03-10 ENCOUNTER — Ambulatory Visit (HOSPITAL_BASED_OUTPATIENT_CLINIC_OR_DEPARTMENT_OTHER): Payer: Medicare Other | Admitting: Hematology & Oncology

## 2013-03-10 ENCOUNTER — Other Ambulatory Visit (HOSPITAL_BASED_OUTPATIENT_CLINIC_OR_DEPARTMENT_OTHER): Payer: Medicare Other | Admitting: Lab

## 2013-03-10 VITALS — BP 128/80 | HR 85 | Temp 98.3°F | Resp 20 | Ht 73.0 in | Wt 310.0 lb

## 2013-03-10 DIAGNOSIS — D751 Secondary polycythemia: Secondary | ICD-10-CM

## 2013-03-10 DIAGNOSIS — F17200 Nicotine dependence, unspecified, uncomplicated: Secondary | ICD-10-CM

## 2013-03-10 DIAGNOSIS — J449 Chronic obstructive pulmonary disease, unspecified: Secondary | ICD-10-CM

## 2013-03-10 DIAGNOSIS — D45 Polycythemia vera: Secondary | ICD-10-CM

## 2013-03-10 DIAGNOSIS — D696 Thrombocytopenia, unspecified: Secondary | ICD-10-CM

## 2013-03-10 DIAGNOSIS — E662 Morbid (severe) obesity with alveolar hypoventilation: Secondary | ICD-10-CM

## 2013-03-10 LAB — CBC WITH DIFFERENTIAL (CANCER CENTER ONLY)
BASO#: 0 10*3/uL (ref 0.0–0.2)
BASO%: 0.5 % (ref 0.0–2.0)
EOS%: 3.8 % (ref 0.0–7.0)
Eosinophils Absolute: 0.3 10*3/uL (ref 0.0–0.5)
HEMATOCRIT: 54.1 % — AB (ref 38.7–49.9)
HGB: 17.3 g/dL — ABNORMAL HIGH (ref 13.0–17.1)
LYMPH#: 1.6 10*3/uL (ref 0.9–3.3)
LYMPH%: 19 % (ref 14.0–48.0)
MCH: 32.8 pg (ref 28.0–33.4)
MCHC: 32 g/dL (ref 32.0–35.9)
MCV: 103 fL — ABNORMAL HIGH (ref 82–98)
MONO#: 0.8 10*3/uL (ref 0.1–0.9)
MONO%: 9.3 % (ref 0.0–13.0)
NEUT#: 5.7 10*3/uL (ref 1.5–6.5)
NEUT%: 67.4 % (ref 40.0–80.0)
Platelets: 143 10*3/uL — ABNORMAL LOW (ref 145–400)
RBC: 5.28 10*6/uL (ref 4.20–5.70)
RDW: 12.8 % (ref 11.1–15.7)
WBC: 8.4 10*3/uL (ref 4.0–10.0)

## 2013-03-10 LAB — IRON AND TIBC CHCC
%SAT: 38 % (ref 20–55)
Iron: 128 ug/dL (ref 42–163)
TIBC: 339 ug/dL (ref 202–409)
UIBC: 211 ug/dL (ref 117–376)

## 2013-03-10 LAB — FERRITIN CHCC: Ferritin: 389 ng/ml — ABNORMAL HIGH (ref 22–316)

## 2013-03-10 NOTE — Progress Notes (Signed)
This office note has been dictated.

## 2013-03-10 NOTE — Patient Instructions (Signed)
Smoking Cessation, Tips for Success If you are ready to quit smoking, congratulations! You have chosen to help yourself be healthier. Cigarettes bring nicotine, tar, carbon monoxide, and other irritants into your body. Your lungs, heart, and blood vessels will be able to work better without these poisons. There are many different ways to quit smoking. Nicotine gum, nicotine patches, a nicotine inhaler, or nicotine nasal spray can help with physical craving. Hypnosis, support groups, and medicines help break the habit of smoking. WHAT THINGS CAN I DO TO MAKE QUITTING EASIER?  Here are some tips to help you quit for good:  Pick a date when you will quit smoking completely. Tell all of your friends and family about your plan to quit on that date.  Do not try to slowly cut down on the number of cigarettes you are smoking. Pick a quit date and quit smoking completely starting on that day.  Throw away all cigarettes.   Clean and remove all ashtrays from your home, work, and car.   On a card, write down your reasons for quitting. Carry the card with you and read it when you get the urge to smoke.   Cleanse your body of nicotine. Drink enough water and fluids to keep your urine clear or pale yellow. Do this after quitting to flush the nicotine from your body.   Learn to predict your moods. Do not let a bad situation be your excuse to have a cigarette. Some situations in your life might tempt you into wanting a cigarette.   Never have "just one" cigarette. It leads to wanting another and another. Remind yourself of your decision to quit.   Change habits associated with smoking. If you smoked while driving or when feeling stressed, try other activities to replace smoking. Stand up when drinking your coffee. Brush your teeth after eating. Sit in a different chair when you read the paper. Avoid alcohol while trying to quit, and try to drink fewer caffeinated beverages. Alcohol and caffeine may urge  you to smoke.   Avoid foods and drinks that can trigger a desire to smoke, such as sugary or spicy foods and alcohol.   Ask people who smoke not to smoke around you.   Have something planned to do right after eating or having a cup of coffee. For example, plan to take a walk or exercise.   Try a relaxation exercise to calm you down and decrease your stress. Remember, you may be tense and nervous for the first 2 weeks after you quit, but this will pass.   Find new activities to keep your hands busy. Play with a pen, coin, or rubber band. Doodle or draw things on paper.   Brush your teeth right after eating. This will help cut down on the craving for the taste of tobacco after meals. You can also try mouthwash.   Use oral substitutes in place of cigarettes. Try using lemon drops, carrots, cinnamon sticks, or chewing gum. Keep them handy so they are available when you have the urge to smoke.   When you have the urge to smoke, try deep breathing.   Designate your home as a nonsmoking area.   If you are a heavy smoker, ask your health care provider about a prescription for nicotine chewing gum. It can ease your withdrawal from nicotine.   Reward yourself. Set aside the cigarette money you save and buy yourself something nice.   Look for support from others. Join a support group or   smoking cessation program. Ask someone at home or at work to help you with your plan to quit smoking.   Always ask yourself, "Do I need this cigarette or is this just a reflex?" Tell yourself, "Today, I choose not to smoke," or "I do not want to smoke." You are reminding yourself of your decision to quit.  Do not replace cigarette smoking with electronic cigarettes (commonly called e-cigarettes). The safety of e-cigarettes is unknown, and some may contain harmful chemicals.  If you relapse, do not give up! Plan ahead and think about what you will do the next time you get the urge to smoke.  HOW WILL  I FEEL WHEN I QUIT SMOKING? You may have symptoms of withdrawal because your body is used to nicotine (the addictive substance in cigarettes). You may crave cigarettes, be irritable, feel very hungry, cough often, get headaches, or have difficulty concentrating. The withdrawal symptoms are only temporary. They are strongest when you first quit but will go away within 10 14 days. When withdrawal symptoms occur, stay in control. Think about your reasons for quitting. Remind yourself that these are signs that your body is healing and getting used to being without cigarettes. Remember that withdrawal symptoms are easier to treat than the major diseases that smoking can cause.  Even after the withdrawal is over, expect periodic urges to smoke. However, these cravings are generally short lived and will go away whether you smoke or not. Do not smoke!  WHAT RESOURCES ARE AVAILABLE TO HELP ME QUIT SMOKING? Your health care provider can direct you to community resources or hospitals for support, which may include:  Group support.  Education.  Hypnosis.  Therapy. Document Released: 11/12/2003 Document Revised: 12/04/2012 Document Reviewed: 08/01/2012 [employer] Patient Information 2014 [employer].  

## 2013-03-11 NOTE — Progress Notes (Signed)
DIAGNOSIS: 1. Secondary polycythemia. 2. Severe chronic obstructive pulmonary disease. 3. Obesity, hypoventilation syndrome. 4. Chronic mild thrombocytopenia.  CURRENT THERAPY: 1. CPAP for obesity-hypoventilation syndrome. 2. Aspirin 81 mg p.o. daily.  INTERIM HISTORY:  Billy [name] comes in for followup.  He is improving slowly, but surely.  He still cannot do all that much because of dyspnea.  He does get tired quite easily.  He is pretty diligent with using the CPAP at 9.  He is trying to cut back on smoking.  I think he probably smokes half a pack per day now.  He has had no fever, sweats, or chills.  He has had no problems with skin issues.  When we last saw him in October, his ferritin was 342 with an iron saturation of 39%.  He really does not like being phlebotomized.  As such, we are just watching his blood counts and see how they trended downward with [place] modifying other risk factors.  PHYSICAL EXAMINATION:  General:  This is a obese white gentleman, in no obvious distress.  He does have some shortness of breath with extensive talking.  Vital Signs:  Temperature of 98.3, pulse 85, respiratory 20, blood pressure 128/80, weight is 310 pounds.  Head and Neck: Normocephalic, atraumatic skull.  There are no ocular or oral lesions. There are no palpable cervical or supraclavicular lymph nodes.  Lungs: With decreased breath sounds throughout all lung fields.  He has some wheezing.  He has some crackles bilaterally.  Cardiac:  Regular rate and rhythm with a normal S1 and S2.  There are no murmurs, rubs, or bruits. Abdomen:  Soft.  He has good bowel sounds.  There is no palpable abdominal mass.  He is morbidly obese.  There is no palpable hepatosplenomegaly.  Extremities:  Shows pound some chronic trace edema in his lower legs.  There is some slight stasis dermatitis changes in his lower legs.  No cellulitis noted.  Neurologic:  No focal neurological  deficits.  LABORATORY STUDIES:  White cell count is 8.4, hemoglobin 17.3, hematocrit 54.1, platelet count 143.  Ferritin is 389 with iron saturation of 38%.  IMPRESSION:  Billy [name] is a 43 year old gentleman.  He presented with incredible erythrocytosis when I first saw him.  I probably saw him a good 2 years or so ago.  I think when we first saw him, his hemoglobin was about 23 or 24.  He was incredibly cyanotic.  We finally have been able to get him to the point where he is somewhat functional.  He continues to use oxygen at home.  He would like to work, but he really just cannot because of his lung function.  For now, we will go ahead and plan to get him back in another couple of months.  I do not see a need to phlebotomize him right now.    ______________________________ Volanda Napoleon, M.D. PRE/MEDQ  D:  03/10/2013  T:  03/11/2013  Job:  [id number]

## 2013-04-25 ENCOUNTER — Telehealth: Payer: Self-pay | Admitting: Emergency Medicine

## 2013-04-25 NOTE — Telephone Encounter (Signed)
Pt states that he does not feel advair is not working for him and he would like to go back on sybicort 160, spiriva and albuterol rescue inhalers.  States he will have insurance for rx's 04/27/13 and would like these sent to his pharm.  Is this ok with you Dr. Lamonte Sakai? Please advise, thanks

## 2013-04-28 MED ORDER — ALBUTEROL SULFATE HFA 108 (90 BASE) MCG/ACT IN AERS: 2.0000 | INHALATION_SPRAY | Freq: Four times a day (QID) | RESPIRATORY_TRACT | Status: DC | PRN

## 2013-04-28 MED ORDER — TIOTROPIUM BROMIDE MONOHYDRATE 18 MCG IN CAPS: 18.0000 ug | ORAL_CAPSULE | Freq: Every day | RESPIRATORY_TRACT | Status: DC

## 2013-04-28 MED ORDER — BUDESONIDE-FORMOTEROL FUMARATE 160-4.5 MCG/ACT IN AERO: 2.0000 | INHALATION_SPRAY | Freq: Two times a day (BID) | RESPIRATORY_TRACT | Status: DC

## 2013-04-28 NOTE — Telephone Encounter (Signed)
Per RB ok to change back to symbicort, spiriva and proair pt aware these sent to pharm.  Nothing further needed

## 2013-04-28 NOTE — Telephone Encounter (Signed)
Yes thank you please make the switch,.

## 2013-06-06 ENCOUNTER — Other Ambulatory Visit: Payer: Self-pay | Admitting: *Deleted

## 2013-06-06 DIAGNOSIS — D45 Polycythemia vera: Secondary | ICD-10-CM

## 2013-06-09 ENCOUNTER — Ambulatory Visit (HOSPITAL_BASED_OUTPATIENT_CLINIC_OR_DEPARTMENT_OTHER): Payer: Medicare HMO | Admitting: Hematology & Oncology

## 2013-06-09 ENCOUNTER — Other Ambulatory Visit (HOSPITAL_BASED_OUTPATIENT_CLINIC_OR_DEPARTMENT_OTHER): Payer: Medicare HMO | Admitting: Lab

## 2013-06-09 ENCOUNTER — Encounter: Payer: Self-pay | Admitting: Hematology & Oncology

## 2013-06-09 VITALS — BP 117/71 | HR 92 | Temp 98.1°F | Resp 18 | Ht 73.0 in | Wt 308.0 lb

## 2013-06-09 DIAGNOSIS — D45 Polycythemia vera: Secondary | ICD-10-CM

## 2013-06-09 DIAGNOSIS — D696 Thrombocytopenia, unspecified: Secondary | ICD-10-CM

## 2013-06-09 DIAGNOSIS — E669 Obesity, unspecified: Secondary | ICD-10-CM

## 2013-06-09 DIAGNOSIS — D751 Secondary polycythemia: Secondary | ICD-10-CM

## 2013-06-09 DIAGNOSIS — N529 Male erectile dysfunction, unspecified: Secondary | ICD-10-CM

## 2013-06-09 DIAGNOSIS — J449 Chronic obstructive pulmonary disease, unspecified: Secondary | ICD-10-CM

## 2013-06-09 LAB — CBC WITH DIFFERENTIAL (CANCER CENTER ONLY)
BASO#: 0 10*3/uL (ref 0.0–0.2)
BASO%: 0.4 % (ref 0.0–2.0)
EOS%: 3.1 % (ref 0.0–7.0)
Eosinophils Absolute: 0.3 10*3/uL (ref 0.0–0.5)
HEMATOCRIT: 52.1 % — AB (ref 38.7–49.9)
HGB: 17.5 g/dL — ABNORMAL HIGH (ref 13.0–17.1)
LYMPH#: 1.8 10*3/uL (ref 0.9–3.3)
LYMPH%: 19.8 % (ref 14.0–48.0)
MCH: 34 pg — AB (ref 28.0–33.4)
MCHC: 33.6 g/dL (ref 32.0–35.9)
MCV: 101 fL — ABNORMAL HIGH (ref 82–98)
MONO#: 0.6 10*3/uL (ref 0.1–0.9)
MONO%: 6.6 % (ref 0.0–13.0)
NEUT#: 6.5 10*3/uL (ref 1.5–6.5)
NEUT%: 70.1 % (ref 40.0–80.0)
Platelets: 144 10*3/uL — ABNORMAL LOW (ref 145–400)
RBC: 5.14 10*6/uL (ref 4.20–5.70)
RDW: 13 % (ref 11.1–15.7)
WBC: 9.3 10*3/uL (ref 4.0–10.0)

## 2013-06-09 LAB — CHCC SATELLITE - SMEAR

## 2013-06-09 MED ORDER — TADALAFIL 20 MG PO TABS: 20.0000 mg | ORAL_TABLET | Freq: Every day | ORAL | Status: DC | PRN

## 2013-06-09 NOTE — Patient Instructions (Signed)
You Can Quit Smoking If you are ready to quit smoking or are thinking about it, congratulations! You have chosen to help yourself be healthier and live longer! There are lots of different ways to quit smoking. Nicotine gum, nicotine patches, a nicotine inhaler, or nicotine nasal spray can help with physical craving. Hypnosis, support groups, and medicines help break the habit of smoking. TIPS TO GET OFF AND STAY OFF CIGARETTES  Learn to predict your moods. Do not let a bad situation be your excuse to have a cigarette. Some situations in your life might tempt you to have a cigarette.  Ask friends and co-workers not to smoke around you.  Make your home smoke-free.  Never have "just one" cigarette. It leads to wanting another and another. Remind yourself of your decision to quit.  On a card, make a list of your reasons for not smoking. Read it at least the same number of times a day as you have a cigarette. Tell yourself everyday, "I do not want to smoke. I choose not to smoke."  Ask someone at home or work to help you with your plan to quit smoking.  Have something planned after you eat or have a cup of coffee. Take a walk or get other exercise to perk you up. This will help to keep you from overeating.  Try a relaxation exercise to calm you down and decrease your stress. Remember, you may be tense and nervous the first two weeks after you quit. This will pass.  Find new activities to keep your hands busy. Play with a pen, coin, or rubber band. Doodle or draw things on paper.  Brush your teeth right after eating. This will help cut down the craving for the taste of tobacco after meals. You can try mouthwash too.  Try gum, breath mints, or diet candy to keep something in your mouth. IF YOU SMOKE AND WANT TO QUIT:  Do not stock up on cigarettes. Never buy a carton. Wait until one pack is finished before you buy another.  Never carry cigarettes with you at work or at home.  Keep cigarettes  as far away from you as possible. Leave them with someone else.  Never carry matches or a lighter with you.  Ask yourself, "Do I need this cigarette or is this just a reflex?"  Bet with someone that you can quit. Put cigarette money in a piggy bank every morning. If you smoke, you give up the money. If you do not smoke, by the end of the week, you keep the money.  Keep trying. It takes 21 days to change a habit!  Talk to your doctor about using medicines to help you quit. These include nicotine replacement gum, lozenges, or skin patches. Document Released: 12/10/2008 Document Revised: 05/08/2011 Document Reviewed: 12/10/2008 [employer] Patient Information 2014 [employer].  

## 2013-06-10 LAB — IRON AND TIBC CHCC
%SAT: 34 % (ref 20–55)
IRON: 113 ug/dL (ref 42–163)
TIBC: 334 ug/dL (ref 202–409)
UIBC: 221 ug/dL (ref 117–376)

## 2013-06-10 LAB — FERRITIN CHCC: FERRITIN: 367 ng/mL — AB (ref 22–316)

## 2013-06-10 NOTE — Progress Notes (Signed)
Hematology and Oncology Follow Up Visit  ROBEL WUERTZ [id number] 08-13-1970 43 y.o. 06/10/2013   Principle Diagnosis:  . Secondary polycythemia. 2. Severe chronic obstructive pulmonary disease. 3. Obesity, hypoventilation syndrome. 4. Chronic mild thrombocytopenia.  Current Therapy:   . CPAP for obesity-hypoventilation syndrome. 2. Aspirin 81 mg p.o. daily.     Interim History:  Mr.  Eschbach is back for followup. We saw him back in January. He's been doing okay. He is using the CPAP. He's cut back on his tobacco use. He says he will get off cigarettes himself.  She's had no headache. He's had no cough or increased shortness of breath.  Is no abdominal pain. He's had some dry skin. He's had no real swelling of his legs.  He's had no fever. There's been no obvious bleeding.  Medications: Current outpatient prescriptions:albuterol (PROVENTIL HFA) 108 (90 BASE) MCG/ACT inhaler, 1-2 puffs very 4-6 hours as needed, Disp: 18 g, Rfl: 5;  aspirin 81 MG tablet, Take 81 mg by mouth daily.  , Disp: , Rfl: ;  budesonide-formoterol (SYMBICORT) 160-4.5 MCG/ACT inhaler, Inhale 2 puffs into the lungs 2 (two) times daily., Disp: 1 Inhaler, Rfl: 6 tiotropium (SPIRIVA) 18 MCG inhalation capsule, Place 1 capsule (18 mcg total) into inhaler and inhale daily., Disp: 30 capsule, Rfl: 6;  tadalafil (CIALIS) 20 MG tablet, Take 1 tablet (20 mg total) by mouth daily as needed for erectile dysfunction., Disp: 10 tablet, Rfl: 0;  [DISCONTINUED] sildenafil (VIAGRA) 50 MG tablet, Take 1 tablet (50 mg total) by mouth daily as needed for erectile dysfunction., Disp: 10 tablet, Rfl: 0  Allergies: No Known Allergies  Past Medical History, Surgical history, Social history, and Family History were reviewed and updated.  Review of Systems: As above  Physical Exam:  height is 6\' 1"  (1.854 m) and weight is 308 lb (139.708 kg). His oral temperature is 98.1 F (36.7 C). His blood pressure is 117/71 and his pulse is 92.  His respiration is 18.   Obese white gentleman in no obvious distress. Head exam shows some facial plethora. He has no oral lesions. Conjunctiva are injected. Lungs are with some decrease at the bases. Cardiac exam regular in rhythm. Has a 1/6 systolic murmur. Abdomen is obese but soft. There is no palpable liver or spleen tip. Extremities shows chronic stasis dermatitis changes. He has 1+ chronic edema. He has good range of motion of his joints. Has good strength in upper lower extremities. He has decent pulses in the distal stomach. Skin exam shows no ecchymosis or petechia. Neurological exam is nonfocal.  Lab Results  Component Value Date   WBC 9.3 06/09/2013   HGB 17.5* 06/09/2013   HCT 52.1* 06/09/2013   MCV 101* 06/09/2013   PLT 144* 06/09/2013     Chemistry      Component Value Date/Time   NA 135 04/28/2011 1444   K 3.8 04/28/2011 1444   CL 98 04/28/2011 1444   CO2 33* 04/28/2011 1444   BUN 13 04/28/2011 1444   CREATININE 0.89 04/28/2011 1444      Component Value Date/Time   CALCIUM 9.4 04/28/2011 1444   ALKPHOS 67 04/28/2011 1444   AST 19 04/28/2011 1444   ALT 26 04/28/2011 1444   BILITOT 0.6 04/28/2011 1444         Impression and Plan: Mr. Prasad is a 43 year old gentleman. He has secondary polycythemia. He has obesity and hypoventilation syndrome. The CPAP is helping. His cutting back on cigarettes is really helping.  He  really does not like to be phlebotomized. I don't think we have a problem with that right now.  He has been asymptomatic despite his erythrocytosis.  His platelet count is improving will but. I suspect that this might be reflective of a low iron state.  I'll plan to get him back in another 2 months.  I don't see that we have to get back any sooner for any lab work.   Volanda Napoleon, MD 4/14/20152:20 PM

## 2013-10-06 ENCOUNTER — Other Ambulatory Visit: Payer: Medicare HMO | Admitting: Lab

## 2013-10-06 ENCOUNTER — Ambulatory Visit: Payer: Medicare HMO | Admitting: Hematology & Oncology

## 2013-10-06 ENCOUNTER — Telehealth: Payer: Self-pay | Admitting: Hematology & Oncology

## 2013-10-06 NOTE — Telephone Encounter (Signed)
Patient called and cx 10/06/13 apt due to being sick. He resch for 11/17/13.  Nurse Dorian Pod was notified and stated she would tell Manuela Schwartz

## 2013-10-28 ENCOUNTER — Ambulatory Visit (INDEPENDENT_AMBULATORY_CARE_PROVIDER_SITE_OTHER): Payer: Medicare HMO | Admitting: Emergency Medicine

## 2013-10-28 ENCOUNTER — Encounter: Payer: Self-pay | Admitting: Emergency Medicine

## 2013-10-28 VITALS — BP 132/84 | HR 93 | Ht 73.0 in | Wt 286.0 lb

## 2013-10-28 DIAGNOSIS — J449 Chronic obstructive pulmonary disease, unspecified: Secondary | ICD-10-CM

## 2013-10-28 DIAGNOSIS — F17200 Nicotine dependence, unspecified, uncomplicated: Secondary | ICD-10-CM

## 2013-10-28 DIAGNOSIS — G4733 Obstructive sleep apnea (adult) (pediatric): Secondary | ICD-10-CM

## 2013-10-28 NOTE — Addendum Note (Signed)
Addended by: Maurice March on: 10/28/2013 09:44 AM   Modules accepted: Orders

## 2013-10-28 NOTE — Assessment & Plan Note (Signed)
Continues to smoke. Thinking about quitting, but no plan to do so yet. Asked him to discuss with me when he is ready

## 2013-10-28 NOTE — Assessment & Plan Note (Signed)
Continue Spiriva and Symbicort Offered him the flu shot today he declines Discussed smoking cessation rov 6

## 2013-10-28 NOTE — Patient Instructions (Signed)
Please continue your inhaled medications as your taking them Continue to work on cutting down your cigarettes Continue your CPAP every night. We will work on getting you a nasal mask Follow with Dr Lamonte Sakai in 6 months or sooner if you have any problems

## 2013-10-28 NOTE — Progress Notes (Signed)
Subjective:    Patient ID: Billy [name], male    DOB: Jul 19, 1970, 44 y.o.   MRN: [id number] HPI 43 yo male with initial pulmonary consult during hospitalization 05/30/10 for severe OSA, pulmonary HTN, hypoxemia   ROV 05/12/11 -- OSA/OHS, severe COPD with associated hypoxemia and polycythemia. Still smokes 1 pk/day. Has been reliable with CPAP, less reliable with his BD's due to expense of his meds. Has been using it sparingly. Not wearing o2 here today - the O2 has been irritating his nose.   ROV 10/13/11 -- OSA/OHS, severe COPD with associated hypoxemia and polycythemia. Still smokes 1 pk/day. Not using his BD's - doesn;t have his BD's, can't afford them, needs to fill out his financial assistance paperwork. He ran out of symbicort 3 weeks ago. He doesn't take Spiriva every day, uses it about 2x a week. Wear his his CPAP reliably. He won't have insurance until next April.   ROV 01/11/12 -- OSA/OHS with good CPAP compliance, severe COPD with associated hypoxemia and polycythemia. Still smokes ~1 pk/day. Here for regular follow up. He is using O2 prn, was not desaturated on exam today. He uses Symbicort and Spiriva reliably now. Uses albuterol prn >> averages few times a week    Acute OV 06/07/12 --  Complains of prod cough with clear/light yellow mucus, wheezing, increased SOB x1 week - denies f/c/s, tightness.  has been out of spiriva x1 week and reports does not take regularly/  Still smoking 1/2-1 PPD . Discussed smoking cessation  Not taking Spriva on reg basis.  On disability now has insurance to cover rx. Will need to send refills to pharm.  No hemoptysis , chest pain or orthopnea, edema , calf pain , fever.   ROV 06/25/12 -- OSA/OHS with good CPAP compliance, severe COPD with associated hypoxemia and polycythemia. Still smokes ~1 pk/day. Was treated 4/11 with pred + doxy for an AE.  He was off Spiriva for a while due to cost; he has assistance getting the Symbicort. No phlebotomy for over a  year. He is having some nasal allergies   ROV 11/07/12 -- OSA/OHS with good CPAP compliance, severe COPD with associated hypoxemia and polycythemia. Still smokes ~1 pk/day. He doesn't wear O2 with all exertion. His cough is a little worse, has a tickle in his chest. EtOH use is significant.   ROV 03/05/13 -- OSA/OHS with good CPAP compliance, severe COPD with associated hypoxemia and polycythemia. Medication compliance and affordability are issues - unable to get his meds, hasn't been able to get Spiriva so has never taken more than 10 days straight. Using albuterol a few times a week. He has had enough Symbicort to stay reliable with it. He feels that his breathing is pretty good, does have exertional limitations. Hasn't had to have phlebotomy in over a year.   ROV 10/28/13 -- follows for OSA/OHS with good CPAP compliance, severe COPD with associated hypoxemia and polycythemia. He returns today for regular followup.. Continues to smoke - less than before. He is having trouble with his CPAP > seems to be contributing to some loosening of his lower teeth. He has lost some weight > 23 lbs. His breathing has benefited. He is taking his Spiriva and Symbicort reliably. Not using O2.   His Mom has [employer]'s    Objective:   Physical Exam  Filed Vitals:   10/28/13 0920  BP: 132/84  Pulse: 93  Height: 6\' 1"  (1.854 m)  Weight: 286 lb (129.729 kg)  SpO2: 94%  Gen: Pleasant, obese, in no distress,  normal affect  ENT: No lesions,  mouth clear,  oropharynx clear, no postnasal drip  Neck: No JVD, no TMG, no carotid bruits  Lungs: clear B, no wheezes  Cardiovascular: RRR, heart sounds normal, no murmur or gallops, no peripheral edema  Musculoskeletal: No deformities, no cyanosis or clubbing  Neuro: alert, non focal  Skin: some chronic changes B LE   Assessment & Plan:  TOBACCO ABUSE Continues to smoke. Thinking about quitting, but no plan to do so yet. Asked him to discuss with me when he is  ready  COPD Continue Spiriva and Symbicort Offered him the flu shot today he declines Discussed smoking cessation rov 6

## 2013-11-17 ENCOUNTER — Other Ambulatory Visit: Payer: Medicare HMO | Admitting: Lab

## 2013-11-17 ENCOUNTER — Ambulatory Visit: Payer: Medicare HMO | Admitting: Hematology & Oncology

## 2013-12-08 ENCOUNTER — Other Ambulatory Visit (HOSPITAL_BASED_OUTPATIENT_CLINIC_OR_DEPARTMENT_OTHER): Payer: Medicare HMO | Admitting: Lab

## 2013-12-08 ENCOUNTER — Ambulatory Visit (HOSPITAL_BASED_OUTPATIENT_CLINIC_OR_DEPARTMENT_OTHER): Payer: Medicare HMO | Admitting: Family

## 2013-12-08 VITALS — BP 135/90 | HR 76 | Temp 97.6°F | Resp 20 | Wt 285.0 lb

## 2013-12-08 DIAGNOSIS — D45 Polycythemia vera: Secondary | ICD-10-CM

## 2013-12-08 DIAGNOSIS — D751 Secondary polycythemia: Secondary | ICD-10-CM

## 2013-12-08 LAB — CBC WITH DIFFERENTIAL (CANCER CENTER ONLY)
BASO#: 0.1 10*3/uL (ref 0.0–0.2)
BASO%: 0.8 % (ref 0.0–2.0)
EOS ABS: 0.2 10*3/uL (ref 0.0–0.5)
EOS%: 3.8 % (ref 0.0–7.0)
HCT: 53.7 % — ABNORMAL HIGH (ref 38.7–49.9)
HEMOGLOBIN: 18.4 g/dL — AB (ref 13.0–17.1)
LYMPH#: 1.5 10*3/uL (ref 0.9–3.3)
LYMPH%: 23 % (ref 14.0–48.0)
MCH: 35 pg — ABNORMAL HIGH (ref 28.0–33.4)
MCHC: 34.3 g/dL (ref 32.0–35.9)
MCV: 102 fL — ABNORMAL HIGH (ref 82–98)
MONO#: 0.6 10*3/uL (ref 0.1–0.9)
MONO%: 9.7 % (ref 0.0–13.0)
NEUT%: 62.7 % (ref 40.0–80.0)
NEUTROS ABS: 4 10*3/uL (ref 1.5–6.5)
Platelets: 124 10*3/uL — ABNORMAL LOW (ref 145–400)
RBC: 5.26 10*6/uL (ref 4.20–5.70)
RDW: 12.6 % (ref 11.1–15.7)
WBC: 6.4 10*3/uL (ref 4.0–10.0)

## 2013-12-08 LAB — CHCC SATELLITE - SMEAR

## 2013-12-08 LAB — IRON AND TIBC CHCC
%SAT: 71 % — ABNORMAL HIGH (ref 20–55)
Iron: 225 ug/dL — ABNORMAL HIGH (ref 42–163)
TIBC: 315 ug/dL (ref 202–409)
UIBC: 90 ug/dL — AB (ref 117–376)

## 2013-12-08 LAB — FERRITIN CHCC: Ferritin: 401 ng/ml — ABNORMAL HIGH (ref 22–316)

## 2013-12-08 LAB — CMP (CANCER CENTER ONLY)
ALK PHOS: 45 U/L (ref 26–84)
ALT: 33 U/L (ref 10–47)
AST: 22 U/L (ref 11–38)
Albumin: 3.9 g/dL (ref 3.3–5.5)
BILIRUBIN TOTAL: 1.2 mg/dL (ref 0.20–1.60)
BUN, Bld: 12 mg/dL (ref 7–22)
CO2: 30 mEq/L (ref 18–33)
CREATININE: 0.8 mg/dL (ref 0.6–1.2)
Calcium: 8.9 mg/dL (ref 8.0–10.3)
Chloride: 98 mEq/L (ref 98–108)
Glucose, Bld: 110 mg/dL (ref 73–118)
Potassium: 3.8 mEq/L (ref 3.3–4.7)
Sodium: 139 mEq/L (ref 128–145)
Total Protein: 7 g/dL (ref 6.4–8.1)

## 2013-12-08 NOTE — Progress Notes (Signed)
Billy [name]  [phone] [phone]) [phone]  ID: Billy [name] OB: 1970-08-13 MR#: [id number]#:[id number] Patient Care Team: Collene Gobble, MD as PCP - General (Pulmonary Disease)  DIAGNOSIS: 1. Secondary polycythemia.  2. Severe chronic obstructive pulmonary disease.  3. Obesity, hypoventilation syndrome.  4. Chronic mild thrombocytopenia.  INTERVAL HISTORY: Billy [name] is here today for a follow-up. He is doing well. He has no complaints. He is using his CPAP nightly. He has cut back on his smoking. He is also losing weight but curbing his diet and not eating at night. He has lost 60 lbs in the last 6 months. He is drinking plenty of fluids. He denies fever, chills, n/v, cough, rash, headache, dizziness, SOB, chest pain, palpitations, abdominal pain, constipation, diarrhea, blood in urine or stool. He has had no swelling, tenderness, numbness or tingling in his extremities. No bleeding or pain.    CURRENT TREATMENT: Phlebotomy when indicated CPAP for obesity-hypoventilation syndrome Aspirin 81 mg p.o. Daily  REVIEW OF SYSTEMS: All other 10 point review of systems is negative.   PAST MEDICAL HISTORY: Past Medical History  Diagnosis Date  . PVD (peripheral vascular disease)   . Tobacco abuse   . Obesity   . COPD (chronic obstructive pulmonary disease)   . History of bronchitis   . Polycythemia     a. Dr. Marin Olp.Marland KitchenMarland KitchenJAK-2 analysis is negative.  b. likely physiologic secondary to chronic hypoxia (? OSA + COPD + obesity).  c. s/p plebotomy x1  . Sleep apnea     a. mod severe by sleep study 9.30.11: AHI 44.8 per hour; oxygen desat to a nadir of 65%; unsuccessful CPAP titration; significant hypoxemia even with supp o2 at 3LPM (dest to 70-80%); needs eval for cardiopulmonary disease and upper airway obstruction   PAST SURGICAL HISTORY: Past Surgical History  Procedure Laterality Date  . Knee injury  1982   FAMILY HISTORY Family History  Problem  Relation Age of Onset  . Other Mother     Wegener's Disease  . Lung cancer Paternal Grandmother    SOCIAL HISTORY: History   Social History  . Marital Status: Divorced    Spouse Name: N/A    Number of Children: N/A  . Years of Education: N/A   Occupational History  . unemployed    Social History Main Topics  . Smoking status: Current Every Day Smoker -- 1.00 packs/day for 25 years    Types: Cigarettes    Start date: 02/08/1986  . Smokeless tobacco: Never Used     Comment: trying to quit, down to 1/2 pack  . Alcohol Use: Yes     Comment: social > previously drinking 12 pack beer a day; cut down to a few beers a day now since in hospital  . Drug Use: Yes    Special: Marijuana     Comment: marajuana...also valium not prescribed  . Sexual Activity: Not on file   Other Topics Concern  . Not on file   Social History Narrative  . No narrative on file   ADVANCED DIRECTIVES:  <no information>  HEALTH MAINTENANCE: History  Substance Use Topics  . Smoking status: Current Every Day Smoker -- 1.00 packs/day for 25 years    Types: Cigarettes    Start date: 02/08/1986  . Smokeless tobacco: Never Used     Comment: trying to quit, down to 1/2 pack  . Alcohol Use: Yes     Comment: social > previously drinking 12 pack beer a day; cut  down to a few beers a day now since in hospital   Colonoscopy: PAP: Bone density: Lipid panel:  No Known Allergies  Current Outpatient Prescriptions  Medication Sig Dispense Refill  . albuterol (PROVENTIL HFA) 108 (90 BASE) MCG/ACT inhaler 1-2 puffs very 4-6 hours as needed  18 g  5  . aspirin 81 MG tablet Take 81 mg by mouth daily.        . budesonide-formoterol (SYMBICORT) 160-4.5 MCG/ACT inhaler Inhale 2 puffs into the lungs 2 (two) times daily.  1 Inhaler  6  . tiotropium (SPIRIVA) 18 MCG inhalation capsule Place 1 capsule (18 mcg total) into inhaler and inhale daily.  30 capsule  6  . [DISCONTINUED] sildenafil (VIAGRA) 50 MG tablet Take 1  tablet (50 mg total) by mouth daily as needed for erectile dysfunction.  10 tablet  0   No current facility-administered medications for this visit.   OBJECTIVE: Filed Vitals:   12/08/13 0900  BP: 135/90  Pulse: 76  Temp: 97.6 F (36.4 C)  Resp: 20    Filed Weights   12/08/13 0900  Weight: 285 lb (129.275 kg)   ECOG FS:0 - Asymptomatic Ocular: Sclerae unicteric, pupils equal, round and reactive to light Ear-nose-throat: Oropharynx clear, dentition fair Lymphatic: No cervical or supraclavicular adenopathy Lungs no rales or rhonchi, good excursion bilaterally Heart regular rate and rhythm, no murmur appreciated Abd soft, nontender, positive bowel sounds MSK no focal spinal tenderness, no joint edema Neuro: non-focal, well-oriented, appropriate affect  LAB RESULTS: CMP     Component Value Date/Time   NA 135 04/28/2011 1444   K 3.8 04/28/2011 1444   CL 98 04/28/2011 1444   CO2 33* 04/28/2011 1444   GLUCOSE 99 04/28/2011 1444   BUN 13 04/28/2011 1444   CREATININE 0.89 04/28/2011 1444   CALCIUM 9.4 04/28/2011 1444   PROT 6.8 04/28/2011 1444   ALBUMIN 4.4 04/28/2011 1444   AST 19 04/28/2011 1444   ALT 26 04/28/2011 1444   ALKPHOS 67 04/28/2011 1444   BILITOT 0.6 04/28/2011 1444   GFRNONAA >60 05/30/2010 1556   GFRAA  Value: >60        The eGFR has been calculated using the MDRD equation. This calculation has not been validated in all clinical situations. eGFR's persistently <60 mL/min signify possible Chronic Kidney Disease. 05/30/2010 1556   No results found for this basename: SPEP, UPEP,  kappa and lambda light chains   Lab Results  Component Value Date   WBC 6.4 12/08/2013   NEUTROABS 4.0 12/08/2013   HGB 18.4* 12/08/2013   HCT 53.7* 12/08/2013   MCV 102* 12/08/2013   PLT 124* 12/08/2013   No results found for this basename: LABCA2   No components found with this basename: LABCA125   No results found for this basename: INR,  in the last 168 hours Urinalysis    Component Value Date/Time    COLORURINE YELLOW 05/30/2010 [place] 05/30/2010 1555   LABSPEC 1.016 05/30/2010 1555   PHURINE 6.0 05/30/2010 [place] 05/30/2010 1555   HGBUR TRACE* 05/30/2010 [place] 05/30/2010 [place] 05/30/2010 1555   PROTEINUR 30* 05/30/2010 1555   UROBILINOGEN 0.2 05/30/2010 1555   NITRITE NEGATIVE 05/30/2010 1555   LEUKOCYTESUR NEGATIVE 05/30/2010 1555   STUDIES: No results found.  ASSESSMENT/PLAN: Mr. Bumbaugh is a 43 year old gentleman with secondary polycythemia. He has obesity and hypoventilation syndrome. Using his CPAP nightly has helped. He is still working  on cutting back his smoking. He has lost some weight which has also helped make a difference. His Hct today is 53.7 MCV 102 platelets 124.  He is asymptomatic at this time. We will not phlebotomize him today.  We will see him back in 6 months for labs and follow-up.  He knows to call here with any questions or concerns and to go to the ED in the event of an emergency. We can certainly see him sooner if need be.   Eliezer Bottom, NP 12/08/2013 9:56 AM

## 2014-01-05 ENCOUNTER — Telehealth: Payer: Self-pay | Admitting: Emergency Medicine

## 2014-01-05 MED ORDER — BUDESONIDE-FORMOTEROL FUMARATE 160-4.5 MCG/ACT IN AERO: 2.0000 | INHALATION_SPRAY | Freq: Two times a day (BID) | RESPIRATORY_TRACT | Status: DC

## 2014-01-05 MED ORDER — ALBUTEROL SULFATE HFA 108 (90 BASE) MCG/ACT IN AERS: INHALATION_SPRAY | RESPIRATORY_TRACT | Status: DC

## 2014-01-05 MED ORDER — TIOTROPIUM BROMIDE MONOHYDRATE 18 MCG IN CAPS: 18.0000 ug | ORAL_CAPSULE | Freq: Every day | RESPIRATORY_TRACT | Status: DC

## 2014-01-05 NOTE — Telephone Encounter (Signed)
Spoke with the pt  I have refilled the spiriva, advair, and rescue inhaler per his request  Nothing further needed

## 2014-04-09 ENCOUNTER — Telehealth: Payer: Self-pay | Admitting: Emergency Medicine

## 2014-04-09 MED ORDER — ALBUTEROL SULFATE HFA 108 (90 BASE) MCG/ACT IN AERS: 2.0000 | INHALATION_SPRAY | Freq: Four times a day (QID) | RESPIRATORY_TRACT | Status: DC | PRN

## 2014-04-09 MED ORDER — TIOTROPIUM BROMIDE MONOHYDRATE 18 MCG IN CAPS: 18.0000 ug | ORAL_CAPSULE | Freq: Every day | RESPIRATORY_TRACT | Status: DC

## 2014-04-09 NOTE — Telephone Encounter (Signed)
Called pt. He needs his proair and spiriva called in. Nothing further needed

## 2014-05-01 ENCOUNTER — Encounter: Payer: Self-pay | Admitting: Emergency Medicine

## 2014-05-01 ENCOUNTER — Ambulatory Visit (INDEPENDENT_AMBULATORY_CARE_PROVIDER_SITE_OTHER): Payer: PPO | Admitting: Emergency Medicine

## 2014-05-01 VITALS — BP 120/76 | HR 88 | Ht 73.0 in | Wt 300.0 lb

## 2014-05-01 DIAGNOSIS — J449 Chronic obstructive pulmonary disease, unspecified: Secondary | ICD-10-CM

## 2014-05-01 NOTE — Assessment & Plan Note (Signed)
Please continue your inhaled medications as you are taking them  Continue to try to work on decreasing your smoking  Wear you CPAP every night Follow with Dr Lamonte Sakai in 6 months or sooner if you have any problems

## 2014-05-01 NOTE — Progress Notes (Signed)
Subjective:    Patient ID: Billy [name], male    DOB: October 01, 1970, 44 y.o.   MRN: [id number] HPI 44 yo male with initial pulmonary consult during hospitalization 05/30/10 for severe OSA, pulmonary HTN, hypoxemia   ROV 05/12/11 -- OSA/OHS, severe COPD with associated hypoxemia and polycythemia. Still smokes 1 pk/day. Has been reliable with CPAP, less reliable with his BD's due to expense of his meds. Has been using it sparingly. Not wearing o2 here today - the O2 has been irritating his nose.   ROV 10/13/11 -- OSA/OHS, severe COPD with associated hypoxemia and polycythemia. Still smokes 1 pk/day. Not using his BD's - doesn;t have his BD's, can't afford them, needs to fill out his financial assistance paperwork. He ran out of symbicort 3 weeks ago. He doesn't take Spiriva every day, uses it about 2x a week. Wear his his CPAP reliably. He won't have insurance until next April.   ROV 01/11/12 -- OSA/OHS with good CPAP compliance, severe COPD with associated hypoxemia and polycythemia. Still smokes ~1 pk/day. Here for regular follow up. He is using O2 prn, was not desaturated on exam today. He uses Symbicort and Spiriva reliably now. Uses albuterol prn >> averages few times a week    Acute OV 06/07/12 --  Complains of prod cough with clear/light yellow mucus, wheezing, increased SOB x1 week - denies f/c/s, tightness.  has been out of spiriva x1 week and reports does not take regularly/  Still smoking 1/2-1 PPD . Discussed smoking cessation  Not taking Spriva on reg basis.  On disability now has insurance to cover rx. Will need to send refills to pharm.  No hemoptysis , chest pain or orthopnea, edema , calf pain , fever.   ROV 06/25/12 -- OSA/OHS with good CPAP compliance, severe COPD with associated hypoxemia and polycythemia. Still smokes ~1 pk/day. Was treated 4/11 with pred + doxy for an AE.  He was off Spiriva for a while due to cost; he has assistance getting the Symbicort. No phlebotomy for over a  year. He is having some nasal allergies   ROV 11/07/12 -- OSA/OHS with good CPAP compliance, severe COPD with associated hypoxemia and polycythemia. Still smokes ~1 pk/day. He doesn't wear O2 with all exertion. His cough is a little worse, has a tickle in his chest. EtOH use is significant.   ROV 03/05/13 -- OSA/OHS with good CPAP compliance, severe COPD with associated hypoxemia and polycythemia. Medication compliance and affordability are issues - unable to get his meds, hasn't been able to get Spiriva so has never taken more than 10 days straight. Using albuterol a few times a week. He has had enough Symbicort to stay reliable with it. He feels that his breathing is pretty good, does have exertional limitations. Hasn't had to have phlebotomy in over a year.   ROV 10/28/13 -- follows for OSA/OHS with good CPAP compliance, severe COPD with associated hypoxemia and polycythemia. He returns today for regular followup.. Continues to smoke - less than before. He is having trouble with his CPAP > seems to be contributing to some loosening of his lower teeth. He has lost some weight > 23 lbs. His breathing has benefited. He is taking his Spiriva and Symbicort reliably. Not using O2.   His Mom has Wegener's   ROV 05/01/14 -- follow up visit for hypoxemia, severe COPD and OSA/OHS. Still smoking. He has gained back 14 lbs. He tried the nasal mask but his mouth opened too much. His exertional tolerance is  ok when he paces himself.  He has not had any flares, abx, pred, hospitalizations.    Objective:   Physical Exam  Filed Vitals:   05/01/14 1459 05/01/14 1500  BP:  120/76  Pulse:  88  Height: 6\' 1"  (1.854 m)   Weight: 300 lb (136.079 kg)   SpO2:  94%   Gen: Pleasant, obese, in no distress,  normal affect  ENT: No lesions,  mouth clear,  oropharynx clear, no postnasal drip  Neck: No JVD, no TMG, no carotid bruits  Lungs: clear B, no wheezes  Cardiovascular: RRR, heart sounds normal, no murmur or  gallops, no peripheral edema  Musculoskeletal: No deformities, no cyanosis or clubbing  Neuro: alert, non focal  Skin: some chronic changes B LE   Assessment & Plan:  COPD (chronic obstructive pulmonary disease) Please continue your inhaled medications as you are taking them  Continue to try to work on decreasing your smoking  Wear you CPAP every night Follow with Dr Lamonte Sakai in 6 months or sooner if you have any problems

## 2014-05-01 NOTE — Patient Instructions (Addendum)
Please continue your inhaled medications as you are taking them  Continue to try to work on decreasing your smoking  Wear you CPAP every night Follow with Dr Lamonte Sakai in 6 months or sooner if you have any problems

## 2014-06-08 ENCOUNTER — Encounter: Payer: Self-pay | Admitting: Family

## 2014-06-08 ENCOUNTER — Ambulatory Visit (HOSPITAL_BASED_OUTPATIENT_CLINIC_OR_DEPARTMENT_OTHER): Payer: PPO | Admitting: Family

## 2014-06-08 ENCOUNTER — Telehealth: Payer: Self-pay | Admitting: Hematology & Oncology

## 2014-06-08 ENCOUNTER — Other Ambulatory Visit (HOSPITAL_BASED_OUTPATIENT_CLINIC_OR_DEPARTMENT_OTHER): Payer: PPO

## 2014-06-08 VITALS — BP 128/81 | HR 90 | Temp 98.1°F | Resp 18 | Ht 71.0 in | Wt 303.0 lb

## 2014-06-08 DIAGNOSIS — D45 Polycythemia vera: Secondary | ICD-10-CM

## 2014-06-08 DIAGNOSIS — E6609 Other obesity due to excess calories: Secondary | ICD-10-CM

## 2014-06-08 DIAGNOSIS — D696 Thrombocytopenia, unspecified: Secondary | ICD-10-CM

## 2014-06-08 DIAGNOSIS — R0689 Other abnormalities of breathing: Secondary | ICD-10-CM

## 2014-06-08 DIAGNOSIS — D751 Secondary polycythemia: Secondary | ICD-10-CM | POA: Diagnosis not present

## 2014-06-08 LAB — CBC WITH DIFFERENTIAL (CANCER CENTER ONLY)
BASO#: 0.1 10*3/uL (ref 0.0–0.2)
BASO%: 0.6 % (ref 0.0–2.0)
EOS ABS: 0.1 10*3/uL (ref 0.0–0.5)
EOS%: 1.6 % (ref 0.0–7.0)
HEMATOCRIT: 49.4 % (ref 38.7–49.9)
HGB: 16.9 g/dL (ref 13.0–17.1)
LYMPH#: 1.7 10*3/uL (ref 0.9–3.3)
LYMPH%: 19.5 % (ref 14.0–48.0)
MCH: 34.8 pg — ABNORMAL HIGH (ref 28.0–33.4)
MCHC: 34.2 g/dL (ref 32.0–35.9)
MCV: 102 fL — AB (ref 82–98)
MONO#: 0.6 10*3/uL (ref 0.1–0.9)
MONO%: 7 % (ref 0.0–13.0)
NEUT#: 6.2 10*3/uL (ref 1.5–6.5)
NEUT%: 71.3 % (ref 40.0–80.0)
Platelets: 134 10*3/uL — ABNORMAL LOW (ref 145–400)
RBC: 4.85 10*6/uL (ref 4.20–5.70)
RDW: 12.5 % (ref 11.1–15.7)
WBC: 8.6 10*3/uL (ref 4.0–10.0)

## 2014-06-08 LAB — COMPREHENSIVE METABOLIC PANEL
ALT: 28 U/L (ref 0–53)
AST: 20 U/L (ref 0–37)
Albumin: 4.1 g/dL (ref 3.5–5.2)
Alkaline Phosphatase: 56 U/L (ref 39–117)
BILIRUBIN TOTAL: 0.5 mg/dL (ref 0.2–1.2)
BUN: 12 mg/dL (ref 6–23)
CO2: 28 mEq/L (ref 19–32)
CREATININE: 0.88 mg/dL (ref 0.50–1.35)
Calcium: 9.2 mg/dL (ref 8.4–10.5)
Chloride: 102 mEq/L (ref 96–112)
Glucose, Bld: 92 mg/dL (ref 70–99)
Potassium: 4.2 mEq/L (ref 3.5–5.3)
SODIUM: 138 meq/L (ref 135–145)
Total Protein: 6.4 g/dL (ref 6.0–8.3)

## 2014-06-08 LAB — CHCC SATELLITE - SMEAR

## 2014-06-08 NOTE — Progress Notes (Signed)
Hematology and Oncology Follow Up Visit  ROMYN BOSWELL [id number] [birth year] 44 y.o. 06/08/2014   Principle Diagnosis:  1. Secondary polycythemia.  2. Severe chronic obstructive pulmonary disease.  3. Obesity, hypoventilation syndrome.  4. Chronic mild thrombocytopenia.  Current Therapy:   Phlebotomy when indicated CPAP for obesity-hypoventilation syndrome Aspirin 81 mg p.o. Daily    Interim History: Mr. Philipson is here today for a follow-up. He is feeling a little better and has no complaints today.  He is under a lot of stress at home. Unfortunately, he is going through a custody battle over his children with his ex-wife. He continues his CPAP nightly. He is still smoking some.  His appetite is good and he is making sure to stay hydrated. His weight is up. He is trying to get out and walk to lose weight.  No problem with infections. He denies fever, chills, n/v, cough, rash, headache, dizziness, chest pain, palpitations, abdominal pain, constipation, diarrhea, blood in urine or stool. He has some SOB with exertion due to COPD.  He has had no swelling, tenderness, numbness or tingling in his extremities. No new aches or pains.   Medications:    Medication List       This list is accurate as of: 06/08/14  3:32 PM.  Always use your most recent med list.               albuterol 108 (90 BASE) MCG/ACT inhaler  Commonly known as:  PROAIR HFA  Inhale 2 puffs into the lungs every 6 (six) hours as needed for wheezing or shortness of breath.     aspirin 81 MG tablet  Take 81 mg by mouth daily.     budesonide-formoterol 160-4.5 MCG/ACT inhaler  Commonly known as:  SYMBICORT  Inhale 2 puffs into the lungs 2 (two) times daily.     tiotropium 18 MCG inhalation capsule  Commonly known as:  SPIRIVA  Place 1 capsule (18 mcg total) into inhaler and inhale daily.        Allergies: No Known Allergies  Past Medical History, Surgical history, Social history, and Family History  were reviewed and updated.  Review of Systems: All other 10 point review of systems is negative.   Physical Exam:  height is 5\' 11"  (1.803 m) and weight is 303 lb (137.44 kg). His oral temperature is 98.1 F (36.7 C). His blood pressure is 128/81 and his pulse is 90. His respiration is 18.   Wt Readings from Last 3 Encounters:  06/08/14 303 lb (137.44 kg)  05/01/14 300 lb (136.079 kg)  12/08/13 285 lb (129.275 kg)    Ocular: Sclerae unicteric, pupils equal, round and reactive to light Ear-nose-throat: Oropharynx clear, dentition fair Lymphatic: No cervical or supraclavicular adenopathy Lungs no rales or rhonchi, good excursion bilaterally Heart regular rate and rhythm, no murmur appreciated Abd soft, nontender, positive bowel sounds MSK no focal spinal tenderness, no joint edema Neuro: non-focal, well-oriented, appropriate affect  Lab Results  Component Value Date   WBC 8.6 06/08/2014   HGB 16.9 06/08/2014   HCT 49.4 06/08/2014   MCV 102* 06/08/2014   PLT 134* 06/08/2014   Lab Results  Component Value Date   FERRITIN 401* 12/08/2013   IRON 225* 12/08/2013   TIBC 315 12/08/2013   UIBC 90* 12/08/2013   IRONPCTSAT 71* 12/08/2013   Lab Results  Component Value Date   RETICCTPCT 1.1 08/03/2011   RBC 4.85 06/08/2014   RETICCTABS 55.9 08/03/2011   No results found for:  KPAFRELGTCHN, LAMBDASER, KAPLAMBRATIO No results found for: Kandis Cocking, IGMSERUM No results found for: Odetta Pink, SPEI   Chemistry      Component Value Date/Time   NA 139 12/08/2013 0840   NA 135 04/28/2011 1444   K 3.8 12/08/2013 0840   K 3.8 04/28/2011 1444   CL 98 12/08/2013 0840   CL 98 04/28/2011 1444   CO2 30 12/08/2013 0840   CO2 33* 04/28/2011 1444   BUN 12 12/08/2013 0840   BUN 13 04/28/2011 1444   CREATININE 0.8 12/08/2013 0840   CREATININE 0.89 04/28/2011 1444      Component Value Date/Time   CALCIUM 8.9 12/08/2013 0840    CALCIUM 9.4 04/28/2011 1444   ALKPHOS 45 12/08/2013 0840   ALKPHOS 67 04/28/2011 1444   AST 22 12/08/2013 0840   AST 19 04/28/2011 1444   ALT 33 12/08/2013 0840   ALT 26 04/28/2011 1444   BILITOT 1.20 12/08/2013 0840   BILITOT 0.6 04/28/2011 1444     Impression and Plan: Mr. Baruch is a 44 year old gentleman with secondary polycythemia. He has obesity and hypoventilation syndrome. He is asymptomatic at this time. His Hct is down to 49.4 and platelets 134. We will see what his iron studies show.  We will not phlebotomize him today.  We will see him back in 6 months for labs and follow-up.  He knows to call here with any questions or concerns and to go to the ED in the event of an emergency. We can certainly see him sooner if need be.   Eliezer Bottom, NP 4/11/20163:32 PM

## 2014-06-08 NOTE — Telephone Encounter (Signed)
Mailed oct schedule °

## 2014-06-09 LAB — FERRITIN CHCC: Ferritin: 373 ng/ml — ABNORMAL HIGH (ref 22–316)

## 2014-06-09 LAB — IRON AND TIBC CHCC
%SAT: 37 % (ref 20–55)
IRON: 121 ug/dL (ref 42–163)
TIBC: 328 ug/dL (ref 202–409)
UIBC: 207 ug/dL (ref 117–376)

## 2014-06-29 ENCOUNTER — Ambulatory Visit (INDEPENDENT_AMBULATORY_CARE_PROVIDER_SITE_OTHER): Payer: PPO | Admitting: Internal Medicine

## 2014-06-29 ENCOUNTER — Ambulatory Visit (INDEPENDENT_AMBULATORY_CARE_PROVIDER_SITE_OTHER)
Admission: RE | Admit: 2014-06-29 | Discharge: 2014-06-29 | Disposition: A | Discharge status: Home / Self Care | Payer: PPO | Source: Ambulatory Visit | Attending: Internal Medicine | Admitting: Internal Medicine

## 2014-06-29 ENCOUNTER — Encounter: Payer: Self-pay | Admitting: Internal Medicine

## 2014-06-29 VITALS — BP 128/78 | HR 128 | Ht 71.0 in | Wt 293.0 lb

## 2014-06-29 DIAGNOSIS — J441 Chronic obstructive pulmonary disease with (acute) exacerbation: Secondary | ICD-10-CM | POA: Diagnosis not present

## 2014-06-29 MED ORDER — LEVALBUTEROL HCL 0.63 MG/3ML IN NEBU
0.6300 mg | INHALATION_SOLUTION | Freq: Once | RESPIRATORY_TRACT | Status: AC
  Administered 2014-06-29: 0.63 mg via RESPIRATORY_TRACT

## 2014-06-29 MED ORDER — LEVOFLOXACIN 500 MG PO TABS: 500.0000 mg | ORAL_TABLET | Freq: Every day | ORAL | Status: DC

## 2014-06-29 MED ORDER — PREDNISONE 10 MG PO TABS: ORAL_TABLET | ORAL | Status: DC

## 2014-06-29 MED ORDER — METHYLPREDNISOLONE ACETATE 80 MG/ML IJ SUSP
80.0000 mg | Freq: Once | INTRAMUSCULAR | Status: AC
  Administered 2014-06-29: 80 mg via INTRAMUSCULAR

## 2014-06-29 NOTE — Progress Notes (Signed)
Subjective:    Patient ID: Billy [name], male    DOB: 1971-01-31, 44 y.o.   MRN: [id number]  HPI  OV 06/29/2014  Chief Complaint  Patient presents with  . Acute Visit    Rb pt. Pt c/o increase in SOB, prod cough with green mucus, chest congestion, neck stiffness x 2 days.     44 year old obese male with sleep apnea and reported history of severe COPD and continued smoking. According to his history last chest x-ray and pulmonary function test was several years ago. He comes in acutely because several days ago over the weekend he was exposed to several sick kids at a family, gout. Since then he's had worsening cough with change in color of sputum to green and increased sputum volume associated with worsening shortness of breath. Symptoms are described as moderate to severe in intensity. 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 He does not feel that he is sick enough that he needs admission. But he feels is a classic COPD exacerbation for him. There is no fever or chills or hemoptysis    has a past medical history of PVD (peripheral vascular disease); Tobacco abuse; Obesity; COPD (chronic obstructive pulmonary disease); History of bronchitis; Polycythemia; and Sleep apnea.   reports that he has been smoking Cigarettes.  He started smoking about 28 years ago. He has a 6.25 pack-year smoking history. He has never used smokeless tobacco.  Past Surgical History  Procedure Laterality Date  . Knee injury  1982    No Known Allergies  There is no immunization history for the selected administration types on file for this patient.  Family History  Problem Relation Age of Onset  . Other Mother     Wegener's Disease  . Lung cancer Paternal Grandmother      Current outpatient prescriptions:  .  albuterol (PROAIR HFA) 108 (90 BASE) MCG/ACT inhaler, Inhale 2 puffs into the lungs every 6 (six) hours as needed for wheezing or shortness of breath., Disp: 1 Inhaler, Rfl: 3 .   aspirin 81 MG tablet, Take 81 mg by mouth daily.  , Disp: , Rfl:  .  budesonide-formoterol (SYMBICORT) 160-4.5 MCG/ACT inhaler, Inhale 2 puffs into the lungs 2 (two) times daily., Disp: 1 Inhaler, Rfl: 6 .  tiotropium (SPIRIVA) 18 MCG inhalation capsule, Place 1 capsule (18 mcg total) into inhaler and inhale daily., Disp: 30 capsule, Rfl: 6 .  [DISCONTINUED] sildenafil (VIAGRA) 50 MG tablet, Take 1 tablet (50 mg total) by mouth daily as needed for erectile dysfunction., Disp: 10 tablet, Rfl: 0    Review of Systems  Constitutional: Negative for fever and unexpected weight change.  HENT: Positive for congestion. Negative for dental problem, ear pain, nosebleeds, postnasal drip, rhinorrhea, sinus pressure, sneezing, sore throat and trouble swallowing.   Eyes: Negative for redness and itching.  Respiratory: Positive for cough and shortness of breath. Negative for chest tightness and wheezing.   Cardiovascular: Negative for palpitations and leg swelling.  Gastrointestinal: Negative for nausea and vomiting.  Genitourinary: Negative for dysuria.  Musculoskeletal: Negative for joint swelling.  Skin: Negative for rash.  Neurological: Negative for headaches.  Hematological: Does not bruise/bleed easily.  Psychiatric/Behavioral: Negative for dysphoric mood. The patient is not nervous/anxious.        Objective:   Physical Exam  Constitutional: He is oriented to person, place, and time. He appears well-developed and well-nourished. No distress.  Obese   HENT:  Head: Normocephalic and atraumatic.  Right Ear:  External ear normal.  Left Ear: External ear normal.  Mouth/Throat: Oropharynx is clear and moist. No oropharyngeal exudate.  Eyes: Conjunctivae and EOM are normal. Pupils are equal, round, and reactive to light. Right eye exhibits no discharge. Left eye exhibits no discharge. No scleral icterus.  Neck: Normal range of motion. Neck supple. No JVD present. No tracheal deviation present. No  thyromegaly present.  Cardiovascular: Normal rate, regular rhythm and intact distal pulses.  Exam reveals no gallop and no friction rub.   No murmur heard. Pulmonary/Chest: Effort normal and breath sounds normal. No respiratory distress. He has no wheezes. He has no rales. He exhibits no tenderness.  Sitting leaning forward Coughs +  Abdominal: Soft. Bowel sounds are normal. He exhibits no distension and no mass. There is no tenderness. There is no rebound and no guarding.  Musculoskeletal: Normal range of motion. He exhibits no edema or tenderness.  Lymphadenopathy:    He has no cervical adenopathy.  Neurological: He is alert and oriented to person, place, and time. He has normal reflexes. No cranial nerve deficit. Coordination normal.  Skin: Skin is warm and dry. No rash noted. He is not diaphoretic. No erythema. No pallor.  [place]ato[place]  Psychiatric: He has a normal mood and affect. His behavior is normal. Judgment and thought content normal.  Nursing note and vitals reviewed.   Filed Vitals:   06/29/14 1542  BP: 128/78  Pulse: 128  Height: 5\' 11"  (1.803 m)  Weight: 293 lb (132.904 kg)  SpO2: 90%         Assessment & Plan:     ICD-9-CM ICD-10-CM   1. COPD exacerbation 491.21 J44.1     Albuterol neb treatment in office right now Depo-Medrol 80 mg IM 1 right now in office Chest x-ray 2 view today; will call with results  START levaquin 500mg  once daily  X 5 days START prednisone 40 mg daily x 2 days, then 20mg  daily x 2 days, then 10mg  daily x 2 days, then 5mg  daily x 2 days and stop schedule Spiriva and Symbicort  Continue albuterol as needed,   Follow-up - if worse go to ER  - Full pulmonary function test between 4 and 8 weeks from now - Return to see Dr. Lamonte Sakai or Patricia Nettle between 4 and 8 weeks from now after pulmonary function test   Dr. Brand Males, M.D., [facility].C.P Pulmonary and Critical Care Medicine Staff Physician [facility] Pager: [phone], If no answer or between  15:00h - 7:00h: call [phone]  06/29/2014 4:30 PM

## 2014-06-29 NOTE — Addendum Note (Signed)
Addended by: Maurice March on: 06/29/2014 04:39 PM   Modules accepted: Orders

## 2014-06-29 NOTE — Addendum Note (Signed)
Addended by: Maurice March on: 06/29/2014 05:08 PM   Modules accepted: Orders

## 2014-06-29 NOTE — Patient Instructions (Addendum)
ICD-9-CM ICD-10-CM   1. COPD exacerbation 491.21 J44.1     Albuterol neb treatment in office right now Depo-Medrol 80 mg IM 1 right now in office Chest x-ray 2 view today; will call with results  START levaquin 500mg  once daily  X 5 days START prednisone 40 mg daily x 2 days, then 20mg  daily x 2 days, then 10mg  daily x 2 days, then 5mg  daily x 2 days and stop schedule Spiriva and Symbicort  Continue albuterol as needed,   Follow-up 0- go to ER if worse  - Full pulmonary function test between 4 and 8 weeks from now - Return to see Dr. Lamonte Sakai or Patricia Nettle between 4 and 8 weeks from now after pulmonary function test

## 2014-07-03 ENCOUNTER — Telehealth: Payer: Self-pay | Admitting: Internal Medicine

## 2014-07-03 NOTE — Telephone Encounter (Signed)
Notes Recorded by Brand Males, MD on 07/01/2014 at 4:01 PM cxr shows copd ----- Pt is aware of results. Nothing further was needed.

## 2014-08-21 ENCOUNTER — Ambulatory Visit: Payer: PPO | Admitting: Adult Health

## 2014-11-04 ENCOUNTER — Ambulatory Visit: Payer: PPO | Admitting: Adult Health

## 2014-11-18 ENCOUNTER — Encounter: Payer: Self-pay | Admitting: Emergency Medicine

## 2014-11-18 ENCOUNTER — Ambulatory Visit (INDEPENDENT_AMBULATORY_CARE_PROVIDER_SITE_OTHER): Payer: PPO | Admitting: Emergency Medicine

## 2014-11-18 VITALS — BP 116/70 | HR 102 | Temp 98.6°F | Ht 73.0 in | Wt 300.0 lb

## 2014-11-18 DIAGNOSIS — J449 Chronic obstructive pulmonary disease, unspecified: Secondary | ICD-10-CM

## 2014-11-18 DIAGNOSIS — G4733 Obstructive sleep apnea (adult) (pediatric): Secondary | ICD-10-CM

## 2014-11-18 DIAGNOSIS — F17200 Nicotine dependence, unspecified, uncomplicated: Secondary | ICD-10-CM

## 2014-11-18 DIAGNOSIS — Z720 Tobacco use: Secondary | ICD-10-CM

## 2014-11-18 MED ORDER — TIOTROPIUM BROMIDE MONOHYDRATE 18 MCG IN CAPS: 18.0000 ug | ORAL_CAPSULE | Freq: Every day | RESPIRATORY_TRACT | Status: DC

## 2014-11-18 MED ORDER — ALBUTEROL SULFATE HFA 108 (90 BASE) MCG/ACT IN AERS: 2.0000 | INHALATION_SPRAY | Freq: Four times a day (QID) | RESPIRATORY_TRACT | Status: DC | PRN

## 2014-11-18 MED ORDER — BUDESONIDE-FORMOTEROL FUMARATE 160-4.5 MCG/ACT IN AERO: 2.0000 | INHALATION_SPRAY | Freq: Two times a day (BID) | RESPIRATORY_TRACT | Status: DC

## 2014-11-18 NOTE — Progress Notes (Signed)
Subjective:    Patient ID: Billy [name], male    DOB: 07/08/1970, 44y.o.   MRN: [id number] HPI 44yo male with initial pulmonary consult during hospitalization 05/30/10 for severe OSA, pulmonary HTN, hypoxemia   ROV 05/12/11 -- OSA/OHS, severe COPD with associated hypoxemia and polycythemia. Still smokes 1 pk/day. Has been reliable with CPAP, less reliable with his BD's due to expense of his meds. Has been using it sparingly. Not wearing o2 here today - the O2 has been irritating his nose.   ROV 10/13/11 -- OSA/OHS, severe COPD with associated hypoxemia and polycythemia. Still smokes 1 pk/day. Not using his BD's - doesn;t have his BD's, can't afford them, needs to fill out his financial assistance paperwork. He ran out of symbicort 3 weeks ago. He doesn't take Spiriva every day, uses it about 2x a week. Wear his his CPAP reliably. He won't have insurance until next April.   ROV 01/11/12 -- OSA/OHS with good CPAP compliance, severe COPD with associated hypoxemia and polycythemia. Still smokes ~1 pk/day. Here for regular follow up. He is using O2 prn, was not desaturated on exam today. He uses Symbicort and Spiriva reliably now. Uses albuterol prn >> averages few times a week    Acute OV 06/07/12 --  Complains of prod cough with clear/light yellow mucus, wheezing, increased SOB x1 week - denies f/c/s, tightness.  has been out of spiriva x1 week and reports does not take regularly/  Still smoking 1/2-1 PPD . Discussed smoking cessation  Not taking Spriva on reg basis.  On disability now has insurance to cover rx. Will need to send refills to pharm.  No hemoptysis , chest pain or orthopnea, edema , calf pain , fever.   ROV 06/25/12 -- OSA/OHS with good CPAP compliance, severe COPD with associated hypoxemia and polycythemia. Still smokes ~1 pk/day. Was treated 4/11 with pred + doxy for an AE.  He was off Spiriva for a while due to cost; he has assistance getting the Symbicort. No phlebotomy for over a  year. He is having some nasal allergies   ROV 11/07/12 -- OSA/OHS with good CPAP compliance, severe COPD with associated hypoxemia and polycythemia. Still smokes ~1 pk/day. He doesn't wear O2 with all exertion. His cough is a little worse, has a tickle in his chest. EtOH use is significant.   ROV 03/05/13 -- OSA/OHS with good CPAP compliance, severe COPD with associated hypoxemia and polycythemia. Medication compliance and affordability are issues - unable to get his meds, hasn't been able to get Spiriva so has never taken more than 10 days straight. Using albuterol a few times a week. He has had enough Symbicort to stay reliable with it. He feels that his breathing is pretty good, does have exertional limitations. Hasn't had to have phlebotomy in over a year.   ROV 10/28/13 -- follows for OSA/OHS with good CPAP compliance, severe COPD with associated hypoxemia and polycythemia. He returns today for regular followup.. Continues to smoke - less than before. He is having trouble with his CPAP > seems to be contributing to some loosening of his lower teeth. He has lost some weight > 23 lbs. His breathing has benefited. He is taking his Spiriva and Symbicort reliably. Not using O2.   His Mom has Wegener's   ROV 05/01/14 -- follow up visit for hypoxemia, severe COPD and OSA/OHS. Still smoking. He has gained back 14 lbs. He tried the nasal mask but his mouth opened too much. His exertional tolerance is ok when  he paces himself.  He has not had any flares, abx, pred, hospitalizations.   ROV 11/18/14 -- follow-up visit for tobacco use, severe COPD, OSA/OHS and chronic hypoxemic respiratory failure. He was seen in May for an acute exacerbation of his COPD treated with cortisone steroids and antibiotics. He has not yet had PFT.  He has been able to start using his CPAP every night, using a full face mask. He can tell that he is benefiting, feels better during the day. He remains on Spiriva and Symbicort, has been  reliable. Uses albuterol about 1-2x a day. He is smoking about 1/2 pk a day. He hasn't had PFT yet. Has O2 bled into CPAP, does not need during the day.    Objective:   Physical Exam  Filed Vitals:   11/18/14 1633  BP: 116/70  Pulse: 102  Temp: 98.6 F (37 C)  TempSrc: Oral  Height: 6\' 1"  (1.854 m)  Weight: 300 lb (136.079 kg)  SpO2: 92%   Gen: Pleasant, obese, in no distress,  normal affect  ENT: No lesions,  mouth clear,  oropharynx clear, no postnasal drip  Neck: No JVD, no TMG, no carotid bruits  Lungs: clear B, no wheezes  Cardiovascular: RRR, heart sounds normal, no murmur or gallops, trace LE edema  Musculoskeletal: No deformities, no cyanosis or clubbing  Neuro: alert, non focal  Skin: some chronic venous stasis changes B LE   Assessment & Plan:  TOBACCO ABUSE I discussed cessation with him in detail today. Discussed nicotine replacement, plans for cutting down and goal setting.  COPD (chronic obstructive pulmonary disease) Severe disease compounded by restriction from obesity. He did not get his pulmonary function testing but is willing to do so in the future after he gets his teeth fixed. For now we will continue his Spiriva, Symbicort, albuterol when necessary. He no longer needs oxygen during the day  Obstructive sleep apnea He is now compliant with CPAP using a full face mask.

## 2014-11-18 NOTE — Assessment & Plan Note (Signed)
Severe disease compounded by restriction from obesity. He did not get his pulmonary function testing but is willing to do so in the future after he gets his teeth fixed. For now we will continue his Spiriva, Symbicort, albuterol when necessary. He no longer needs oxygen during the day

## 2014-11-18 NOTE — Patient Instructions (Signed)
Try to set a goal maximum number of cigarettes that he'll smoke in the day, for example 10 per day. Once you set your goal do not go over this. Ration your cigarettes (take them out of the pack) so you know how many you have left you can smoke during the day. Once you are able to maintain such a goal, then work to decrease the maximum number per day.  Continue Spiriva, Symbicort, albuterol as you're using them Continue your CPAP every night Get the flu shot this fall We will discuss the timing of repeat pulmonary function testing at your next visit Follow with Dr Lamonte Sakai in 4 months or sooner if you have any problems.

## 2014-11-18 NOTE — Assessment & Plan Note (Signed)
He is now compliant with CPAP using a full face mask.

## 2014-11-18 NOTE — Assessment & Plan Note (Signed)
I discussed cessation with him in detail today. Discussed nicotine replacement, plans for cutting down and goal setting.

## 2014-12-07 ENCOUNTER — Telehealth: Payer: Self-pay | Admitting: Hematology & Oncology

## 2014-12-07 ENCOUNTER — Ambulatory Visit: Payer: PPO | Admitting: Hematology & Oncology

## 2014-12-07 ENCOUNTER — Other Ambulatory Visit: Payer: PPO

## 2014-12-07 NOTE — Telephone Encounter (Signed)
Patient called and cx 12/07/14 apt and resch for 12/16/14

## 2014-12-16 ENCOUNTER — Other Ambulatory Visit (HOSPITAL_BASED_OUTPATIENT_CLINIC_OR_DEPARTMENT_OTHER): Payer: PPO

## 2014-12-16 ENCOUNTER — Encounter: Payer: Self-pay | Admitting: Hematology & Oncology

## 2014-12-16 ENCOUNTER — Ambulatory Visit (HOSPITAL_BASED_OUTPATIENT_CLINIC_OR_DEPARTMENT_OTHER): Payer: PPO | Admitting: Hematology & Oncology

## 2014-12-16 VITALS — BP 124/86 | HR 90 | Temp 97.4°F | Resp 14 | Ht 73.0 in | Wt 304.0 lb

## 2014-12-16 DIAGNOSIS — D45 Polycythemia vera: Secondary | ICD-10-CM

## 2014-12-16 DIAGNOSIS — F458 Other somatoform disorders: Secondary | ICD-10-CM | POA: Diagnosis not present

## 2014-12-16 DIAGNOSIS — Z720 Tobacco use: Secondary | ICD-10-CM | POA: Diagnosis not present

## 2014-12-16 DIAGNOSIS — D751 Secondary polycythemia: Secondary | ICD-10-CM

## 2014-12-16 DIAGNOSIS — D696 Thrombocytopenia, unspecified: Secondary | ICD-10-CM

## 2014-12-16 DIAGNOSIS — E669 Obesity, unspecified: Secondary | ICD-10-CM

## 2014-12-16 LAB — COMPREHENSIVE METABOLIC PANEL (CC13)
ALBUMIN: 4.1 g/dL (ref 3.5–5.0)
ALT: 28 U/L (ref 0–55)
AST: 21 U/L (ref 5–34)
Alkaline Phosphatase: 65 U/L (ref 40–150)
Anion Gap: 7 mEq/L (ref 3–11)
BUN: 13.4 mg/dL (ref 7.0–26.0)
CO2: 28 mEq/L (ref 22–29)
Calcium: 9.5 mg/dL (ref 8.4–10.4)
Chloride: 104 mEq/L (ref 98–109)
Creatinine: 0.9 mg/dL (ref 0.7–1.3)
GLUCOSE: 93 mg/dL (ref 70–140)
POTASSIUM: 4.4 meq/L (ref 3.5–5.1)
SODIUM: 139 meq/L (ref 136–145)
Total Bilirubin: 0.66 mg/dL (ref 0.20–1.20)
Total Protein: 7.1 g/dL (ref 6.4–8.3)

## 2014-12-16 LAB — CBC WITH DIFFERENTIAL (CANCER CENTER ONLY)
BASO#: 0.1 10*3/uL (ref 0.0–0.2)
BASO%: 0.7 % (ref 0.0–2.0)
EOS%: 2.5 % (ref 0.0–7.0)
Eosinophils Absolute: 0.2 10*3/uL (ref 0.0–0.5)
HCT: 52.5 % — ABNORMAL HIGH (ref 38.7–49.9)
HGB: 17.9 g/dL — ABNORMAL HIGH (ref 13.0–17.1)
LYMPH#: 2 10*3/uL (ref 0.9–3.3)
LYMPH%: 22.4 % (ref 14.0–48.0)
MCH: 34.4 pg — ABNORMAL HIGH (ref 28.0–33.4)
MCHC: 34.1 g/dL (ref 32.0–35.9)
MCV: 101 fL — ABNORMAL HIGH (ref 82–98)
MONO#: 0.8 10*3/uL (ref 0.1–0.9)
MONO%: 8.5 % (ref 0.0–13.0)
NEUT#: 5.9 10*3/uL (ref 1.5–6.5)
NEUT%: 65.9 % (ref 40.0–80.0)
PLATELETS: 149 10*3/uL (ref 145–400)
RBC: 5.2 10*6/uL (ref 4.20–5.70)
RDW: 12.1 % (ref 11.1–15.7)
WBC: 8.9 10*3/uL (ref 4.0–10.0)

## 2014-12-16 LAB — CHCC SATELLITE - SMEAR

## 2014-12-16 NOTE — Progress Notes (Signed)
Hematology and Oncology Follow Up Visit  Billy [name] [id number] March 14, 1970 44 y.o. 12/16/2014   Principle Diagnosis:  . Secondary polycythemia. 2. Severe chronic obstructive pulmonary disease. 3. Obesity, hypoventilation syndrome. 4. Chronic mild thrombocytopenia.  Current Therapy:   . CPAP for obesity-hypoventilation syndrome. 2. Aspirin 81 mg p.o. daily.     Interim History:  Mr.  [name] is back for followup. We see him every 6 months. He's been doing okay although he is smoking a little bit more. He also is drinking quite a bit. He says he enjoys beer.  He is using his CPAP. He's been diligent with this.  He wants something to try to stop smoking. He sees his pulmonologist soon. He will ask him for Chantix  We last saw him back in April, his ferritin was 737 with an iron saturation of 37%.  He's had no fever. There's been no obvious bleeding.  He's had no change in bowel or bladder habits.  His leg swelling seems to be much better.  He's had no rashes.  Medications:  Current outpatient prescriptions:  .  albuterol (PROAIR HFA) 108 (90 BASE) MCG/ACT inhaler, Inhale 2 puffs into the lungs every 6 (six) hours as needed for wheezing or shortness of breath., Disp: 1 Inhaler, Rfl: 5 .  aspirin 81 MG tablet, Take 81 mg by mouth daily.  , Disp: , Rfl:  .  tiotropium (SPIRIVA) 18 MCG inhalation capsule, Place 1 capsule (18 mcg total) into inhaler and inhale daily., Disp: 30 capsule, Rfl: 11 .  [DISCONTINUED] sildenafil (VIAGRA) 50 MG tablet, Take 1 tablet (50 mg total) by mouth daily as needed for erectile dysfunction., Disp: 10 tablet, Rfl: 0  Allergies: No Known Allergies  Past Medical History, Surgical history, Social history, and Family History were reviewed and updated.  Review of Systems: As above  Physical Exam:  height is 6\' 1"  (1.854 m) and weight is 304 lb (137.893 kg). His oral temperature is 97.4 F (36.3 C). His blood pressure is 124/86 and his pulse is  90. His respiration is 14.   Obese white gentleman in no obvious distress. Head exam shows some facial plethora. He has no oral lesions. Conjunctiva are injected. Lungs are with some decrease at the bases. Cardiac exam regular i rate and rhythm .He has a 1/6 systolic murmur. Abdomen is obese but soft. There is no palpable liver or spleen tip. Extremities shows chronic stasis dermatitis changes. He has minimal chronic lower extremity edema. He has good range of motion of his joints. Has good strength in upper lower extremities. He has decent pulses in the distal pulses. Skin exam shows no ecchymosis or petechia. Neurological exam is nonfocal.  Lab Results  Component Value Date   WBC 8.9 12/16/2014   HGB 17.9* 12/16/2014   HCT 52.5* 12/16/2014   MCV 101* 12/16/2014   PLT 149 12/16/2014     Chemistry      Component Value Date/Time   NA 138 06/08/2014 1419   NA 139 12/08/2013 0840   K 4.2 06/08/2014 1419   K 3.8 12/08/2013 0840   CL 102 06/08/2014 1419   CL 98 12/08/2013 0840   CO2 28 06/08/2014 1419   CO2 30 12/08/2013 0840   BUN 12 06/08/2014 1419   BUN 12 12/08/2013 0840   CREATININE 0.88 06/08/2014 1419   CREATININE 0.8 12/08/2013 0840      Component Value Date/Time   CALCIUM 9.2 06/08/2014 1419   CALCIUM 8.9 12/08/2013 0840   ALKPHOS  56 06/08/2014 1419   ALKPHOS 45 12/08/2013 0840   AST 20 06/08/2014 1419   AST 22 12/08/2013 0840   ALT 28 06/08/2014 1419   ALT 33 12/08/2013 0840   BILITOT 0.5 06/08/2014 1419   BILITOT 1.20 12/08/2013 0840         Impression and Plan: Billy [name] is a 44 year old gentleman. He has secondary polycythemia. He has obesity and hypoventilation syndrome. The CPAP is helping.   His blood count is up a little bit. He thinks it probably is because of him smoking. He's had talked to his pulmonary doctor to see about getting on some medicine to help stop smoking.  He also is drinking quite a bit. He enjoys ear.  He really does not like to be  phlebotomized. I don't think we have a problem with that right now.  He has been asymptomatic despite his erythrocytosis.  His platelet count is improving  but I suspect that this might be reflective of a low iron state.  I'll plan to get him back in another 6 months.  I don't see that we have to get back any sooner for any lab work.   Volanda Napoleon, MD 10/19/20163:18 PM

## 2014-12-17 LAB — IRON AND TIBC CHCC
%SAT: 55 % (ref 20–55)
Iron: 177 ug/dL — ABNORMAL HIGH (ref 42–163)
TIBC: 323 ug/dL (ref 202–409)
UIBC: 146 ug/dL (ref 117–376)

## 2014-12-17 LAB — FERRITIN CHCC: Ferritin: 398 ng/ml — ABNORMAL HIGH (ref 22–316)

## 2015-03-09 ENCOUNTER — Ambulatory Visit: Payer: PPO | Admitting: Emergency Medicine

## 2015-03-16 ENCOUNTER — Ambulatory Visit (INDEPENDENT_AMBULATORY_CARE_PROVIDER_SITE_OTHER): Payer: PPO | Admitting: Emergency Medicine

## 2015-03-16 ENCOUNTER — Encounter: Payer: Self-pay | Admitting: Emergency Medicine

## 2015-03-16 VITALS — BP 112/80 | HR 112 | Ht 72.0 in | Wt 306.0 lb

## 2015-03-16 DIAGNOSIS — J449 Chronic obstructive pulmonary disease, unspecified: Secondary | ICD-10-CM | POA: Diagnosis not present

## 2015-03-16 DIAGNOSIS — G4733 Obstructive sleep apnea (adult) (pediatric): Secondary | ICD-10-CM

## 2015-03-16 NOTE — Assessment & Plan Note (Signed)
Will continue to progress with his continued smoking. Discussed cessation in more detail, also EtOH cessation. He is interested but has been unable this far. Techniques reviewed today. Continue same BD's. Need to establish his O2 needs given his polycythemia. - uses at night

## 2015-03-16 NOTE — Patient Instructions (Signed)
Please continue your inhaled medications and your CPAP as you are using them  We discussed rationing your cigarettes in ziploc bags > put 10 in a bag and that is your supply for the day. Get used to this pattern over time. Don't try to decrease, just ration out the 10 cigarettes and get them out of your daily supply. We can discuss decreasing in the future.  Follow with Dr Lamonte Sakai in 6 months or sooner if you have any problems

## 2015-03-16 NOTE — Progress Notes (Signed)
Subjective:    Patient ID: Billy [name], male    DOB: 07/08/1970, 45y.o.   MRN: [id number] HPI 45yo male with initial pulmonary consult during hospitalization 05/30/10 for severe OSA, pulmonary HTN, hypoxemia   ROV 05/12/11 -- OSA/OHS, severe COPD with associated hypoxemia and polycythemia. Still smokes 1 pk/day. Has been reliable with CPAP, less reliable with his BD's due to expense of his meds. Has been using it sparingly. Not wearing o2 here today - the O2 has been irritating his nose.   ROV 10/13/11 -- OSA/OHS, severe COPD with associated hypoxemia and polycythemia. Still smokes 1 pk/day. Not using his BD's - doesn;t have his BD's, can't afford them, needs to fill out his financial assistance paperwork. He ran out of symbicort 3 weeks ago. He doesn't take Spiriva every day, uses it about 2x a week. Wear his his CPAP reliably. He won't have insurance until next April.   ROV 01/11/12 -- OSA/OHS with good CPAP compliance, severe COPD with associated hypoxemia and polycythemia. Still smokes ~1 pk/day. Here for regular follow up. He is using O2 prn, was not desaturated on exam today. He uses Symbicort and Spiriva reliably now. Uses albuterol prn >> averages few times a week    Acute OV 06/07/12 --  Complains of prod cough with clear/light yellow mucus, wheezing, increased SOB x1 week - denies f/c/s, tightness.  has been out of spiriva x1 week and reports does not take regularly/  Still smoking 1/2-1 PPD . Discussed smoking cessation  Not taking Spriva on reg basis.  On disability now has insurance to cover rx. Will need to send refills to pharm.  No hemoptysis , chest pain or orthopnea, edema , calf pain , fever.   ROV 06/25/12 -- OSA/OHS with good CPAP compliance, severe COPD with associated hypoxemia and polycythemia. Still smokes ~1 pk/day. Was treated 4/11 with pred + doxy for an AE.  He was off Spiriva for a while due to cost; he has assistance getting the Symbicort. No phlebotomy for over a  year. He is having some nasal allergies   ROV 11/07/12 -- OSA/OHS with good CPAP compliance, severe COPD with associated hypoxemia and polycythemia. Still smokes ~1 pk/day. He doesn't wear O2 with all exertion. His cough is a little worse, has a tickle in his chest. EtOH use is significant.   ROV 03/05/13 -- OSA/OHS with good CPAP compliance, severe COPD with associated hypoxemia and polycythemia. Medication compliance and affordability are issues - unable to get his meds, hasn't been able to get Spiriva so has never taken more than 10 days straight. Using albuterol a few times a week. He has had enough Symbicort to stay reliable with it. He feels that his breathing is pretty good, does have exertional limitations. Hasn't had to have phlebotomy in over a year.   ROV 10/28/13 -- follows for OSA/OHS with good CPAP compliance, severe COPD with associated hypoxemia and polycythemia. He returns today for regular followup.. Continues to smoke - less than before. He is having trouble with his CPAP > seems to be contributing to some loosening of his lower teeth. He has lost some weight > 23 lbs. His breathing has benefited. He is taking his Spiriva and Symbicort reliably. Not using O2.   His Mom has Wegener's   ROV 05/01/14 -- follow up visit for hypoxemia, severe COPD and OSA/OHS. Still smoking. He has gained back 14 lbs. He tried the nasal mask but his mouth opened too much. His exertional tolerance is ok when  he paces himself.  He has not had any flares, abx, pred, hospitalizations.   ROV 11/18/14 -- follow-up visit for tobacco use, severe COPD, OSA/OHS and chronic hypoxemic respiratory failure. He was seen in May for an acute exacerbation of his COPD treated with cortisone steroids and antibiotics. He has not yet had PFT.  He has been able to start using his CPAP every night, using a full face mask. He can tell that he is benefiting, feels better during the day. He remains on Spiriva and Symbicort, has been  reliable. Uses albuterol about 1-2x a day. He is smoking about 1/2 pk a day. He hasn't had PFT yet. Has O2 bled into CPAP, does not need during the day.   ROV 03/16/15 -- follow-up visit for chronic hypoxemic respiratory failure in the setting of severe COPD, OSA/OHS, tobacco use. At our last visit we discussed smoking cessation. He tells me that he is still smoking about 1/2 pk a day. He is stable - is able to exert but not nearly as long as years ago. He is drinking a lot - about 12 beers a day.  He knows that he needs to stop smoking. He is using his CPAP reliably.    Objective:   Physical Exam  Filed Vitals:   03/16/15 0935  BP: 112/80  Pulse: 112  Height: 6' (1.829 m)  Weight: 306 lb (138.801 kg)  SpO2: 94%   Gen: Pleasant, obese, in no distress,  normal affect  ENT: No lesions,  mouth clear,  oropharynx clear, no postnasal drip  Neck: No JVD, no TMG, no carotid bruits  Lungs: clear B, no wheezes  Cardiovascular: RRR, heart sounds normal, no murmur or gallops, trace LE edema  Musculoskeletal: No deformities, no cyanosis or clubbing  Neuro: alert, non focal  Skin: some chronic venous stasis changes B LE   CBC    Component Value Date/Time   WBC 8.9 12/16/2014 1407   WBC 8.3 06/01/2010 1537   RBC 5.20 12/16/2014 1407   RBC 5.08 08/03/2011 1439   RBC 6.44* 06/01/2010 1537   HGB 17.9* 12/16/2014 1407   HGB 20.7* 06/01/2010 1537   HCT 52.5* 12/16/2014 1407   HCT 68.2* 06/01/2010 1537   PLT 149 12/16/2014 1407   PLT * 06/01/2010 1537    93 PLATELET COUNT CONFIRMED BY SMEAR LARGE PLATELETS PRESENT   MCV 101* 12/16/2014 1407   MCV 105.9* 06/01/2010 1537   MCH 34.4* 12/16/2014 1407   MCH 32.1 06/01/2010 1537   MCHC 34.1 12/16/2014 1407   MCHC 30.4 06/01/2010 1537   RDW 12.1 12/16/2014 1407   RDW 15.8* 06/01/2010 1537   LYMPHSABS 2.0 12/16/2014 1407   LYMPHSABS 0.9 06/01/2010 1537   MONOABS 1.0 06/01/2010 1537   EOSABS 0.2 12/16/2014 1407   EOSABS 0.2 06/01/2010  1537   BASOSABS 0.1 12/16/2014 1407   BASOSABS 0.0 06/01/2010 1537      Assessment & Plan:  COPD (chronic obstructive pulmonary disease) Will continue to progress with his continued smoking. Discussed cessation in more detail, also EtOH cessation. He is interested but has been unable this far. Techniques reviewed today. Continue same BD's. Need to establish his O2 needs given his polycythemia. - uses at night  Obstructive sleep apnea Good compliance CPAp + o2

## 2015-03-16 NOTE — Assessment & Plan Note (Signed)
Good compliance CPAp + o2

## 2015-04-02 DIAGNOSIS — D751 Secondary polycythemia: Secondary | ICD-10-CM | POA: Diagnosis not present

## 2015-04-02 DIAGNOSIS — G4733 Obstructive sleep apnea (adult) (pediatric): Secondary | ICD-10-CM | POA: Diagnosis not present

## 2015-04-02 DIAGNOSIS — E669 Obesity, unspecified: Secondary | ICD-10-CM | POA: Diagnosis not present

## 2015-04-02 DIAGNOSIS — J449 Chronic obstructive pulmonary disease, unspecified: Secondary | ICD-10-CM | POA: Diagnosis not present

## 2015-04-02 DIAGNOSIS — J45909 Unspecified asthma, uncomplicated: Secondary | ICD-10-CM | POA: Diagnosis not present

## 2015-04-13 ENCOUNTER — Telehealth: Payer: Self-pay | Admitting: Pulmonary Disease

## 2015-04-14 NOTE — Telephone Encounter (Signed)
Entered by mistake

## 2015-06-16 ENCOUNTER — Other Ambulatory Visit: Payer: PPO

## 2015-06-16 ENCOUNTER — Ambulatory Visit: Payer: PPO | Admitting: Hematology & Oncology

## 2015-06-30 ENCOUNTER — Telehealth: Payer: Self-pay | Admitting: Emergency Medicine

## 2015-06-30 NOTE — Telephone Encounter (Signed)
He states if this is not done his wages will be garnished and this is the need for urgency.

## 2015-06-30 NOTE — Telephone Encounter (Signed)
Patient dropped off form, please call at [phone] when this is ready to be picked up.  Patient states no abbreviations are to be listed on the form PLEASE, as he had a problem with this last time.  The form was placed in Dr. Agustina Caroli folder up front.

## 2015-06-30 NOTE — Telephone Encounter (Signed)
Spoke with pt.  Pt states he needs a form completed for student loan forgiveness considering he is now on disability.  Pt states form needs to have his dx, COPD and OSA, spelled out (per pt, abbreviations not accepted per loan institution) and signed by RB.  Pt is aware he will need to bring form by for [place] to look at and advise.  He will bring by today.  Pt is requesting form to be completed by Friday and will pick up once completed.  States his wages will be garnished the end of this month if form not submitted.  Will send to [employer] Comment to keep eye out for form.

## 2015-07-01 NOTE — Telephone Encounter (Signed)
Form has been completed and is ready for pick up. Pt is aware. Nothing further was needed.

## 2015-09-13 ENCOUNTER — Ambulatory Visit: Payer: PPO | Admitting: Emergency Medicine

## 2015-10-19 ENCOUNTER — Encounter: Payer: Self-pay | Admitting: Emergency Medicine

## 2015-10-19 ENCOUNTER — Ambulatory Visit (INDEPENDENT_AMBULATORY_CARE_PROVIDER_SITE_OTHER): Payer: PPO | Admitting: Emergency Medicine

## 2015-10-19 DIAGNOSIS — F17200 Nicotine dependence, unspecified, uncomplicated: Secondary | ICD-10-CM | POA: Diagnosis not present

## 2015-10-19 DIAGNOSIS — D751 Secondary polycythemia: Secondary | ICD-10-CM | POA: Diagnosis not present

## 2015-10-19 DIAGNOSIS — J449 Chronic obstructive pulmonary disease, unspecified: Secondary | ICD-10-CM

## 2015-10-19 DIAGNOSIS — F1010 Alcohol abuse, uncomplicated: Secondary | ICD-10-CM

## 2015-10-19 DIAGNOSIS — G4733 Obstructive sleep apnea (adult) (pediatric): Secondary | ICD-10-CM | POA: Diagnosis not present

## 2015-10-19 NOTE — Assessment & Plan Note (Signed)
He needs to discuss decreasing. I've asked him to reestablish with a primary care provider.

## 2015-10-19 NOTE — Assessment & Plan Note (Signed)
Attending current CPAP he has good compliance.

## 2015-10-19 NOTE — Patient Instructions (Addendum)
I agree that you need to have a PCP. Consider re-establishing with Dr Ashby Dawes. You need to be evaluated for your blood pressure, smoking, alcohol exposure, R leg pain.  Notify us if your R leg soreness and lesion worsens in any way Continue to use your CPAP every night Continue your Spiriva and Symbicort Take albuterol 2 puffs up to every 4 hours if needed for shortness of breath.  Get the flu shot in the Fall Follow with Dr Lamonte Sakai in 6 months or sooner if you have any problems

## 2015-10-19 NOTE — Assessment & Plan Note (Signed)
Likely needs to follow-up with hematology. He hasn't seen them and extended period time. I will ask him to do so

## 2015-10-19 NOTE — Assessment & Plan Note (Signed)
We are in a holding pattern until he is going to be able to cut down on his smoking. He indicates that he done believe he can decrease the smoking until he cuts down on his alcohol. He understands the issue but is not prepared to stop either. For now we will continue his bronchodilators as ordered

## 2015-10-19 NOTE — Assessment & Plan Note (Signed)
Discussed cessation and techniques for cutting down in detail today

## 2015-10-19 NOTE — Progress Notes (Signed)
Subjective:    Patient ID: Billy [name], male    DOB: 25-Aug-1970, 45y.o.   MRN: [id number] HPI 45yo male with Severe COPD, severe OSA, pulmonary HTN, hypoxemia   ROV 11/18/14 -- follow-up visit for tobacco use, severe COPD, OSA/OHS and chronic hypoxemic respiratory failure. He was seen in May for an acute exacerbation of his COPD treated with cortisone steroids and antibiotics. He has not yet had PFT.  He has been able to start using his CPAP every night, using a full face mask. He can tell that he is benefiting, feels better during the day. He remains on Spiriva and Symbicort, has been reliable. Uses albuterol about 1-2x a day. He is smoking about 1/2 pk a day. He hasn't had PFT yet. Has O2 bled into CPAP, does not need during the day.   ROV 03/16/15 -- follow-up visit for chronic hypoxemic respiratory failure in the setting of severe COPD, OSA/OHS, tobacco use. At our last visit we discussed smoking cessation. He tells me that he is still smoking about 1/2 pk a day. He is stable - is able to exert but not nearly as long as years ago. He is drinking a lot - about 12 beers a day.  He knows that he needs to stop smoking. He is using his CPAP reliably.   ROV 10/19/15 -- Billy [name] has a history of chronic hypoxemic respiratory failure in the setting of OSA/OHS, severe COPD, continued tobacco use. He has a history of polycythemia in the past. He continued to smoke about 14 cigarettes daily, slightly less. He has gained a little weight. He is compliant with CPAP, feels that he benefits from it, gives him more energy during the day. Uses a full face mask. Remains on spiriva and symbicort, albuterol. He is not sure he wants to stop EtrOH, knows he needs to decrease cigarettes. 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 I believe he needs a new PCP.                                                                                                                                                                                              Objective:   Physical Exam  Vitals:   10/19/15 1412  BP: (!) 134/92  BP Location: Left Arm  Cuff Size: Normal  Pulse: 90  SpO2: 94%  Weight: (!) 312 lb (141.5 kg)  Height: 6' (1.829 m)   Gen: Pleasant, obese, in no distress,  normal affect  ENT: No lesions,  mouth clear,  oropharynx clear, no postnasal drip  Neck: No JVD, no TMG, no carotid bruits  Lungs: clear B, no wheezes  Cardiovascular: RRR, heart sounds normal, no murmur or gallops, trace LE edema  Musculoskeletal: No deformities, no cyanosis or clubbing  Neuro: alert, non focal  Skin: some chronic venous stasis changes B LE   CBC    Component Value Date/Time   WBC 8.9 12/16/2014 1407   WBC 8.3 06/01/2010 1537   RBC 5.20 12/16/2014 1407   RBC 5.08 08/03/2011 1439   RBC 6.44 (H) 06/01/2010 1537   HGB 17.9 (H) 12/16/2014 1407   HCT 52.5 (H) 12/16/2014 1407   PLT 149 12/16/2014 1407   MCV 101 (H) 12/16/2014 1407   MCH 34.4 (H) 12/16/2014 1407   MCH 32.1 06/01/2010 1537   MCHC 34.1 12/16/2014 1407   MCHC 30.4 06/01/2010 1537   RDW 12.1 12/16/2014 1407   LYMPHSABS 2.0 12/16/2014 1407   MONOABS 1.0 06/01/2010 1537   EOSABS 0.2 12/16/2014 1407   BASOSABS 0.1 12/16/2014 1407      Assessment & Plan:  Obstructive sleep apnea Attending current CPAP he has good compliance.  COPD (chronic obstructive pulmonary disease) We are in a holding pattern until he is going to be able to cut down on his smoking. He indicates that he done believe he can decrease the smoking until he cuts down on his alcohol. He understands the issue but is not prepared to stop either. For now we will continue his bronchodilators as ordered  Polycythemia, secondary Likely needs to follow-up with hematology. He hasn't seen them and extended period time. I will ask him to do so  TOBACCO ABUSE Discussed cessation and techniques for cutting down in detail  today  Alcohol abuse He needs to discuss decreasing. I've asked him to reestablish with a primary care provider.  Baltazar Apo, MD, PhD 10/19/2015, 2:36 PM [facility] [phone] or if no answer [phone]

## 2015-11-05 ENCOUNTER — Other Ambulatory Visit: Payer: Self-pay | Admitting: Emergency Medicine

## 2015-12-04 ENCOUNTER — Other Ambulatory Visit: Payer: Self-pay | Admitting: Emergency Medicine

## 2016-03-17 ENCOUNTER — Telehealth: Payer: Self-pay | Admitting: Emergency Medicine

## 2016-03-17 NOTE — Telephone Encounter (Signed)
Pt states he has been using his 2L O2 qhs and prn. Pt states his POC that he had been using qhs had broken. Pt states he contacted [facility], and a home fill system was brought to his house. Pt states he can not sleep with this concentrator, as it is very loud and beeps. Pt is requesting that we send a order to [facility] for a POC at 2L. I have explained to pt that a POC is not meant to be used for night time use every night. I have spoke with Corene Cornea with [facility], who states pt contacted [facility] on 03-14-16 and requested a replacement for his eclipse. [facility] no longer carry eclipse and now use simply go. [facility] advised pt that a simply go will not work with his liter flow. Pt is scheduled for f/u with RB on 04/12/16. Corene Cornea suggest that pt continue with his concentrator until f/u and then be re-evaluated for both time time and day time use oxygen. I have spoke with pt and made him aware.   RB please advise. Thanks.

## 2016-03-22 NOTE — Telephone Encounter (Signed)
Agree with this plan. We can discuss changing to different DME provider when I see him in office if he wants another eclipse

## 2016-04-12 ENCOUNTER — Ambulatory Visit (INDEPENDENT_AMBULATORY_CARE_PROVIDER_SITE_OTHER): Payer: PPO | Admitting: Emergency Medicine

## 2016-04-12 ENCOUNTER — Encounter: Payer: Self-pay | Admitting: Emergency Medicine

## 2016-04-12 VITALS — BP 132/68 | HR 100 | Ht 73.0 in | Wt 315.6 lb

## 2016-04-12 DIAGNOSIS — F1010 Alcohol abuse, uncomplicated: Secondary | ICD-10-CM

## 2016-04-12 DIAGNOSIS — J449 Chronic obstructive pulmonary disease, unspecified: Secondary | ICD-10-CM

## 2016-04-12 DIAGNOSIS — F17200 Nicotine dependence, unspecified, uncomplicated: Secondary | ICD-10-CM

## 2016-04-12 MED ORDER — BUPROPION HCL ER (SR) 150 MG PO TB12: ORAL_TABLET | ORAL | 5 refills | Status: DC

## 2016-04-12 NOTE — Patient Instructions (Signed)
Please continue your inhaled medications as you have been taking them Please continue your CPAP and oxygen every night as you have been using it We will start Wellbutrin. You need to set a cigarette goal for 10 days after the Wellbutrin is started. Cut down to this goal on that date. I recommend getting down to 4-5 cigarettes daily on that day.  Follow with Dr Lamonte Sakai in 6 months or sooner if you have any problems

## 2016-04-12 NOTE — Progress Notes (Signed)
Subjective:    Patient ID: Billy [name], male    DOB: December 02, 1970, 45y.o.   MRN: [id number] HPI 46yo male with Severe COPD, severe OSA, pulmonary HTN, hypoxemia   ROV 11/18/14 -- follow-up visit for tobacco use, severe COPD, OSA/OHS and chronic hypoxemic respiratory failure. He was seen in May for an acute exacerbation of his COPD treated with cortisone steroids and antibiotics. He has not yet had PFT.  He has been able to start using his CPAP every night, using a full face mask. He can tell that he is benefiting, feels better during the day. He remains on Spiriva and Symbicort, has been reliable. Uses albuterol about 1-2x a day. He is smoking about 1/2 pk a day. He hasn't had PFT yet. Has O2 bled into CPAP, does not need during the day.   ROV 03/16/15 -- follow-up visit for chronic hypoxemic respiratory failure in the setting of severe COPD, OSA/OHS, tobacco use. At our last visit we discussed smoking cessation. He tells me that he is still smoking about 1/2 pk a day. He is stable - is able to exert but not nearly as long as years ago. He is drinking a lot - about 12 beers a day.  He knows that he needs to stop smoking. He is using his CPAP reliably.   ROV 10/19/15 -- Billy [name] has a history of chronic hypoxemic respiratory failure in the setting of OSA/OHS, severe COPD, continued tobacco use. He has a history of polycythemia in the past. He continued to smoke about 14 cigarettes daily, slightly less. He has gained a little weight. He is compliant with CPAP, feels that he benefits from it, gives him more energy during the day. Uses a full face mask. Remains on spiriva and symbicort, albuterol. He is not sure he wants to stop EtrOH, knows he needs to decrease cigarettes. 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 I believe he needs a new PCP.   ROV 04/12/16 -- This follow-up visit for patient with a history of obstructive sleep apnea, severe COPD.  He has a history of associated polycythemia. His bronchodilators are Spiriva and Symbicort.  A compliance download is available for his CPAP for the last 3 months. This shows great compliance with greater than 4 hours of use on 91% of the nights. He is on an AutoSet from 5-20 cm water his pressure seems to be optimal between 12-15.  He now has a new concentrator at home, working well. He is cutting down on both EtOH and tobacco, knows that he needs to do so.                                                                                                                                                    Objective:  Physical Exam  Vitals:   04/12/16 1440  BP: 132/68  BP Location: Left Arm  Cuff Size: Normal  Pulse: 100  SpO2: 90%  Weight: (!) 315 lb 9.6 oz (143.2 kg)  Height: 6\' 1"  (1.854 m)   Gen: Pleasant, obese, in no distress,  normal affect  ENT: No lesions,  mouth clear,  oropharynx clear, no postnasal drip  Neck: No JVD, no TMG, no carotid bruits  Lungs: clear B, no wheezes  Cardiovascular: RRR, heart sounds normal, no murmur or gallops, trace LE edema  Musculoskeletal: No deformities, no cyanosis or clubbing  Neuro: alert, non focal  Skin: some chronic venous stasis changes B LE    Assessment & Plan:  TOBACCO ABUSE Discussed cessation strategies in detail with him today. He is interested in trying Wellbutrin. I instructed him on how to take the medication, set a quit date. I think it's more realistic to expect him to go to a decreased # cigarettes on his quit date (as opposed to zero).   COPD (chronic obstructive pulmonary disease) Please continue your inhaled medications as you have been taking them Please continue your CPAP and oxygen every night as you have been using it We will start Wellbutrin. You need to set a cigarette goal for 10 days after the Wellbutrin is started. Cut down to this goal on that date. I recommend getting down to 4-5 cigarettes daily on that  day.  Follow with Dr Lamonte Sakai in 6 months or sooner if you have any problems  Obstructive sleep apnea Great CPAP Compliance. He is benefiting from the use of the device. He gets better sleep. He is better rested in the morning. He has no headaches. He has more daytime energy.  Baltazar Apo, MD, PhD 04/12/2016, 3:19 PM [facility] [phone] or if no answer [phone]

## 2016-04-12 NOTE — Assessment & Plan Note (Signed)
Discussed cessation strategies in detail with him today. He is interested in trying Wellbutrin. I instructed him on how to take the medication, set a quit date. I think it's more realistic to expect him to go to a decreased # cigarettes on his quit date (as opposed to zero).

## 2016-04-12 NOTE — Assessment & Plan Note (Signed)
Great CPAP Compliance. He is benefiting from the use of the device. He gets better sleep. He is better rested in the morning. He has no headaches. He has more daytime energy.

## 2016-04-12 NOTE — Assessment & Plan Note (Signed)
Please continue your inhaled medications as you have been taking them Please continue your CPAP and oxygen every night as you have been using it We will start Wellbutrin. You need to set a cigarette goal for 10 days after the Wellbutrin is started. Cut down to this goal on that date. I recommend getting down to 4-5 cigarettes daily on that day.  Follow with Dr Lamonte Sakai in 6 months or sooner if you have any problems

## 2016-05-18 ENCOUNTER — Other Ambulatory Visit: Payer: Self-pay

## 2016-05-18 MED ORDER — BUPROPION HCL ER (SR) 150 MG PO TB12: ORAL_TABLET | ORAL | 1 refills | Status: DC

## 2016-06-07 ENCOUNTER — Other Ambulatory Visit: Payer: Self-pay | Admitting: Emergency Medicine

## 2016-06-21 ENCOUNTER — Telehealth: Payer: Self-pay | Admitting: Emergency Medicine

## 2016-06-21 NOTE — Telephone Encounter (Signed)
Spoke with the pt  He states he had an insurance change and now needing "o2 testing" per [facility]  He thinks he needs ONO  I called and spoke with Corene Cornea  He states we can just do a nurse visit for o2 recert and we will have to send new order for o2  Spoke with the pt and scheduled him for walk test only tomorrow at 10 am

## 2016-06-22 ENCOUNTER — Ambulatory Visit: Payer: PPO

## 2016-07-03 ENCOUNTER — Encounter: Payer: Self-pay | Admitting: Adult Health

## 2016-07-03 ENCOUNTER — Ambulatory Visit (INDEPENDENT_AMBULATORY_CARE_PROVIDER_SITE_OTHER): Payer: PPO | Admitting: Adult Health

## 2016-07-03 DIAGNOSIS — G4733 Obstructive sleep apnea (adult) (pediatric): Secondary | ICD-10-CM | POA: Diagnosis not present

## 2016-07-03 DIAGNOSIS — J449 Chronic obstructive pulmonary disease, unspecified: Secondary | ICD-10-CM | POA: Diagnosis not present

## 2016-07-03 NOTE — Assessment & Plan Note (Signed)
Compensated without flare  Smoking cessation  Cont on current regimen

## 2016-07-03 NOTE — Assessment & Plan Note (Signed)
Compensated on CPAP .  Check ONO on CPAP w/ RA - per insurance requirement   Plan  Patient Instructions  Continue on Symbicort and Spiriva .  Work on not smoking cessation .  Check ONO on CPAP on room air .  Continue on CPAP At bedtime   Work on weight loss  Follow up with Dr. Lamonte Sakai  In 3 months and As needed

## 2016-07-03 NOTE — Patient Instructions (Addendum)
Continue on Symbicort and Spiriva .  Work on not smoking cessation .  Check ONO on CPAP on room air .  Continue on CPAP At bedtime   Work on weight loss  Follow up with Dr. Lamonte Sakai  In 3 months and As needed

## 2016-07-03 NOTE — Progress Notes (Signed)
@Patient  ID: Billy [name], male    DOB: 1970/12/09, 46 y.o.   MRN: [id number]  Chief Complaint  Patient presents with  . Follow-up    COPD     Referring provider: No ref. provider found  HPI: 46 year old male, active smoker followed for severe COPD, pulmonary hypertension, chronic respiratory failure on oxygen and severe obstructive sleep apnea   07/03/2016 Follow up: COPD , O2 RF , OSA  Patient returns for a three-month follow-up. Patient has known severe COPD. He is maintained on Spiriva and Symbicort. Says overall his breathing is doing okay. No flare of cough or wheezing .   Has severe OSA on CPAP on O2 . He wears each night . Feels rested.  His insurance is requiring oxygen qualification for CPAP use to make sure he still needs O2 with CPAP .   Still smoking , discussed smoking cessation .    No Known Allergies  There is no immunization history for the selected administration types on file for this patient.  Past Medical History:  Diagnosis Date  . COPD (chronic obstructive pulmonary disease) ([place])   . History of bronchitis   . Obesity   . Polycythemia    a. Dr. Marin Olp.Marland KitchenMarland KitchenJAK-2 analysis is negative.  b. likely physiologic secondary to chronic hypoxia (? OSA + COPD + obesity).  c. s/p plebotomy x1  . PVD (peripheral vascular disease) ([place])   . Sleep apnea    a. mod severe by sleep study 9.30.11: AHI 44.8 per hour; oxygen desat to a nadir of 65%; unsuccessful CPAP titration; significant hypoxemia even with supp o2 at 3LPM (dest to 70-80%); needs eval for cardiopulmonary disease and upper airway obstruction  . Tobacco abuse     Tobacco History: History  Smoking Status  . Current Every Day Smoker  . Packs/day: 0.50  . Years: 25.00  . Types: Cigarettes  . Start date: 02/08/1986  Smokeless Tobacco  . Never Used    Comment: 06-08-14  still smoking   Ready to quit: Yes Counseling given: Yes   Outpatient Encounter Prescriptions as of 07/03/2016  Medication Sig    . aspirin 81 MG tablet Take 81 mg by mouth daily.    Marland Kitchen PROAIR HFA 108 (90 Base) MCG/ACT inhaler INHALE 2 PUFFS INTO THE LUNGS EVERY 6 HOURS AS NEEDED FOR SHORTNESS OF BREATH  . SPIRIVA HANDIHALER 18 MCG inhalation capsule PLACE 1 CAPSULE (18 MCG TOTAL) INTO INHALER AND INHALE DAILY.  . SYMBICORT 160-4.5 MCG/ACT inhaler INHALE 2 PUFFS INTO THE LUNGS 2 (TWO) TIMES DAILY.  Marland Kitchen buPROPion (WELLBUTRIN SR) 150 MG 12 hr tablet 1 tablet daily for 3 days followed by 1 tablet twice daily (Patient not taking: Reported on 07/03/2016)   No facility-administered encounter medications on file as of 07/03/2016.      Review of Systems  Constitutional:   No  weight loss, night sweats,  Fevers, chills, fatigue, or  lassitude.  HEENT:   No headaches,  Difficulty swallowing,  Tooth/dental problems, or  Sore throat,                No sneezing, itching, ear ache, nasal congestion, post nasal drip,   CV:  No chest pain,  Orthopnea, PND, swelling in lower extremities, anasarca, dizziness, palpitations, syncope.   GI  No heartburn, indigestion, abdominal pain, nausea, vomiting, diarrhea, change in bowel habits, loss of appetite, bloody stools.   Resp: No shortness of breath with exertion or at rest.  No excess mucus, no productive cough,  No non-productive cough,  No coughing up of blood.  No change in color of mucus.  No wheezing.  No chest wall deformity  Skin: no rash or lesions.  GU: no dysuria, change in color of urine, no urgency or frequency.  No flank pain, no hematuria   MS:  No joint pain or swelling.  No decreased range of motion.  No back pain.    Physical Exam  BP 136/84 (BP Location: Left Arm, Cuff Size: Large)   Pulse 85   Ht 6\' 1"  (1.854 m)   Wt (!) 312 lb 11.2 oz (141.8 kg)   SpO2 95%   BMI 41.26 kg/m   GEN: A/Ox3; pleasant , NAD, obese    HEENT:  [place]/AT,  EACs-clear, TMs-wnl, NOSE-clear, THROAT-clear, no lesions, no postnasal drip or exudate noted. Class 2-3 MP airway   NECK:  Supple  w/ fair ROM; no JVD; normal carotid impulses w/o bruits; no thyromegaly or nodules palpated; no lymphadenopathy.    RESP  Clear  P & A; w/o, wheezes/ rales/ or rhonchi. no accessory muscle use, no dullness to percussion  CARD:  RRR, no m/r/g, no peripheral edema, pulses intact, no cyanosis or clubbing.  GI:   Soft & nt; nml bowel sounds; no organomegaly or masses detected.   Musco: Warm bil, no deformities or joint swelling noted.   Neuro: alert, no focal deficits noted.    Skin: Warm, no lesions or rashes   Lab Results:  CBC   BNP No results found for: BNP  ProBNP  Imaging: No results found.   Assessment & Plan:   COPD (chronic obstructive pulmonary disease) Compensated without flare  Smoking cessation  Cont on current regimen   Obstructive sleep apnea Compensated on CPAP .  Check ONO on CPAP w/ RA - per insurance requirement   Plan  Patient Instructions  Continue on Symbicort and Spiriva .  Work on not smoking cessation .  Check ONO on CPAP on room air .  Continue on CPAP At bedtime   Work on weight loss  Follow up with Dr. Lamonte Sakai  In 3 months and As needed           Rexene Edison, NP 07/03/2016

## 2016-07-03 NOTE — Addendum Note (Signed)
Addended by: Parke Poisson E on: 07/03/2016 04:53 PM   Modules accepted: Orders

## 2016-08-01 ENCOUNTER — Other Ambulatory Visit: Payer: Self-pay | Admitting: Emergency Medicine

## 2016-09-03 ENCOUNTER — Other Ambulatory Visit: Payer: Self-pay | Admitting: Emergency Medicine

## 2016-09-05 ENCOUNTER — Other Ambulatory Visit: Payer: Self-pay | Admitting: Emergency Medicine

## 2016-10-23 ENCOUNTER — Ambulatory Visit: Payer: PPO | Admitting: Emergency Medicine

## 2016-10-24 ENCOUNTER — Encounter: Payer: Self-pay | Admitting: Emergency Medicine

## 2016-10-24 ENCOUNTER — Ambulatory Visit (INDEPENDENT_AMBULATORY_CARE_PROVIDER_SITE_OTHER): Payer: PPO | Admitting: Emergency Medicine

## 2016-10-24 DIAGNOSIS — J449 Chronic obstructive pulmonary disease, unspecified: Secondary | ICD-10-CM

## 2016-10-24 DIAGNOSIS — G4733 Obstructive sleep apnea (adult) (pediatric): Secondary | ICD-10-CM

## 2016-10-24 DIAGNOSIS — R0902 Hypoxemia: Secondary | ICD-10-CM | POA: Diagnosis not present

## 2016-10-24 NOTE — Patient Instructions (Signed)
Please continue your Spiriva and Symbicort as you are taking them  Take albuterol 2 puffs up to every 4 hours if needed for shortness of breath.  Walking oximetry today. We will order oxygen with a portable concentrator at 2L/min to be used with exertion.  Continue your CPAP every night Work on decreasing your cigarettes.  Follow with Dr Lamonte Sakai in 3 months or sooner if you have any problems.

## 2016-10-24 NOTE — Assessment & Plan Note (Signed)
Continue current bronchodilator regimen 

## 2016-10-24 NOTE — Assessment & Plan Note (Signed)
We will perform ambulatory OXIMETRY today, reestablish that he desaturates with ambulation. If so then I will get him a poor blocks and concentrator at 2 L/m to use with all exertion.

## 2016-10-24 NOTE — Progress Notes (Signed)
  Subjective:    Patient ID: Billy [name], male    DOB: 1970/04/25, 46y.o.   MRN: [id number] HPI 46yo male with Severe COPD, severe OSA, pulmonary HTN, hypoxemia   ROV 04/12/16 -- This follow-up visit for patient with a history of obstructive sleep apnea, severe COPD. He has a history of associated polycythemia. His bronchodilators are Spiriva and Symbicort.  A compliance download is available for his CPAP for the last 3 months. This shows great compliance with greater than 4 hours of use on 91% of the nights. He is on an AutoSet from 5-20 cm water his pressure seems to be optimal between 12-15.  He now has a new concentrator at home, working well. He is cutting down on both EtOH and tobacco, knows that he needs to do so.             ROV 10/24/16 -- Patient has history of obesity, severe COPD, OSA/OHS with hypoxemic respiratory failure and associated polycythemia. He continues to smoke. He turned in his oxygen in May 2018 due to cost - all seemed to stem from requirement for ONO on CPAP and then changing his portable concentrator to a large oxygen concentrator.  Apparently also does [facility] money .  Taking cefalexin for R LE cellulitis.  He did not tolerate wellbutrin.  Continues to use spiriva, symbicort.                                                                                                 Objective:   Physical Exam  Vitals:   10/24/16 1642  BP: 138/88  Pulse: (!) 102  SpO2: (!) 89%  Weight: (!) 319 lb 9.6 oz (145 kg)  Height: 6' (1.829 m)   Gen: Pleasant, obese, in no distress,  normal affect  ENT: No lesions,  mouth clear,  oropharynx clear, no postnasal drip  Neck: No JVD, no TMG, no carotid bruits  Lungs: clear B, no wheezes  Cardiovascular: RRR, heart sounds normal, no murmur or gallops, trace LE edema  Musculoskeletal: No deformities, no cyanosis or clubbing  Neuro: alert, non focal  Skin: some chronic venous stasis changes B LE, healing R LE abrasion.      Assessment & Plan:  Obstructive sleep apnea Tolerating CPAP. He needs to have oxygen bled in. His oxygen was turned back in to [facility]. He cannot afford a large oxygen concentrator. We will continue CPAP  COPD (chronic obstructive pulmonary disease) Continue current bronchodilator regimen  HYPOXEMIA We will perform ambulatory OXIMETRY today, reestablish that he desaturates with ambulation. If so then I will get him a poor blocks and concentrator at 2 L/m to use with all exertion.   Baltazar Apo, MD, PhD 10/24/2016, 5:36 PM [facility] [phone] or if no answer [phone]

## 2016-10-24 NOTE — Assessment & Plan Note (Signed)
Tolerating CPAP. He needs to have oxygen bled in. His oxygen was turned back in to [facility]. He cannot afford a large oxygen concentrator. We will continue CPAP

## 2016-10-25 ENCOUNTER — Telehealth: Payer: Self-pay | Admitting: Emergency Medicine

## 2016-10-25 NOTE — Addendum Note (Signed)
Addended by: Desmond Dike C on: 10/25/2016 03:04 PM   Modules accepted: Orders

## 2016-10-25 NOTE — Telephone Encounter (Signed)
Spoke with Gilda. She stated that she has already discussed this patient with [facility] and nothing is needed at this time. Will close this message.

## 2016-10-27 ENCOUNTER — Telehealth: Payer: Self-pay | Admitting: Emergency Medicine

## 2016-10-27 DIAGNOSIS — J449 Chronic obstructive pulmonary disease, unspecified: Secondary | ICD-10-CM | POA: Diagnosis not present

## 2016-10-27 NOTE — Telephone Encounter (Signed)
Will need to call [facility] on 10/31/16 as they are currently closed.

## 2016-10-27 NOTE — Telephone Encounter (Signed)
ATC [facility] but the office was closed. Will call back on Tuesday.

## 2016-10-31 NOTE — Telephone Encounter (Signed)
Magda Paganini can you help with this? You were the one who documented the pt's qualify sats. Thanks.

## 2016-10-31 NOTE — Telephone Encounter (Signed)
Called and spoke with Jackelyn Poling and she stated that on the oxygen sats the ambulating with oxygen was listed as 9% and she thought that this should be 90% but needs this corrected and faxed to her at [phone]--GGYIRSWN # [id number]  ATTN: DEBBIE.  Will forward this over to [place] to follow up on and correct.

## 2016-10-31 NOTE — Telephone Encounter (Signed)
I corrected the sats in the problem list  This was printed and faxed to number provided, attn to [employer]

## 2016-10-31 NOTE — Telephone Encounter (Signed)
This is a duplicate message and this has been corrected

## 2016-11-26 DIAGNOSIS — J449 Chronic obstructive pulmonary disease, unspecified: Secondary | ICD-10-CM | POA: Diagnosis not present

## 2016-12-01 ENCOUNTER — Other Ambulatory Visit: Payer: Self-pay | Admitting: Emergency Medicine

## 2016-12-03 ENCOUNTER — Other Ambulatory Visit: Payer: Self-pay | Admitting: Emergency Medicine

## 2016-12-19 ENCOUNTER — Other Ambulatory Visit: Payer: Self-pay | Admitting: Emergency Medicine

## 2016-12-19 ENCOUNTER — Encounter: Payer: Self-pay | Admitting: Adult Health

## 2016-12-19 ENCOUNTER — Ambulatory Visit (INDEPENDENT_AMBULATORY_CARE_PROVIDER_SITE_OTHER): Payer: PPO | Admitting: Adult Health

## 2016-12-19 VITALS — BP 144/90 | Temp 98.5°F | Ht 72.0 in | Wt 323.0 lb

## 2016-12-19 DIAGNOSIS — Z6841 Body Mass Index (BMI) 40.0 and over, adult: Secondary | ICD-10-CM

## 2016-12-19 DIAGNOSIS — F1010 Alcohol abuse, uncomplicated: Secondary | ICD-10-CM

## 2016-12-19 DIAGNOSIS — Z7689 Persons encountering health services in other specified circumstances: Secondary | ICD-10-CM

## 2016-12-19 DIAGNOSIS — F17200 Nicotine dependence, unspecified, uncomplicated: Secondary | ICD-10-CM | POA: Diagnosis not present

## 2016-12-19 DIAGNOSIS — J449 Chronic obstructive pulmonary disease, unspecified: Secondary | ICD-10-CM | POA: Diagnosis not present

## 2016-12-19 MED ORDER — VARENICLINE TARTRATE 0.5 MG X 11 & 1 MG X 42 PO MISC: ORAL | 0 refills | Status: DC

## 2016-12-19 NOTE — Progress Notes (Signed)
Patient presents to clinic today to establish care. He is a pleasant 46 year old male who  has a past medical history of COPD (chronic obstructive pulmonary disease) ([place]); History of bronchitis; Obesity; Polycythemia; PVD (peripheral vascular disease) ([place]); Sleep apnea; and Tobacco abuse.   Acute Concerns: Establish Care   Chronic Issues: COPD - is followed by Pulmonary. Uses Albuterol, Spiriva, and Symbicort   Sleep Apnea - He uses CPAP and oxygen   Alcohol Abuse - Drinks about 10 beers a day. He understands that this is excessive and that he needs to cut back on the amount he drinks.   Tobacco Use - Smokes about 0.5 - 1.0 packs per day . He understands that he needs to quit. He has used Wellbutrin in the past but did not like the way it made him feel.   Health Maintenance: Dental -- Does not do routine care Vision -- Does not do routine care Immunizations --UTD  Colonoscopy -- Never had  Diet: Does not eat a heart healthy diet.  Exercise: Does not exercise   Is followed by Pulmonary - Dr. Lamonte Sakai   Past Medical History:  Diagnosis Date  . COPD (chronic obstructive pulmonary disease) ([place])   . History of bronchitis   . Obesity   . Polycythemia    a. Dr. Marin Olp.Marland KitchenMarland KitchenJAK-2 analysis is negative.  b. likely physiologic secondary to chronic hypoxia (? OSA + COPD + obesity).  c. s/p plebotomy x1  . PVD (peripheral vascular disease) ([place])   . Sleep apnea    a. mod severe by sleep study 9.30.11: AHI 44.8 per hour; oxygen desat to a nadir of 65%; unsuccessful CPAP titration; significant hypoxemia even with supp o2 at 3LPM (dest to 70-80%); needs eval for cardiopulmonary disease and upper airway obstruction  . Tobacco abuse     Past Surgical History:  Procedure Laterality Date  . knee injury  1982  . TONSILLECTOMY AND ADENOIDECTOMY  1972    Current Outpatient Prescriptions on File Prior to Visit  Medication Sig Dispense Refill  . aspirin 81 MG tablet Take 81 mg by mouth  daily.      Marland Kitchen SPIRIVA HANDIHALER 18 MCG inhalation capsule PLACE 1 CAPSULE INTO INHALER AND INHALE DAILY 30 capsule 5  . SYMBICORT 160-4.5 MCG/ACT inhaler INHALE 2 PUFFS INTO THE LUNGS 2 (TWO) TIMES DAILY. 10.2 Inhaler 5  . [DISCONTINUED] sildenafil (VIAGRA) 50 MG tablet Take 1 tablet (50 mg total) by mouth daily as needed for erectile dysfunction. 10 tablet 0   No current facility-administered medications on file prior to visit.     No Known Allergies  Family History  Problem Relation Age of Onset  . Other Mother        Wegener's Disease  . Lung cancer Paternal Grandmother   . Drug abuse Father   . Alcohol abuse Father   . Asthma Father     Social History   Social History  . Marital status: Divorced    Spouse name: N/A  . Number of children: N/A  . Years of education: N/A   Occupational History  . unemployed    Social History Main Topics  . Smoking status: Current Every Day Smoker    Packs/day: 0.50    Years: 25.00    Types: Cigarettes    Start date: 02/08/1986  . Smokeless tobacco: Never Used     Comment: 06-08-14  still smoking  . Alcohol use 42.0 oz/week    70 Cans of beer per week  .  Drug use: Yes    Frequency: 1.0 time per week    Types: Marijuana     Comment: marajuana...also valium not prescribed  . Sexual activity: Not on file   Other Topics Concern  . Not on file   Social History Narrative  . No narrative on file    Review of Systems  Constitutional: Negative.   HENT: Negative.   Respiratory: Positive for shortness of breath.   Cardiovascular: Negative.   Gastrointestinal: Negative.   Genitourinary: Negative.   Musculoskeletal: Positive for back pain.  Skin: Negative.   Neurological: Negative.   Psychiatric/Behavioral: Negative.   All other systems reviewed and are negative.   BP (!) 144/90 (BP Location: Left Arm)   Temp 98.5 F (36.9 C) (Oral)   Ht 6' (1.829 m)   Wt (!) 323 lb (146.5 kg)   BMI 43.81 kg/m   Physical Exam    Constitutional: He is oriented to person, place, and time and well-developed, well-nourished, and in no distress. No distress.  Obese    Cardiovascular: Normal rate, regular rhythm, normal heart sounds and intact distal pulses.  Exam reveals no gallop and no friction rub.   No murmur heard. Pulmonary/Chest: Effort normal and breath sounds normal. No respiratory distress. He has no wheezes. He has no rales. He exhibits no tenderness.  Musculoskeletal: Normal range of motion. He exhibits no edema, tenderness or deformity.  Neurological: He is alert and oriented to person, place, and time. He has normal reflexes. He displays normal reflexes. No cranial nerve deficit. He exhibits normal muscle tone. Gait normal. Coordination normal. GCS score is 15.  Skin: Skin is warm and dry. No rash noted. He is not diaphoretic. No erythema. No pallor.  Discoloration of lower extremities    Psychiatric: Mood, memory, affect and judgment normal.  Nursing note and vitals reviewed.   Assessment/Plan: 1. Encounter to establish care - Follow up in one month for CPE  - Follow up sooner if needed - needs to quit smoking and cut back on drinking   2. Chronic obstructive pulmonary disease, unspecified COPD type ([place]) - continue with plan of care by pulmonary  - needs to quit smoking   3. Alcohol abuse - Encouraged to start cutting back on alcohol consumption   4. TOBACCO ABUSE  - varenicline (CHANTIX STARTING MONTH PAK) 0.5 MG X 11 & 1 MG X 42 tablet; Take one 0.5 mg tablet by mouth once daily for 3 days, then increase to one 0.5 mg tablet twice daily for 4 days, then increase to one 1 mg tablet twice daily.  Dispense: 53 tablet; Refill: 0 Trial of chantix. Common side effects including rare risk of suicide ideation was discussed with the patient today.  Patient is instructed to go directly to the ED if this occurs.  We discussed that patient can continue to smoke for 1 week after starting chantix, but then  must discontinue cigarettes.  He is also instructed to contact us prior to completion of the starter month pack for an rx for the continuation month pack.  5 minutes spent with patient today on tobacco cessation counseling.    5. Class 3 severe obesity due to excess calories with serious comorbidity and body mass index (BMI) of 40.0 to 44.9 in adult [facility]) - Encouraged to lose weight through diet and exercise  - Consider weight loss clinic   Dorothyann Peng, NP

## 2016-12-19 NOTE — Addendum Note (Signed)
Addended by: Apolinar Junes on: 12/19/2016 05:14 PM   Modules accepted: Level of Service

## 2016-12-19 NOTE — Patient Instructions (Signed)
It was great seeing you today   Please work on quitting smoking and cutting back on drinking   Please follow up in one month for your physical

## 2016-12-20 ENCOUNTER — Ambulatory Visit (INDEPENDENT_AMBULATORY_CARE_PROVIDER_SITE_OTHER): Payer: PPO | Admitting: Podiatry

## 2016-12-20 ENCOUNTER — Telehealth: Payer: Self-pay | Admitting: *Deleted

## 2016-12-20 DIAGNOSIS — I872 Venous insufficiency (chronic) (peripheral): Secondary | ICD-10-CM

## 2016-12-20 DIAGNOSIS — B070 Plantar wart: Secondary | ICD-10-CM

## 2016-12-20 NOTE — Telephone Encounter (Addendum)
-----   Message from Edrick Kins, DPM sent at 12/20/2016 11:33 AM EDT ----- Regarding: Vascular referral Please order vascular consult. Preferably Dr. Servando Snare or Annamarie Major @ [facility]  Diagnosis: Venous insufficiency bilateral  Thanks, Dr. Amalia Hailey. Referral faxed to [facility] for Dr. Donzetta Matters or Dr. Trula Slade.

## 2016-12-20 NOTE — Progress Notes (Signed)
   Subjective: Patient presents today with pain and tenderness on the plantar aspect of the left foot secondary to a plantars wart. Patient states that the pain has been present for several weeks now. Patient denies trauma.  Past Medical History:  Diagnosis Date  . COPD (chronic obstructive pulmonary disease) ([place])   . History of bronchitis   . Obesity   . Polycythemia    a. Dr. Marin Olp.Marland KitchenMarland KitchenJAK-2 analysis is negative.  b. likely physiologic secondary to chronic hypoxia (? OSA + COPD + obesity).  c. s/p plebotomy x1  . PVD (peripheral vascular disease) ([place])   . Sleep apnea    a. mod severe by sleep study 9.30.11: AHI 44.8 per hour; oxygen desat to a nadir of 65%; unsuccessful CPAP titration; significant hypoxemia even with supp o2 at 3LPM (dest to 70-80%); needs eval for cardiopulmonary disease and upper airway obstruction  . Tobacco abuse     Objective: Physical Exam General: The patient is alert and oriented x3 in no acute distress.  Dermatology: Hyperkeratotic skin lesion noted to the plantar aspect of the left foot approximately 1 cm in diameter. Pinpoint bleeding noted upon debridement. Skin is warm, dry and supple bilateral lower extremities. Negative for open lesions or macerations.  Vascular: Palpable pedal pulses bilaterally. No edema or erythema noted. Capillary refill within normal limits. Hemosiderin deposits noted to the bilateral lower extremities consistent with a likely venous insufficiency  Neurological: Epicritic and protective threshold grossly intact bilaterally.   Musculoskeletal Exam: Pain on palpation to the note skin lesion.  Range of motion within normal limits to all pedal and ankle joints bilateral. Muscle strength 5/5 in all groups bilateral.   Assessment: #1 plantar wart left foot #2 venous insufficiency bilateral lower extremities   Plan of Care:  #1 Patient was evaluated. #2 Excisional debridement of the plantar wart lesion was performed using  a chisel blade. Cantharone was applied and the lesion was dressed with a dry sterile dressing. #3 today were going to place a referral for vascular consultation regarding the venous insufficiency and hemosiderin discoloration to the bilateral lower extremities #4 patient is to return to clinic in 2 weeks  Edrick Kins, DPM [facility]  Dr. Edrick Kins, [address]                                        [place], [place] [address]                Office [phone]  Fax [phone]

## 2016-12-27 DIAGNOSIS — J449 Chronic obstructive pulmonary disease, unspecified: Secondary | ICD-10-CM | POA: Diagnosis not present

## 2016-12-28 ENCOUNTER — Other Ambulatory Visit: Payer: Self-pay

## 2016-12-28 DIAGNOSIS — I872 Venous insufficiency (chronic) (peripheral): Secondary | ICD-10-CM

## 2017-01-03 ENCOUNTER — Ambulatory Visit: Payer: PPO | Admitting: Podiatry

## 2017-01-19 ENCOUNTER — Other Ambulatory Visit: Payer: Self-pay | Admitting: Adult Health

## 2017-01-19 DIAGNOSIS — F17200 Nicotine dependence, unspecified, uncomplicated: Secondary | ICD-10-CM

## 2017-01-23 MED ORDER — VARENICLINE TARTRATE 1 MG PO TABS: 1.0000 mg | ORAL_TABLET | Freq: Two times a day (BID) | ORAL | 0 refills | Status: DC

## 2017-01-23 NOTE — Telephone Encounter (Signed)
I can follow up with him at his CPE about chantix   He will need a month of the Continuation pack of chantix. The script that was sent in for refill was for the starting pack

## 2017-01-23 NOTE — Telephone Encounter (Signed)
New rx sent to the pharmacy for 1 month.

## 2017-01-23 NOTE — Telephone Encounter (Signed)
Pt scheduled for 1 month follow up 01/2017 @ 1 PM.  You stated to come back for physical in 1 month.  Move appt?  Keep appt and see pt for follow up of Chantix?

## 2017-01-24 ENCOUNTER — Ambulatory Visit: Payer: PPO | Admitting: Emergency Medicine

## 2017-01-26 DIAGNOSIS — J449 Chronic obstructive pulmonary disease, unspecified: Secondary | ICD-10-CM | POA: Diagnosis not present

## 2017-01-30 ENCOUNTER — Encounter: Payer: Self-pay | Admitting: Adult Health

## 2017-01-30 ENCOUNTER — Ambulatory Visit (INDEPENDENT_AMBULATORY_CARE_PROVIDER_SITE_OTHER): Payer: PPO | Admitting: Adult Health

## 2017-01-30 VITALS — BP 136/80 | Temp 97.9°F | Wt 330.0 lb

## 2017-01-30 DIAGNOSIS — F17200 Nicotine dependence, unspecified, uncomplicated: Secondary | ICD-10-CM | POA: Diagnosis not present

## 2017-01-30 NOTE — Progress Notes (Addendum)
Subjective:    Patient ID: Billy [name], male    DOB: 1970/08/03, 46 y.o.   MRN: [id number]  HPI  He presents to the office today for one month follow up of smoking cessation after starting Chantix   During is last visit he was started on Chantix to help him quit smoking. Today in the office he reports that he tried Chantix for a short time but quit taking it due to "weird dreams and agitation". He reports that he has started cutting back, he is down to 15 cigarettes per day. He would like to try the patch.   He has noticed increased SOB while deer hunting   He is being seen by Vascular next week for lower extremity venous insufficiency    Review of Systems  Constitutional: Negative.   Respiratory: Positive for shortness of breath and wheezing.   Cardiovascular: Negative.   Musculoskeletal: Negative.   Psychiatric/Behavioral: Negative.    Past Medical History:  Diagnosis Date  . COPD (chronic obstructive pulmonary disease) ([place])   . History of bronchitis   . Obesity   . Polycythemia    a. Dr. Marin Olp.Marland KitchenMarland KitchenJAK-2 analysis is negative.  b. likely physiologic secondary to chronic hypoxia (? OSA + COPD + obesity).  c. s/p plebotomy x1  . PVD (peripheral vascular disease) ([place])   . Sleep apnea    a. mod severe by sleep study 9.30.11: AHI 44.8 per hour; oxygen desat to a nadir of 65%; unsuccessful CPAP titration; significant hypoxemia even with supp o2 at 3LPM (dest to 70-80%); needs eval for cardiopulmonary disease and upper airway obstruction  . Tobacco abuse     Social History   Socioeconomic History  . Marital status: Divorced    Spouse name: Not on file  . Number of children: Not on file  . Years of education: Not on file  . Highest education level: Not on file  Social Needs  . Financial resource strain: Not on file  . Food insecurity - worry: Not on file  . Food insecurity - inability: Not on file  . Transportation needs - medical: Not on file  . Transportation  needs - non-medical: Not on file  Occupational History  . Occupation: unemployed  Tobacco Use  . Smoking status: Current Every Day Smoker    Packs/day: 0.50    Years: 25.00    Pack years: 12.50    Types: Cigarettes    Start date: 02/08/1986  . Smokeless tobacco: Never Used  . Tobacco comment: 06-08-14  still smoking  Substance and Sexual Activity  . Alcohol use: Yes    Alcohol/week: 42.0 oz    Types: 70 Cans of beer per week  . Drug use: Yes    Frequency: 1.0 times per week    Types: Marijuana    Comment: marajuana...also valium not prescribed  . Sexual activity: Not on file  Other Topics Concern  . Not on file  Social History Narrative  . Not on file    Past Surgical History:  Procedure Laterality Date  . knee injury  1982  . TONSILLECTOMY AND ADENOIDECTOMY  1972    Family History  Problem Relation Age of Onset  . Other Mother        Wegener's Disease  . Lung cancer Paternal Grandmother   . Drug abuse Father   . Alcohol abuse Father   . Asthma Father     No Known Allergies  Current Outpatient Medications on File Prior to Visit  Medication Sig  Dispense Refill  . albuterol (PROAIR HFA) 108 (90 Base) MCG/ACT inhaler INHALE 2 PUFFS INTO THE LUNGS EVERY 6 HOURS AS NEEDED FOR SHORTNESS OF BREATH 8.5 Inhaler 5  . aspirin 81 MG tablet Take 81 mg by mouth daily.      Marland Kitchen SPIRIVA HANDIHALER 18 MCG inhalation capsule PLACE 1 CAPSULE INTO INHALER AND INHALE DAILY 30 capsule 5  . SYMBICORT 160-4.5 MCG/ACT inhaler INHALE 2 PUFFS INTO THE LUNGS 2 (TWO) TIMES DAILY. 10.2 Inhaler 5  . varenicline (CHANTIX CONTINUING MONTH PAK) 1 MG tablet Take 1 tablet (1 mg total) by mouth 2 (two) times daily. 60 tablet 0  . varenicline (CHANTIX STARTING MONTH PAK) 0.5 MG X 11 & 1 MG X 42 tablet Take one 0.5 mg tablet by mouth once daily for 3 days, then increase to one 0.5 mg tablet twice daily for 4 days, then increase to one 1 mg tablet twice daily. 53 tablet 0  . [DISCONTINUED] sildenafil  (VIAGRA) 50 MG tablet Take 1 tablet (50 mg total) by mouth daily as needed for erectile dysfunction. 10 tablet 0   No current facility-administered medications on file prior to visit.     There were no vitals taken for this visit.      Objective:   Physical Exam  Constitutional: He is oriented to person, place, and time. He appears well-developed and well-nourished. No distress.  Obese   Cardiovascular: Normal rate, regular rhythm, normal heart sounds and intact distal pulses. Exam reveals no gallop and no friction rub.  No murmur heard. Pulmonary/Chest: Effort normal. No respiratory distress. He has wheezes. He has no rales. He exhibits no tenderness.  Neurological: He is alert and oriented to person, place, and time.  Skin: Skin is warm and dry. No rash noted. He is not diaphoretic. No erythema. No pallor.  Psychiatric: He has a normal mood and affect. His behavior is normal. Judgment and thought content normal.  Nursing note and vitals reviewed.      Assessment & Plan:  1. TOBACCO ABUSE - Ok to use patch - can contact [place] [facility] for free supplies   Dorothyann Peng, NP

## 2017-02-05 ENCOUNTER — Encounter: Payer: PPO | Admitting: Surgery

## 2017-02-05 ENCOUNTER — Encounter (HOSPITAL_COMMUNITY): Payer: PPO

## 2017-02-09 ENCOUNTER — Other Ambulatory Visit: Payer: Self-pay | Admitting: Emergency Medicine

## 2017-02-22 ENCOUNTER — Ambulatory Visit (INDEPENDENT_AMBULATORY_CARE_PROVIDER_SITE_OTHER): Payer: PPO | Admitting: Vascular Surgery

## 2017-02-22 ENCOUNTER — Ambulatory Visit (HOSPITAL_COMMUNITY)
Admission: RE | Admit: 2017-02-22 | Discharge: 2017-02-22 | Disposition: A | Discharge status: Home / Self Care | Payer: PPO | Source: Ambulatory Visit | Attending: Surgery | Admitting: Surgery

## 2017-02-22 ENCOUNTER — Encounter: Payer: Self-pay | Admitting: Vascular Surgery

## 2017-02-22 VITALS — BP 141/91 | HR 88 | Temp 98.1°F | Resp 19 | Ht 73.0 in | Wt 330.4 lb

## 2017-02-22 DIAGNOSIS — I87309 Chronic venous hypertension (idiopathic) without complications of unspecified lower extremity: Secondary | ICD-10-CM

## 2017-02-22 DIAGNOSIS — I872 Venous insufficiency (chronic) (peripheral): Secondary | ICD-10-CM | POA: Diagnosis not present

## 2017-02-22 DIAGNOSIS — I87303 Chronic venous hypertension (idiopathic) without complications of bilateral lower extremity: Secondary | ICD-10-CM

## 2017-02-22 NOTE — Progress Notes (Signed)
HISTORY AND PHYSICAL     CC:  Circulation issues Requesting Provider:  Dorothyann Peng, NP  HPI: This is a 46 y.o. male who was evaluated at [facility] in October 2018 for a plantars wart.  He was found to have venous insufficiency of BLE and was referred to [facility] for further evaluation.  He states that he had cellulitis in the R[place] in 2011 and was placed on disability after that.  He states that he gets bumps on his legs sometimes that have pus in them and he is treated with abx and these improve.  He states that he has very dry skin.  He states that he does get some swelling in his ankles but this does not happen all the time and when it does, it is better by the next morning.  He does a lot of sitting at the computer.  He will prop his legs up on the sofa when watching tv.  He denies any pain in his calves with walking.  He states that he will get muscle spasms across his lower back.  He denies any spine issues.  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  He states this has been going on for about 5 years.   He states that he does smoke 1ppd of cigarettes and has for 30 years.  He wants to quit.  He tried Chantix, but he had nightmares and became depressed.  He got the number off a sign in the lobby that he wants to sign up for the class to help quit smoking.  He does see Dr. Lamonte Sakai for his COPD/OSA.  He does use a CPAP.  He does use Etoh daily.    He denies diabetes.  He has a hx of polycythemia and had phlebotomy in the past.   He takes a daily aspirin.    Past Medical History:  Diagnosis Date  . COPD (chronic obstructive pulmonary disease) ([place])   . History of bronchitis   . Obesity   . Polycythemia    a. Dr. Marin Olp.Marland KitchenMarland KitchenJAK-2 analysis is negative.  b. likely physiologic secondary to chronic hypoxia (? OSA + COPD + obesity).  c. s/p plebotomy x1  . PVD (peripheral vascular disease) ([place])   . Sleep apnea    a. mod severe by sleep  study 9.30.11: AHI 44.8 per hour; oxygen desat to a nadir of 65%; unsuccessful CPAP titration; significant hypoxemia even with supp o2 at 3LPM (dest to 70-80%); needs eval for cardiopulmonary disease and upper airway obstruction  . Tobacco abuse     Past Surgical History:  Procedure Laterality Date  . knee injury  1982  . TONSILLECTOMY AND ADENOIDECTOMY  1972    No Known Allergies  Current Outpatient Medications  Medication Sig Dispense Refill  . albuterol (PROAIR HFA) 108 (90 Base) MCG/ACT inhaler INHALE 2 PUFFS INTO THE LUNGS EVERY 6 HOURS AS NEEDED FOR SHORTNESS OF BREATH 8.5 Inhaler 5  . aspirin 81 MG tablet Take 81 mg by mouth daily.      Marland Kitchen SPIRIVA HANDIHALER 18 MCG inhalation capsule PLACE 1 CAPSULE INTO INHALER AND INHALE DAILY 30 capsule 5  . SYMBICORT 160-4.5 MCG/ACT inhaler INHALE 2 PUFFS INTO THE LUNGS 2 (TWO) TIMES DAILY. 10.2 Inhaler 5   No current facility-administered medications for this visit.     Family History  Problem Relation Age of Onset  . Other Mother  Wegener's Disease  . Lung cancer Paternal Grandmother   . Drug abuse Father   . Alcohol abuse Father   . Asthma Father     Social History   Socioeconomic History  . Marital status: Divorced    Spouse name: Not on file  . Number of children: Not on file  . Years of education: Not on file  . Highest education level: Not on file  Social Needs  . Financial resource strain: Not on file  . Food insecurity - worry: Not on file  . Food insecurity - inability: Not on file  . Transportation needs - medical: Not on file  . Transportation needs - non-medical: Not on file  Occupational History  . Occupation: unemployed  Tobacco Use  . Smoking status: Current Every Day Smoker    Packs/day: 1.00    Years: 25.00    Pack years: 25.00    Types: Cigarettes    Start date: 02/08/1986  . Smokeless tobacco: Never Used  . Tobacco comment: 06-08-14  still smoking  Substance and Sexual Activity  . Alcohol  use: Yes    Alcohol/week: 42.0 oz    Types: 70 Cans of beer per week  . Drug use: Yes    Frequency: 1.0 times per week    Types: Marijuana    Comment: marajuana...also valium not prescribed  . Sexual activity: Not on file  Other Topics Concern  . Not on file  Social History Narrative  . Not on file     REVIEW OF SYSTEMS:   [X]  denotes positive finding, [ ]  denotes negative finding Cardiac  Comments:  Chest pain or chest pressure:    Shortness of breath upon exertion: x   Short of breath when lying flat:    Irregular heart rhythm:        Vascular    Pain in calf, thigh, or hip brought on by ambulation: x   Pain in feet at night that wakes you up from your sleep:     Blood clot in your veins:    Leg swelling:         Pulmonary    Oxygen at home: x   Productive cough:     Wheezing:  x       Neurologic    Sudden weakness in arms or legs:     Sudden numbness in arms or legs:     Sudden onset of difficulty speaking or slurred speech:    Temporary loss of vision in one eye:     Problems with dizziness:         Gastrointestinal    Blood in stool:     Vomited blood:         Genitourinary    Burning when urinating:     Blood in urine:        Psychiatric    Major depression:         Hematologic    Bleeding problems:    Problems with blood clotting too easily:        Skin    Rashes or ulcers: x       Constitutional    Fever or chills:      PHYSICAL EXAMINATION:  Vitals:   02/22/17 1445  BP: (!) 141/91  Pulse: 88  Resp: 19  Temp: 98.1 F (36.7 C)  SpO2: 94%   Vitals:   02/22/17 1445  Weight: (!) 330 lb 6.4 oz (149.9 kg)  Height: 6\' 1"  (1.854 m)  Body mass index is 43.59 kg/m.  General:  WDWN in NAD; vital signs documented above Gait: Not observed HENT: WNL, normocephalic Pulmonary: normal non-labored breathing , without Rales, rhonchi,  wheezing Cardiac: regular HR, without  Murmurs without carotid bruits Abdomen: obese Skin: without  rashes Vascular Exam/Pulses:  Right Left  Radial 2+ (normal) 2+ (normal)  Ulnar Unable to palpate  Unable to palpate   DP 2+ (normal) 2+ (normal)  PT Unable to palpate  Unable to palpate    Extremities:     Musculoskeletal: no muscle wasting or atrophy  Neurologic: A&O X 3;  No focal weakness or paresthesias are detected Psychiatric:  The pt has Normal affect.   Non-Invasive Vascular Imaging:   Lower extremity venous duplex study 02/22/17: No evidence of deep or superficial thrombus  Venous incompetence noted in the right CFV No evidence of bilateral great or small saphenous vein incompetence No evidence of left leg deep vein reflux  Pt meds includes: Statin:  No. Beta Blocker:  No. Aspirin:  Yes ACEI:  No. ARB:  No. CCB use:  No Other Antiplatelet/Anticoagulant:  No   ASSESSMENT/PLAN:: 46 y.o. male with venous hypertension    -pt with venous hypertension most likely due to obesity.  Pt with hemosiderin changes in bilateral lower extremities and occasional swelling.  He does have palpable DP pulses bilaterally.  -Dr. Oneida Alar discussed with pt that he needs to wear at least knee high compression stockings and was given information on [employer] in [place].  He would benefit from thigh high compression, but if he is willing to wear knee high compression, at least he would get some benefit from this.  He also discussed with him the need to lose weight and discussed weight loss surgery with pt.  He is not interested at this time, but is willing to start walking and increasing his activity to lose weight to help improve with his overall health.  Smoking cessation was also discussed with the pt and its importance.  He is going to sign up for the smoking cessation class.   -he will follow up as needed.     Leontine Locket, PA-C [facility] [phone]  Clinic MD:  Pt seen and examined with Dr. Oneida Alar  History and exam details as above.  Patient most  likely has venous hypertension secondary to obesity.  He did not really have significant reflux in his venous system on duplex exam today.  I discussed with the patient that weight loss overall would improve his symptoms.  Also discussed with him weight loss reduction surgery.  However, he states that he would rather continue to consume alcohol and consider a weight loss reduction operation.  He is going to try to exercise some and try to lose smoking.  He will follow-up with [place] on an as-needed basis.   Ruta Hinds, MD [facility] [place] Office: [phone] Pager: [phone]

## 2017-02-26 DIAGNOSIS — J449 Chronic obstructive pulmonary disease, unspecified: Secondary | ICD-10-CM | POA: Diagnosis not present

## 2017-03-06 ENCOUNTER — Encounter: Payer: Self-pay | Admitting: Emergency Medicine

## 2017-03-06 ENCOUNTER — Telehealth: Payer: Self-pay | Admitting: Emergency Medicine

## 2017-03-06 ENCOUNTER — Ambulatory Visit: Payer: PPO | Admitting: Emergency Medicine

## 2017-03-06 VITALS — BP 110/72 | HR 98 | Ht 73.0 in | Wt 326.0 lb

## 2017-03-06 DIAGNOSIS — J449 Chronic obstructive pulmonary disease, unspecified: Secondary | ICD-10-CM

## 2017-03-06 NOTE — Telephone Encounter (Signed)
Pt was not seen today as RB is sick. lmtcb x1 for pt.

## 2017-03-06 NOTE — Telephone Encounter (Signed)
Spoke with pt. He states that he is needing to switch DME's for his CPAP and oxygen. [facility] is no longer in network with his insurance. Pt is needing to switch to [facility]. Advised pt that we would have to requalify him for oxygen. Pt has been scheduled to see TP on 03/07/17 at 9:30am. Nothing further was needed.

## 2017-03-06 NOTE — Telephone Encounter (Signed)
Pt is in the office today. I will have [facility] address with pt.

## 2017-03-07 ENCOUNTER — Encounter: Payer: Self-pay | Admitting: Adult Health

## 2017-03-07 ENCOUNTER — Ambulatory Visit (INDEPENDENT_AMBULATORY_CARE_PROVIDER_SITE_OTHER): Payer: PPO | Admitting: Adult Health

## 2017-03-07 VITALS — BP 126/64 | HR 86 | Ht 73.0 in | Wt 330.0 lb

## 2017-03-07 DIAGNOSIS — G4733 Obstructive sleep apnea (adult) (pediatric): Secondary | ICD-10-CM

## 2017-03-07 DIAGNOSIS — J449 Chronic obstructive pulmonary disease, unspecified: Secondary | ICD-10-CM

## 2017-03-07 DIAGNOSIS — R0902 Hypoxemia: Secondary | ICD-10-CM

## 2017-03-07 NOTE — Assessment & Plan Note (Signed)
Cont on o2 with act and At bedtime   

## 2017-03-07 NOTE — Progress Notes (Signed)
@Patient  ID: Billy [name], male    DOB: 08/07/1970, 47 y.o.   MRN: [id number]  Chief Complaint  Patient presents with  . Follow-up    COPD     Referring provider: Dorothyann Peng, NP  HPI: 47 year old male, active smoker followed for severe COPD, pulmonary hypertension, chronic respiratory failure on oxygen and severe obstructive sleep apnea  TEST  Sleep study 2011>AHI 44/hr  PFT  03/07/2017 Follow up : COPD ,O2 RF and OSA  Patient returns for a 69-month follow-up.  Patient remains on Symbicort and Spiriva. Says overall his breathing is doing okay.  He gets winded if he has to walk any amount of distance.  He denies flare of cough or wheezing.  Patient does continue to smoke.  Smoking cessation was encouraged.  Patient is considering signing up for quit smoking program through the local hospital.  Patient is on CPAP at bedtime.  Says he is doing well.  He feels rested.  He is changing home care companies due to new insurance.  He needs an order for new supplies sent to his DME company.  Patient is on oxygen 2 L with activity and at bedtime with CPAP.  Patient needs order for new oxygen to his new homecare company.  O2 saturations drop walking today in the office on room air to 85%.  Was able to  maintain O2 saturations greater than 90% on 2 L walking. Feels oxygen really helps him with his shortness of breath  No Known Allergies  There is no immunization history for the selected administration types on file for this patient.  Past Medical History:  Diagnosis Date  . COPD (chronic obstructive pulmonary disease) ([place])   . History of bronchitis   . Obesity   . Polycythemia    a. Dr. Marin Olp.Marland KitchenMarland KitchenJAK-2 analysis is negative.  b. likely physiologic secondary to chronic hypoxia (? OSA + COPD + obesity).  c. s/p plebotomy x1  . PVD (peripheral vascular disease) ([place])   . Sleep apnea    a. mod severe by sleep study 9.30.11: AHI 44.8 per hour; oxygen desat to a nadir of 65%;  unsuccessful CPAP titration; significant hypoxemia even with supp o2 at 3LPM (dest to 70-80%); needs eval for cardiopulmonary disease and upper airway obstruction  . Tobacco abuse     Tobacco History: Social History   Tobacco Use  Smoking Status Current Every Day Smoker  . Packs/day: 0.50  . Years: 25.00  . Pack years: 12.50  . Types: Cigarettes  . Start date: 02/08/1986  Smokeless Tobacco Never Used  Tobacco Comment   06-08-14  still smoking   Ready to quit: No Counseling given: Yes Comment: 06-08-14  still smoking   Outpatient Encounter Medications as of 03/07/2017  Medication Sig  . albuterol (PROAIR HFA) 108 (90 Base) MCG/ACT inhaler INHALE 2 PUFFS INTO THE LUNGS EVERY 6 HOURS AS NEEDED FOR SHORTNESS OF BREATH  . aspirin 81 MG tablet Take 81 mg by mouth daily.    Marland Kitchen SPIRIVA HANDIHALER 18 MCG inhalation capsule PLACE 1 CAPSULE INTO INHALER AND INHALE DAILY  . SYMBICORT 160-4.5 MCG/ACT inhaler INHALE 2 PUFFS INTO THE LUNGS 2 (TWO) TIMES DAILY.   No facility-administered encounter medications on file as of 03/07/2017.      Review of Systems  Constitutional:   No  weight loss, night sweats,  Fevers, chills,  +fatigue, or  lassitude.  HEENT:   No headaches,  Difficulty swallowing,  Tooth/dental problems, or  Sore throat,  No sneezing, itching, ear ache, nasal congestion, post nasal drip,   CV:  No chest pain,  Orthopnea, PND, swelling in lower extremities, anasarca, dizziness, palpitations, syncope.   GI  No heartburn, indigestion, abdominal pain, nausea, vomiting, diarrhea, change in bowel habits, loss of appetite, bloody stools.   Resp .  No excess mucus, no productive cough,  No non-productive cough,  No coughing up of blood.  No change in color of mucus.  No wheezing.  No chest wall deformity  Skin: no rash or lesions.  GU: no dysuria, change in color of urine, no urgency or frequency.  No flank pain, no hematuria   MS:  No joint pain or swelling.  No  decreased range of motion.  No back pain.    Physical Exam  BP 126/64 (BP Location: Left Arm, Cuff Size: Normal)   Pulse 86   Ht 6\' 1"  (1.854 m)   Wt (!) 330 lb (149.7 kg)   SpO2 93%   BMI 43.54 kg/m   GEN: A/Ox3; pleasant , NAD, obese    HEENT:  [place]/AT,  EACs-clear, TMs-wnl, NOSE-clear, THROAT-clear, no lesions, no postnasal drip or exudate noted. Class 2-3 MP airway  NECK:  Supple w/ fair ROM; no JVD; normal carotid impulses w/o bruits; no thyromegaly or nodules palpated; no lymphadenopathy.    RESP  Clear  P & A; w/o, wheezes/ rales/ or rhonchi. no accessory muscle use, no dullness to percussion  CARD:  RRR, no m/r/g, tr  peripheral edema, pulses intact, no cyanosis or clubbing.  GI:   Soft & nt; nml bowel sounds; no organomegaly or masses detected.   Musco: Warm bil, no deformities or joint swelling noted.   Neuro: alert, no focal deficits noted.    Skin: Warm, no lesions or rashes    Lab Results:  CBC   BMET  BNP No results found for: BNP  ProBNP  Imaging: No results found.   Assessment & Plan:   COPD (chronic obstructive pulmonary disease) Stable   Plan  Patient Instructions  Continue on Symbicort and Spiriva . Rinse after use.  Work on not smoking cessation .   Continue on CPAP At bedtime   Continue on Oxygen 2l/m with activity and At bedtime   Work on weight loss  Follow up with Dr. Lamonte Sakai  In 3 -4 months and As needed         Obstructive sleep apnea Cont on CPAP At bedtime    HYPOXEMIA Cont on o2 with act and At bedtime       Rexene Edison, NP 03/07/2017

## 2017-03-07 NOTE — Patient Instructions (Addendum)
Continue on Symbicort and Spiriva . Rinse after use.  Work on not smoking cessation .   Continue on CPAP At bedtime   Continue on Oxygen 2l/m with activity and At bedtime   Work on weight loss  Follow up with Dr. Lamonte Sakai  In 3 -4 months and As needed

## 2017-03-07 NOTE — Assessment & Plan Note (Signed)
Cont on CPAP At bedtime  

## 2017-03-07 NOTE — Assessment & Plan Note (Signed)
Stable   Plan  Patient Instructions  Continue on Symbicort and Spiriva . Rinse after use.  Work on not smoking cessation .   Continue on CPAP At bedtime   Continue on Oxygen 2l/m with activity and At bedtime   Work on weight loss  Follow up with Dr. Lamonte Sakai  In 3 -4 months and As needed

## 2017-03-09 ENCOUNTER — Telehealth: Payer: Self-pay | Admitting: Emergency Medicine

## 2017-03-09 NOTE — Telephone Encounter (Signed)
     Left voice mail on machine for patient to return phone call back regarding message from [place] in regards to O2 POC. Checked RB box, no fax from [facility] is in there at this time on this patient X1 Phone [phone]   Fax [phone]

## 2017-03-15 NOTE — Telephone Encounter (Signed)
Called [facility] and spoke with Nicole Kindred. He states that [place] doesn't need anything from [place]. They have been in contact with the pt and have notified him that they need items from him before they can process his order. Nothing further was needed.

## 2017-03-19 ENCOUNTER — Ambulatory Visit (INDEPENDENT_AMBULATORY_CARE_PROVIDER_SITE_OTHER): Payer: PPO | Admitting: Family Medicine

## 2017-03-19 ENCOUNTER — Telehealth: Payer: Self-pay | Admitting: Adult Health

## 2017-03-19 VITALS — BP 140/58 | HR 106 | Temp 99.5°F | Ht 73.0 in | Wt 319.0 lb

## 2017-03-19 DIAGNOSIS — G4733 Obstructive sleep apnea (adult) (pediatric): Secondary | ICD-10-CM

## 2017-03-19 DIAGNOSIS — J441 Chronic obstructive pulmonary disease with (acute) exacerbation: Secondary | ICD-10-CM | POA: Diagnosis not present

## 2017-03-19 MED ORDER — DOXYCYCLINE HYCLATE 100 MG PO CAPS: 100.0000 mg | ORAL_CAPSULE | Freq: Two times a day (BID) | ORAL | 0 refills | Status: DC

## 2017-03-19 MED ORDER — PREDNISONE 10 MG PO TABS: ORAL_TABLET | ORAL | 0 refills | Status: DC

## 2017-03-19 NOTE — Patient Instructions (Signed)
Get back on home Oxygen Follow up for any fever or increased shortness of breath.

## 2017-03-19 NOTE — Progress Notes (Signed)
Subjective:     Patient ID: Billy [name], male   DOB: 1970-06-13, 47 y.o.   MRN: [id number]  HPI Patient is a smoker with history of COPD who presents with some progressive respiratory symptoms since last Thursday. He developed some cough then and has had some progressive productive sputum since then. He takes Spiriva and Symbicort at baseline and has been using albuterol as needed.  He's had some mild body aches and chills but has not taken his temperature. No hemoptysis. Has home nebulizer and also has home oxygen which has been using past few days. He states his baseline pulse oximetry is around 92% off oxygen. Dyspnea exacerbated by activity. No chest pain.  History of heavy alcohol use up to 12-15 beers per day. He is considering scheduling this vaccine. He realizes he needs to lose some weight.  Past Medical History:  Diagnosis Date  . COPD (chronic obstructive pulmonary disease) ([place])   . History of bronchitis   . Obesity   . Polycythemia    a. Dr. Marin Olp.Marland KitchenMarland KitchenJAK-2 analysis is negative.  b. likely physiologic secondary to chronic hypoxia (? OSA + COPD + obesity).  c. s/p plebotomy x1  . PVD (peripheral vascular disease) ([place])   . Sleep apnea    a. mod severe by sleep study 9.30.11: AHI 44.8 per hour; oxygen desat to a nadir of 65%; unsuccessful CPAP titration; significant hypoxemia even with supp o2 at 3LPM (dest to 70-80%); needs eval for cardiopulmonary disease and upper airway obstruction  . Tobacco abuse    Past Surgical History:  Procedure Laterality Date  . knee injury  1982  . [place]    reports that he has been smoking cigarettes.  He started smoking about 31 years ago. He has a 12.50 pack-year smoking history. he has never used smokeless tobacco. He reports that he drinks about 42.0 oz of alcohol per week. He reports that he uses drugs. Drug: Marijuana. Frequency: 1.00 time per week. family history includes Alcohol abuse in his father;  Asthma in his father; Drug abuse in his father; Lung cancer in his paternal grandmother; Other in his mother. No Known Allergies   Review of Systems  Constitutional: Positive for chills and fatigue.  HENT: Negative for facial swelling and sinus pain.   Respiratory: Positive for cough, shortness of breath and wheezing.   Cardiovascular: Negative for chest pain and palpitations.  Gastrointestinal: Negative for abdominal pain, nausea and vomiting.  Endocrine: Negative for polydipsia and polyuria.  Genitourinary: Negative for dysuria.  Neurological: Negative for dizziness.       Objective:   Physical Exam  Constitutional: He appears well-developed and well-nourished. No distress.  Neck: Neck supple.  Cardiovascular: Normal rate and regular rhythm.  Pulmonary/Chest: Effort normal. No respiratory distress. He has wheezes. He has no rales.  He does have some diffuse wheezes. No rales. Pulse oximetry 88% room air.  No respiratory distress  Musculoskeletal: He exhibits no edema.  Neurological: He is alert.       Assessment:     Acute exacerbation of COPD. Patient has low oxygen at baseline    Plan:     -Start doxycycline 100 mg twice daily for 10 days -Prednisone taper starting at 60 mg daily -Continue usual inhalers of Spiriva and Symbicort and use albuterol as needed -Consider over-the-counter plain Mucinex -Get back on home oxygen as needed -Follow-up immediately for any fever or increasing shortness of breath  Eulas Post MD [facility]  [place]

## 2017-03-19 NOTE — Telephone Encounter (Signed)
Spoke to [employer] with [facility]. Sheryl request sleep study be fxed showing that OSA was resolved or ruled out. Sheryl states sleep study is needed, as oxygen order was associated with OSA. Sleep study has been faxed to [facility]. Nothing further is needed.

## 2017-03-20 ENCOUNTER — Telehealth: Payer: Self-pay | Admitting: Emergency Medicine

## 2017-03-20 NOTE — Telephone Encounter (Signed)
Spoke with patient. He was asking about the status of his O2 and the equipment. Stated that [facility] had called him twice stating that they were waiting on information from our office.   Advised patient that we spoke with [facility] yesterday and a copy of his sleep study was faxed over to them and they stated nothing else was needed.   Patient verbalized understanding. Nothing else needed at time of call.

## 2017-03-29 DIAGNOSIS — J449 Chronic obstructive pulmonary disease, unspecified: Secondary | ICD-10-CM | POA: Diagnosis not present

## 2017-03-29 DIAGNOSIS — G4733 Obstructive sleep apnea (adult) (pediatric): Secondary | ICD-10-CM | POA: Diagnosis not present

## 2017-04-13 ENCOUNTER — Other Ambulatory Visit: Payer: Self-pay | Admitting: Emergency Medicine

## 2017-04-17 DIAGNOSIS — G4733 Obstructive sleep apnea (adult) (pediatric): Secondary | ICD-10-CM | POA: Diagnosis not present

## 2017-04-17 DIAGNOSIS — J449 Chronic obstructive pulmonary disease, unspecified: Secondary | ICD-10-CM | POA: Diagnosis not present

## 2017-04-26 DIAGNOSIS — G4733 Obstructive sleep apnea (adult) (pediatric): Secondary | ICD-10-CM | POA: Diagnosis not present

## 2017-04-26 DIAGNOSIS — J449 Chronic obstructive pulmonary disease, unspecified: Secondary | ICD-10-CM | POA: Diagnosis not present

## 2017-05-27 DIAGNOSIS — J449 Chronic obstructive pulmonary disease, unspecified: Secondary | ICD-10-CM | POA: Diagnosis not present

## 2017-05-27 DIAGNOSIS — G4733 Obstructive sleep apnea (adult) (pediatric): Secondary | ICD-10-CM | POA: Diagnosis not present

## 2017-06-07 ENCOUNTER — Other Ambulatory Visit: Payer: Self-pay | Admitting: Emergency Medicine

## 2017-06-26 DIAGNOSIS — G4733 Obstructive sleep apnea (adult) (pediatric): Secondary | ICD-10-CM | POA: Diagnosis not present

## 2017-06-26 DIAGNOSIS — J449 Chronic obstructive pulmonary disease, unspecified: Secondary | ICD-10-CM | POA: Diagnosis not present

## 2017-07-10 ENCOUNTER — Other Ambulatory Visit: Payer: Self-pay | Admitting: Emergency Medicine

## 2017-07-27 DIAGNOSIS — J449 Chronic obstructive pulmonary disease, unspecified: Secondary | ICD-10-CM | POA: Diagnosis not present

## 2017-07-27 DIAGNOSIS — G4733 Obstructive sleep apnea (adult) (pediatric): Secondary | ICD-10-CM | POA: Diagnosis not present

## 2017-07-31 ENCOUNTER — Ambulatory Visit (INDEPENDENT_AMBULATORY_CARE_PROVIDER_SITE_OTHER): Payer: PPO | Admitting: Adult Health

## 2017-07-31 ENCOUNTER — Encounter: Payer: Self-pay | Admitting: Adult Health

## 2017-07-31 VITALS — BP 136/72 | Temp 98.1°F | Ht 72.75 in | Wt 325.0 lb

## 2017-07-31 DIAGNOSIS — G4733 Obstructive sleep apnea (adult) (pediatric): Secondary | ICD-10-CM

## 2017-07-31 DIAGNOSIS — Z0000 Encounter for general adult medical examination without abnormal findings: Secondary | ICD-10-CM | POA: Diagnosis not present

## 2017-07-31 DIAGNOSIS — I739 Peripheral vascular disease, unspecified: Secondary | ICD-10-CM

## 2017-07-31 DIAGNOSIS — Z114 Encounter for screening for human immunodeficiency virus [HIV]: Secondary | ICD-10-CM

## 2017-07-31 DIAGNOSIS — Z125 Encounter for screening for malignant neoplasm of prostate: Secondary | ICD-10-CM

## 2017-07-31 DIAGNOSIS — J449 Chronic obstructive pulmonary disease, unspecified: Secondary | ICD-10-CM | POA: Diagnosis not present

## 2017-07-31 DIAGNOSIS — Z6841 Body Mass Index (BMI) 40.0 and over, adult: Secondary | ICD-10-CM

## 2017-07-31 DIAGNOSIS — Z1159 Encounter for screening for other viral diseases: Secondary | ICD-10-CM | POA: Diagnosis not present

## 2017-07-31 DIAGNOSIS — F1010 Alcohol abuse, uncomplicated: Secondary | ICD-10-CM | POA: Diagnosis not present

## 2017-07-31 DIAGNOSIS — F17200 Nicotine dependence, unspecified, uncomplicated: Secondary | ICD-10-CM | POA: Diagnosis not present

## 2017-07-31 LAB — CBC WITH DIFFERENTIAL/PLATELET
BASOS ABS: 0.1 10*3/uL (ref 0.0–0.1)
Basophils Relative: 1.1 % (ref 0.0–3.0)
Eosinophils Absolute: 0.2 10*3/uL (ref 0.0–0.7)
Eosinophils Relative: 2.6 % (ref 0.0–5.0)
LYMPHS PCT: 19.7 % (ref 12.0–46.0)
Lymphs Abs: 1.3 10*3/uL (ref 0.7–4.0)
MCHC: 34 g/dL (ref 30.0–36.0)
MCV: 107.8 fl — AB (ref 78.0–100.0)
Monocytes Absolute: 0.4 10*3/uL (ref 0.1–1.0)
Monocytes Relative: 6.4 % (ref 3.0–12.0)
Neutro Abs: 4.6 10*3/uL (ref 1.4–7.7)
Neutrophils Relative %: 70.2 % (ref 43.0–77.0)
Platelets: 137 10*3/uL — ABNORMAL LOW (ref 150.0–400.0)
RBC: 5.32 Mil/uL (ref 4.22–5.81)
RDW: 14.2 % (ref 11.5–15.5)
WBC: 6.6 10*3/uL (ref 4.0–10.5)

## 2017-07-31 LAB — HEPATIC FUNCTION PANEL
ALT: 29 U/L (ref 0–53)
AST: 22 U/L (ref 0–37)
Albumin: 4.3 g/dL (ref 3.5–5.2)
Alkaline Phosphatase: 68 U/L (ref 39–117)
BILIRUBIN DIRECT: 0.2 mg/dL (ref 0.0–0.3)
BILIRUBIN TOTAL: 0.8 mg/dL (ref 0.2–1.2)
TOTAL PROTEIN: 6.4 g/dL (ref 6.0–8.3)

## 2017-07-31 LAB — HEMOGLOBIN A1C: HEMOGLOBIN A1C: 5.6 % (ref 4.6–6.5)

## 2017-07-31 LAB — BASIC METABOLIC PANEL
BUN: 9 mg/dL (ref 6–23)
CALCIUM: 9.7 mg/dL (ref 8.4–10.5)
CO2: 35 mEq/L — ABNORMAL HIGH (ref 19–32)
Chloride: 100 mEq/L (ref 96–112)
Creatinine, Ser: 0.92 mg/dL (ref 0.40–1.50)
GFR: 93.34 mL/min (ref >60.00)
Glucose, Bld: 90 mg/dL (ref 70–99)
Potassium: 4.4 mEq/L (ref 3.5–5.1)
Sodium: 142 mEq/L (ref 135–145)

## 2017-07-31 LAB — LIPID PANEL
CHOL/HDL RATIO: 4
Cholesterol: 164 mg/dL (ref 0–200)
HDL: 40.6 mg/dL (ref >39.00)
LDL CALC: 103 mg/dL — AB (ref 0–99)
NonHDL: 123.81
TRIGLYCERIDES: 106 mg/dL (ref 0.0–149.0)
VLDL: 21.2 mg/dL (ref 0.0–40.0)

## 2017-07-31 LAB — VITAMIN B12: Vitamin B-12: 140 pg/mL — ABNORMAL LOW (ref 211–911)

## 2017-07-31 LAB — TSH: TSH: 1.12 u[IU]/mL (ref 0.35–4.50)

## 2017-07-31 LAB — FOLATE: Folate: 8.2 ng/mL (ref >5.9)

## 2017-07-31 LAB — PSA: PSA: 0.66 ng/mL (ref 0.10–4.00)

## 2017-07-31 NOTE — Progress Notes (Signed)
Subjective:    Patient ID: Billy [name], male    DOB: [birth year], 47 y.o.   MRN: [id number]  HPI  Patient presents for yearly preventative medicine examination. He is a pleasant 47 year old male who  has a past medical history of COPD (chronic obstructive pulmonary disease) ([place]), History of bronchitis, Obesity, Polycythemia, PVD (peripheral vascular disease) ([place]), Sleep apnea, and Tobacco abuse.  COPD/chronic respiratory failure. Is on 2 L O2 via [place] with exertion and at bedtime. He is followed up pulmonary. Currently prescribed Albuterol, Spirivia, and Symbicort   Sleep Apnea - He uses CPAP  Alcohol Abuse - Drinks about 10 beers a day. He understands that this is excessive and that he needs to cut back on the amount he drinks.   Tobacco Use - Smokes about 0.5 . He understands that he needs to quit. He has used Wellbutrin in the past but did not like the way it made him feel.   All immunizations and health maintenance protocols were reviewed with the patient and needed orders were placed.  Appropriate screening laboratory values were ordered for the patient including screening of hyperlipidemia, renal function and hepatic function. If indicated by BPH, a PSA was ordered.  Medication reconciliation,  past medical history, social history, problem list and allergies were reviewed in detail with the patient  Goals were established with regard to weight loss, exercise, and  diet in compliance with medications. He would like to lose weight but does not want to go on medications or have surgery. He drinks most of his calories.   Wt Readings from Last 3 Encounters:  07/31/17 (!) 325 lb (147.4 kg)  03/19/17 (!) 319 lb (144.7 kg)  03/07/17 (!) 330 lb (149.7 kg)    Review of Systems  Constitutional: Negative.   HENT: Negative.   Eyes: Negative.   Respiratory: Positive for cough, shortness of breath and wheezing.   Cardiovascular: Positive for leg swelling.  Gastrointestinal:  Negative.   Endocrine: Negative.   Genitourinary: Negative.   Musculoskeletal: Positive for arthralgias and back pain.  Skin: Positive for color change (lower extremities ).  Allergic/Immunologic: Negative.   Neurological: Negative.   Hematological: Negative.   Psychiatric/Behavioral: Negative.   All other systems reviewed and are negative.  Past Medical History:  Diagnosis Date  . COPD (chronic obstructive pulmonary disease) ([place])   . History of bronchitis   . Obesity   . Polycythemia    a. Dr. Marin Olp.Marland KitchenMarland KitchenJAK-2 analysis is negative.  b. likely physiologic secondary to chronic hypoxia (? OSA + COPD + obesity).  c. s/p plebotomy x1  . PVD (peripheral vascular disease) ([place])   . Sleep apnea    a. mod severe by sleep study 9.30.11: AHI 44.8 per hour; oxygen desat to a nadir of 65%; unsuccessful CPAP titration; significant hypoxemia even with supp o2 at 3LPM (dest to 70-80%); needs eval for cardiopulmonary disease and upper airway obstruction  . Tobacco abuse     Social History   Socioeconomic History  . Marital status: Divorced    Spouse name: Not on file  . Number of children: Not on file  . Years of education: Not on file  . Highest education level: Not on file  Occupational History  . Occupation: unemployed  Social Needs  . Financial resource strain: Not on file  . Food insecurity:    Worry: Not on file    Inability: Not on file  . Transportation needs:    Medical: Not on file  Non-medical: Not on file  Tobacco Use  . Smoking status: Current Every Day Smoker    Packs/day: 0.50    Years: 25.00    Pack years: 12.50    Types: Cigarettes    Start date: 02/08/1986  . Smokeless tobacco: Never Used  . Tobacco comment: 06-08-14  still smoking  Substance and Sexual Activity  . Alcohol use: Yes    Alcohol/week: 42.0 oz    Types: 70 Cans of beer per week  . Drug use: Yes    Frequency: 1.0 times per week    Types: Marijuana    Comment: marajuana...also valium not  prescribed  . Sexual activity: Not on file  Lifestyle  . Physical activity:    Days per week: Not on file    Minutes per session: Not on file  . Stress: Not on file  Relationships  . Social connections:    Talks on phone: Not on file    Gets together: Not on file    Attends religious service: Not on file    Active member of club or organization: Not on file    Attends meetings of clubs or organizations: Not on file    Relationship status: Not on file  . Intimate partner violence:    Fear of current or ex partner: Not on file    Emotionally abused: Not on file    Physically abused: Not on file    Forced sexual activity: Not on file  Other Topics Concern  . Not on file  Social History Narrative  . Not on file    Past Surgical History:  Procedure Laterality Date  . knee injury  1982  . TONSILLECTOMY AND ADENOIDECTOMY  1972    Family History  Problem Relation Age of Onset  . Other Mother        Wegener's Disease  . Lung cancer Paternal Grandmother   . Drug abuse Father   . Alcohol abuse Father   . Asthma Father     No Known Allergies  Current Outpatient Medications on File Prior to Visit  Medication Sig Dispense Refill  . albuterol (PROAIR HFA) 108 (90 Base) MCG/ACT inhaler INHALE 2 PUFFS INTO THE LUNGS EVERY 6 HOURS AS NEEDED FOR SHORTNESS OF BREATH 8.5 Inhaler 5  . albuterol (PROAIR HFA) 108 (90 Base) MCG/ACT inhaler INHALE 2 PUFFS INTO THE LUNGS EVERY 6 HOURS AS NEEDED FOR SHORTNESS OF BREATH 8.5 Inhaler 5  . aspirin 81 MG tablet Take 81 mg by mouth daily.      Marland Kitchen doxycycline (VIBRAMYCIN) 100 MG capsule Take 1 capsule (100 mg total) by mouth 2 (two) times daily. 20 capsule 0  . predniSONE (DELTASONE) 10 MG tablet Taper as follows 6-6-4-4-3-3-2-2 30 tablet 0  . PROAIR HFA 108 (90 Base) MCG/ACT inhaler INHALE 2 PUFFS INTO THE LUNGS EVERY 6 HOURS AS NEEDED FOR SHORTNESS OF BREATH 8.5 Inhaler 2  . SPIRIVA HANDIHALER 18 MCG inhalation capsule PLACE 1 CAPSULE INTO INHALER  AND INHALE DAILY 30 capsule 2  . SYMBICORT 160-4.5 MCG/ACT inhaler INHALE 2 PUFFS INTO THE LUNGS 2 (TWO) TIMES DAILY. 10.2 Inhaler 5  . [DISCONTINUED] sildenafil (VIAGRA) 50 MG tablet Take 1 tablet (50 mg total) by mouth daily as needed for erectile dysfunction. 10 tablet 0   No current facility-administered medications on file prior to visit.     BP 136/72   Temp 98.1 F (36.7 C) (Oral)   Ht 6' 0.75" (1.848 m)   Wt (!) 325 lb (147.4 kg)  BMI 43.17 kg/m       Objective:   Physical Exam  Constitutional: He is oriented to person, place, and time. He appears well-developed and well-nourished. No distress.  Obese    HENT:  Head: Normocephalic and atraumatic.  Right Ear: External ear normal.  Left Ear: External ear normal.  Nose: Nose normal.  Mouth/Throat: Oropharynx is clear and moist. No oropharyngeal exudate.  Eyes: Pupils are equal, round, and reactive to light. Conjunctivae and EOM are normal. Right eye exhibits no discharge. Left eye exhibits no discharge. No scleral icterus.  Neck: Normal range of motion. Neck supple. No JVD present. No tracheal deviation present. No thyromegaly present.  Cardiovascular: Normal rate, regular rhythm, normal heart sounds and intact distal pulses. Exam reveals no gallop and no friction rub.  No murmur heard. Pulmonary/Chest: Effort normal. No stridor. No respiratory distress. He has wheezes (trace throughout ). He has no rales. He exhibits no tenderness.  Abdominal: Soft. Bowel sounds are normal. He exhibits no distension and no mass. There is no tenderness. There is no rebound and no guarding. No hernia.  Genitourinary:  Genitourinary Comments: Will do PSA    Musculoskeletal: Normal range of motion. He exhibits edema (+ 1 pitting edema in bilateral lower extremities ). He exhibits no tenderness or deformity.  Lymphadenopathy:    He has no cervical adenopathy.  Neurological: He is alert and oriented to person, place, and time. He displays  normal reflexes. No cranial nerve deficit or sensory deficit. He exhibits normal muscle tone. Coordination normal.  Skin: Skin is warm and dry. Capillary refill takes less than 2 seconds. No rash noted. He is not diaphoretic. No erythema. No pallor.  Hyperpigmentation to lower extremities from PVD   Psychiatric: He has a normal mood and affect. His behavior is normal. Judgment and thought content normal.  Nursing note and vitals reviewed.     Assessment & Plan:  1. Routine general medical examination at a health care facility - Basic metabolic panel - CBC with Differential/Platelet - Hepatic function panel - Lipid panel - TSH - Hemoglobin A1c  2. Class 3 severe obesity due to excess calories with serious comorbidity and body mass index (BMI) of 40.0 to 44.9 in adult [facility]) - We spoke at length about weight loss. He is interested in weight loss but understands that he needs to make positive changes for this to happen. He is getting greater than half of his calories from alcohol. He needs to cut back. Diet is poor, we reviewed healthy meal options. He is not interested in any additional help at this time.  - Basic metabolic panel - CBC with Differential/Platelet - Hepatic function panel - Lipid panel - TSH - Hemoglobin A1c  3. TOBACCO ABUSE - Encouraged to quit smoking  - does not want any assistance   4. Obstructive sleep apnea - Continue with CPAP and oxygen therapy   5. PERIPHERAL VASCULAR DISEASE - Continue with Lasix.  - Basic metabolic panel - CBC with Differential/Platelet - Hepatic function panel - Lipid panel - TSH - Hemoglobin A1c  6. Chronic obstructive pulmonary disease, unspecified COPD type ([place]) - Continue with Pulmonary POC  - Basic metabolic panel - CBC with Differential/Platelet - Hepatic function panel - Lipid panel - TSH - Hemoglobin A1c  7. Encounter for screening for HIV  - HIV antibody  8. Need for hepatitis C screening test  - Hep C  Antibody  9. Prostate cancer screening  - PSA  10. Alcohol abuse -  encouraged to cut back on alcohol abuse  - Vitamin B12 - Folate  Dorothyann Peng, NP

## 2017-08-01 ENCOUNTER — Encounter: Payer: Self-pay | Admitting: Family Medicine

## 2017-08-06 NOTE — Progress Notes (Signed)
[id number]

## 2017-08-08 LAB — HEPATITIS C ANTIBODY
Hepatitis C Ab: NONREACTIVE
SIGNAL TO CUT-OFF: 0.03 (ref <1.00)

## 2017-08-08 LAB — HIV ANTIBODY (ROUTINE TESTING W REFLEX)

## 2017-08-09 ENCOUNTER — Other Ambulatory Visit: Payer: Self-pay | Admitting: Emergency Medicine

## 2017-08-10 ENCOUNTER — Ambulatory Visit: Payer: PPO | Admitting: Podiatry

## 2017-08-10 ENCOUNTER — Encounter: Payer: Self-pay | Admitting: Podiatry

## 2017-08-10 ENCOUNTER — Ambulatory Visit (INDEPENDENT_AMBULATORY_CARE_PROVIDER_SITE_OTHER): Payer: PPO

## 2017-08-10 ENCOUNTER — Other Ambulatory Visit: Payer: Self-pay | Admitting: Podiatry

## 2017-08-10 VITALS — BP 140/93 | HR 95

## 2017-08-10 DIAGNOSIS — L03115 Cellulitis of right lower limb: Secondary | ICD-10-CM

## 2017-08-10 DIAGNOSIS — M79671 Pain in right foot: Secondary | ICD-10-CM

## 2017-08-10 DIAGNOSIS — M7661 Achilles tendinitis, right leg: Secondary | ICD-10-CM

## 2017-08-10 MED ORDER — AMOXICILLIN-POT CLAVULANATE 875-125 MG PO TABS: 1.0000 | ORAL_TABLET | Freq: Two times a day (BID) | ORAL | 0 refills | Status: AC

## 2017-08-10 MED ORDER — IBUPROFEN 800 MG PO TABS: 800.0000 mg | ORAL_TABLET | Freq: Three times a day (TID) | ORAL | 0 refills | Status: DC | PRN

## 2017-08-11 ENCOUNTER — Encounter: Payer: Self-pay | Admitting: Podiatry

## 2017-08-11 NOTE — Progress Notes (Signed)
Subjective: [name] presents to clinic today complaining of right foot pain, more specifically, Achilles pain. He denies any trauma to area. States he was helping a friend with some work around his friend's house. Admits to going up and down stairs several times. Noticed pain in Achilles near insertion to heel after that. Area extremely painful. Also admits to chronic swelling of legs right > left. Says he has taken a muscle relaxer given to him by a friend. Also has taken pain pill from a friend. He denies any calf pain and states pain is localized to right heel near Achilles insertion. [name] also states he wears compression hose for venous insufficiency.  Objective: Physical Exam General: The patient is alert and oriented x3 in no acute distress.  Dermatology:   Skin is warm, dry and supple bilateral lower extremities. Negative for open lesions or macerations.  Hemosiderin deposits noted to the bilateral lower extremities consistent with a likely venous insufficiency  Vascular:  Capillary refill within normal limits. Palpable pedal pulses bilaterally.  Right lower leg noted to be erythematous and warm with edema when compared to left LE.   Neurological:  Epicritic and protective threshold grossly intact bilaterally.   Musculoskeletal Exam: Pain on palpation right achilles at insertion to heel.  Negative Homan's sign RLE  Range of motion within normal limits to all pedal and ankle joints left LE.  Xrays right LE negative for  fx or bone destruction. Mild soft tissue edema noted posterior calcaneus suspicicous for bursitis.  Assessment: 1. Cellulitis RLE 2. Achilles tendinitis/bursitis RLE  Plan: 1. LE examination performed 2. Discussed treatment plan with [name]. He should limit activity for now. Resume prescribed compression hose.  3. Prescription for Augmentin 875 mg; 1 capsule bid x 7 days. 4. Prescription for ibuprofen 800 mg po tid x 10 days. Patient  instructed to take after meals. 5. If right extremity pain increases or redness worsens, patient instructed to report to ER. 6. Obtain preauth for MRI right foot/leg should condition not improve with dx of achilles tendonitis/bursitis 7. Obtain preauth for walking boot with dx of achilles tendonitis/bursitis. 8. Follow up 1 week.

## 2017-08-13 ENCOUNTER — Encounter

## 2017-08-13 ENCOUNTER — Telehealth: Payer: Self-pay | Admitting: *Deleted

## 2017-08-13 ENCOUNTER — Ambulatory Visit: Payer: PPO | Admitting: Podiatry

## 2017-08-13 DIAGNOSIS — M7661 Achilles tendinitis, right leg: Secondary | ICD-10-CM

## 2017-08-13 DIAGNOSIS — M79671 Pain in right foot: Secondary | ICD-10-CM

## 2017-08-13 NOTE — Telephone Encounter (Signed)
-----   Message from [place], [place] sent at 08/10/2017 12:01 PM EDT ----- Regarding: MRI Dr. Consuelo Pandy;  MRI needed  Right leg lower extremity Pt has poor circulation in both legs

## 2017-08-13 NOTE — Telephone Encounter (Signed)
Orders for MRI to [facility].

## 2017-08-17 ENCOUNTER — Encounter: Payer: Self-pay | Admitting: Podiatry

## 2017-08-17 ENCOUNTER — Ambulatory Visit (INDEPENDENT_AMBULATORY_CARE_PROVIDER_SITE_OTHER): Payer: PPO | Admitting: Podiatry

## 2017-08-17 ENCOUNTER — Ambulatory Visit: Payer: PPO | Admitting: Podiatry

## 2017-08-17 DIAGNOSIS — L03115 Cellulitis of right lower limb: Secondary | ICD-10-CM | POA: Diagnosis not present

## 2017-08-17 DIAGNOSIS — M7661 Achilles tendinitis, right leg: Secondary | ICD-10-CM

## 2017-08-21 ENCOUNTER — Other Ambulatory Visit: Payer: Self-pay | Admitting: Emergency Medicine

## 2017-08-21 NOTE — Progress Notes (Signed)
Subjective: Mr. Brinegar presents to clinic today for follow up of Achilles tendonitis and cellulitis of right lower leg.  He states he received a call for scheduling his MRI, but he wanted to see me first because his symptoms have resolved and he did not feel the MRI was necessary at this point.  He states his right lower leg and Achilles both started to improve the next day after taking doses of Augmentin and Ibuprofen. His symptoms have completely resolved.    He is back to wearing his compression hose and Skechers on a daily basis and performing his normal routine activities with no limitations.  Objective: Physical Exam General: The patient is alert and oriented x 3 in no acute distress.  Dermatology:   Skin is warm, dry and supple bilateral lower extremities. Negative for open lesions or macerations. Hemosiderin deposits noted to the bilateral lower extremities consistent with venous insufficiency  Vascular:  Capillary refill within normal limits. Palpable pedal pulses bilaterally.  Right lower leg with resolved erythema and resolved warmth.   Neurological:  Epicritic and protective threshold grossly intact bilaterally.   Musculoskeletal Exam: No pain elicited on palpation to right achilles at insertion to heel.   Negative Homan's sign RLE  Range of motion within normal limits to all pedal and ankle joints left LE.  Assessment: 1. Cellulitis RLE:  resolved  2. Achilles tendinitis/bursitis RLE: resolved  Plan: 1. LE examination performed 2. Resume normal daily activity 3. Resume prescribed compression hose.  3. He may continue to take ibuprofen 800 mg po tid pc until all gone. 5. If symptoms return with Achilles, consider MRI. Will defer for now since his symptoms have completely resolved. 6. Follow up as needed.

## 2017-08-26 DIAGNOSIS — J449 Chronic obstructive pulmonary disease, unspecified: Secondary | ICD-10-CM | POA: Diagnosis not present

## 2017-08-26 DIAGNOSIS — G4733 Obstructive sleep apnea (adult) (pediatric): Secondary | ICD-10-CM | POA: Diagnosis not present

## 2017-09-26 DIAGNOSIS — G4733 Obstructive sleep apnea (adult) (pediatric): Secondary | ICD-10-CM | POA: Diagnosis not present

## 2017-09-26 DIAGNOSIS — J449 Chronic obstructive pulmonary disease, unspecified: Secondary | ICD-10-CM | POA: Diagnosis not present

## 2017-10-09 ENCOUNTER — Ambulatory Visit: Payer: PPO | Admitting: Family Medicine

## 2017-10-11 ENCOUNTER — Other Ambulatory Visit: Payer: Self-pay | Admitting: Emergency Medicine

## 2017-10-20 ENCOUNTER — Telehealth: Payer: Self-pay | Admitting: Pulmonary Disease

## 2017-10-20 DIAGNOSIS — G4733 Obstructive sleep apnea (adult) (pediatric): Secondary | ICD-10-CM

## 2017-10-20 NOTE — Telephone Encounter (Signed)
Patient called in for a dysfunctional CPAP  Contacted [facility]  Order for CPAP was faxed to [phone]

## 2017-10-22 ENCOUNTER — Telehealth: Payer: Self-pay | Admitting: Emergency Medicine

## 2017-10-22 DIAGNOSIS — G4733 Obstructive sleep apnea (adult) (pediatric): Secondary | ICD-10-CM

## 2017-10-22 NOTE — Telephone Encounter (Signed)
Called and spoke with the patient [facility] needed a new order for his CPAP machine I have placed this order and called patient to let him know this complete.

## 2017-10-22 NOTE — Telephone Encounter (Signed)
Called and spoke with patient regarding new placement of cpap machine with OL Pt advised he spoke with [facility]; they are waiting to hear back from insurance Pt is calling insurance today to see what the hold up is, then call [facility] back Advised pt that referral was place to [facility] 10/19/17 Nothing further needed.

## 2017-10-25 DIAGNOSIS — G4733 Obstructive sleep apnea (adult) (pediatric): Secondary | ICD-10-CM | POA: Diagnosis not present

## 2017-10-27 DIAGNOSIS — J449 Chronic obstructive pulmonary disease, unspecified: Secondary | ICD-10-CM | POA: Diagnosis not present

## 2017-10-27 DIAGNOSIS — G4733 Obstructive sleep apnea (adult) (pediatric): Secondary | ICD-10-CM | POA: Diagnosis not present

## 2017-11-25 DIAGNOSIS — G4733 Obstructive sleep apnea (adult) (pediatric): Secondary | ICD-10-CM | POA: Diagnosis not present

## 2017-11-26 DIAGNOSIS — J449 Chronic obstructive pulmonary disease, unspecified: Secondary | ICD-10-CM | POA: Diagnosis not present

## 2017-11-26 DIAGNOSIS — G4733 Obstructive sleep apnea (adult) (pediatric): Secondary | ICD-10-CM | POA: Diagnosis not present

## 2017-12-25 DIAGNOSIS — G4733 Obstructive sleep apnea (adult) (pediatric): Secondary | ICD-10-CM | POA: Diagnosis not present

## 2017-12-27 DIAGNOSIS — G4733 Obstructive sleep apnea (adult) (pediatric): Secondary | ICD-10-CM | POA: Diagnosis not present

## 2017-12-27 DIAGNOSIS — J449 Chronic obstructive pulmonary disease, unspecified: Secondary | ICD-10-CM | POA: Diagnosis not present

## 2018-01-04 ENCOUNTER — Other Ambulatory Visit: Payer: Self-pay | Admitting: Emergency Medicine

## 2018-01-25 DIAGNOSIS — G4733 Obstructive sleep apnea (adult) (pediatric): Secondary | ICD-10-CM | POA: Diagnosis not present

## 2018-01-26 DIAGNOSIS — G4733 Obstructive sleep apnea (adult) (pediatric): Secondary | ICD-10-CM | POA: Diagnosis not present

## 2018-01-26 DIAGNOSIS — J449 Chronic obstructive pulmonary disease, unspecified: Secondary | ICD-10-CM | POA: Diagnosis not present

## 2018-02-04 ENCOUNTER — Other Ambulatory Visit: Payer: Self-pay | Admitting: *Deleted

## 2018-02-04 MED ORDER — BUDESONIDE-FORMOTEROL FUMARATE 160-4.5 MCG/ACT IN AERO: 2.0000 | INHALATION_SPRAY | Freq: Two times a day (BID) | RESPIRATORY_TRACT | 0 refills | Status: DC

## 2018-02-24 DIAGNOSIS — G4733 Obstructive sleep apnea (adult) (pediatric): Secondary | ICD-10-CM | POA: Diagnosis not present

## 2018-02-26 DIAGNOSIS — J449 Chronic obstructive pulmonary disease, unspecified: Secondary | ICD-10-CM | POA: Diagnosis not present

## 2018-02-26 DIAGNOSIS — G4733 Obstructive sleep apnea (adult) (pediatric): Secondary | ICD-10-CM | POA: Diagnosis not present

## 2018-02-27 DIAGNOSIS — J449 Chronic obstructive pulmonary disease, unspecified: Secondary | ICD-10-CM | POA: Diagnosis not present

## 2018-02-27 DIAGNOSIS — G4733 Obstructive sleep apnea (adult) (pediatric): Secondary | ICD-10-CM | POA: Diagnosis not present

## 2018-03-20 ENCOUNTER — Other Ambulatory Visit: Payer: Self-pay | Admitting: Emergency Medicine

## 2018-03-20 ENCOUNTER — Telehealth: Payer: Self-pay | Admitting: Emergency Medicine

## 2018-03-20 MED ORDER — BUDESONIDE-FORMOTEROL FUMARATE 160-4.5 MCG/ACT IN AERO: 2.0000 | INHALATION_SPRAY | Freq: Two times a day (BID) | RESPIRATORY_TRACT | 0 refills | Status: DC

## 2018-03-20 MED ORDER — TIOTROPIUM BROMIDE MONOHYDRATE 18 MCG IN CAPS: ORAL_CAPSULE | RESPIRATORY_TRACT | 0 refills | Status: DC

## 2018-03-20 MED ORDER — ALBUTEROL SULFATE HFA 108 (90 BASE) MCG/ACT IN AERS: INHALATION_SPRAY | RESPIRATORY_TRACT | 0 refills | Status: DC

## 2018-03-20 NOTE — Telephone Encounter (Signed)
Called and spoke with patient, he stated that his pharmacy denied his refills. Advised patient that it has been over a year since he was seen and we will need to get him in for an appointment. Patient has been scheduled to see RB on 03/22/2018 and refills sent in to get him by. Advised patient he will need to come in the for appointment for further refills. Patient verbalized understanding. Patient is aware of new location and number. Nothing further needed.

## 2018-03-22 ENCOUNTER — Ambulatory Visit: Payer: PPO | Admitting: Emergency Medicine

## 2018-03-22 ENCOUNTER — Encounter (HOSPITAL_COMMUNITY): Payer: Self-pay | Admitting: Emergency Medicine

## 2018-03-22 ENCOUNTER — Other Ambulatory Visit: Payer: Self-pay

## 2018-03-22 ENCOUNTER — Inpatient Hospital Stay (HOSPITAL_COMMUNITY)
Admission: EM | Admit: 2018-03-22 | Discharge: 2018-03-27 | DRG: 189 | Disposition: A | Discharge status: Home Health | Payer: PPO | Attending: Internal Medicine | Admitting: Internal Medicine

## 2018-03-22 ENCOUNTER — Emergency Department (HOSPITAL_COMMUNITY): Payer: PPO

## 2018-03-22 DIAGNOSIS — E872 Acidosis: Secondary | ICD-10-CM | POA: Diagnosis present

## 2018-03-22 DIAGNOSIS — G4733 Obstructive sleep apnea (adult) (pediatric): Secondary | ICD-10-CM | POA: Diagnosis not present

## 2018-03-22 DIAGNOSIS — J9622 Acute and chronic respiratory failure with hypercapnia: Secondary | ICD-10-CM | POA: Diagnosis not present

## 2018-03-22 DIAGNOSIS — R0602 Shortness of breath: Secondary | ICD-10-CM | POA: Diagnosis not present

## 2018-03-22 DIAGNOSIS — J9621 Acute and chronic respiratory failure with hypoxia: Principal | ICD-10-CM | POA: Diagnosis present

## 2018-03-22 DIAGNOSIS — F17210 Nicotine dependence, cigarettes, uncomplicated: Secondary | ICD-10-CM | POA: Diagnosis not present

## 2018-03-22 DIAGNOSIS — F1210 Cannabis abuse, uncomplicated: Secondary | ICD-10-CM | POA: Diagnosis not present

## 2018-03-22 DIAGNOSIS — I739 Peripheral vascular disease, unspecified: Secondary | ICD-10-CM | POA: Diagnosis not present

## 2018-03-22 DIAGNOSIS — F1010 Alcohol abuse, uncomplicated: Secondary | ICD-10-CM | POA: Diagnosis not present

## 2018-03-22 DIAGNOSIS — D751 Secondary polycythemia: Secondary | ICD-10-CM | POA: Diagnosis not present

## 2018-03-22 DIAGNOSIS — F17200 Nicotine dependence, unspecified, uncomplicated: Secondary | ICD-10-CM

## 2018-03-22 DIAGNOSIS — J9691 Respiratory failure, unspecified with hypoxia: Secondary | ICD-10-CM | POA: Diagnosis not present

## 2018-03-22 DIAGNOSIS — R000 Tachycardia, unspecified: Secondary | ICD-10-CM | POA: Diagnosis not present

## 2018-03-22 DIAGNOSIS — F10239 Alcohol dependence with withdrawal, unspecified: Secondary | ICD-10-CM | POA: Diagnosis present

## 2018-03-22 DIAGNOSIS — J441 Chronic obstructive pulmonary disease with (acute) exacerbation: Secondary | ICD-10-CM | POA: Diagnosis not present

## 2018-03-22 DIAGNOSIS — R0902 Hypoxemia: Secondary | ICD-10-CM | POA: Diagnosis not present

## 2018-03-22 DIAGNOSIS — J811 Chronic pulmonary edema: Secondary | ICD-10-CM | POA: Diagnosis not present

## 2018-03-22 DIAGNOSIS — Z6841 Body Mass Index (BMI) 40.0 and over, adult: Secondary | ICD-10-CM | POA: Diagnosis not present

## 2018-03-22 DIAGNOSIS — J9601 Acute respiratory failure with hypoxia: Secondary | ICD-10-CM | POA: Diagnosis present

## 2018-03-22 DIAGNOSIS — Z9981 Dependence on supplemental oxygen: Secondary | ICD-10-CM | POA: Diagnosis not present

## 2018-03-22 DIAGNOSIS — J9602 Acute respiratory failure with hypercapnia: Secondary | ICD-10-CM | POA: Diagnosis not present

## 2018-03-22 DIAGNOSIS — E662 Morbid (severe) obesity with alveolar hypoventilation: Secondary | ICD-10-CM | POA: Diagnosis not present

## 2018-03-22 DIAGNOSIS — Z7982 Long term (current) use of aspirin: Secondary | ICD-10-CM | POA: Diagnosis not present

## 2018-03-22 DIAGNOSIS — J9600 Acute respiratory failure, unspecified whether with hypoxia or hypercapnia: Secondary | ICD-10-CM

## 2018-03-22 DIAGNOSIS — Z7951 Long term (current) use of inhaled steroids: Secondary | ICD-10-CM

## 2018-03-22 DIAGNOSIS — J449 Chronic obstructive pulmonary disease, unspecified: Secondary | ICD-10-CM | POA: Diagnosis present

## 2018-03-22 DIAGNOSIS — I5033 Acute on chronic diastolic (congestive) heart failure: Secondary | ICD-10-CM | POA: Diagnosis not present

## 2018-03-22 DIAGNOSIS — J810 Acute pulmonary edema: Secondary | ICD-10-CM

## 2018-03-22 LAB — CBC
HCT: 64.5 % — ABNORMAL HIGH (ref 39.0–52.0)
Hemoglobin: 21.4 g/dL (ref 13.0–17.0)
MCH: 36.4 pg — AB (ref 26.0–34.0)
MCHC: 33.2 g/dL (ref 30.0–36.0)
MCV: 109.7 fL — ABNORMAL HIGH (ref 80.0–100.0)
Platelets: 146 10*3/uL — ABNORMAL LOW (ref 150–400)
RBC: 5.88 MIL/uL — ABNORMAL HIGH (ref 4.22–5.81)
RDW: 14.3 % (ref 11.5–15.5)
WBC: 7 10*3/uL (ref 4.0–10.5)
nRBC: 0 % (ref 0.0–0.2)

## 2018-03-22 LAB — IRON AND TIBC
IRON: 20 ug/dL — AB (ref 45–182)
Saturation Ratios: 6 % — ABNORMAL LOW (ref 17.9–39.5)
TIBC: 349 ug/dL (ref 250–450)
UIBC: 329 ug/dL

## 2018-03-22 LAB — GLUCOSE, CAPILLARY
GLUCOSE-CAPILLARY: 174 mg/dL — AB (ref 70–99)
Glucose-Capillary: 159 mg/dL — ABNORMAL HIGH (ref 70–99)

## 2018-03-22 LAB — I-STAT CREATININE, ED: Creatinine, Ser: 1 mg/dL (ref 0.61–1.24)

## 2018-03-22 LAB — I-STAT TROPONIN, ED: Troponin i, poc: 0.01 ng/mL (ref 0.00–0.08)

## 2018-03-22 LAB — MRSA PCR SCREENING: MRSA by PCR: NEGATIVE

## 2018-03-22 LAB — BASIC METABOLIC PANEL
Anion gap: 12 (ref 5–15)
BUN: 10 mg/dL (ref 6–20)
CO2: 28 mmol/L (ref 22–32)
Calcium: 9.1 mg/dL (ref 8.9–10.3)
Chloride: 94 mmol/L — ABNORMAL LOW (ref 98–111)
Creatinine, Ser: 1.02 mg/dL (ref 0.61–1.24)
GFR calc Af Amer: >60 mL/min (ref >60)
GLUCOSE: 126 mg/dL — AB (ref 70–99)
Potassium: 4.7 mmol/L (ref 3.5–5.1)
Sodium: 134 mmol/L — ABNORMAL LOW (ref 135–145)

## 2018-03-22 LAB — FERRITIN: FERRITIN: 224 ng/mL (ref 24–336)

## 2018-03-22 LAB — AMMONIA: Ammonia: 43 umol/L — ABNORMAL HIGH (ref 9–35)

## 2018-03-22 LAB — BRAIN NATRIURETIC PEPTIDE: B Natriuretic Peptide: 39.4 pg/mL (ref 0.0–100.0)

## 2018-03-22 MED ORDER — IPRATROPIUM-ALBUTEROL 0.5-2.5 (3) MG/3ML IN SOLN
3.0000 mL | Freq: Four times a day (QID) | RESPIRATORY_TRACT | Status: DC
  Administered 2018-03-22: 3 mL via RESPIRATORY_TRACT
  Filled 2018-03-22: qty 3

## 2018-03-22 MED ORDER — DIPHENHYDRAMINE HCL 50 MG/ML IJ SOLN
25.0000 mg | Freq: Once | INTRAMUSCULAR | Status: AC
  Administered 2018-03-22: 25 mg via INTRAVENOUS
  Filled 2018-03-22: qty 0.5

## 2018-03-22 MED ORDER — ADULT MULTIVITAMIN W/MINERALS CH
1.0000 | ORAL_TABLET | Freq: Every day | ORAL | Status: DC
  Administered 2018-03-23 – 2018-03-27 (×5): 1 via ORAL
  Filled 2018-03-22 (×5): qty 1

## 2018-03-22 MED ORDER — FUROSEMIDE 10 MG/ML IJ SOLN
40.0000 mg | Freq: Once | INTRAMUSCULAR | Status: AC
  Administered 2018-03-22: 40 mg via INTRAVENOUS
  Filled 2018-03-22: qty 4

## 2018-03-22 MED ORDER — ALBUTEROL (5 MG/ML) CONTINUOUS INHALATION SOLN
10.0000 mg/h | INHALATION_SOLUTION | Freq: Once | RESPIRATORY_TRACT | Status: AC
  Administered 2018-03-22: 10 mg/h via RESPIRATORY_TRACT
  Filled 2018-03-22: qty 20

## 2018-03-22 MED ORDER — ONDANSETRON HCL 4 MG/2ML IJ SOLN: 4.0000 mg | Freq: Four times a day (QID) | INTRAMUSCULAR | Status: DC | PRN

## 2018-03-22 MED ORDER — METHYLPREDNISOLONE SODIUM SUCC 125 MG IJ SOLR: 125.0000 mg | Freq: Once | INTRAMUSCULAR | Status: DC

## 2018-03-22 MED ORDER — INFLUENZA VAC SPLIT QUAD 0.5 ML IM SUSY
0.5000 mL | PREFILLED_SYRINGE | INTRAMUSCULAR | Status: DC
  Filled 2018-03-22: qty 0.5

## 2018-03-22 MED ORDER — ENOXAPARIN SODIUM 40 MG/0.4ML ~~LOC~~ SOLN
40.0000 mg | SUBCUTANEOUS | Status: DC
  Administered 2018-03-22 – 2018-03-24 (×3): 40 mg via SUBCUTANEOUS
  Filled 2018-03-22 (×5): qty 0.4

## 2018-03-22 MED ORDER — BUDESONIDE 0.25 MG/2ML IN SUSP: 0.2500 mg | Freq: Two times a day (BID) | RESPIRATORY_TRACT | Status: DC

## 2018-03-22 MED ORDER — FAMOTIDINE IN NACL 20-0.9 MG/50ML-% IV SOLN
20.0000 mg | Freq: Once | INTRAVENOUS | Status: AC
  Administered 2018-03-22: 20 mg via INTRAVENOUS
  Filled 2018-03-22: qty 50

## 2018-03-22 MED ORDER — METHYLPREDNISOLONE SODIUM SUCC 40 MG IJ SOLR: 40.0000 mg | Freq: Four times a day (QID) | INTRAMUSCULAR | Status: DC

## 2018-03-22 MED ORDER — PNEUMOCOCCAL VAC POLYVALENT 25 MCG/0.5ML IJ INJ
0.5000 mL | INJECTION | INTRAMUSCULAR | Status: DC
  Filled 2018-03-22: qty 0.5

## 2018-03-22 MED ORDER — IPRATROPIUM BROMIDE 0.02 % IN SOLN: 0.5000 mg | Freq: Once | RESPIRATORY_TRACT | Status: DC

## 2018-03-22 MED ORDER — ALBUTEROL SULFATE (2.5 MG/3ML) 0.083% IN NEBU
5.0000 mg | INHALATION_SOLUTION | Freq: Once | RESPIRATORY_TRACT | Status: AC
  Administered 2018-03-22: 5 mg via RESPIRATORY_TRACT
  Filled 2018-03-22: qty 6

## 2018-03-22 MED ORDER — IBUPROFEN 400 MG PO TABS: 800.0000 mg | ORAL_TABLET | Freq: Three times a day (TID) | ORAL | Status: DC | PRN

## 2018-03-22 MED ORDER — THIAMINE HCL 100 MG/ML IJ SOLN
100.0000 mg | Freq: Every day | INTRAMUSCULAR | Status: DC
  Administered 2018-03-24: 100 mg via INTRAVENOUS
  Filled 2018-03-22 (×2): qty 2

## 2018-03-22 MED ORDER — FOLIC ACID 1 MG PO TABS
1.0000 mg | ORAL_TABLET | Freq: Every day | ORAL | Status: DC
  Administered 2018-03-23 – 2018-03-27 (×4): 1 mg via ORAL
  Filled 2018-03-22 (×4): qty 1

## 2018-03-22 MED ORDER — FLUTICASONE FUROATE-VILANTEROL 200-25 MCG/INH IN AEPB
1.0000 | INHALATION_SPRAY | Freq: Every day | RESPIRATORY_TRACT | Status: DC
  Administered 2018-03-23 – 2018-03-24 (×2): 1 via RESPIRATORY_TRACT
  Filled 2018-03-22: qty 28

## 2018-03-22 MED ORDER — IPRATROPIUM-ALBUTEROL 0.5-2.5 (3) MG/3ML IN SOLN
3.0000 mL | Freq: Four times a day (QID) | RESPIRATORY_TRACT | Status: DC
  Administered 2018-03-22 – 2018-03-23 (×3): 3 mL via RESPIRATORY_TRACT
  Filled 2018-03-22 (×3): qty 3

## 2018-03-22 MED ORDER — METHYLPREDNISOLONE SODIUM SUCC 125 MG IJ SOLR
125.0000 mg | Freq: Three times a day (TID) | INTRAMUSCULAR | Status: DC
  Administered 2018-03-22 – 2018-03-23 (×3): 125 mg via INTRAVENOUS
  Filled 2018-03-22 (×3): qty 2

## 2018-03-22 MED ORDER — ALBUTEROL SULFATE (2.5 MG/3ML) 0.083% IN NEBU: 5.0000 mg | INHALATION_SOLUTION | Freq: Once | RESPIRATORY_TRACT | Status: DC

## 2018-03-22 MED ORDER — ACETAMINOPHEN 325 MG PO TABS
650.0000 mg | ORAL_TABLET | Freq: Four times a day (QID) | ORAL | Status: DC | PRN
  Administered 2018-03-25: 650 mg via ORAL
  Filled 2018-03-22: qty 2

## 2018-03-22 MED ORDER — NICOTINE 21 MG/24HR TD PT24
21.0000 mg | MEDICATED_PATCH | Freq: Every day | TRANSDERMAL | Status: DC
  Administered 2018-03-22 – 2018-03-27 (×7): 21 mg via TRANSDERMAL
  Filled 2018-03-22 (×7): qty 1

## 2018-03-22 MED ORDER — FAMOTIDINE 20 MG PO TABS: 20.0000 mg | ORAL_TABLET | Freq: Every day | ORAL | Status: DC

## 2018-03-22 MED ORDER — IPRATROPIUM BROMIDE 0.02 % IN SOLN
0.5000 mg | Freq: Once | RESPIRATORY_TRACT | Status: AC
  Administered 2018-03-22: 0.5 mg via RESPIRATORY_TRACT
  Filled 2018-03-22: qty 2.5

## 2018-03-22 MED ORDER — ALBUTEROL SULFATE (2.5 MG/3ML) 0.083% IN NEBU
2.5000 mg | INHALATION_SOLUTION | RESPIRATORY_TRACT | Status: DC | PRN
  Administered 2018-03-23 – 2018-03-24 (×2): 2.5 mg via RESPIRATORY_TRACT
  Filled 2018-03-22 (×3): qty 3

## 2018-03-22 MED ORDER — FOLIC ACID 5 MG/ML IJ SOLN
1.0000 mg | Freq: Every day | INTRAMUSCULAR | Status: DC
  Administered 2018-03-24: 1 mg via INTRAVENOUS
  Filled 2018-03-22 (×4): qty 0.2

## 2018-03-22 MED ORDER — VITAMIN B-1 100 MG PO TABS
100.0000 mg | ORAL_TABLET | Freq: Every day | ORAL | Status: DC
  Administered 2018-03-23 – 2018-03-27 (×4): 100 mg via ORAL
  Filled 2018-03-22 (×4): qty 1

## 2018-03-22 MED ORDER — ASPIRIN 81 MG PO CHEW
162.0000 mg | CHEWABLE_TABLET | Freq: Every day | ORAL | Status: DC
  Administered 2018-03-23 – 2018-03-26 (×4): 162 mg via ORAL
  Filled 2018-03-22 (×4): qty 2

## 2018-03-22 MED ORDER — LORAZEPAM 1 MG PO TABS: 1.0000 mg | ORAL_TABLET | Freq: Four times a day (QID) | ORAL | Status: DC | PRN

## 2018-03-22 MED ORDER — LORAZEPAM 2 MG/ML IJ SOLN
1.0000 mg | Freq: Four times a day (QID) | INTRAMUSCULAR | Status: DC | PRN
  Administered 2018-03-22: 1 mg via INTRAVENOUS
  Filled 2018-03-22: qty 1

## 2018-03-22 MED ORDER — SODIUM CHLORIDE 0.9 % IV SOLN
1.0000 g | INTRAVENOUS | Status: DC
  Administered 2018-03-22: 1 g via INTRAVENOUS
  Filled 2018-03-22: qty 10

## 2018-03-22 MED ORDER — DEXMEDETOMIDINE HCL IN NACL 400 MCG/100ML IV SOLN
0.2000 ug/kg/h | INTRAVENOUS | Status: DC
  Administered 2018-03-23: 0.4 ug/kg/h via INTRAVENOUS
  Administered 2018-03-23: 0.5 ug/kg/h via INTRAVENOUS
  Administered 2018-03-23: 0.3 ug/kg/h via INTRAVENOUS
  Filled 2018-03-22: qty 100
  Filled 2018-03-22: qty 200
  Filled 2018-03-22 (×3): qty 100

## 2018-03-22 MED ORDER — NALOXONE HCL 0.4 MG/ML IJ SOLN
INTRAMUSCULAR | Status: AC
  Administered 2018-03-22: 0.4 mg
  Filled 2018-03-22: qty 1

## 2018-03-22 MED ORDER — ALBUTEROL SULFATE (2.5 MG/3ML) 0.083% IN NEBU: 2.5000 mg | INHALATION_SOLUTION | RESPIRATORY_TRACT | Status: DC | PRN

## 2018-03-22 MED ORDER — ACETAMINOPHEN 650 MG RE SUPP: 650.0000 mg | Freq: Four times a day (QID) | RECTAL | Status: DC | PRN

## 2018-03-22 MED ORDER — FOLIC ACID 1 MG PO TABS: 1.0000 mg | ORAL_TABLET | Freq: Every day | ORAL | Status: DC

## 2018-03-22 MED ORDER — PREDNISONE 20 MG PO TABS: 40.0000 mg | ORAL_TABLET | Freq: Every day | ORAL | Status: DC

## 2018-03-22 NOTE — ED Triage Notes (Signed)
Pt arrives via EMS with complains of SOB since yesterday. Pt was given 15 mg albuterol, 1 mg atrovent, 125mg  solu medrol, 2g magnesium by EMS. Denies n/v/fever/cp. Pt has hx of COPD. HR 110, BP 128/68.

## 2018-03-22 NOTE — Progress Notes (Signed)
Nutrition Consult/Brief Note  RD consulted via COPD Focused Protocol.  Wt Readings from Last 15 Encounters:  03/22/18 (!) 147.4 kg  07/31/17 (!) 147.4 kg  03/19/17 (!) 144.7 kg  03/07/17 (!) 149.7 kg  03/06/17 (!) 147.9 kg  02/22/17 (!) 149.9 kg  01/30/17 (!) 149.7 kg  12/19/16 (!) 146.5 kg  10/24/16 (!) 145 kg  07/03/16 (!) 141.8 kg  04/12/16 (!) 143.2 kg  10/19/15 (!) 141.5 kg  03/16/15 (!) 138.8 kg  12/16/14 (!) 137.9 kg  11/18/14 136.1 kg   Body mass index is 44.08 kg/m. Patient meets criteria for Obesity Class III based on current BMI.   Current diet order is Regular. Labs and medications reviewed. No nutrition interventions warranted at this time.  If nutrition issues arise, please consult RD.   Arthur Holms, RD, LDN Pager #: [phone] After-Hours Pager #: [phone]

## 2018-03-22 NOTE — Progress Notes (Signed)
Assisted with transport to [facility]M05.  Dr Valeta Harms at bedside. Mother at bedside.  Handoff with MICU RNs.

## 2018-03-22 NOTE — Progress Notes (Addendum)
Late note: Patient began hypoventilating and desaturating to 70's while on BiPAP with 40% FiO2 about 15 minutes after receiving Ativan. When he was given Ativan, he had a CIWA score of 5, but probably could have scored higher. He was having difficulty tolerating the BiPAP, was complaining of being hot, was sweating and needed to sit up on the edge of the bed. No opiates were administered, but IV Narcan was given on an historical basis with no change. Patient was rousable only to deep sternal rub. Oxygen was increased to 50% on BiPAP. Patient's DO came to round on patient shortly after andrapid response was called to move patient to ICU. Report given at bedside.

## 2018-03-22 NOTE — Progress Notes (Addendum)
Transfer Summary:  Patient was seen earlier today in consultation. Please see prior note for complete H&P. After admission, patient became agitated with tremors and given 1 mg ativan due to concern for alcohol withdrawal. Unfortunately he subsequently became lethargic and unresponsive. PCCM was called back to bedside. Fortunately patient became responsive after aggressive stimulation. Decision was made to transfer to ICU for closer monitoring of mental status and airway protection. Will continue BiPAP for now. CIWA protocol, holding ativan. If patient develops withdrawal precedex may be better tolerated. Of note, patient also developed hives as antibiotics were started. Ceftriaxone discontinued. Methylprednisolone already scheduled.   Velna Ochs, M.D. - PGY3 03/22/2018, 5:36 PM

## 2018-03-22 NOTE — ED Notes (Addendum)
Pt completed albuterol treatment. Pt's sats 78% on room air. Placed on 5L [place] with no real improvement- sats 82%. Placed pt on nonrebreather; sats now 90%. Will notify provider.

## 2018-03-22 NOTE — Progress Notes (Signed)
Sputum container left at bedside in reach. Instructed patient to cough sputum into when he is able. Pt stated he was not able to at this time.

## 2018-03-22 NOTE — Consult Note (Addendum)
NAME:  Billy [name], MRN:  [id number], DOB:  [birth year], LOS: 0 ADMISSION DATE:  03/22/2018, CONSULTATION DATE:  03/22/2018 REFERRING MD:  Valere Dross, CHIEF COMPLAINT:  Acute on chronic respiratory failure   Brief History   48 year old current every day smoker with COPD , polycythemia, and OSA  nocturnal hypoxemia ( wears CPAP)  with a history of bronchitis 2 weeks ago, with acute decompensation with wheezing and worsening dyspnea over the last 2 days to the point of needing oxygen 24/7 and being too weak to walk. His albuterol inhaler did not relieve his dyspnea. He had an OP appointment with Pulmonary ( Patient of Dr. Lamonte Sakai) day of admission, called the office asking to be seen sooner than noon, and was advised to call 911 and seek emergency care.  History of present illness   49 year old current every day smoker with COPD and OSA  nocturnal hypoxemia ( wears CPAP)  with a history of bronchitis 2 weeks ago, with acute decompensation over the last 2 days to the point of needing oxygen 24/7 and being too weak to walk. He had an OP appointment with Pulmonary ( Patient of Dr. Lamonte Sakai) day of admission, called the office asking to be seen sooner than noon, and was advised to call 911 and seek emergency care.  He was transported to the [facility] ED via EMS. Oxygen saturations were 78% on RA when EMS arrived. He was wheezing and diminished upon auscultation. He stated he was having a hard time " catching his breath".He was tachypneic, tachycardic, and was satting 88% on oxygen when he presented. During transport  received 3 DuoNeb treatments, Solu-Medrol, magnesium.  He was  placed on continuous albuterol.  treated with in the ED. Work-up demonstrated polycythemia with hemoglobin of 21.4 and hematocrit of 64.5. Ferritin, iron and TIBC was ordered.  This is elevated greater than prior lab draws.  Troponin is negative.  EKG normal sinus rhythm.  Baseline wander, but no obvious signs of acute ischemia suggestive of  ACS.  Patient has  normal renal function. Electrolytes overall normal with sodium of 134 chloride 94.  Patient's chest x-ray is demonstrating pulmonary vascular congestion pulmonary edema.  Patient does not have a known history of CHF.BNP 39.4. The patient was place on BiPAP, which he tolerated x 45 minutes. He was then place on a 60% non-rebreather. Blood Gas done once removed from BiPAP was 7.26/ 76.2/105/ 34.2. Patient denies any recent immobilization, hospitalization, hormone use, cancer treatment, recent surgery, history of DVT/PE, family history DVT/PE, lower extremity edema or calf tenderness, or hemoptysis.  PCCM has been asked to consult for acute on chronic respiratory failure in setting of COPD, OSA, and ? CHF.   Of note , patient drinks 14 beers per day.He smells strongly of cigarettes.  Past Medical History   Past Medical History:  Diagnosis Date  . COPD (chronic obstructive pulmonary disease) ([place])   . History of bronchitis   . Obesity   . Polycythemia    a. Dr. Marin Olp.Marland KitchenMarland KitchenJAK-2 analysis is negative.  b. likely physiologic secondary to chronic hypoxia (? OSA + COPD + obesity).  c. s/p plebotomy x1  . PVD (peripheral vascular disease) ([place])   . Sleep apnea    a. mod severe by sleep study 9.30.11: AHI 44.8 per hour; oxygen desat to a nadir of 65%; unsuccessful CPAP titration; significant hypoxemia even with supp o2 at 3LPM (dest to 70-80%); needs eval for cardiopulmonary disease and upper airway obstruction  . Tobacco  abuse     Significant Hospital Events   1/24>> Admission  Consults:  PCCM  Procedures:    Significant Diagnostic Tests:  1/24>> Echo  Micro Data:  1/24 Sputum Culture>>  Antimicrobials:     Interim history/subjective:  Currently wheezing is better, remains on BIPAP. Diminished throughout  Objective   Blood pressure (!) 143/84, pulse (!) 104, temperature 98.3 F (36.8 C), temperature source Axillary, resp. rate 20, SpO2 97 %.    Vent Mode:  BIPAP FiO2 (%):  [50 %-60 %] 60 %  No intake or output data in the 24 hours ending 03/22/18 1359 There were no vitals filed for this visit.  Examination: General: Overweight male, awake and alert,  wearing NRB, sat of 94%, RR of 25  HENT: Thick neck, No LAD, MM pink and dry. Lungs: Bilateral chest excursion, diminished throughout, few wheezes noted, no sternal retractions or nasal flaring Cardiovascular: S1, S2, RRR, No RMG Abdomen: Obese, BS +, ND, NT Extremities: Flushed, warm and dry, no obvious deformities Neuro: Cranial Nerves intact, MAE x 4, awake alert and apprioriate Psych: Anxious  Resolved Hospital Problem list     Assessment & Plan:  Acute on chronic respiratory failure in setting of COPD and OSA Respiratory acidosis CXR with mild cardiomegaly and pulmonary  Vascular congestion/ interstitial edema Bronchitis 2 weeks ago Afebrile, WBC WNL Hypercarbia per ABG Maintenance regimen is Symbicort and Spiriva Current every day smoker Smokes mariajuana once daily   Plan: BiPAP 4 hours on 2 hours off.. mandatory at bedtime NPO for now Titrate oxygen for sats of 88-92% ABG in 4 hours Sputum Culture Follow micro Consider Doxycycline  Consider CTA RVP swab Solumedrol 125 mg Q 8 x 4 doses Wean slowly once bronchospasm resolves Continue Scheduled BD ( Pulmicort, Duoneb) Albuterol nebs Q 2 prn Check and replete Mag Trend CXR daily Lasix as renal function allows Minimize sedation Smoking cessation Counseling  Pulmonary vascular congestion per CXR BNP 39 Plan Echo Lasix again at 2000 on 1/24 Trend and replete potassium CXR 1/25  Polycythemia HGB of 21.4 Plan Will need phlebotomy as OP  ETOH Drinks 14 beers per day Plan: CIWA Assessment Q 4 Add ativan if s/s withdrawal   Best practice:  Diet: NPO while on BiPAP Pain/Anxiety/Delirium protocol (if indicated): NA VAP protocol (if indicated): NA DVT prophylaxis: Per primary team GI prophylaxis:  Pepcid Glucose control: CBG Mobility: BR with BR privileges Code Status: Full Family Communication: Patient and mother updated at bedside Disposition: 2W>> progressive    Labs   CBC: Recent Labs  Lab 03/22/18 0847  WBC 7.0  HGB 21.4*  HCT 64.5*  MCV 109.7*  PLT 146*    Basic Metabolic Panel: Recent Labs  Lab 03/22/18 1022 03/22/18 1108  NA 134*  --   K 4.7  --   CL 94*  --   CO2 28  --   GLUCOSE 126*  --   BUN 10  --   CREATININE 1.02 1.00  CALCIUM 9.1  --    GFR: CrCl cannot be calculated (Unknown ideal weight.). Recent Labs  Lab 03/22/18 0847  WBC 7.0    Liver Function Tests: No results for input(s): AST, ALT, ALKPHOS, BILITOT, PROT, ALBUMIN in the last 168 hours. No results for input(s): LIPASE, AMYLASE in the last 168 hours. No results for input(s): AMMONIA in the last 168 hours.  ABG    Component Value Date/Time   PHART 7.338 (L) 11/20/2008 1055   PCO2ART (HH) 11/20/2008 1055  63.7 CRITICAL RESULT CALLED TO, READ BACK BY AND VERIFIED WITH: AMANDARASH,RN AT 1114,BY TAMMIEREADLINGRRT,RCPON9/24/10   PO2ART 50.3 (L) 11/20/2008 1055   HCO3 33.3 (H) 11/20/2008 1055   TCO2 35.3 11/20/2008 1055   O2SAT 84.9 11/20/2008 1055     Coagulation Profile: No results for input(s): INR, PROTIME in the last 168 hours.  Cardiac Enzymes: No results for input(s): CKTOTAL, CKMB, CKMBINDEX, TROPONINI in the last 168 hours.  HbA1C: Hgb A1c MFr Bld  Date/Time Value Ref Range Status  07/31/2017 01:40 PM 5.6 4.6 - 6.5 % Final    Comment:    Glycemic Control Guidelines for People with Diabetes:Non Diabetic:  <6%Goal of Therapy: <7%Additional Action Suggested:  >8%     CBG: No results for input(s): GLUCAP in the last 168 hours.  Review of Systems:   Gen: Denies fever, chills, weight change, fatigue, night sweats HEENT: Denies blurred vision, double vision, hearing loss, tinnitus, sinus congestion, rhinorrhea, sore throat, neck stiffness, dysphagia, recent  bronchitis PULM:  shortness of breath, cough, sputum production, hemoptysis, wheezing CV: Denies chest pain, edema, orthopnea, + paroxysmal nocturnal dyspnea, palpitations GI: Denies abdominal pain, nausea, vomiting, diarrhea, hematochezia, melena, constipation, change in bowel habits GU: Denies dysuria, hematuria, polyuria, oliguria, urethral discharge Endocrine: Denies hot or cold intolerance, polyuria, polyphagia or appetite change Derm: Denies rash, dry skin, scaling or peeling skin change Heme: Denies easy bruising, bleeding, bleeding gums Neuro: Denies headache, numbness, weakness, slurred speech, loss of memory or consciousness  Past Medical History  He,  has a past medical history of COPD (chronic obstructive pulmonary disease) ([name]), History of bronchitis, Obesity, Polycythemia, PVD (peripheral vascular disease) ([place]), Sleep apnea, and Tobacco abuse.   Surgical History    Past Surgical History:  Procedure Laterality Date  . knee injury  1982  . [place]     Social History   reports that he has been smoking cigarettes. He started smoking about 32 years ago. He has a 12.50 pack-year smoking history. He has never used smokeless tobacco. He reports current alcohol use of about 70.0 standard drinks of alcohol per week. He reports current drug use. Frequency: 1.00 time per week. Drug: Marijuana.   Family History   His family history includes Alcohol abuse in his father; Asthma in his father; Drug abuse in his father; Lung cancer in his paternal grandmother; Other in his mother.   Allergies No Known Allergies   Home Medications  Prior to Admission medications   Medication Sig Start Date End Date Taking? Authorizing Provider  aspirin 81 MG tablet Take 161 mg by mouth daily.    Yes [provider]  budesonide-formoterol (SYMBICORT) 160-4.5 MCG/ACT inhaler Inhale 2 puffs into the lungs 2 (two) times daily. 03/20/18  Yes Collene Gobble, MD   ibuprofen (ADVIL,MOTRIN) 800 MG tablet Take 1 tablet (800 mg total) by mouth every 8 (eight) hours as needed. Patient taking differently: Take 800 mg by mouth every 8 (eight) hours as needed for moderate pain.  08/10/17  Yes [place], Jennifer L, DPM  PROAIR HFA 108 (90 Base) MCG/ACT inhaler INHALE 2 PUFFS INTO THE LUNGS EVERY 6 HOURS AS NEEDED FOR SHORTNESS OF BREATH Patient taking differently: Inhale 2 puffs into the lungs every 6 (six) hours as needed for wheezing or shortness of breath.  08/21/17  Yes Byrum, Rose Fillers, MD  tiotropium (SPIRIVA HANDIHALER) 18 MCG inhalation capsule PLACE 1 CAPSULE INTO INHALER AND INHALE DAILY Patient taking differently: Place 18 mcg into inhaler and inhale  daily.  03/20/18  Yes Collene Gobble, MD  sildenafil (VIAGRA) 50 MG tablet Take 1 tablet (50 mg total) by mouth daily as needed for erectile dysfunction. 04/28/11 05/12/11  Volanda Napoleon, MD     App Critical care time: 37 minutes    Magdalen Spatz, AGACNP-BC [place] Pager # [phone] After 3 pm call [phone] 03/22/2018 3:00 PM

## 2018-03-22 NOTE — Progress Notes (Signed)
RT note- Called to room for concern of hypoventilation and decreased RR, LOC. Patient had been given ativan and now Narcan by RN, Fio2 increased to 50% by RN, sp02 now 94-95%. Continue to monitor.

## 2018-03-22 NOTE — ED Notes (Signed)
Ordered meal tray for pt 

## 2018-03-22 NOTE — ED Provider Notes (Addendum)
[facility]ENC[facility] DEPARTMENT Provider Note   CSN: [id number] Arrival date & time: 03/22/18  2703     History   Chief Complaint Chief Complaint  Patient presents with  . Shortness of Breath    HPI Billy [name] is a 48 y.o. male.  HPI  Patient is a 48 year old male with a history of COPD, polycythemia, sleep apnea on CPAP, tobacco use presenting for shortness of breath and wheezing.  Patient reports that he he began having worsening of his breathing 2 days ago.  He reports that he has been "too short of breath to cough", but he does have sputum production when he does cough.  He denies any recent chills, myalgias, or fevers.  Denies any chest pain over this interval.  Patient denies any recent known exposures of allergens.  Patient reports that he stopped smoking yesterday.  Patient denies any recent immobilization, hospitalization, hormone use, cancer treatment, recent surgery, history of DVT/PE, family history DVT/PE, lower extremity edema or calf tenderness, or hemoptysis.  Patient reports that he uses CPAP last night, and he has been using albuterol inhalers at home without relief.  Patient does also report that he drinks most days," a lot" indicating sometimes greater than 6 pack of beer.  Denies history of withdrawal syndromes.  Past Medical History:  Diagnosis Date  . COPD (chronic obstructive pulmonary disease) ([place])   . History of bronchitis   . Obesity   . Polycythemia    a. Dr. Marin Olp.Marland KitchenMarland KitchenJAK-2 analysis is negative.  b. likely physiologic secondary to chronic hypoxia (? OSA + COPD + obesity).  c. s/p plebotomy x1  . PVD (peripheral vascular disease) ([place])   . Sleep apnea    a. mod severe by sleep study 9.30.11: AHI 44.8 per hour; oxygen desat to a nadir of 65%; unsuccessful CPAP titration; significant hypoxemia even with supp o2 at 3LPM (dest to 70-80%); needs eval for cardiopulmonary disease and upper airway obstruction  . Tobacco abuse      Patient Active Problem List   Diagnosis Date Noted  . Allergic rhinitis 06/25/2012  . Polycythemia vera ([place]) 06/24/2012  . CONJUNCTIVITIS, BACTERIAL 12/22/2009  . Obstructive sleep apnea 12/14/2009  . PATENT FORAMEN OVALE 11/17/2009  . HYPOXEMIA 11/17/2009  . Polycythemia, secondary 09/22/2009  . THROMBOCYTOPENIA 09/16/2009  . Alcohol abuse 09/16/2009  . COPD (chronic obstructive pulmonary disease) ([place]) 09/16/2009  . OBESITY 01/06/2009  . TOBACCO ABUSE 01/06/2009  . PERIPHERAL VASCULAR DISEASE 01/06/2009  . BRONCHITIS 01/06/2009    Past Surgical History:  Procedure Laterality Date  . knee injury  1982  . TONSILLECTOMY AND ADENOIDECTOMY  1972        Home Medications    Prior to Admission medications   Medication Sig Start Date End Date Taking? Authorizing Provider  albuterol (PROVENTIL HFA;VENTOLIN HFA) 108 (90 Base) MCG/ACT inhaler INHALE 2 PUFFS INTO THE LUNGS EVERY 6 HOURS AS NEEDED FOR SHORTNESS OF BREATH 03/20/18   Collene Gobble, MD  aspirin 81 MG tablet Take 81 mg by mouth daily.      [provider]  budesonide-formoterol (SYMBICORT) 160-4.5 MCG/ACT inhaler Inhale 2 puffs into the lungs 2 (two) times daily. 03/20/18   Collene Gobble, MD  ibuprofen (ADVIL,MOTRIN) 800 MG tablet Take 1 tablet (800 mg total) by mouth every 8 (eight) hours as needed. 08/10/17   Marzetta Board, DPM  PROAIR HFA 108 (90 Base) MCG/ACT inhaler INHALE 2 PUFFS INTO THE LUNGS EVERY 6 HOURS AS NEEDED FOR SHORTNESS  OF BREATH 08/21/17   Collene Gobble, MD  SPIRIVA HANDIHALER 18 MCG inhalation capsule PLACE 1 CAPSULE INTO INHALER AND INHALE DAILY 06/07/17   Collene Gobble, MD  SPIRIVA HANDIHALER 18 MCG inhalation capsule PLACE 1 CAPSULE INTO INHALER AND INHALE DAILY 08/21/17   Collene Gobble, MD  tiotropium (SPIRIVA HANDIHALER) 18 MCG inhalation capsule PLACE 1 CAPSULE INTO INHALER AND INHALE DAILY 03/20/18   Collene Gobble, MD  sildenafil (VIAGRA) 50 MG tablet Take 1 tablet (50 mg  total) by mouth daily as needed for erectile dysfunction. 04/28/11 05/12/11  Volanda Napoleon, MD    Family History Family History  Problem Relation Age of Onset  . Other Mother        Wegener's Disease  . Lung cancer Paternal Grandmother   . Drug abuse Father   . Alcohol abuse Father   . Asthma Father     Social History Social History   Tobacco Use  . Smoking status: Current Every Day Smoker    Packs/day: 0.50    Years: 25.00    Pack years: 12.50    Types: Cigarettes    Start date: 02/08/1986  . Smokeless tobacco: Never Used  . Tobacco comment: 06-08-14  still smoking  Substance Use Topics  . Alcohol use: Yes    Alcohol/week: 70.0 standard drinks    Types: 70 Cans of beer per week  . Drug use: Yes    Frequency: 1.0 times per week    Types: Marijuana    Comment: marajuana...also valium not prescribed     Allergies   Patient has no known allergies.   Review of Systems Review of Systems  Constitutional: Negative for chills and fever.  HENT: Negative for congestion, rhinorrhea, sinus pain and sore throat.   Eyes: Negative for visual disturbance.  Respiratory: Positive for cough and shortness of breath. Negative for chest tightness.   Cardiovascular: Negative for chest pain, palpitations and leg swelling.  Gastrointestinal: Negative for abdominal pain, nausea and vomiting.  Genitourinary: Negative for dysuria and flank pain.  Musculoskeletal: Negative for back pain and myalgias.  Skin: Negative for rash.  Neurological: Negative for dizziness, syncope, light-headedness and headaches.     Physical Exam Updated Vital Signs BP (!) 159/87 (BP Location: Right Arm)   Resp 20   SpO2 92%   Physical Exam Vitals signs and nursing note reviewed.  Constitutional:      General: He is not in acute distress.    Appearance: He is well-developed.  HENT:     Head: Normocephalic and atraumatic.  Eyes:     Conjunctiva/sclera: Conjunctivae normal.     Pupils: Pupils are  equal, round, and reactive to light.  Neck:     Musculoskeletal: Normal range of motion and neck supple.  Cardiovascular:     Rate and Rhythm: Regular rhythm. Tachycardia present.     Heart sounds: S1 normal and S2 normal. No murmur.  Pulmonary:     Effort: Tachypnea present. No accessory muscle usage.     Breath sounds: No stridor. Examination of the right-upper field reveals wheezing. Examination of the left-upper field reveals decreased breath sounds. Examination of the right-middle field reveals wheezing. Examination of the left-middle field reveals decreased breath sounds. Examination of the right-lower field reveals wheezing. Examination of the left-lower field reveals decreased breath sounds. Decreased breath sounds and wheezing present. No rales.     Comments: Patient does have breath sounds on left, but are diminished compared to right.  Abdominal:  General: There is no distension.     Palpations: Abdomen is soft.     Tenderness: There is no abdominal tenderness. There is no guarding.  Musculoskeletal: Normal range of motion.        General: No deformity.     Right lower leg: No edema.     Left lower leg: No edema.     Comments: Rubor of lower extremities.   Lymphadenopathy:     Cervical: No cervical adenopathy.  Skin:    General: Skin is warm and dry.     Findings: No erythema or rash.  Neurological:     Mental Status: He is alert.     Comments: Cranial nerves grossly intact. Patient moves extremities symmetrically and with good coordination.  Psychiatric:        Behavior: Behavior normal.        Thought Content: Thought content normal.        Judgment: Judgment normal.      ED Treatments / Results  Labs (all labs ordered are listed, but only abnormal results are displayed) Labs Reviewed  CBC - Abnormal; Notable for the following components:      Result Value   RBC 5.88 (*)    Hemoglobin 21.4 (*)    HCT 64.5 (*)    MCV 109.7 (*)    MCH 36.4 (*)    Platelets  146 (*)    All other components within normal limits  BASIC METABOLIC PANEL - Abnormal; Notable for the following components:   Sodium 134 (*)    Chloride 94 (*)    Glucose, Bld 126 (*)    All other components within normal limits  BRAIN NATRIURETIC PEPTIDE  IRON AND TIBC  FERRITIN  BLOOD GAS, VENOUS  I-STAT TROPONIN, ED  I-STAT CREATININE, ED    EKG EKG Interpretation  Date/Time:  Friday March 22 2018 08:44:19 EST Ventricular Rate:  115 PR Interval:    QRS Duration: 107 QT Interval:  342 QTC Calculation: 473 R Axis:   -115 Text Interpretation:  Sinus tachycardia Baseline wander Confirmed by Lajean Saver [phone]) on 03/22/2018 8:51:19 AM  ED ECG REPORT   Date: 03/22/2018  Rate: 105  Rhythm: sinus tachycardia  QRS Axis: right  Intervals: normal  ST/T Wave abnormalities: nonspecific ST changes  Conduction Disutrbances:none  Narrative Interpretation:   Old EKG Reviewed: unchanged    Radiology Dg Chest Port 1 View  Result Date: 03/22/2018 CLINICAL DATA:  Shortness of Breath EXAM: PORTABLE CHEST 1 VIEW COMPARISON:  Jun 29, 2014 FINDINGS: There is mild cardiomegaly with pulmonary venous hypertension. There is interstitial thickening, likely due to a degree of interstitial edema. No consolidation or pleural effusion evident. No adenopathy. No bone lesions. IMPRESSION: Pulmonary vascular congestion with apparent degree of interstitial edema. No frank consolidation. No adenopathy evident. Electronically Signed   By: Lowella Grip III M.D.   On: 03/22/2018 09:15    Procedures Procedures (including critical care time)  CRITICAL CARE Performed by: Albesa Seen   Total critical care time: 35 minutes  Critical care time was exclusive of separately billable procedures and treating other patients.  Critical care was necessary to treat or prevent imminent or life-threatening deterioration.  Critical care was time spent personally by me on the following activities:  development of treatment plan with patient and/or surrogate as well as nursing, discussions with consultants, evaluation of patient's response to treatment, examination of patient, obtaining history from patient or surrogate, ordering and performing treatments and interventions, ordering and  review of laboratory studies, ordering and review of radiographic studies, pulse oximetry and re-evaluation of patient's condition.   Medications Ordered in ED Medications  albuterol (PROVENTIL,VENTOLIN) solution continuous neb (has no administration in time range)     Initial Impression / Assessment and Plan / ED Course  I have reviewed the triage vital signs and the nursing notes.  Pertinent labs & imaging results that were available during my care of the patient were reviewed by me and considered in my medical decision making (see chart for details).  Clinical Course as of Mar 23 1155  Fri Mar 22, 2018  0928 Patient has history of polycythemia, followed by Dr. Marin Olp. May also be influenced by COPD.   Hemoglobin(!!): 21.4 [AM]  0931 No history of HF. Do not suspect that pulmonary edema is flash pulmonary edema.   DG Chest Port 1 View [AM]  4401 Reassessed.  Wheezes have improved and lung sounds are less diminished.  Rales in bilateral lung bases are not appreciated.  Patient is still dyspneic and hypoxic on room air.  Will switch to BiPAP.  Awaiting creatinine to administer Lasix.   [AM]  8 Spoke with Dr. Sloan Leiter of [facility] hospitalist will admit the patient.  I appreciate his involvement in the care of this patient.   [AM]    Clinical Course User Index [AM] Albesa Seen, PA-C    Patient nontoxic-appearing, however he is tachypneic, tachycardic, and was satting 88% on oxygen when he presented.  He doctor received 3 DuoNeb treatments, Solu-Medrol, magnesium in the ambulance.  Will place patient on continuous albuterol.  Differential diagnosis includes HF exacerbation, reactive airway disease,  COPD exacerbation, pneumonia, PE, ACS, cardiac tamponade, retropharyngeal abscess, neuromuscular causes (AIDP, myasthenia gravis, wound botulism, anemia, toxicologic (CO toxicity, MetHb, ASA overdose).   Work-up demonstrating polycythemia with hemoglobin of 21.4 and hematocrit of 64.5.  Will order ferritin, iron and TIBC.  This is elevated greater than prior.  Troponin is negative.  EKG normal sinus rhythm.  Baseline wander, but no obvious signs of acute ischemia suggestive of ACS.  Will repeat.  Normal renal function.  Electrolytes overall normal with sodium of 134 chloride 94.  Patient's chest x-ray is demonstrating pulmonary vascular congestion pulmonary edema.  Patient does not have a known history of CHF.   The results of patient's VBG are not crossing over in chart.  They are as follows:   PH 7.284  PCo2 75 mmHg PO2 58 mmHg BE, B 4 mmol/L HCO3 35.6 mmol/L TCO2 38 mmol/L sO2 85%  This is a shared visit with Dr. Maralyn Sago. Patient was independently evaluated by this attending physician. Attending physician consulted in evaluation and management.  Final Clinical Impressions(s) / ED Diagnoses   Final diagnoses:  Acute pulmonary edema [facility])  COPD exacerbation [facility])  Polycythemia    ED Discharge Orders    None       Tamala Julian 03/22/18 1159    [address] 03/22/18 1221    Lajean Saver, MD 03/23/18 [phone]

## 2018-03-22 NOTE — Progress Notes (Signed)
Pt c/o of BIPAP.  States he cant breathe with mask on.  Placed pt on pt rebreather SPO2 97%.  Pt in no distress

## 2018-03-22 NOTE — H&P (Signed)
History and Physical    Billy [name] EPP:[id number] DOB: 1971-01-27 DOA: 03/22/2018  PCP: Dorothyann Peng, NP  Patient coming from: Home.  I have personally briefly reviewed patient's old medical records in [facility] and Care everywhere.   Chief Complaint: Shortness of breath.  HPI: Billy [name] is a 48 y.o. male with medical history significant of sleep apnea on CPAP, morbid obesity, COPD, 1 pack/day smoker and ongoing, polycythemia who presented to the emergency room with 2 days of shortness of breath and wheezing.  According to the patient, he had been feeling like this for more than a month now.  He always have shortness of breath but it was manageable.  He even used to get phlebotomy in the past, last 02/2014 for polycythemia.  He had a pulmonary appointment today so was hoping to make it today to the office.  Since last 2 days he is having profound wheezing, continuous, used albuterol at home without help.  He does have a dry cough but no sputum.  Denies any fever or chills.  Denies any sick contacts.  No recent travel.  No chest pain.  He continues to smoke, however he could only smoke 1 cigarette yesterday because of shortness of breath.  Today morning, he called his mom because he could hardly breathe who called EMS and patient was brought to the ER.  Patient also drinks sixpack of beer every night. ED Course: Patient was found in severe respiratory distress.His blood pressures are stable in the ER.  Hemoglobin is 21 and hematocrit is 64.  Chest x-ray shows poor visualization of the bases which is probably due to obesity, there is prominent bronchovascular markings.proBNP is 39. Patient was initially put on BiPAP, he wanted to eat and take it out, currently on nonrebreather. On my evaluation, patient was willing to eat something and was very hungry. Patient was given multiple nebulizers and steroids. Blood gas analysis is pending.  Most of his blood gets clotted on  drawing.  Review of Systems: As per HPI otherwise 10 point review of systems negative.    Past Medical History:  Diagnosis Date  . COPD (chronic obstructive pulmonary disease) ([place])   . History of bronchitis   . Obesity   . Polycythemia    a. Dr. Marin Olp.Marland KitchenMarland KitchenJAK-2 analysis is negative.  b. likely physiologic secondary to chronic hypoxia (? OSA + COPD + obesity).  c. s/p plebotomy x1  . PVD (peripheral vascular disease) ([place])   . Sleep apnea    a. mod severe by sleep study 9.30.11: AHI 44.8 per hour; oxygen desat to a nadir of 65%; unsuccessful CPAP titration; significant hypoxemia even with supp o2 at 3LPM (dest to 70-80%); needs eval for cardiopulmonary disease and upper airway obstruction  . Tobacco abuse     Past Surgical History:  Procedure Laterality Date  . knee injury  1982  . [place]     reports that he has been smoking cigarettes. He started smoking about 32 years ago. He has a 12.50 pack-year smoking history. He has never used smokeless tobacco. He reports current alcohol use of about 70.0 standard drinks of alcohol per week. He reports current drug use. Frequency: 1.00 time per week. Drug: Marijuana.  No Known Allergies  Family History  Problem Relation Age of Onset  . Other Mother        Wegener's Disease  . Lung cancer Paternal Grandmother   . Drug abuse Father   . Alcohol abuse Father   .  Asthma Father      Prior to Admission medications   Medication Sig Start Date End Date Taking? Authorizing Provider  aspirin 81 MG tablet Take 161 mg by mouth daily.    Yes [provider]  budesonide-formoterol (SYMBICORT) 160-4.5 MCG/ACT inhaler Inhale 2 puffs into the lungs 2 (two) times daily. 03/20/18  Yes Collene Gobble, MD  ibuprofen (ADVIL,MOTRIN) 800 MG tablet Take 1 tablet (800 mg total) by mouth every 8 (eight) hours as needed. Patient taking differently: Take 800 mg by mouth every 8 (eight) hours as needed for moderate pain.   08/10/17  Yes [place], Jennifer L, DPM  PROAIR HFA 108 (90 Base) MCG/ACT inhaler INHALE 2 PUFFS INTO THE LUNGS EVERY 6 HOURS AS NEEDED FOR SHORTNESS OF BREATH Patient taking differently: Inhale 2 puffs into the lungs every 6 (six) hours as needed for wheezing or shortness of breath.  08/21/17  Yes Byrum, Rose Fillers, MD  tiotropium (SPIRIVA HANDIHALER) 18 MCG inhalation capsule PLACE 1 CAPSULE INTO INHALER AND INHALE DAILY Patient taking differently: Place 18 mcg into inhaler and inhale daily.  03/20/18  Yes Collene Gobble, MD  sildenafil (VIAGRA) 50 MG tablet Take 1 tablet (50 mg total) by mouth daily as needed for erectile dysfunction. 04/28/11 05/12/11  Volanda Napoleon, MD    Physical Exam: Vitals:   03/22/18 1108 03/22/18 1115 03/22/18 1121 03/22/18 1215  BP:  126/78    Pulse:  (!) 112    Resp:  19    Temp:    98.3 F (36.8 C)  TempSrc:    Axillary  SpO2: 98% 94% 96%     Constitutional: NAD, calm, comfortable Vitals:   03/22/18 1108 03/22/18 1115 03/22/18 1121 03/22/18 1215  BP:  126/78    Pulse:  (!) 112    Resp:  19    Temp:    98.3 F (36.8 C)  TempSrc:    Axillary  SpO2: 98% 94% 96%    Eyes: PERRL, lids and conjunctivae normal ENMT: Mucous membranes are moist. Posterior pharynx clear of any exudate or lesions.Normal dentition.  Neck: normal, supple, no masses, no thyromegaly Respiratory: Patient is in moderate respiratory distress.  He is 100% nonrebreather.  He is able to finish half sentences, however feels better than earlier.  He has bilateral poor air entry but no added sounds, no wheezing or crepitations. Cardiovascular: Regular rate and rhythm, no murmurs / rubs / gallops. No extremity edema. 2+ pedal pulses. No carotid bruits.  Tachycardic. Abdomen: no tenderness, no masses palpated. No hepatosplenomegaly. Bowel sounds positive.  Obese and pendulous. Musculoskeletal: no clubbing / cyanosis. No joint deformity upper and lower extremities. Good ROM, no contractures. Normal  muscle tone.  Has some venous stasis changes bilateral legs.  Pulses are intact. Skin: no rashes, lesions, ulcers. No induration Neurologic: CN 2-12 grossly intact. Sensation intact, DTR normal. Strength 5/5 in all 4.  Psychiatric: Normal judgment and insight. Alert and oriented x 3. Normal mood.     Labs on Admission: I have personally reviewed following labs and imaging studies  CBC: Recent Labs  Lab 03/22/18 0847  WBC 7.0  HGB 21.4*  HCT 64.5*  MCV 109.7*  PLT 073*   Basic Metabolic Panel: Recent Labs  Lab 03/22/18 1022 03/22/18 1108  NA 134*  --   K 4.7  --   CL 94*  --   CO2 28  --   GLUCOSE 126*  --   BUN 10  --   CREATININE  1.02 1.00  CALCIUM 9.1  --    GFR: CrCl cannot be calculated (Unknown ideal weight.). Liver Function Tests: No results for input(s): AST, ALT, ALKPHOS, BILITOT, PROT, ALBUMIN in the last 168 hours. No results for input(s): LIPASE, AMYLASE in the last 168 hours. No results for input(s): AMMONIA in the last 168 hours. Coagulation Profile: No results for input(s): INR, PROTIME in the last 168 hours. Cardiac Enzymes: No results for input(s): CKTOTAL, CKMB, CKMBINDEX, TROPONINI in the last 168 hours. BNP (last 3 results) No results for input(s): PROBNP in the last 8760 hours. HbA1C: No results for input(s): HGBA1C in the last 72 hours. CBG: No results for input(s): GLUCAP in the last 168 hours. Lipid Profile: No results for input(s): CHOL, HDL, LDLCALC, TRIG, CHOLHDL, LDLDIRECT in the last 72 hours. Thyroid Function Tests: No results for input(s): TSH, T4TOTAL, FREET4, T3FREE, THYROIDAB in the last 72 hours. Anemia Panel: Recent Labs    03/22/18 1022  FERRITIN 224  TIBC 349  IRON 20*   Urine analysis:    Component Value Date/Time   COLORURINE YELLOW 05/30/2010 [place] 05/30/2010 1555   LABSPEC 1.016 05/30/2010 1555   PHURINE 6.0 05/30/2010 1555   GLUCOSEU NEGATIVE 05/30/2010 1555   HGBUR TRACE (A) 05/30/2010  [place] 05/30/2010 [place] 05/30/2010 1555   PROTEINUR 30 (A) 05/30/2010 1555   UROBILINOGEN 0.2 05/30/2010 1555   NITRITE NEGATIVE 05/30/2010 [place] 05/30/2010 1555    Radiological Exams on Admission: Dg Chest Port 1 View  Result Date: 03/22/2018 CLINICAL DATA:  Shortness of Breath EXAM: PORTABLE CHEST 1 VIEW COMPARISON:  Jun 29, 2014 FINDINGS: There is mild cardiomegaly with pulmonary venous hypertension. There is interstitial thickening, likely due to a degree of interstitial edema. No consolidation or pleural effusion evident. No adenopathy. No bone lesions. IMPRESSION: Pulmonary vascular congestion with apparent degree of interstitial edema. No frank consolidation. No adenopathy evident. Electronically Signed   By: Lowella Grip III M.D.   On: 03/22/2018 09:15    EKG: Independently reviewed.  Sinus rhythm.  No acute ST-T wave changes.  Assessment/Plan Principal Problem:   Acute respiratory failure with hypoxia and hypercapnia (HCC) Active Problems:   Polycythemia, secondary   TOBACCO ABUSE   Obstructive sleep apnea   COPD with acute exacerbation (HCC)   Respiratory failure with hypoxia (HCC)   Acute respiratory failure with hypoxia and hypercapnia: With COPD exacerbation and underlying sleep apnea and obesity hypoventilation.  Patient needing high flow oxygen.  Will admit to stepdown unit.  BiPAP as needed and at nights.  Will keep on oxygen to keep saturation more than 90%.  COPD with acute exacerbation: Start on IV steroids, inhalational steroids, aggressive bronchodilator therapy.  Will treat with short course of antibiotics.  Discussed her smoking cessation and provided nicotine patch. I called and discussed case with his pulmonologist.  They will see patient in follow-up.  Polycythemia vera: His hemoglobin is significantly high.  It is 21.  Patient may benefit with phlebotomy, he should follow-up with his  hematologist.  Smoker: Discussed smoking cessation.  Patient thinks he should quit his smoking.  Morbid obesity: We discussed about weight loss and exercise.  He will need long-term outpatient follow-up.  DVT prophylaxis: Subcu heparin. Code Status: Full code. Family Communication: Mother at the bedside. Disposition Plan: Home when is stable. Consults called: Pulmonologist. Admission status: Inpatient to stepdown unit.   Barb Merino MD [facility] Pager [phone]  [phone]  If 7PM-7AM, please contact night-coverage [email or web address] Password [facility]  03/22/2018, 12:22 PM

## 2018-03-23 ENCOUNTER — Inpatient Hospital Stay (HOSPITAL_COMMUNITY): Payer: PPO

## 2018-03-23 DIAGNOSIS — J9691 Respiratory failure, unspecified with hypoxia: Secondary | ICD-10-CM

## 2018-03-23 LAB — BASIC METABOLIC PANEL
Anion gap: 11 (ref 5–15)
BUN: 26 mg/dL — AB (ref 6–20)
CO2: 28 mmol/L (ref 22–32)
Calcium: 9 mg/dL (ref 8.9–10.3)
Chloride: 96 mmol/L — ABNORMAL LOW (ref 98–111)
Creatinine, Ser: 1.15 mg/dL (ref 0.61–1.24)
GFR calc Af Amer: >60 mL/min (ref >60)
GFR calc non Af Amer: >60 mL/min (ref >60)
Glucose, Bld: 214 mg/dL — ABNORMAL HIGH (ref 70–99)
Potassium: 5.1 mmol/L (ref 3.5–5.1)
Sodium: 135 mmol/L (ref 135–145)

## 2018-03-23 LAB — CBC
HCT: 63.9 % — ABNORMAL HIGH (ref 39.0–52.0)
Hemoglobin: 20.3 g/dL — ABNORMAL HIGH (ref 13.0–17.0)
MCH: 35.6 pg — ABNORMAL HIGH (ref 26.0–34.0)
MCHC: 31.8 g/dL (ref 30.0–36.0)
MCV: 111.9 fL — ABNORMAL HIGH (ref 80.0–100.0)
Platelets: DECREASED 10*3/uL (ref 150–400)
RBC: 5.71 MIL/uL (ref 4.22–5.81)
RDW: 13.8 % (ref 11.5–15.5)
WBC: 4.9 10*3/uL (ref 4.0–10.5)
nRBC: 0 % (ref 0.0–0.2)

## 2018-03-23 LAB — BLOOD GAS, ARTERIAL
Acid-Base Excess: 5 mmol/L — ABNORMAL HIGH (ref 0.0–2.0)
Bicarbonate: 31 mmol/L — ABNORMAL HIGH (ref 20.0–28.0)
Delivery systems: POSITIVE
Drawn by: 245131
Expiratory PAP: 6
FIO2: 50
Inspiratory PAP: 20
LHR: 15 {breaths}/min
Mode: POSITIVE
O2 Saturation: 95.1 %
Patient temperature: 98.6
pCO2 arterial: 64.2 mmHg — ABNORMAL HIGH (ref 32.0–48.0)
pH, Arterial: 7.304 — ABNORMAL LOW (ref 7.350–7.450)
pO2, Arterial: 81.3 mmHg — ABNORMAL LOW (ref 83.0–108.0)

## 2018-03-23 LAB — GLUCOSE, CAPILLARY
GLUCOSE-CAPILLARY: 99 mg/dL (ref 70–99)
Glucose-Capillary: 145 mg/dL — ABNORMAL HIGH (ref 70–99)
Glucose-Capillary: 199 mg/dL — ABNORMAL HIGH (ref 70–99)
Glucose-Capillary: 124 mg/dL — ABNORMAL HIGH (ref 70–99)
Glucose-Capillary: 132 mg/dL — ABNORMAL HIGH (ref 70–99)

## 2018-03-23 LAB — ECHOCARDIOGRAM COMPLETE
Height: 72 in
Weight: 5008.85 oz

## 2018-03-23 LAB — RAPID URINE DRUG SCREEN, HOSP PERFORMED
AMPHETAMINES: NOT DETECTED
Barbiturates: NOT DETECTED
Benzodiazepines: POSITIVE — AB
Cocaine: NOT DETECTED
Opiates: NOT DETECTED
Tetrahydrocannabinol: NOT DETECTED

## 2018-03-23 LAB — ALPHA-1-ANTITRYPSIN: A-1 Antitrypsin, Ser: 195 mg/dL — ABNORMAL HIGH (ref 101–187)

## 2018-03-23 LAB — PHOSPHORUS: Phosphorus: 5.1 mg/dL — ABNORMAL HIGH (ref 2.5–4.6)

## 2018-03-23 LAB — MAGNESIUM: Magnesium: 2.1 mg/dL (ref 1.7–2.4)

## 2018-03-23 MED ORDER — TRAZODONE HCL 50 MG PO TABS: 100.0000 mg | ORAL_TABLET | Freq: Every evening | ORAL | Status: DC | PRN

## 2018-03-23 MED ORDER — METHYLPREDNISOLONE SODIUM SUCC 40 MG IJ SOLR
40.0000 mg | Freq: Three times a day (TID) | INTRAMUSCULAR | Status: AC
  Administered 2018-03-23: 40 mg via INTRAVENOUS
  Filled 2018-03-23: qty 1

## 2018-03-23 MED ORDER — UMECLIDINIUM BROMIDE 62.5 MCG/INH IN AEPB
1.0000 | INHALATION_SPRAY | Freq: Every day | RESPIRATORY_TRACT | Status: DC
  Administered 2018-03-23 – 2018-03-24 (×2): 1 via RESPIRATORY_TRACT
  Filled 2018-03-23: qty 7

## 2018-03-23 MED ORDER — TIOTROPIUM BROMIDE MONOHYDRATE 18 MCG IN CAPS: 18.0000 ug | ORAL_CAPSULE | Freq: Every day | RESPIRATORY_TRACT | Status: DC

## 2018-03-23 NOTE — Evaluation (Signed)
Physical Therapy Evaluation Patient Details Name: Billy [name] MRN: [id number] DOB: 12/24/1970 Today's Date: 03/23/2018   History of Present Illness  This is a 48 y.o., male admitted on 03/22/2018 for acute hypoxemic hypercarbic respiratory failure.  Transferred to the intensive care unit after second evaluation on the floor due to minimal responsiveness from receiving Ativan for a alcohol withdrawal protocol and being on BiPAP.  CCM felt as if was necessary to transfer to the intensive care unit due to depressed GCS    Clinical Impression  Pt admitted with above diagnosis. Pt currently with functional limitations due to the deficits listed below (see PT Problem List). PTA pt living with mother at home, no home O2 but reports limited activity tolerance. Independent with mobility. Today patient standing EOB and taking side steps without physical assistance, desat to 80% quickly on 15 HFNC with prolonged coughing. Further OOB mobility deferred. Feel pt would benefit from OP cardiopulmonary rehab. Discussed with pt and family.  Pt will benefit from skilled PT to increase their independence and safety with mobility to allow discharge to the venue listed below.       Follow Up Recommendations Outpatient PT(OP cardiopulmonary PT )    Equipment Recommendations  None recommended by PT    Recommendations for Other Services       Precautions / Restrictions Restrictions Weight Bearing Restrictions: No      Mobility  Bed Mobility Overal bed mobility: Modified Independent                Transfers Overall transfer level: Modified independent               General transfer comment: tolerating side stepping along bed de sat to 80% on 15L HFNC  Ambulation/Gait             General Gait Details: deferred due to desat  Stairs            Wheelchair Mobility    Modified Rankin (Stroke Patients Only)       Balance Overall balance assessment: Needs assistance   Sitting balance-Leahy Scale: Fair       Standing balance-Leahy Scale: Fair                               Pertinent Vitals/Pain Pain Assessment: No/denies pain    Home Living Family/patient expects to be discharged to:: Private residence Living Arrangements: Parent Available Help at Discharge: Family;Available 24 hours/day Type of Home: House Home Access: Stairs to enter Entrance Stairs-Rails: Can reach both Entrance Stairs-Number of Steps: 3 Home Layout: One level Home Equipment: None      Prior Function Level of Independence: Independent         Comments: lives with parent. patient independent at baseline no home O2 but reports short distance tolerance maybe 20 yards before fatigue      Hand Dominance        Extremity/Trunk Assessment   Upper Extremity Assessment Upper Extremity Assessment: Overall WFL for tasks assessed    Lower Extremity Assessment Lower Extremity Assessment: Overall WFL for tasks assessed       Communication      Cognition Arousal/Alertness: Awake/alert Behavior During Therapy: WFL for tasks assessed/performed Overall Cognitive Status: Within Functional Limits for tasks assessed  General Comments      Exercises     Assessment/Plan    PT Assessment Patient needs continued PT services  PT Problem List Decreased strength       PT Treatment Interventions Gait training;DME instruction;Stair training;Functional mobility training;Therapeutic activities    PT Goals (Current goals can be found in the Care Plan section)  Acute Rehab PT Goals Patient Stated Goal: quit smoking and get healtheir PT Goal Formulation: With patient Time For Goal Achievement: 04/06/18 Potential to Achieve Goals: Fair    Frequency Min 3X/week   Barriers to discharge Inaccessible home environment      Co-evaluation               AM-PAC PT "6 Clicks" Mobility  Outcome  Measure Help needed turning from your back to your side while in a flat bed without using bedrails?: None Help needed moving from lying on your back to sitting on the side of a flat bed without using bedrails?: None Help needed moving to and from a bed to a chair (including a wheelchair)?: None Help needed standing up from a chair using your arms (e.g., wheelchair or bedside chair)?: A Little Help needed to walk in hospital room?: A Little Help needed climbing 3-5 steps with a railing? : A Little 6 Click Score: 21    End of Session Equipment Utilized During Treatment: Gait belt Activity Tolerance: Patient tolerated treatment well Patient left: in bed;with call bell/phone within reach Nurse Communication: Mobility status PT Visit Diagnosis: Unsteadiness on feet (R26.81)    Time: 1630-1700 PT Time Calculation (min) (ACUTE ONLY): 30 min   Charges:   PT Evaluation $PT Eval Low Complexity: 1 Low PT Treatments $Gait Training: 8-22 mins        Reinaldo Berber, PT, DPT Acute Rehabilitation Services Pager: [phone] Office: [place] 03/23/2018, 5:07 PM

## 2018-03-23 NOTE — Progress Notes (Signed)
  Echocardiogram 2D Echocardiogram has been performed.  Merrie Roof F 03/23/2018, 3:54 PM

## 2018-03-23 NOTE — Progress Notes (Signed)
Pt was on 4L [place] with Spo2 85%. Pt was placed on 15L HFNC and Spo2 rose to 89%.

## 2018-03-23 NOTE — Progress Notes (Signed)
NAME:  Billy [name], MRN:  [id number], DOB:  10/12/1970, LOS: 1 ADMISSION DATE:  03/22/2018, CONSULTATION DATE:  03/22/2018 REFERRING MD:  Valere Dross, CHIEF COMPLAINT:  Acute on chronic respiratory failure   Brief History   48 year old current every day smoker with COPD , polycythemia, and OSA  nocturnal hypoxemia ( wears CPAP)  with a history of bronchitis 2 weeks ago, with acute decompensation with wheezing and worsening dyspnea over the last 2 days to the point of needing oxygen 24/7 and being too weak to walk. His albuterol inhaler did not relieve his dyspnea. He had an OP appointment with Pulmonary ( Patient of Dr. Lamonte Sakai) day of admission, called the office asking to be seen sooner than noon, and was advised to call 911 and seek emergency care.  Past Medical History   Past Medical History:  Diagnosis Date  . COPD (chronic obstructive pulmonary disease) ([place])   . History of bronchitis   . Obesity   . Polycythemia    a. Dr. Marin Olp.Marland KitchenMarland KitchenJAK-2 analysis is negative.  b. likely physiologic secondary to chronic hypoxia (? OSA + COPD + obesity).  c. s/p plebotomy x1  . PVD (peripheral vascular disease) ([place])   . Sleep apnea    a. mod severe by sleep study 9.30.11: AHI 44.8 per hour; oxygen desat to a nadir of 65%; unsuccessful CPAP titration; significant hypoxemia even with supp o2 at 3LPM (dest to 70-80%); needs eval for cardiopulmonary disease and upper airway obstruction  . Tobacco abuse     Significant Hospital Events   1/24>> Admission  Consults:  PCCM  Procedures:    Significant Diagnostic Tests:  1/24>> Echo  Micro Data:  1/24 Sputum Culture>>  Antimicrobials:     Interim history/subjective:  Wants to eat.  Objective   Blood pressure (!) 93/59, pulse 66, temperature (!) 96.3 F (35.7 C), temperature source Axillary, resp. rate (!) 26, height 6' (1.829 m), weight (!) 142 kg, SpO2 94 %.    Vent Mode: BIPAP FiO2 (%):  [40 %-60 %] 50 % Set Rate:  [15 bmp] 15  bmp PEEP:  [6 cmH20] 6 cmH20   Intake/Output Summary (Last 24 hours) at 03/23/2018 1120 Last data filed at 03/23/2018 0400 Gross per 24 hour  Intake 740 ml  Output 0 ml  Net 740 ml   Filed Weights   03/22/18 1520 03/22/18 1700 03/23/18 0409  Weight: (!) 147.4 kg (!) 142.5 kg (!) 142 kg    Examination:  General - alert Eyes - pupils reactive ENT - no sinus tenderness, no stridor Cardiac - regular rate/rhythm, no murmur Chest - equal breath sounds b/l, no wheezing or rales Abdomen - soft, non tender, + bowel sounds Extremities - no cyanosis, clubbing, or edema Skin - no rashes Lymphatics - no lymphadenopathy Neuro - normal strength, moves extremities, follows commands Psych - normal mood and behavior     Resolved Hospital Problem list     Assessment & Plan:   Acute on chronic hypoxic, hypercapnic respiratory failure. COPD exacerbation. OSA. Tobacco, THC abuse. Plan - breo, spiriva, prn albuterol - change solumedrol to 40 mg q8h - CPAP qhs - oxygen to keep SpO2 90 to 95%  Acute on chronic diastolic CHF. Plan - f/u Echo  Polycythemia likely from hypoxia. Plan - f/u CBC  ETOH Drinks 14 beers per day Plan: - precedex as needed   Best practice:  Diet: regular diet DVT prophylaxis: lovenox GI prophylaxis: not indicated Mobility: OOB Code Status: Full Family Communication:  updated family at bedside  Labs   CBC: Recent Labs  Lab 03/22/18 0847 03/23/18 0702  WBC 7.0 4.9  HGB 21.4* 20.3*  HCT 64.5* 63.9*  MCV 109.7* 111.9*  PLT 146* PLATELET CLUMPS NOTED ON SMEAR, COUNT APPEARS DECREASED    Basic Metabolic Panel: Recent Labs  Lab 03/22/18 1022 03/22/18 1108 03/23/18 0702  NA 134*  --  135  K 4.7  --  5.1  CL 94*  --  96*  CO2 28  --  28  GLUCOSE 126*  --  214*  BUN 10  --  26*  CREATININE 1.02 1.00 1.15  CALCIUM 9.1  --  9.0  MG  --   --  2.1  PHOS  --   --  5.1*   GFR: Estimated Creatinine Clearance: 114.9 mL/min (by C-G formula  based on SCr of 1.15 mg/dL). Recent Labs  Lab 03/22/18 0847 03/23/18 0702  WBC 7.0 4.9    Liver Function Tests: No results for input(s): AST, ALT, ALKPHOS, BILITOT, PROT, ALBUMIN in the last 168 hours. No results for input(s): LIPASE, AMYLASE in the last 168 hours. Recent Labs  Lab 03/22/18 1834  AMMONIA 43*    ABG    Component Value Date/Time   PHART 7.304 (L) 03/23/2018 0330   PCO2ART 64.2 (H) 03/23/2018 0330   PO2ART 81.3 (L) 03/23/2018 0330   HCO3 31.0 (H) 03/23/2018 0330   TCO2 35.3 11/20/2008 1055   O2SAT 95.1 03/23/2018 0330     Coagulation Profile: No results for input(s): INR, PROTIME in the last 168 hours.  Cardiac Enzymes: No results for input(s): CKTOTAL, CKMB, CKMBINDEX, TROPONINI in the last 168 hours.  HbA1C: Hgb A1c MFr Bld  Date/Time Value Ref Range Status  07/31/2017 01:40 PM 5.6 4.6 - 6.5 % Final    Comment:    Glycemic Control Guidelines for People with Diabetes:Non Diabetic:  <6%Goal of Therapy: <7%Additional Action Suggested:  >8%     CBG: Recent Labs  Lab 03/22/18 1755 03/22/18 1928 03/23/18 0336 03/23/18 [place], MD [place] 03/23/2018, 11:24 AM

## 2018-03-24 LAB — CBC
HCT: 64.7 % — ABNORMAL HIGH (ref 39.0–52.0)
Hemoglobin: 20.6 g/dL — ABNORMAL HIGH (ref 13.0–17.0)
MCH: 36 pg — AB (ref 26.0–34.0)
MCHC: 31.8 g/dL (ref 30.0–36.0)
MCV: 113.1 fL — ABNORMAL HIGH (ref 80.0–100.0)
Platelets: 110 10*3/uL — ABNORMAL LOW (ref 150–400)
RBC: 5.72 MIL/uL (ref 4.22–5.81)
RDW: 13.6 % (ref 11.5–15.5)
WBC: 10.6 10*3/uL — ABNORMAL HIGH (ref 4.0–10.5)
nRBC: 0 % (ref 0.0–0.2)

## 2018-03-24 LAB — BASIC METABOLIC PANEL
Anion gap: 9 (ref 5–15)
BUN: 34 mg/dL — ABNORMAL HIGH (ref 6–20)
CO2: 37 mmol/L — ABNORMAL HIGH (ref 22–32)
Calcium: 9.7 mg/dL (ref 8.9–10.3)
Chloride: 90 mmol/L — ABNORMAL LOW (ref 98–111)
Creatinine, Ser: 1.22 mg/dL (ref 0.61–1.24)
GFR calc Af Amer: >60 mL/min (ref >60)
GFR calc non Af Amer: >60 mL/min (ref >60)
GLUCOSE: 107 mg/dL — AB (ref 70–99)
Potassium: 5.7 mmol/L — ABNORMAL HIGH (ref 3.5–5.1)
Sodium: 136 mmol/L (ref 135–145)

## 2018-03-24 LAB — GLUCOSE, CAPILLARY: Glucose-Capillary: 94 mg/dL (ref 70–99)

## 2018-03-24 MED ORDER — PREDNISONE 50 MG PO TABS
50.0000 mg | ORAL_TABLET | Freq: Every day | ORAL | Status: DC
  Administered 2018-03-24 – 2018-03-27 (×4): 50 mg via ORAL
  Filled 2018-03-24 (×4): qty 1

## 2018-03-24 MED ORDER — FUROSEMIDE 10 MG/ML IJ SOLN
40.0000 mg | Freq: Once | INTRAMUSCULAR | Status: AC
  Administered 2018-03-24: 40 mg via INTRAVENOUS
  Filled 2018-03-24: qty 4

## 2018-03-24 MED ORDER — AZITHROMYCIN 500 MG PO TABS
250.0000 mg | ORAL_TABLET | Freq: Every day | ORAL | Status: DC
  Administered 2018-03-25 – 2018-03-27 (×3): 250 mg via ORAL
  Filled 2018-03-24 (×3): qty 1

## 2018-03-24 MED ORDER — IPRATROPIUM-ALBUTEROL 0.5-2.5 (3) MG/3ML IN SOLN
3.0000 mL | Freq: Four times a day (QID) | RESPIRATORY_TRACT | Status: DC
  Administered 2018-03-24 – 2018-03-26 (×9): 3 mL via RESPIRATORY_TRACT
  Filled 2018-03-24 (×13): qty 3

## 2018-03-24 MED ORDER — CHLORHEXIDINE GLUCONATE CLOTH 2 % EX PADS
6.0000 | MEDICATED_PAD | Freq: Every day | CUTANEOUS | Status: DC
  Administered 2018-03-25 – 2018-03-27 (×2): 6 via TOPICAL

## 2018-03-24 MED ORDER — AZITHROMYCIN 500 MG PO TABS
500.0000 mg | ORAL_TABLET | Freq: Every day | ORAL | Status: AC
  Administered 2018-03-24: 500 mg via ORAL
  Filled 2018-03-24: qty 1

## 2018-03-24 NOTE — Progress Notes (Addendum)
NAME:  Billy [name], MRN:  [id number], DOB:  1970-08-27, LOS: 2 ADMISSION DATE:  03/22/2018, CONSULTATION DATE:  03/22/2018 REFERRING MD:  Valere Dross, CHIEF COMPLAINT:  Acute on chronic respiratory failure   Brief History   48 year old current every day smoker with COPD , polycythemia, and OSA  nocturnal hypoxemia ( wears CPAP)  with a history of bronchitis 2 weeks ago, with acute decompensation with wheezing and worsening dyspnea over the last 2 days to the point of needing oxygen 24/7 and being too weak to walk. His albuterol inhaler did not relieve his dyspnea. He had an OP appointment with Pulmonary ( Patient of Dr. Lamonte Sakai) day of admission, called the office asking to be seen sooner than noon, and was advised to call 911 and seek emergency care.  Past Medical History   Past Medical History:  Diagnosis Date  . COPD (chronic obstructive pulmonary disease) ([place])   . History of bronchitis   . Obesity   . Polycythemia    a. Dr. Marin Olp.Marland KitchenMarland KitchenJAK-2 analysis is negative.  b. likely physiologic secondary to chronic hypoxia (? OSA + COPD + obesity).  c. s/p plebotomy x1  . PVD (peripheral vascular disease) ([place])   . Sleep apnea    a. mod severe by sleep study 9.30.11: AHI 44.8 per hour; oxygen desat to a nadir of 65%; unsuccessful CPAP titration; significant hypoxemia even with supp o2 at 3LPM (dest to 70-80%); needs eval for cardiopulmonary disease and upper airway obstruction  . Tobacco abuse     Significant Hospital Events   1/24>> Admission  Consults:  PCCM  Procedures:    Significant Diagnostic Tests:  1/24>> Echo  Micro Data:  1/24 Sputum Culture>>  Antimicrobials:     Interim history/subjective:  Patient was feeling better when seen this morning.  He was concerned about the saturation with mild exertion.  He was also concerned about the change in his inhalers as he thinks that his home inhalers works better for him.  Objective   Blood pressure 125/81, pulse 79,  temperature (!) 96.7 F (35.9 C), temperature source Axillary, resp. rate 18, height 6' (1.829 m), weight (!) 143 kg, SpO2 99 %.        Intake/Output Summary (Last 24 hours) at 03/24/2018 1107 Last data filed at 03/24/2018 1035 Gross per 24 hour  Intake 723.27 ml  Output 2550 ml  Net -1826.73 ml   Filed Weights   03/22/18 1700 03/23/18 0409 03/24/18 0412  Weight: (!) 142.5 kg (!) 142 kg (!) 143 kg    Examination:  General -well-developed, morbidly obese gentleman, in no acute distress.   Eyes - pupils reactive ENT - no sinus tenderness, no stridor Cardiac - regular rate/rhythm, no murmur Chest - equal breath sounds b/l, no wheezing or rales Abdomen - soft, non tender, + bowel sounds Extremities - no cyanosis, clubbing, or edema Skin - no rashes Lymphatics - no lymphadenopathy Neuro -AXO x3 normal and symmetrical strength and sensations,  Psych - normal mood and behavior  Resolved Hospital Problem list     Assessment & Plan:   Acute on chronic hypoxic, hypercapnic respiratory failure. COPD exacerbation. OSA. Tobacco, THC abuse. Saturating well on 3 to 4 L, was on CPAP overnight.  Not required any Precedex. Plan. -Discontinue inhalers. -Start him on scheduled DuoNeb with as needed albuterol. -Prednisone 50 mg daily for 5 days. -Azithromycin for 5 days. - CPAP qhs - oxygen to keep SpO2 90 to 95% -Can be transferred to telemetry.  Acute  on chronic diastolic CHF. Plan -  Echo-was suboptimal with normal ejection fraction. -Give another dose of Lasix 40 mg today.  Polycythemia likely from hypoxia. Plan - f/u CBC  ETOH Drinks 14 beers per day Plan: -CIWA protocol. -Add IV Ativan, try p.o. at a lower dose if needed.  Best practice:  Diet: regular diet DVT prophylaxis: lovenox GI prophylaxis: not indicated Mobility: OOB Code Status: Full Family Communication: updated family at bedside  Labs   CBC: Recent Labs  Lab 03/22/18 0847 03/23/18 0702  03/24/18 0658  WBC 7.0 4.9 10.6*  HGB 21.4* 20.3* 20.6*  HCT 64.5* 63.9* 64.7*  MCV 109.7* 111.9* 113.1*  PLT 146* PLATELET CLUMPS NOTED ON SMEAR, COUNT APPEARS DECREASED 110*    Basic Metabolic Panel: Recent Labs  Lab 03/22/18 1022 03/22/18 1108 03/23/18 0702 03/24/18 0658  NA 134*  --  135 136  K 4.7  --  5.1 5.7*  CL 94*  --  96* 90*  CO2 28  --  28 37*  GLUCOSE 126*  --  214* 107*  BUN 10  --  26* 34*  CREATININE 1.02 1.00 1.15 1.22  CALCIUM 9.1  --  9.0 9.7  MG  --   --  2.1  --   PHOS  --   --  5.1*  --    GFR: Estimated Creatinine Clearance: 108.7 mL/min (by C-G formula based on SCr of 1.22 mg/dL). Recent Labs  Lab 03/22/18 0847 03/23/18 0702 03/24/18 0658  WBC 7.0 4.9 10.6*    Liver Function Tests: No results for input(s): AST, ALT, ALKPHOS, BILITOT, PROT, ALBUMIN in the last 168 hours. No results for input(s): LIPASE, AMYLASE in the last 168 hours. Recent Labs  Lab 03/22/18 1834  AMMONIA 43*    ABG    Component Value Date/Time   PHART 7.304 (L) 03/23/2018 0330   PCO2ART 64.2 (H) 03/23/2018 0330   PO2ART 81.3 (L) 03/23/2018 0330   HCO3 31.0 (H) 03/23/2018 0330   TCO2 35.3 11/20/2008 1055   O2SAT 95.1 03/23/2018 0330     Coagulation Profile: No results for input(s): INR, PROTIME in the last 168 hours.  Cardiac Enzymes: No results for input(s): CKTOTAL, CKMB, CKMBINDEX, TROPONINI in the last 168 hours.  HbA1C: Hgb A1c MFr Bld  Date/Time Value Ref Range Status  07/31/2017 01:40 PM 5.6 4.6 - 6.5 % Final    Comment:    Glycemic Control Guidelines for People with Diabetes:Non Diabetic:  <6%Goal of Therapy: <7%Additional Action Suggested:  >8%     CBG: Recent Labs  Lab 03/23/18 0740 03/23/18 1150 03/23/18 1637 03/23/18 1928 03/24/18 [name]   [name] MD PGY3 Pager [phone] 03/24/2018, 11:07 AM  Attending portion: Still short of breath with activity.  Needing higher amounts of oxygen.  Breo,  incruse don't seem to work as well.  BP 125/81   Pulse 79   Temp 98.3 F (36.8 C) (Oral)   Resp 18   Ht 6' (1.829 m)   Wt (!) 143 kg   SpO2 99%   BMI 42.76 kg/m   Alert.  Hr regular.  Decreased BS.  Abdomen soft.  1+ edema.  A/p   Acute on chronic respiratory failure COPD exacerbation OSA/OHS Pulmonary edema - prednisone, zithromax - duoneb, pulmicort - CPAP qhs - oxygen to keep SpO2 90 to 95% - lasix  Polycythemia. - f/u CBC - used to get phlebotomy >> if Hb remains elevated, then might need to consider  this again  Transfer to progressive care 1/26.  To [facility] 1/27.  PCCM will follow as consult.  Chesley Mires, MD [facility] Pulmonary/Critical Care 03/24/2018, 12:26 PM

## 2018-03-25 LAB — CBC
HCT: 63.2 % — ABNORMAL HIGH (ref 39.0–52.0)
Hemoglobin: 20.1 g/dL — ABNORMAL HIGH (ref 13.0–17.0)
MCH: 36 pg — ABNORMAL HIGH (ref 26.0–34.0)
MCHC: 31.8 g/dL (ref 30.0–36.0)
MCV: 113.1 fL — ABNORMAL HIGH (ref 80.0–100.0)
Platelets: 99 10*3/uL — ABNORMAL LOW (ref 150–400)
RBC: 5.59 MIL/uL (ref 4.22–5.81)
RDW: 13.8 % (ref 11.5–15.5)
WBC: 7.6 10*3/uL (ref 4.0–10.5)
nRBC: 0 % (ref 0.0–0.2)

## 2018-03-25 LAB — BASIC METABOLIC PANEL
Anion gap: 9 (ref 5–15)
BUN: 30 mg/dL — ABNORMAL HIGH (ref 6–20)
CO2: 38 mmol/L — ABNORMAL HIGH (ref 22–32)
Calcium: 9 mg/dL (ref 8.9–10.3)
Chloride: 91 mmol/L — ABNORMAL LOW (ref 98–111)
Creatinine, Ser: 1.17 mg/dL (ref 0.61–1.24)
GFR calc Af Amer: >60 mL/min (ref >60)
GFR calc non Af Amer: >60 mL/min (ref >60)
Glucose, Bld: 95 mg/dL (ref 70–99)
Potassium: 4.4 mmol/L (ref 3.5–5.1)
Sodium: 138 mmol/L (ref 135–145)

## 2018-03-25 MED ORDER — FUROSEMIDE 10 MG/ML IJ SOLN
40.0000 mg | Freq: Once | INTRAMUSCULAR | Status: AC
  Administered 2018-03-25: 40 mg via INTRAVENOUS
  Filled 2018-03-25: qty 4

## 2018-03-25 MED ORDER — ACETAMINOPHEN 325 MG PO TABS: 650.0000 mg | ORAL_TABLET | Freq: Four times a day (QID) | ORAL | Status: DC | PRN

## 2018-03-25 NOTE — Progress Notes (Signed)
Patient spiked temp of 101, Tylenol 650 mg oral as needed administered. Rechecked was 98.2. Will continue to monitor

## 2018-03-25 NOTE — Progress Notes (Signed)
Patient c/o water coming out of  his tubing during report. Called [facility] to notify to bring a new green tubing since we don't have it on our unit.

## 2018-03-25 NOTE — Progress Notes (Signed)
NAME:  Billy [name], MRN:  [id number], DOB:  Mar 10, 1970, LOS: 3 ADMISSION DATE:  03/22/2018, CONSULTATION DATE:  03/22/2018 REFERRING MD:  Valere Dross, CHIEF COMPLAINT:  Acute on chronic respiratory failure   Brief History   48 year old current every day smoker with COPD , polycythemia, and OSA  nocturnal hypoxemia ( wears CPAP)  with a history of bronchitis 2 weeks ago, with acute decompensation with wheezing and worsening dyspnea over the last 2 days to the point of needing oxygen 24/7 and being too weak to walk. His albuterol inhaler did not relieve his dyspnea. He had an OP appointment with Pulmonary ( Patient of Dr. Lamonte Sakai) day of admission, called the office asking to be seen sooner than noon, and was advised to call 911 and seek emergency care.  Past Medical History  COPD OSA - moderate, AHI 44.8 per hour, sig desaturation despite O2 Obesity  Polycythemia  Smoker   Significant Hospital Events   1/24  Admission  Consults:  PCCM  Procedures:    Significant Diagnostic Tests:  ECHO 1/24 >> poor images, LV normal size, moderate LVH, LVEF 60-65%  Micro Data:  Sputum 1/24 >>   Antimicrobials:     Interim history/subjective:  Pt reports "confusion on his meds, states pharmacy called and meds are ready but not covered by his insurance".  Reports he has to go to the bathroom.    Objective   Blood pressure 127/84, pulse (!) 109, temperature 98.2 F (36.8 C), temperature source Oral, resp. rate (!) 23, height 6\' 1"  (1.854 m), weight (!) 149.2 kg, SpO2 93 %.        Intake/Output Summary (Last 24 hours) at 03/25/2018 0900 Last data filed at 03/25/2018 0435 Gross per 24 hour  Intake -  Output 2050 ml  Net -2050 ml   Filed Weights   03/23/18 0409 03/24/18 0412 03/24/18 1829  Weight: (!) 142 kg (!) 143 kg (!) 149.2 kg    Examination: General: obese adult male sitting at bedside in NAD   HEENT: MM pink/moist Neuro: AAOx4, speech clear, MAE CV: s1s2 rrr, no m/r/g PULM:  even/non-labored, lungs bilaterally diminished but clear, no wheezing  VV:OHYW, non-tender, bsx4 active  Extremities: warm/dry, BLE trace to 1+ pitting edema, changes consistent with chronic venous stasis   Skin: no rashes or lesions  Resolved Hospital Problem list     Assessment & Plan:   Acute on chronic hypoxic, hypercapnic respiratory failure. Acute COPD Exacerbation. OSA. Tobacco, THC abuse. P: Continue azithromycin for 5 days total  Duoneb Q6 with PRN albuterol  Prednisone 50 mg QD x 5 days  Continue QHS CPAP + PRN daytime sleep  Wean O2 for sats 88-95% Smoking cessation counseling  Discharge med plan: complete above abx/prednisone, continue symbicort, spiriva & PRN albuterol.  If formulary change, may need to change pending insurance review.   Acute on chronic diastolic CHF -ECHO with suboptimal windows, LVEF normal  P: Lasix 40 mg IV x1  Polycythemia likely from hypoxia. P: Trend CBC   ETOH Drinks 14 beers per day P: Monitor for withdrawal symptoms  Best practice:  Diet: regular diet DVT prophylaxis: lovenox GI prophylaxis: not indicated Mobility: OOB Code Status: Full Family Communication: patient updated on plan of care   Labs   CBC: Recent Labs  Lab 03/22/18 0847 03/23/18 0702 03/24/18 0658 03/25/18 0359  WBC 7.0 4.9 10.6* 7.6  HGB 21.4* 20.3* 20.6* 20.1*  HCT 64.5* 63.9* 64.7* 63.2*  MCV 109.7* 111.9* 113.1* 113.1*  PLT 146* PLATELET CLUMPS NOTED ON SMEAR, COUNT APPEARS DECREASED 110* 99*    Basic Metabolic Panel: Recent Labs  Lab 03/22/18 1022 03/22/18 1108 03/23/18 0702 03/24/18 0658 03/25/18 0359  NA 134*  --  135 136 138  K 4.7  --  5.1 5.7* 4.4  CL 94*  --  96* 90* 91*  CO2 28  --  28 37* 38*  GLUCOSE 126*  --  214* 107* 95  BUN 10  --  26* 34* 30*  CREATININE 1.02 1.00 1.15 1.22 1.17  CALCIUM 9.1  --  9.0 9.7 9.0  MG  --   --  2.1  --   --   PHOS  --   --  5.1*  --   --    GFR: Estimated Creatinine Clearance: 117.5  mL/min (by C-G formula based on SCr of 1.17 mg/dL). Recent Labs  Lab 03/22/18 0847 03/23/18 0702 03/24/18 0658 03/25/18 0359  WBC 7.0 4.9 10.6* 7.6    Liver Function Tests: No results for input(s): AST, ALT, ALKPHOS, BILITOT, PROT, ALBUMIN in the last 168 hours. No results for input(s): LIPASE, AMYLASE in the last 168 hours. Recent Labs  Lab 03/22/18 1834  AMMONIA 43*    ABG    Component Value Date/Time   PHART 7.304 (L) 03/23/2018 0330   PCO2ART 64.2 (H) 03/23/2018 0330   PO2ART 81.3 (L) 03/23/2018 0330   HCO3 31.0 (H) 03/23/2018 0330   TCO2 35.3 11/20/2008 1055   O2SAT 95.1 03/23/2018 0330     Coagulation Profile: No results for input(s): INR, PROTIME in the last 168 hours.  Cardiac Enzymes: No results for input(s): CKTOTAL, CKMB, CKMBINDEX, TROPONINI in the last 168 hours.  HbA1C: Hgb A1c MFr Bld  Date/Time Value Ref Range Status  07/31/2017 01:40 PM 5.6 4.6 - 6.5 % Final    Comment:    Glycemic Control Guidelines for People with Diabetes:Non Diabetic:  <6%Goal of Therapy: <7%Additional Action Suggested:  >8%     CBG: Recent Labs  Lab 03/23/18 0740 03/23/18 1150 03/23/18 1637 03/23/18 1928 03/24/18 0745  GLUCAP 199* 99 [place], NP-C  Pulmonary & Critical Care Pgr: [phone] or if no answer [phone] 03/25/2018, 9:00 AM

## 2018-03-25 NOTE — Progress Notes (Signed)
Physical Therapy Treatment Patient Details Name: Billy [name] MRN: [id number] DOB: May 01, 1970 Today's Date: 03/25/2018    History of Present Illness This is a 48 y.o., male admitted on 03/22/2018 for acute hypoxemic hypercarbic respiratory failure.  Transferred to the intensive care unit after second evaluation on the floor due to minimal responsiveness from receiving Ativan for a alcohol withdrawal protocol and being on BiPAP.  CCM felt as if was necessary to transfer to the intensive care unit due to depressed GCS    PT Comments    Pt progressing well but continues to require a large amount of supplemental O2 to perform functional activities.  Pt limited due to desaturation with activity.  Pt on 10 L HFNC during session and O2 ranged from 88%-93%.  Plan next session for progression of activity with emphasis on energy conservation and pacing.  Pt continues to benefit from out patient cardiopulmonary rehab.      Follow Up Recommendations  Outpatient PT(cardiopulmonary)     Equipment Recommendations  None recommended by PT    Recommendations for Other Services       Precautions / Restrictions Restrictions Weight Bearing Restrictions: No    Mobility  Bed Mobility                  Transfers Overall transfer level: Modified independent Equipment used: Rolling walker (2 wheeled)             General transfer comment: Pt performed without assistance and no physical assistance needed.    Ambulation/Gait Ambulation/Gait assistance: Supervision Gait Distance (Feet): 200 Feet Assistive device: Rolling walker (2 wheeled) Gait Pattern/deviations: Step-through pattern;Trunk flexed     General Gait Details: Cues for upper trunk control. Cues for pacing and pursed lip breathing.  Pt on 10 L HFNC.  88%-93% during ambulation.     Stairs             Wheelchair Mobility    Modified Rankin (Stroke Patients Only)       Balance     Sitting balance-Leahy  Scale: Fair       Standing balance-Leahy Scale: Fair                              Cognition Arousal/Alertness: Awake/alert Behavior During Therapy: WFL for tasks assessed/performed Overall Cognitive Status: Within Functional Limits for tasks assessed                                        Exercises      General Comments        Pertinent Vitals/Pain Pain Assessment: No/denies pain    Home Living                      Prior Function            PT Goals (current goals can now be found in the care plan section) Acute Rehab PT Goals Patient Stated Goal: quit smoking and get healtheir Potential to Achieve Goals: Fair Progress towards PT goals: Progressing toward goals    Frequency    Min 3X/week      PT Plan Current plan remains appropriate    Co-evaluation              AM-PAC PT "6 Clicks" Mobility   Outcome Measure  Help needed turning from your  back to your side while in a flat bed without using bedrails?: None Help needed moving from lying on your back to sitting on the side of a flat bed without using bedrails?: None Help needed moving to and from a bed to a chair (including a wheelchair)?: None Help needed standing up from a chair using your arms (e.g., wheelchair or bedside chair)?: A Little Help needed to walk in hospital room?: A Little Help needed climbing 3-5 steps with a railing? : A Little 6 Click Score: 21    End of Session Equipment Utilized During Treatment: Gait belt Activity Tolerance: Patient tolerated treatment well Patient left: with call bell/phone within reach;in chair Nurse Communication: Mobility status PT Visit Diagnosis: Unsteadiness on feet (R26.81)     Time: 2355-7322 PT Time Calculation (min) (ACUTE ONLY): 19 min  Charges:  $Gait Training: 8-22 mins                     Governor Rooks, PTA Acute Rehabilitation Services Pager [phone] Office [phone]     Niomie Englert Eli Hose 03/25/2018, 4:50 PM

## 2018-03-25 NOTE — Progress Notes (Signed)
[place] TEAM 1 - Stepdown/ICU TEAM  Quinton Voth  OFB:[id number] DOB: [birth year] DOA: 03/22/2018 PCP: Dorothyann Peng, NP    Brief Narrative:  48 year old current every day smoker with COPD, secondary polycythemia, and OSA (wears CPAP) who presented w/ severe SOB which rapidly worsened over a 2 day period.  Subjective: All active issues were addressed by PCCM today. [facility] will not charge the pt for a visit today.   Assessment & Plan:  Acute on chronic hypoxic & hypercapnic respiratory failure - acute COPD exacerbation - Tobacco abuse   OSA  Acute on chronic diastolic CHF Normal EF on TTE this admit   Filed Weights   03/23/18 0409 03/24/18 0412 03/24/18 1829  Weight: (!) 142 kg (!) 143 kg (!) 149.2 kg    Polycythemia Hx of phelebotomy - will need to be followed in outpt setting   ETOH abuse  Reportedly drinks 14 beers per day - care w/ sedatives   DVT prophylaxis: lovenox Code Status: FULL CODE Family Communication: no family present at time of exam  Disposition Plan:   Consultants:  PCCM   Antimicrobials:  Azithromycin 1/26 > Rocephin 1/24   Objective: Blood pressure 121/72, pulse 93, temperature 98 F (36.7 C), temperature source Oral, resp. rate (!) 22, height 6\' 1"  (1.854 m), weight (!) 149.2 kg, SpO2 90 %.  Intake/Output Summary (Last 24 hours) at 03/25/2018 1729 Last data filed at 03/25/2018 1300 Gross per 24 hour  Intake -  Output 1800 ml  Net -1800 ml   Filed Weights   03/23/18 0409 03/24/18 0412 03/24/18 1829  Weight: (!) 142 kg (!) 143 kg (!) 149.2 kg    Examination:   CBC: Recent Labs  Lab 03/23/18 0702 03/24/18 0658 03/25/18 0359  WBC 4.9 10.6* 7.6  HGB 20.3* 20.6* 20.1*  HCT 63.9* 64.7* 63.2*  MCV 111.9* 113.1* 113.1*  PLT PLATELET CLUMPS NOTED ON SMEAR, COUNT APPEARS DECREASED 110* 99*   Basic Metabolic Panel: Recent Labs  Lab 03/23/18 0702 03/24/18 0658 03/25/18 0359  NA 135 136 138  K 5.1 5.7* 4.4  CL 96* 90* 91*   CO2 28 37* 38*  GLUCOSE 214* 107* 95  BUN 26* 34* 30*  CREATININE 1.15 1.22 1.17  CALCIUM 9.0 9.7 9.0  MG 2.1  --   --   PHOS 5.1*  --   --    GFR: Estimated Creatinine Clearance: 117.5 mL/min (by C-G formula based on SCr of 1.17 mg/dL).  Liver Function Tests: No results for input(s): AST, ALT, ALKPHOS, BILITOT, PROT, ALBUMIN in the last 168 hours. No results for input(s): LIPASE, AMYLASE in the last 168 hours. Recent Labs  Lab 03/22/18 1834  AMMONIA 43*    HbA1C: Hgb A1c MFr Bld  Date/Time Value Ref Range Status  07/31/2017 01:40 PM 5.6 4.6 - 6.5 % Final    Comment:    Glycemic Control Guidelines for People with Diabetes:Non Diabetic:  <6%Goal of Therapy: <7%Additional Action Suggested:  >8%     CBG: Recent Labs  Lab 03/23/18 0740 03/23/18 1150 03/23/18 1637 03/23/18 1928 03/24/18 0745  GLUCAP 199* 99 124* 132* 94    Recent Results (from the past 240 hour(s))  MRSA PCR Screening     Status: None   Collection Time: 03/22/18  2:25 PM  Result Value Ref Range Status   MRSA by PCR NEGATIVE NEGATIVE Final    Comment:        The GeneXpert MRSA Assay (FDA approved for NASAL specimens  only), is one component of a comprehensive MRSA colonization surveillance program. It is not intended to diagnose MRSA infection nor to guide or monitor treatment for MRSA infections. Performed at [facility], [place] [address] [place], [place] [address]      Scheduled Meds: . aspirin  162 mg Oral Daily  . azithromycin  250 mg Oral Daily  . Chlorhexidine Gluconate Cloth  6 each Topical Q0600  . enoxaparin (LOVENOX) injection  40 mg Subcutaneous Q24H  . folic acid  1 mg Intravenous Daily   Or  . folic acid  1 mg Oral Daily  . Influenza vac split quadrivalent PF  0.5 mL Intramuscular Tomorrow-1000  . ipratropium-albuterol  3 mL Nebulization Q6H  . multivitamin with minerals  1 tablet Oral Daily  . nicotine  21 mg Transdermal Daily  . pneumococcal 23 valent vaccine  0.5  mL Intramuscular Tomorrow-1000  . predniSONE  50 mg Oral Q breakfast  . thiamine  100 mg Oral Daily     LOS: 3 days   Cherene Altes, MD [facility] Office  [phone] Pager - Text Page per Amion  If 7PM-7AM, please contact night-coverage per Amion 03/25/2018, 5:29 PM

## 2018-03-25 NOTE — Progress Notes (Signed)
CCMD called that patient was off the monitor. Went in and observed that  patient  has urinated on the floor. According to him it was an accident. This RN and Percell Locus NT cleaned him up, wiped the floor and changed his linens.  Patient upset that he's wired with cord everywhere and can't move to do anything. This RN explained to patient the wires and cords he was referring to are the CPAP and the cardac monitoring and he needed them. Patient is impatient about stuff. No acute distress noted.

## 2018-03-25 NOTE — Evaluation (Signed)
Occupational Therapy Evaluation Patient Details Name: Billy [name] MRN: [id number] DOB: 1971-02-12 Today's Date: 03/25/2018    History of Present Illness This is a 47 y.o., male admitted on 03/22/2018 for acute hypoxemic hypercarbic respiratory failure.  Transferred to the intensive care unit after second evaluation on the floor due to minimal responsiveness from receiving Ativan for a alcohol withdrawal protocol and being on BiPAP.  CCM felt as if was necessary to transfer to the intensive care unit due to depressed GCS   Clinical Impression   Pt admitted with above. He demonstrates the below listed deficits and will benefit from continued OT to maximize safety and independence with BADLs.  Pt presents to OT with decreased activity tolerance.   He is able to to perform UB ADLs with supervision/set up and LB with min A due to decreased endurance.  Pt on 10L hiflo 02 and sats dropped to 86% with minimal activity, then required increased time to recover to 89-90%.  Pt instructed in pursed lip breathing and pacing.  He lives with his mom and was fully independent PTA. Will follow acutely.       Follow Up Recommendations  Other (comment)(pulmonary rehab )    Equipment Recommendations       Recommendations for Other Services       Precautions / Restrictions Restrictions Weight Bearing Restrictions: No      Mobility Bed Mobility Overal bed mobility: Modified Independent                Transfers Overall transfer level: Needs assistance Equipment used: None Transfers: Sit to/from Stand;Stand Pivot Transfers Sit to Stand: Modified independent (Device/Increase time) Stand pivot transfers: Supervision       General transfer comment: supervision for line management and for encouragement of pursed lip breathing     Balance Overall balance assessment: Needs assistance Sitting-balance support: Feet supported Sitting balance-Leahy Scale: Good     Standing balance  support: No upper extremity supported Standing balance-Leahy Scale: Fair                             ADL either performed or assessed with clinical judgement   ADL Overall ADL's : Needs assistance/impaired Eating/Feeding: Independent   Grooming: Wash/dry hands;Wash/dry face;Oral care;Brushing hair;Set up;Sitting   Upper Body Bathing: Supervision/ safety;Sitting   Lower Body Bathing: Minimal assistance;Sit to/from stand   Upper Body Dressing : Set up;Sitting   Lower Body Dressing: Minimal assistance;Sit to/from stand   Toilet Transfer: Supervision/safety;Stand-pivot;BSC   Toileting- Water quality scientist and Hygiene: Supervision/safety;Sit to/from stand       Functional mobility during ADLs: Supervision/safety       Vision         Perception     Praxis      Pertinent Vitals/Pain Pain Assessment: No/denies pain     Hand Dominance Right   Extremity/Trunk Assessment Upper Extremity Assessment Upper Extremity Assessment: Overall WFL for tasks assessed   Lower Extremity Assessment Lower Extremity Assessment: Overall WFL for tasks assessed   Cervical / Trunk Assessment Cervical / Trunk Assessment: Normal   Communication     Cognition Arousal/Alertness: Awake/alert Behavior During Therapy: WFL for tasks assessed/performed Overall Cognitive Status: Within Functional Limits for tasks assessed                                 General Comments: Pt initially irritable    General  Comments  Pt on 10L hiflo 02.  02 sats decreased to 86% when he moved to EOB, and required ~5-6 mins to increase to 89-90%.  Pt instructed in pursed lip breathing and pacing.  He transferred to recliner, and again sats dropped to 86% - RN notified     Exercises     Shoulder Instructions      Home Living Family/patient expects to be discharged to:: Private residence Living Arrangements: Parent Available Help at Discharge: Family;Available 24 hours/day Type of  Home: House Home Access: Stairs to enter [employer] of Steps: 3 Entrance Stairs-Rails: Can reach both Home Layout: One level     Bathroom Shower/Tub: Teacher, early years/pre: Standard     Home Equipment: None          Prior Functioning/Environment Level of Independence: Independent        Comments: Pt disabled due to COPD, and sleep apnea         OT Problem List: Cardiopulmonary status limiting activity;Obesity      OT Treatment/Interventions: Self-care/ADL training;Energy conservation;Therapeutic activities;Patient/family education;DME and/or AE instruction    OT Goals(Current goals can be found in the care plan section) Acute Rehab OT Goals Patient Stated Goal: to go home and sleep in own bed  OT Goal Formulation: With patient Time For Goal Achievement: 04/08/18 Potential to Achieve Goals: Good ADL Goals Pt Will Perform Grooming: with modified independence;standing Pt Will Perform Lower Body Bathing: with modified independence;sit to/from stand Pt Will Perform Lower Body Dressing: with modified independence;sit to/from stand Pt Will Transfer to Toilet: with modified independence;ambulating;regular height toilet;grab bars Additional ADL Goal #1: Pt will independently incorporate energy conservation techniques into ADLs  OT Frequency: Min 2X/week   Barriers to D/C:            Co-evaluation              AM-PAC OT "6 Clicks" Daily Activity     Outcome Measure Help from another person eating meals?: None Help from another person taking care of personal grooming?: None Help from another person toileting, which includes using toliet, bedpan, or urinal?: None Help from another person bathing (including washing, rinsing, drying)?: A Little Help from another person to put on and taking off regular upper body clothing?: None Help from another person to put on and taking off regular lower body clothing?: A Little 6 Click Score: 22   End  of Session Equipment Utilized During Treatment: Oxygen Nurse Communication: Mobility status  Activity Tolerance: Other (comment)(decreased 02) Patient left: in chair;with call bell/phone within reach;with chair alarm set;with family/visitor present  OT Visit Diagnosis: Unsteadiness on feet (R26.81)                Time: 2035-5974 OT Time Calculation (min): 48 min Charges:  OT General Charges $OT Visit: 1 Visit OT Evaluation $OT Eval Moderate Complexity: 1 Mod OT Treatments $Self Care/Home Management : 8-22 mins  Lucille Passy, OTR/L [place] Pager [phone] Office [phone]   Lucille Passy M 03/25/2018, 6:28 PM

## 2018-03-26 ENCOUNTER — Telehealth: Payer: Self-pay | Admitting: General Surgery

## 2018-03-26 LAB — COMPREHENSIVE METABOLIC PANEL
ALT: 66 U/L — ABNORMAL HIGH (ref 0–44)
AST: 48 U/L — ABNORMAL HIGH (ref 15–41)
Albumin: 3.4 g/dL — ABNORMAL LOW (ref 3.5–5.0)
Alkaline Phosphatase: 38 U/L (ref 38–126)
Anion gap: 12 (ref 5–15)
BUN: 20 mg/dL (ref 6–20)
CHLORIDE: 92 mmol/L — AB (ref 98–111)
CO2: 34 mmol/L — ABNORMAL HIGH (ref 22–32)
CREATININE: 0.85 mg/dL (ref 0.61–1.24)
Calcium: 8.9 mg/dL (ref 8.9–10.3)
GFR calc Af Amer: >60 mL/min (ref >60)
GFR calc non Af Amer: >60 mL/min (ref >60)
Glucose, Bld: 94 mg/dL (ref 70–99)
Potassium: 4.6 mmol/L (ref 3.5–5.1)
Sodium: 138 mmol/L (ref 135–145)
Total Bilirubin: 1.5 mg/dL — ABNORMAL HIGH (ref 0.3–1.2)
Total Protein: 6.1 g/dL — ABNORMAL LOW (ref 6.5–8.1)

## 2018-03-26 LAB — CBC
HEMATOCRIT: 63.2 % — AB (ref 39.0–52.0)
Hemoglobin: 19.9 g/dL — ABNORMAL HIGH (ref 13.0–17.0)
MCH: 35.2 pg — ABNORMAL HIGH (ref 26.0–34.0)
MCHC: 31.5 g/dL (ref 30.0–36.0)
MCV: 111.7 fL — ABNORMAL HIGH (ref 80.0–100.0)
PLATELETS: 84 10*3/uL — AB (ref 150–400)
RBC: 5.66 MIL/uL (ref 4.22–5.81)
RDW: 13.3 % (ref 11.5–15.5)
WBC: 5.7 10*3/uL (ref 4.0–10.5)
nRBC: 0 % (ref 0.0–0.2)

## 2018-03-26 LAB — GLUCOSE, CAPILLARY: GLUCOSE-CAPILLARY: 123 mg/dL — AB (ref 70–99)

## 2018-03-26 LAB — MAGNESIUM: Magnesium: 2 mg/dL (ref 1.7–2.4)

## 2018-03-26 MED ORDER — ASPIRIN 81 MG PO CHEW
162.0000 mg | CHEWABLE_TABLET | Freq: Every day | ORAL | Status: DC
  Administered 2018-03-27: 162 mg via ORAL
  Filled 2018-03-26: qty 2

## 2018-03-26 MED ORDER — ACETAMINOPHEN 500 MG PO TABS: 500.0000 mg | ORAL_TABLET | Freq: Four times a day (QID) | ORAL | Status: DC | PRN

## 2018-03-26 MED ORDER — FUROSEMIDE 10 MG/ML IJ SOLN
40.0000 mg | Freq: Once | INTRAMUSCULAR | Status: AC
  Administered 2018-03-26: 40 mg via INTRAVENOUS
  Filled 2018-03-26: qty 4

## 2018-03-26 MED ORDER — IBUPROFEN 600 MG PO TABS: 600.0000 mg | ORAL_TABLET | Freq: Three times a day (TID) | ORAL | Status: DC | PRN

## 2018-03-26 MED ORDER — BENZONATATE 100 MG PO CAPS
100.0000 mg | ORAL_CAPSULE | Freq: Three times a day (TID) | ORAL | Status: DC | PRN
  Administered 2018-03-26: 200 mg via ORAL
  Filled 2018-03-26: qty 2

## 2018-03-26 MED ORDER — ASPIRIN 81 MG PO CHEW: 81.0000 mg | CHEWABLE_TABLET | Freq: Every day | ORAL | Status: DC

## 2018-03-26 NOTE — Telephone Encounter (Signed)
-----   Message from Pulcifer, MD sent at 03/26/2018  5:14 PM EST ----- Regarding: Hospital follow-up Please schedule the above patient to be seen by Dr. Lamonte Sakai in 1-2 weeks for hospital follow-up

## 2018-03-26 NOTE — Progress Notes (Signed)
Patient improving. Moves well without lines and devices. Worked with PT walking and with stairs on 6L of O2. Only dropped to 87% after working on stairs. Patient states he is feeling better. Using Cpap from home.

## 2018-03-26 NOTE — Progress Notes (Signed)
[place] TEAM 1 - Stepdown/ICU TEAM  Billy [name]  RWE:[id number] DOB: 01-16-1971 DOA: 03/22/2018 PCP: Dorothyann Peng, NP    Brief Narrative:  48 year old current every day smoker with COPD, secondary polycythemia, and OSA (wears CPAP) who presented w/ severe SOB which rapidly worsened over a 2 day period.  Subjective: The patient is still requiring oxygen support higher than that consistently available at home.  He does feel that he is consistently improving.  He is becoming impatient wanting to go home as soon as possible.  He denies current chest pain nausea vomiting or abdominal pain.  He tells me he now understands he must never smoke again.  Assessment & Plan:  Acute on chronic hypoxic & hypercapnic respiratory failure - acute COPD exacerbation - Tobacco abuse  Care as directed by pulmonary -slowly improving -will be ready for discharge when able to be weaned to 6 L nasal cannula or less  OSA Continue nightly CPAP per home regimen  Acute on chronic diastolic CHF Normal EF on TTE this admit -modest volume overload on exam -adjust diuretic and follow -will likely benefit him to stay on the dry side  Filed Weights   03/24/18 0412 03/24/18 1829 03/26/18 0527  Weight: (!) 143 kg (!) 149.2 kg (!) 142.4 kg    Polycythemia Hx of phelebotomy - will need to be followed in outpt setting   ETOH abuse  Reportedly drinks 14 beers per day -has been counseled on the need to abstain from alcohol use  DVT prophylaxis: lovenox Code Status: FULL CODE Family Communication: Spoke with mother at bedside Disposition Plan: Stable for medical bed -titrate oxygen down as possible -potential discharge home next 24-48 hours  Consultants:  PCCM   Antimicrobials:  Azithromycin 1/26 > Rocephin 1/24   Objective: Blood pressure 139/85, pulse 93, temperature 98 F (36.7 C), temperature source Oral, resp. rate (!) 22, height 6\' 1"  (1.854 m), weight (!) 142.4 kg, SpO2 92  %.  Intake/Output Summary (Last 24 hours) at 03/26/2018 1800 Last data filed at 03/25/2018 2118 Gross per 24 hour  Intake -  Output 540 ml  Net -540 ml   Filed Weights   03/24/18 0412 03/24/18 1829 03/26/18 0527  Weight: (!) 143 kg (!) 149.2 kg (!) 142.4 kg    Examination: General: No acute respiratory distress at rest on high level O2 support  Lungs: Poor air movement in all fields with no focal crackles and no active wheezing Cardiovascular: Regular rate and rhythm without murmur gallop or rub normal S1 and S2 Abdomen: Nontender, nondistended, soft, bowel sounds positive, no rebound, no ascites, no appreciable mass Extremities: Trace bilateral lower extremity edema   CBC: Recent Labs  Lab 03/24/18 0658 03/25/18 0359 03/26/18 0315  WBC 10.6* 7.6 5.7  HGB 20.6* 20.1* 19.9*  HCT 64.7* 63.2* 63.2*  MCV 113.1* 113.1* 111.7*  PLT 110* 99* 84*   Basic Metabolic Panel: Recent Labs  Lab 03/23/18 0702 03/24/18 0658 03/25/18 0359 03/26/18 0315  NA 135 136 138 138  K 5.1 5.7* 4.4 4.6  CL 96* 90* 91* 92*  CO2 28 37* 38* 34*  GLUCOSE 214* 107* 95 94  BUN 26* 34* 30* 20  CREATININE 1.15 1.22 1.17 0.85  CALCIUM 9.0 9.7 9.0 8.9  MG 2.1  --   --  2.0  PHOS 5.1*  --   --   --    GFR: Estimated Creatinine Clearance: 157.7 mL/min (by C-G formula based on SCr of 0.85 mg/dL).  Liver Function Tests: Recent Labs  Lab 03/26/18 0315  AST 48*  ALT 66*  ALKPHOS 38  BILITOT 1.5*  PROT 6.1*  ALBUMIN 3.4*    Recent Labs  Lab 03/22/18 1834  AMMONIA 43*    HbA1C: Hgb A1c MFr Bld  Date/Time Value Ref Range Status  07/31/2017 01:40 PM 5.6 4.6 - 6.5 % Final    Comment:    Glycemic Control Guidelines for People with Diabetes:Non Diabetic:  <6%Goal of Therapy: <7%Additional Action Suggested:  >8%     CBG: Recent Labs  Lab 03/23/18 1150 03/23/18 1637 03/23/18 1928 03/24/18 0745 03/26/18 0810  GLUCAP 99 124* 132* 94 123*    Recent Results (from the past 240 hour(s))   MRSA PCR Screening     Status: None   Collection Time: 03/22/18  2:25 PM  Result Value Ref Range Status   MRSA by PCR NEGATIVE NEGATIVE Final    Comment:        The GeneXpert MRSA Assay (FDA approved for NASAL specimens only), is one component of a comprehensive MRSA colonization surveillance program. It is not intended to diagnose MRSA infection nor to guide or monitor treatment for MRSA infections. Performed at [facility], [place] [address], [place], [place] [address]      Scheduled Meds: . aspirin  162 mg Oral Daily  . azithromycin  250 mg Oral Daily  . Chlorhexidine Gluconate Cloth  6 each Topical Q0600  . enoxaparin (LOVENOX) injection  40 mg Subcutaneous Q24H  . folic acid  1 mg Oral Daily  . Influenza vac split quadrivalent PF  0.5 mL Intramuscular Tomorrow-1000  . ipratropium-albuterol  3 mL Nebulization Q6H  . multivitamin with minerals  1 tablet Oral Daily  . nicotine  21 mg Transdermal Daily  . pneumococcal 23 valent vaccine  0.5 mL Intramuscular Tomorrow-1000  . predniSONE  50 mg Oral Q breakfast  . thiamine  100 mg Oral Daily     LOS: 4 days   Cherene Altes, MD [facility] Office  [phone] Pager - Text Page per Amion  If 7PM-7AM, please contact night-coverage per Amion 03/26/2018, 6:00 PM

## 2018-03-26 NOTE — Progress Notes (Signed)
Physical Therapy Treatment Patient Details Name: Billy [name] MRN: [id number] DOB: [birth year] Today's Date: 03/26/2018    History of Present Illness This is a 48 y.o., male admitted on 03/22/2018 for acute hypoxemic hypercarbic respiratory failure.  Transferred to the intensive care unit after second evaluation on the floor due to minimal responsiveness from receiving Ativan for a alcohol withdrawal protocol and being on BiPAP.  CCM felt as if was necessary to transfer to the intensive care unit due to depressed GCS    PT Comments    Pt weaning down on 02 currently resting at 7L HFNC, decreased to 6L with activity and patient able to mobilize and maintain O2 sats 80% on the time.  One drop in 02 post stair negotiation to 87%, with standing rest break O2 sats improved.  Plan next session for continued cueing for pacing and energy conservation.      Follow Up Recommendations  Outpatient PT(cardiopulmonary PT.  )     Equipment Recommendations  None recommended by PT    Recommendations for Other Services       Precautions / Restrictions Restrictions Weight Bearing Restrictions: No    Mobility  Bed Mobility Overal bed mobility: Modified Independent                Transfers Overall transfer level: Needs assistance Equipment used: None Transfers: Sit to/from Stand Sit to Stand: Modified independent (Device/Increase time) Stand pivot transfers: Modified independent (Device/Increase time)       General transfer comment: no assistance needed and able to manage O2 tanks.    Ambulation/Gait Ambulation/Gait assistance: Supervision Gait Distance (Feet): 200 Feet Assistive device: None Gait Pattern/deviations: Step-through pattern;Trunk flexed     General Gait Details: Cues for pacing and pursed lip breathing, mild hypoxia with decrease in 02.  Pt on 6L [place] 87%-93%.     Stairs Stairs: Yes Stairs assistance: Supervision Stair Management: One rail Left Number of  Stairs: 4 General stair comments: Cues for sequencing and pacing.     Wheelchair Mobility    Modified Rankin (Stroke Patients Only)       Balance Overall balance assessment: Needs assistance Sitting-balance support: Feet supported Sitting balance-Leahy Scale: Good       Standing balance-Leahy Scale: Good                              Cognition Arousal/Alertness: Awake/alert Behavior During Therapy: WFL for tasks assessed/performed Overall Cognitive Status: Within Functional Limits for tasks assessed                                        Exercises      General Comments        Pertinent Vitals/Pain Pain Assessment: No/denies pain    Home Living                      Prior Function            PT Goals (current goals can now be found in the care plan section) Acute Rehab PT Goals Patient Stated Goal: to go home and sleep in own bed  Potential to Achieve Goals: Good Progress towards PT goals: Progressing toward goals    Frequency    Min 3X/week      PT Plan Current plan remains appropriate    Co-evaluation  AM-PAC PT "6 Clicks" Mobility   Outcome Measure  Help needed turning from your back to your side while in a flat bed without using bedrails?: None Help needed moving from lying on your back to sitting on the side of a flat bed without using bedrails?: None Help needed moving to and from a bed to a chair (including a wheelchair)?: None Help needed standing up from a chair using your arms (e.g., wheelchair or bedside chair)?: None Help needed to walk in hospital room?: None Help needed climbing 3-5 steps with a railing? : A Little 6 Click Score: 23    End of Session Equipment Utilized During Treatment: Gait belt Activity Tolerance: Patient tolerated treatment well Patient left: with call bell/phone within reach;in chair Nurse Communication: Mobility status PT Visit Diagnosis: Unsteadiness  on feet (R26.81)     Time: 1643-1700 PT Time Calculation (min) (ACUTE ONLY): 17 min  Charges:  $Gait Training: 8-22 mins                     Governor Rooks, PTA Acute Rehabilitation Services Pager [phone] Office [phone]     Oni Dietzman Billy [name] 03/26/2018, 5:02 PM

## 2018-03-26 NOTE — Telephone Encounter (Signed)
Spoke with patient Patient stated he was still in hospital as of call, thinks he will be discharged tomorrow and was going to call to set up an appointment. Appointment scheduled for 04/05/18 at 3:00. Patient aware of new office location. Nothing further needed at this time.

## 2018-03-26 NOTE — Progress Notes (Signed)
Rt note: patient on cpap at this time beginning of shift. Refused breathing treatment and insisted on being left alone. Will continue to monitor 10L bled oxygen with saturations of 93%

## 2018-03-26 NOTE — Progress Notes (Signed)
Rt note: patient is using his home cpap safety check done, patient refuses to use hospital dream station cpap patient having 8-10L oxygen bled for saturations of 90-92% patient resting comfortably will wean if able will continue to monitor

## 2018-03-26 NOTE — Care Management (Signed)
#    4.   S/W [facility] @ [employer] # [phone] OPT- 2  INHALERS :   1. SYMBICORT COVER- YES CO-PAY- $ 8.90 TIER- 3 DRUG PRIOR APPROVAL- NO  2. [place] $ 8.90 TIER- 3 DRUG PRIOR APPROVAL- NO  [PREFERRED PHARMACY - YES -  [facility]

## 2018-03-26 NOTE — Care Management Important Message (Signed)
Important Message  Patient Details  Name: Billy [name] MRN: [id number] Date of Birth: Feb 09, 1971   Medicare Important Message Given:  Yes    Rasmus Preusser Montine Circle 03/26/2018, 7:32 AM

## 2018-03-26 NOTE — Progress Notes (Signed)
NAME:  Billy [name], MRN:  [id number], DOB:  08-10-1970, LOS: 4 ADMISSION DATE:  03/22/2018, CONSULTATION DATE:  03/22/2018 REFERRING MD:  Valere Dross, CHIEF COMPLAINT:  Acute on chronic respiratory failure   Brief History   48 year old current every day smoker with COPD , polycythemia, and OSA  nocturnal hypoxemia ( wears CPAP)  with a history of bronchitis 2 weeks ago, with acute decompensation with wheezing and worsening dyspnea over the last 2 days to the point of needing oxygen 24/7 and being too weak to walk. His albuterol inhaler did not relieve his dyspnea. He had an OP appointment with Pulmonary ( Patient of Dr. Lamonte Sakai) day of admission, called the office asking to be seen sooner than noon, and was advised to call 911 and seek emergency care.  Past Medical History  COPD OSA - moderate, AHI 44.8 per hour, sig desaturation despite O2 Obesity  Polycythemia  Smoker   Significant Hospital Events   1/24  Admission  Consults:  PCCM  Procedures:    Significant Diagnostic Tests:  ECHO 1/24 >> poor images, LV normal size, moderate LVH, LVEF 60-65%  Micro Data:  Sputum 1/24 >>   Antimicrobials:     Interim history/subjective:  Pt upset that he had to be in the hospital.  States he is going to quit smoking.   Objective   Blood pressure (!) 127/55, pulse 88, temperature 97.7 F (36.5 C), temperature source Axillary, resp. rate (!) 22, height 6\' 1"  (1.854 m), weight (!) 142.4 kg, SpO2 (!) 89 %.        Intake/Output Summary (Last 24 hours) at 03/26/2018 0834 Last data filed at 03/25/2018 2118 Gross per 24 hour  Intake -  Output 1740 ml  Net -1740 ml   Filed Weights   03/24/18 0412 03/24/18 1829 03/26/18 0527  Weight: (!) 143 kg (!) 149.2 kg (!) 142.4 kg    Examination: General: adult male lying in bed in NAD  HEENT: MM pink/moist Neuro: AAOx4, speech clear, MAE CV: s1s2 rrr, no m/r/g PULM: even/non-labored, lungs bilaterally diminished  UX:NATF, non-tender, bsx4  active  Extremities: warm/dry, no edema, chronic LE venous changes Skin: no rashes or lesions, multiple tattoos   Resolved Hospital Problem list     Assessment & Plan:   Acute on chronic hypoxic, hypercapnic respiratory failure. Acute COPD Exacerbation. OSA. Tobacco, THC abuse. P: Azithro x 5 days total  Duoneb Q6 with PRN albuterol  Prednisone 50 mg QD x5 days QHS CPAP + PRN daytime sleep  Wean O2 for sats 88-95% > will need to be closer to 6L demand before discharge Smoking cessation counseling  Discharge med plan - complete abx/prednisone, symbicort + spiriva & PRN albuterol   Acute on chronic diastolic CHF -ECHO with suboptimal windows, LVEF normal  P: Consider lasix PRN  Polycythemia likely from hypoxia. P: Trend CBC   ETOH Drinks 14 beers per day P: No evidence of withdrawal   Best practice:  Diet: regular diet DVT prophylaxis: lovenox GI prophylaxis: not indicated Mobility: OOB Code Status: Full Family Communication: patient updated on plan of care 1/28  Labs   CBC: Recent Labs  Lab 03/22/18 0847 03/23/18 0702 03/24/18 0658 03/25/18 0359 03/26/18 0315  WBC 7.0 4.9 10.6* 7.6 5.7  HGB 21.4* 20.3* 20.6* 20.1* 19.9*  HCT 64.5* 63.9* 64.7* 63.2* 63.2*  MCV 109.7* 111.9* 113.1* 113.1* 111.7*  PLT 146* PLATELET CLUMPS NOTED ON SMEAR, COUNT APPEARS DECREASED 110* 99* 84*    Basic Metabolic Panel: Recent  Labs  Lab 03/22/18 1022 03/22/18 1108 03/23/18 0702 03/24/18 0658 03/25/18 0359 03/26/18 0315  NA 134*  --  135 136 138 138  K 4.7  --  5.1 5.7* 4.4 4.6  CL 94*  --  96* 90* 91* 92*  CO2 28  --  28 37* 38* 34*  GLUCOSE 126*  --  214* 107* 95 94  BUN 10  --  26* 34* 30* 20  CREATININE 1.02 1.00 1.15 1.22 1.17 0.85  CALCIUM 9.1  --  9.0 9.7 9.0 8.9  MG  --   --  2.1  --   --  2.0  PHOS  --   --  5.1*  --   --   --    GFR: Estimated Creatinine Clearance: 157.7 mL/min (by C-G formula based on SCr of 0.85 mg/dL). Recent Labs  Lab  03/23/18 0702 03/24/18 0658 03/25/18 0359 03/26/18 0315  WBC 4.9 10.6* 7.6 5.7    Liver Function Tests: Recent Labs  Lab 03/26/18 0315  AST 48*  ALT 66*  ALKPHOS 38  BILITOT 1.5*  PROT 6.1*  ALBUMIN 3.4*   No results for input(s): LIPASE, AMYLASE in the last 168 hours. Recent Labs  Lab 03/22/18 1834  AMMONIA 43*    ABG    Component Value Date/Time   PHART 7.304 (L) 03/23/2018 0330   PCO2ART 64.2 (H) 03/23/2018 0330   PO2ART 81.3 (L) 03/23/2018 0330   HCO3 31.0 (H) 03/23/2018 0330   TCO2 35.3 11/20/2008 1055   O2SAT 95.1 03/23/2018 0330     Coagulation Profile: No results for input(s): INR, PROTIME in the last 168 hours.  Cardiac Enzymes: No results for input(s): CKTOTAL, CKMB, CKMBINDEX, TROPONINI in the last 168 hours.  HbA1C: Hgb A1c MFr Bld  Date/Time Value Ref Range Status  07/31/2017 01:40 PM 5.6 4.6 - 6.5 % Final    Comment:    Glycemic Control Guidelines for People with Diabetes:Non Diabetic:  <6%Goal of Therapy: <7%Additional Action Suggested:  >8%     CBG: Recent Labs  Lab 03/23/18 1150 03/23/18 1637 03/23/18 1928 03/24/18 0745 03/26/18 0810  GLUCAP 99 124* 132* 94 123*    Billy Gens, NP-C [facility] Pgr: [phone] or if no answer [phone] 03/26/2018, 8:34 AM

## 2018-03-26 NOTE — Progress Notes (Signed)
NCM received consult: Pt reports his formulary changed > not sure what pulmonary meds are now covered. He was previously on symbicort + Spiriva. Can we check to see what is covered. Benefits check in process. Whitman Hero RN,BSN CM

## 2018-03-26 NOTE — Consult Note (Signed)
[facility] CM Primary Care Navigator  03/26/2018  Billy [name] 01-17-1971 [id number]   Met withpatient and mother Billy [name]) atthe bedside to identify possible discharge needs. Patient reports "I couldn't catch my breath" and albuterol inhaler did not relieve dyspnea, which resultedto this admission.(acute on chronic hypoxemic and hypercarbic respiratory failure, COPD exacerbation, tobacco and alcohol abuse, polycythemia)  Patientendorses Diplomatic Services operational officer with [employer] [facility] [employer] primary care provider.   [facility] so far.   Patienthas beenmanaginghis own medications at homestraight out of the containers.  Patient verbalized that he has been driving prior to admission and his mother will be able to provide transportation to his doctors' appointments if needed after discharge.  Patientreports that he lives with his mother who serves as his primary caregiver at home.  According to patient, anticipated dischargeplan is home with outpatient (cardiopulmonary) rehabilitation.  Patientvoiced understandingto callprimary care provider's office whenhereturnshome,for a post discharge follow-up visitwithin1- 2 weeksor sooner if needs arise.Patient letter (with PCP's contact number) was provided asareminder.  Explained topatientand mother regarding [facility] CM services available for health management and resources at home butpatient indicated not needing any services for now since he is still able to manage his health issues at home so far. Went over COPD action plan with patient and discussed smoking cessation, use of oxygen and CPAP therapy for sleep apnea.  Patient however, had verbally agreedand optedEMMI COPD calls to follow-up withhisrecovery at home.  Referral made for EMMI COPD calls after discharge.  Patientand mother  expressedunderstandingto seek referral from primary care provider to [facility] care management ifdeemed necessary and appropriatefor anyservicesin the nearfuture.   [facility] care management information was provided for future needs thatpatientmay have.  Primary care provider's office is listed as providing transition of care (TOC) follow-up.    For additional questions please contact:  Edwena Felty A. Adora Yeh, BSN, RN-BC [facility] PRIMARY CARE Navigator Cell: [phone]

## 2018-03-27 MED ORDER — BENZONATATE 100 MG PO CAPS: 100.0000 mg | ORAL_CAPSULE | Freq: Three times a day (TID) | ORAL | 0 refills | Status: DC | PRN

## 2018-03-27 MED ORDER — PREDNISONE 50 MG PO TABS: ORAL_TABLET | ORAL | 0 refills | Status: DC

## 2018-03-27 MED ORDER — THIAMINE HCL 100 MG PO TABS: 100.0000 mg | ORAL_TABLET | Freq: Every day | ORAL | 0 refills | Status: DC

## 2018-03-27 MED ORDER — AZITHROMYCIN 250 MG PO TABS: 250.0000 mg | ORAL_TABLET | Freq: Every day | ORAL | 0 refills | Status: DC

## 2018-03-27 MED ORDER — ASPIRIN 81 MG PO TABS: 161.0000 mg | ORAL_TABLET | Freq: Every day | ORAL | 0 refills | Status: AC

## 2018-03-27 MED ORDER — IPRATROPIUM-ALBUTEROL 0.5-2.5 (3) MG/3ML IN SOLN: 3.0000 mL | Freq: Four times a day (QID) | RESPIRATORY_TRACT | Status: DC | PRN

## 2018-03-27 MED ORDER — BUDESONIDE-FORMOTEROL FUMARATE 160-4.5 MCG/ACT IN AERO: 2.0000 | INHALATION_SPRAY | Freq: Two times a day (BID) | RESPIRATORY_TRACT | 0 refills | Status: DC

## 2018-03-27 MED ORDER — ALBUTEROL SULFATE HFA 108 (90 BASE) MCG/ACT IN AERS: 2.0000 | INHALATION_SPRAY | Freq: Four times a day (QID) | RESPIRATORY_TRACT | 0 refills | Status: DC | PRN

## 2018-03-27 MED ORDER — IPRATROPIUM-ALBUTEROL 0.5-2.5 (3) MG/3ML IN SOLN: 3.0000 mL | Freq: Four times a day (QID) | RESPIRATORY_TRACT | 0 refills | Status: DC | PRN

## 2018-03-27 MED ORDER — TIOTROPIUM BROMIDE MONOHYDRATE 18 MCG IN CAPS: 18.0000 ug | ORAL_CAPSULE | Freq: Every day | RESPIRATORY_TRACT | 0 refills | Status: DC

## 2018-03-27 MED FILL — predniSONE 50 MG TABS: 50 | 2 days supply | Qty: 2 | Fill #0

## 2018-03-27 MED FILL — BENZONATATE 100 MG CAP: 100 | 5 days supply | Qty: 20 | Fill #0

## 2018-03-27 MED FILL — IPRAT-ALBUT 0.5-3(2.5) MG/3: 0.5-2.5 (3) | 30 days supply | Qty: 360 | Fill #0

## 2018-03-27 MED FILL — ASPIRIN LOW DOSE 81 MG TBEC: 81 | 15 days supply | Qty: 30 | Fill #0

## 2018-03-27 MED FILL — THIAMINE HCL 100 MG TABS: 100 | 30 days supply | Qty: 30 | Fill #0

## 2018-03-27 MED FILL — AZITHROMYCIN 250 MG TABLET: 250 | 2 days supply | Qty: 2 | Fill #0

## 2018-03-27 NOTE — Discharge Summary (Signed)
PATIENT DETAILS Name: Billy [name] Age: 48 y.o. Sex: male Date of Birth: 1971-01-21 MRN: [id number]. Admitting Physician: Barb Merino, MD UTM:[id number], Tommi Rumps, NP  Admit Date: 03/22/2018 Discharge date: 03/27/2018  Recommendations for Outpatient Follow-up:  1. Follow up with PCP in 1-2 weeks 2. Please obtain BMP/CBC in one week 3. Ensure follow-up with pulmonology 4. Continue counseling regarding importance of tobacco cessation, compliance with oxygen and CPAP   Admitted From:  Home  Disposition: [place]: No  Equipment/Devices: Resume home O2.  Discharge Condition: Stable  CODE STATUS: FULL CODE  Diet recommendation:  Heart Healthy  Brief Summary: See H&P, Labs, Consult and Test reports for all details in brief, 48 year old male with history of longstanding tobacco use, COPD, chronic hypoxic respiratory failure on at least 4 L of oxygen at home, secondary polycythemia, OSA on CPAP-presenting with worsening shortness of breath-found to have acute on chronic hypoxic/hypercarbic respiratory failure secondary to COPD exacerbation.  See below for further details  Brief Hospital Course: Acute on chronic hypoxemic and hypercapnic respiratory failure secondary to COPD exacerbation: Improved-treated with steroids, bronchodilators.  He was placed back on CPAP nightly and PRN daytime sleep.  Oxygen requirement slowly came down-by day of discharge he was on 6 L of oxygen.  Patient felt much better and was insisting on going home today-pulmonology follow this patient closely throughout this hospital stay.  Recommendations are to continue with prednisone and antibiotics for a few more days-he will be resumed on his usual home inhaler regimen.  DME nebulizer machine and PRN nebulized bronchodilators have also been ordered.  He has been counseled extensively regarding importance of complete cessation of tobacco use.  Outpatient appointment with Dr. Lamonte Sakai has been  scheduled for next week.  Acute on chronic diastolic heart failure: Treated with Lasix-he was kept on the drier side of things-to help with respiration.  Euvolemic on exam.  Secondary polycythemia: Continue outpatient monitoring of CBC.  This is felt to be secondary to hypoxia.  EtOH use: No signs of withdrawal-counseled-continue thiamine on discharge.  Tobacco abuse: Counseled extensively  Procedures/Studies: None  Discharge Diagnoses:  Principal Problem:   Acute respiratory failure with hypoxia and hypercapnia (HCC) Active Problems:   Polycythemia, secondary   TOBACCO ABUSE   Obstructive sleep apnea   COPD with acute exacerbation (HCC)   Respiratory failure with hypoxia [facility])   Discharge Instructions:  Activity:  As tolerated  Discharge Instructions    Diet - low sodium heart healthy   Complete by:  As directed    Discharge instructions   Complete by:  As directed    Follow with Primary MD  Dorothyann Peng, NP in 1 week  Follow with pulmonology clinic as instructed  Stop smoking  Use oxygen 24/7  Use CPAP nightly  Please get a complete blood count and chemistry panel checked by your Primary MD at your next visit, and again as instructed by your Primary MD.  Get Medicines reviewed and adjusted: Please take all your medications with you for your next visit with your Primary MD  Laboratory/radiological data: Please request your Primary MD to go over all hospital tests and procedure/radiological results at the follow up, please ask your Primary MD to get all Hospital records sent to his/her office.  In some cases, they will be blood work, cultures and biopsy results pending at the time of your discharge. Please request that your primary care M.D. follows up on these results.  Also Note the following: If you  experience worsening of your admission symptoms, develop shortness of breath, life threatening emergency, suicidal or homicidal thoughts you must seek medical  attention immediately by calling 911 or calling your MD immediately  if symptoms less severe.  You must read complete instructions/literature along with all the possible adverse reactions/side effects for all the Medicines you take and that have been prescribed to you. Take any new Medicines after you have completely understood and accpet all the possible adverse reactions/side effects.   Do not drive when taking Pain medications or sleeping medications (Benzodaizepines)  Do not take more than prescribed Pain, Sleep and Anxiety Medications. It is not advisable to combine anxiety,sleep and pain medications without talking with your primary care practitioner  Special Instructions: If you have smoked or chewed Tobacco  in the last 2 yrs please stop smoking, stop any regular Alcohol  and or any Recreational drug use.  Wear Seat belts while driving.  Please note: You were cared for by a hospitalist during your hospital stay. Once you are discharged, your primary care physician will handle any further medical issues. Please note that NO REFILLS for any discharge medications will be authorized once you are discharged, as it is imperative that you return to your primary care physician (or establish a relationship with a primary care physician if you do not have one) for your post hospital discharge needs so that they can reassess your need for medications and monitor your lab values.   Increase activity slowly   Complete by:  As directed      Allergies as of 03/27/2018   No Known Allergies     Medication List    STOP taking these medications   ibuprofen 800 MG tablet Commonly known as:  ADVIL,MOTRIN     TAKE these medications   albuterol 108 (90 Base) MCG/ACT inhaler Commonly known as:  PROAIR HFA Inhale 2 puffs into the lungs every 6 (six) hours as needed for wheezing or shortness of breath. What changed:  See the new instructions.   aspirin 81 MG tablet Take 2 tablets (161 mg total) by  mouth daily.   azithromycin 250 MG tablet Commonly known as:  ZITHROMAX Take 1 tablet (250 mg total) by mouth daily. Start taking on:  March 28, 2018   benzonatate 100 MG capsule Commonly known as:  TESSALON Take 1-2 capsules (100-200 mg total) by mouth 3 (three) times daily as needed for cough.   budesonide-formoterol 160-4.5 MCG/ACT inhaler Commonly known as:  SYMBICORT Inhale 2 puffs into the lungs 2 (two) times daily.   ipratropium-albuterol 0.5-2.5 (3) MG/3ML Soln Commonly known as:  DUONEB Take 3 mLs by nebulization every 6 (six) hours as needed.   predniSONE 50 MG tablet Commonly known as:  DELTASONE Take 1 tablet daily for 2 more days and then stop Start taking on:  March 28, 2018   thiamine 100 MG tablet Take 1 tablet (100 mg total) by mouth daily. Start taking on:  March 28, 2018   tiotropium 18 MCG inhalation capsule Commonly known as:  SPIRIVA HANDIHALER Place 1 capsule (18 mcg total) into inhaler and inhale daily. Notes to patient:  03/28/18 8am            Durable Medical Equipment  (From admission, onward)         Start     Ordered   03/27/18 1126  For home use only DME oxygen  Once    Comments:  Needs oxymizer too  Question Answer Comment  Mode  or (Route) Nasal cannula   Liters per Minute 6   Frequency Continuous (stationary and portable oxygen unit needed)   Oxygen conserving device Yes   Oxygen delivery system Gas      03/27/18 1126   03/27/18 1016  For home use only DME Nebulizer machine  Once    Question:  Patient needs a nebulizer to treat with the following condition  Answer:  COPD (chronic obstructive pulmonary disease) ([place])   03/27/18 1015         Follow-up Information    Nafziger, Tommi Rumps, NP. Schedule an appointment as soon as possible for a visit in 1 week(s).   Specialty:  Family Medicine Contact information: [place] [address] [phone]        Collene Gobble, MD Follow up on 04/05/2018.     Specialty:  Pulmonary Disease Why:  appt at 3 pm Contact information: [address] [place] [address] [phone]          No Known Allergies  Consultations:   pulmonary/intensive care  Other Procedures/Studies: Dg Chest Port 1 View  Result Date: 03/23/2018 CLINICAL DATA:  Acute respiratory failure EXAM: PORTABLE CHEST 1 VIEW COMPARISON:  Chest x-rays dated 03/22/2018 and 06/29/2014. FINDINGS: Heart size is upper normal, stable. The bilateral interstitial prominence is stable compared to yesterday's exam. No new lung findings. No pleural effusion or pneumothorax seen. IMPRESSION: No change from yesterday's study. Bilateral interstitial prominence is stable compared to yesterday's exam but new compared to the older study of 06/29/2014, compatible with interstitial edema versus chronic interstitial fibrosis, favor some degree of acute interstitial edema. Electronically Signed   By: Franki Cabot M.D.   On: 03/23/2018 06:17   Dg Chest Port 1 View  Result Date: 03/22/2018 CLINICAL DATA:  Shortness of Breath EXAM: PORTABLE CHEST 1 VIEW COMPARISON:  Jun 29, 2014 FINDINGS: There is mild cardiomegaly with pulmonary venous hypertension. There is interstitial thickening, likely due to a degree of interstitial edema. No consolidation or pleural effusion evident. No adenopathy. No bone lesions. IMPRESSION: Pulmonary vascular congestion with apparent degree of interstitial edema. No frank consolidation. No adenopathy evident. Electronically Signed   By: Lowella Grip III M.D.   On: 03/22/2018 09:15      TODAY-DAY OF DISCHARGE:  Subjective:   [place] today has no headache,no chest abdominal pain,no new weakness tingling or numbness, feels much better wants to go home today.   Objective:   Blood pressure (!) 148/93, pulse 98, temperature 97.8 F (36.6 C), resp. rate (!) 25, height 6\' 1"  (1.854 m), weight (!) 142.4 kg, SpO2 93 %.  Intake/Output Summary (Last 24  hours) at 03/27/2018 1138 Last data filed at 03/27/2018 1000 Gross per 24 hour  Intake 610 ml  Output 450 ml  Net 160 ml   Filed Weights   03/24/18 0412 03/24/18 1829 03/26/18 0527  Weight: (!) 143 kg (!) 149.2 kg (!) 142.4 kg    Exam: Awake Alert, Oriented *3, No new F.N deficits, Normal affect [place].AT,PERRAL Supple Neck,No JVD, No cervical lymphadenopathy appriciated.  Symmetrical Chest wall movement, Good air movement bilaterally, CTAB RRR,No Gallops,Rubs or new Murmurs, No Parasternal Heave +ve B.Sounds, Abd Soft, Non tender, No organomegaly appriciated, No rebound -guarding or rigidity. No Cyanosis, Clubbing or edema, No new Rash or bruise   PERTINENT RADIOLOGIC STUDIES: Dg Chest Port 1 View  Result Date: 03/23/2018 CLINICAL DATA:  Acute respiratory failure EXAM: PORTABLE CHEST 1 VIEW COMPARISON:  Chest x-rays  dated 03/22/2018 and 06/29/2014. FINDINGS: Heart size is upper normal, stable. The bilateral interstitial prominence is stable compared to yesterday's exam. No new lung findings. No pleural effusion or pneumothorax seen. IMPRESSION: No change from yesterday's study. Bilateral interstitial prominence is stable compared to yesterday's exam but new compared to the older study of 06/29/2014, compatible with interstitial edema versus chronic interstitial fibrosis, favor some degree of acute interstitial edema. Electronically Signed   By: Franki Cabot M.D.   On: 03/23/2018 06:17   Dg Chest Port 1 View  Result Date: 03/22/2018 CLINICAL DATA:  Shortness of Breath EXAM: PORTABLE CHEST 1 VIEW COMPARISON:  Jun 29, 2014 FINDINGS: There is mild cardiomegaly with pulmonary venous hypertension. There is interstitial thickening, likely due to a degree of interstitial edema. No consolidation or pleural effusion evident. No adenopathy. No bone lesions. IMPRESSION: Pulmonary vascular congestion with apparent degree of interstitial edema. No frank consolidation. No adenopathy evident. Electronically  Signed   By: Lowella Grip III M.D.   On: 03/22/2018 09:15     PERTINENT LAB RESULTS: CBC: Recent Labs    03/25/18 0359 03/26/18 0315  WBC 7.6 5.7  HGB 20.1* 19.9*  HCT 63.2* 63.2*  PLT 99* 84*   CMET CMP     Component Value Date/Time   NA 138 03/26/2018 0315   NA 139 12/16/2014 1407   K 4.6 03/26/2018 0315   K 4.4 12/16/2014 1407   CL 92 (L) 03/26/2018 0315   CL 98 12/08/2013 0840   CO2 34 (H) 03/26/2018 0315   CO2 28 12/16/2014 1407   GLUCOSE 94 03/26/2018 0315   GLUCOSE 93 12/16/2014 1407   GLUCOSE 110 12/08/2013 0840   BUN 20 03/26/2018 0315   BUN 13.4 12/16/2014 1407   CREATININE 0.85 03/26/2018 0315   CREATININE 0.9 12/16/2014 1407   CALCIUM 8.9 03/26/2018 0315   CALCIUM 9.5 12/16/2014 1407   PROT 6.1 (L) 03/26/2018 0315   PROT 7.1 12/16/2014 1407   ALBUMIN 3.4 (L) 03/26/2018 0315   ALBUMIN 4.1 12/16/2014 1407   AST 48 (H) 03/26/2018 0315   AST 21 12/16/2014 1407   ALT 66 (H) 03/26/2018 0315   ALT 28 12/16/2014 1407   ALKPHOS 38 03/26/2018 0315   ALKPHOS 65 12/16/2014 1407   BILITOT 1.5 (H) 03/26/2018 0315   BILITOT 0.66 12/16/2014 1407   GFRNONAA >60 03/26/2018 0315   GFRAA >60 03/26/2018 0315    GFR Estimated Creatinine Clearance: 157.7 mL/min (by C-G formula based on SCr of 0.85 mg/dL). No results for input(s): LIPASE, AMYLASE in the last 72 hours. No results for input(s): CKTOTAL, CKMB, CKMBINDEX, TROPONINI in the last 72 hours. Invalid input(s): POCBNP No results for input(s): DDIMER in the last 72 hours. No results for input(s): HGBA1C in the last 72 hours. No results for input(s): CHOL, HDL, LDLCALC, TRIG, CHOLHDL, LDLDIRECT in the last 72 hours. No results for input(s): TSH, T4TOTAL, T3FREE, THYROIDAB in the last 72 hours.  Invalid input(s): FREET3 No results for input(s): VITAMINB12, FOLATE, FERRITIN, TIBC, IRON, RETICCTPCT in the last 72 hours. Coags: No results for input(s): INR in the last 72 hours.  Invalid input(s):  PT Microbiology: Recent Results (from the past 240 hour(s))  MRSA PCR Screening     Status: None   Collection Time: 03/22/18  2:25 PM  Result Value Ref Range Status   MRSA by PCR NEGATIVE NEGATIVE Final    Comment:        The GeneXpert MRSA Assay (FDA approved for NASAL specimens  only), is one component of a comprehensive MRSA colonization surveillance program. It is not intended to diagnose MRSA infection nor to guide or monitor treatment for MRSA infections. Performed at [facility], [place] [address], [place], [place] [address]     FURTHER DISCHARGE INSTRUCTIONS:  Get Medicines reviewed and adjusted: Please take all your medications with you for your next visit with your Primary MD  Laboratory/radiological data: Please request your Primary MD to go over all hospital tests and procedure/radiological results at the follow up, please ask your Primary MD to get all Hospital records sent to his/her office.  In some cases, they will be blood work, cultures and biopsy results pending at the time of your discharge. Please request that your primary care M.D. goes through all the records of your hospital data and follows up on these results.  Also Note the following: If you experience worsening of your admission symptoms, develop shortness of breath, life threatening emergency, suicidal or homicidal thoughts you must seek medical attention immediately by calling 911 or calling your MD immediately  if symptoms less severe.  You must read complete instructions/literature along with all the possible adverse reactions/side effects for all the Medicines you take and that have been prescribed to you. Take any new Medicines after you have completely understood and accpet all the possible adverse reactions/side effects.   Do not drive when taking Pain medications or sleeping medications (Benzodaizepines)  Do not take more than prescribed Pain, Sleep and Anxiety Medications. It is not  advisable to combine anxiety,sleep and pain medications without talking with your primary care practitioner  Special Instructions: If you have smoked or chewed Tobacco  in the last 2 yrs please stop smoking, stop any regular Alcohol  and or any Recreational drug use.  Wear Seat belts while driving.  Please note: You were cared for by a hospitalist during your hospital stay. Once you are discharged, your primary care physician will handle any further medical issues. Please note that NO REFILLS for any discharge medications will be authorized once you are discharged, as it is imperative that you return to your primary care physician (or establish a relationship with a primary care physician if you do not have one) for your post hospital discharge needs so that they can reassess your need for medications and monitor your lab values.  Total Time spent coordinating discharge including counseling, education and face to face time equals  45 minutes.  SignedOren Binet 03/27/2018 11:38 AM

## 2018-03-27 NOTE — Care Management Note (Signed)
Case Management Note  Patient Details  Name: Billy [name] MRN: [id number] Date of Birth: September 26, 1970  Subjective/Objective:   Acute respiratory failure with hypoxia and hypercapnia.                Hayven Fatima (Mother)     [phone]      PCP: Gates Rigg  Action/Plan: Transition to home with home health services ( RN) and oxygen. Home oxygen liter flow  increased this hospitalization, orders faxed to [facility]. [facility] to bring portable tank to bedside for transportation to home, and will meet pt @ home once d/c with oxygen concentrator.  Pt states already has nebulizer machine.  Pt states has transportation to home.  Expected Discharge Date:  03/27/18               Expected Discharge Plan:  [place]  In-House Referral:  NA  Discharge planning Services  CM Consult  Post Acute Care Choice:  Resumption of Svcs/PTA Provider([facility]/oxygen) Choice offered to:  Patient  DME Arranged:  Oxygen DME Agency:  [facility](active with [facility]/oxygen)  HH Arranged:  RN, Disease Management [place] Agency:  [place]  Status of Service:  Completed, signed off  If discussed at [place] of Stay Meetings, dates discussed:    Additional Comments:  Sharin Mons, RN 03/27/2018, 10:39 AM

## 2018-03-27 NOTE — Progress Notes (Signed)
Occupational Therapy Treatment Patient Details Name: Billy [name] MRN: [id number] DOB: January 04, 1971 Today's Date: 03/27/2018    History of present illness This is a 48 y.o., male admitted on 03/22/2018 for acute hypoxemic hypercarbic respiratory failure.  Transferred to the intensive care unit after second evaluation on the floor due to minimal responsiveness from receiving Ativan for a alcohol withdrawal protocol and being on BiPAP.  CCM felt as if was necessary to transfer to the intensive care unit due to depressed GCS   OT comments  Upon arrival pt sitting in recliner SpO2 92% 6lnc. Session focused on continued education on energy conservation strategies with provided handout. Pt maintained SpO2 >90 on 6lnc throughout session. Pt will benefit from continued education on energy conservation strategies during ADL to maximize independence and safety with ADL/IADL completion and functional mobility. Pt is excited to return home with continued follow-up care at d/c venue listed below. Will continue to follow acutely.    Follow Up Recommendations  Other (comment)(pulmonary rehab)    Equipment Recommendations       Recommendations for Other Services      Precautions / Restrictions Restrictions Weight Bearing Restrictions: No       Mobility Bed Mobility               General bed mobility comments: pt sitting in recliner upon arrival  Transfers Overall transfer level: Needs assistance Equipment used: None Transfers: Sit to/from Stand Sit to Stand: Modified independent (Device/Increase time)              Balance Overall balance assessment: Needs assistance Sitting-balance support: Feet supported Sitting balance-Leahy Scale: Good     Standing balance support: No upper extremity supported Standing balance-Leahy Scale: Good Standing balance comment: pt able to bend over to pick item off floor with S for safety                           ADL either  performed or assessed with clinical judgement   ADL Overall ADL's : Needs assistance/impaired                                     Functional mobility during ADLs: Supervision/safety General ADL Comments: provided handout and educated pt on energy conservation strategies to implement during ADL completion;pt would benefit from continued reinforcement on these strategies and pursed lip breathing     Vision       Perception     Praxis      Cognition Arousal/Alertness: Awake/alert Behavior During Therapy: WFL for tasks assessed/performed Overall Cognitive Status: Within Functional Limits for tasks assessed                                          Exercises     Shoulder Instructions       General Comments      Pertinent Vitals/ Pain       Pain Assessment: No/denies pain  Home Living                                          Prior Functioning/Environment  Frequency  Min 2X/week        Progress Toward Goals  OT Goals(current goals can now be found in the care plan section)  Progress towards OT goals: Progressing toward goals  Acute Rehab OT Goals Patient Stated Goal: to go home OT Goal Formulation: With patient Time For Goal Achievement: 04/08/18 Potential to Achieve Goals: Good ADL Goals Pt Will Perform Grooming: with modified independence;standing Pt Will Perform Lower Body Bathing: with modified independence;sit to/from stand Pt Will Perform Lower Body Dressing: with modified independence;sit to/from stand Pt Will Transfer to Toilet: with modified independence;ambulating;regular height toilet;grab bars Additional ADL Goal #1: Pt will independently incorporate energy conservation techniques into ADLs  Plan Discharge plan remains appropriate    Co-evaluation                 AM-PAC OT "6 Clicks" Daily Activity     Outcome Measure   Help from another person eating meals?: None Help  from another person taking care of personal grooming?: None Help from another person toileting, which includes using toliet, bedpan, or urinal?: None Help from another person bathing (including washing, rinsing, drying)?: A Little Help from another person to put on and taking off regular upper body clothing?: None Help from another person to put on and taking off regular lower body clothing?: A Little 6 Click Score: 22    End of Session Equipment Utilized During Treatment: Oxygen  OT Visit Diagnosis: Unsteadiness on feet (R26.81)   Activity Tolerance Patient tolerated treatment well   Patient Left in chair;with call bell/phone within reach;with family/visitor present   Nurse Communication Mobility status        Time: 1050-1059 OT Time Calculation (min): 9 min  Charges: OT General Charges $OT Visit: 1 Visit OT Treatments $Self Care/Home Management : 8-22 mins  Dorinda Hill OTR/L Acute Rehabilitation Services Office: [place] 03/27/2018, 11:07 AM

## 2018-03-27 NOTE — Care Management (Signed)
Faxed O2 order with oxymizer to [facility] at [phone]

## 2018-03-27 NOTE — Progress Notes (Signed)
SATURATION QUALIFICATIONS: (This note is used to comply with regulatory documentation for home oxygen)  Patient Saturations on Room Air at Rest = 89%  Patient Saturations on Room Air while Ambulating = 87%  Patient Saturations on 6 Liters of oxygen while Ambulating = 93%  Please briefly explain why patient needs home oxygen: Patient O2 sats drop without oxygen.

## 2018-03-27 NOTE — Progress Notes (Signed)
Insisting on leaving today-claims that he feels much better-Per nursing staff he is down to around 6 L of oxygen.  His lung sounds are clear.  Claims he has oxygen and CPAP at home-and is going home today.  Will discharge patient home at his own request-follow-up with pulmonary.  See discharge summary for further details

## 2018-03-27 NOTE — Progress Notes (Signed)
Discharge teaching complete. Meds, diet, activity, follow up appointments, smoking cessation reviewed and al questions answered. Copy of instructions given to patient and transitional care pharmacy to bring meds to patient. Mother at bedside. Awaiting oxygen to be delivered.

## 2018-03-28 ENCOUNTER — Telehealth: Payer: Self-pay | Admitting: *Deleted

## 2018-03-28 DIAGNOSIS — I517 Cardiomegaly: Secondary | ICD-10-CM | POA: Diagnosis not present

## 2018-03-28 DIAGNOSIS — G4733 Obstructive sleep apnea (adult) (pediatric): Secondary | ICD-10-CM | POA: Diagnosis not present

## 2018-03-28 DIAGNOSIS — J9622 Acute and chronic respiratory failure with hypercapnia: Secondary | ICD-10-CM | POA: Diagnosis not present

## 2018-03-28 DIAGNOSIS — J441 Chronic obstructive pulmonary disease with (acute) exacerbation: Secondary | ICD-10-CM | POA: Diagnosis not present

## 2018-03-28 DIAGNOSIS — J9621 Acute and chronic respiratory failure with hypoxia: Secondary | ICD-10-CM | POA: Diagnosis not present

## 2018-03-28 DIAGNOSIS — Z9989 Dependence on other enabling machines and devices: Secondary | ICD-10-CM | POA: Diagnosis not present

## 2018-03-28 DIAGNOSIS — Z7952 Long term (current) use of systemic steroids: Secondary | ICD-10-CM | POA: Diagnosis not present

## 2018-03-28 DIAGNOSIS — Z6841 Body Mass Index (BMI) 40.0 and over, adult: Secondary | ICD-10-CM | POA: Diagnosis not present

## 2018-03-28 DIAGNOSIS — F1010 Alcohol abuse, uncomplicated: Secondary | ICD-10-CM | POA: Diagnosis not present

## 2018-03-28 DIAGNOSIS — Z7951 Long term (current) use of inhaled steroids: Secondary | ICD-10-CM | POA: Diagnosis not present

## 2018-03-28 DIAGNOSIS — Z7982 Long term (current) use of aspirin: Secondary | ICD-10-CM | POA: Diagnosis not present

## 2018-03-28 DIAGNOSIS — D751 Secondary polycythemia: Secondary | ICD-10-CM | POA: Diagnosis not present

## 2018-03-28 DIAGNOSIS — I5033 Acute on chronic diastolic (congestive) heart failure: Secondary | ICD-10-CM | POA: Diagnosis not present

## 2018-03-28 DIAGNOSIS — F17210 Nicotine dependence, cigarettes, uncomplicated: Secondary | ICD-10-CM | POA: Diagnosis not present

## 2018-03-28 DIAGNOSIS — I739 Peripheral vascular disease, unspecified: Secondary | ICD-10-CM | POA: Diagnosis not present

## 2018-03-28 DIAGNOSIS — Z792 Long term (current) use of antibiotics: Secondary | ICD-10-CM | POA: Diagnosis not present

## 2018-03-28 NOTE — Telephone Encounter (Signed)
Transition Care Management Follow-up Telephone Call  Recommendations for Outpatient Follow-up:  1. Follow up with PCP in 1-2 weeks 2. Please obtain BMP/CBC in one week 3. Ensure follow-up with pulmonology 4. Continue counseling regarding importance of tobacco cessation, compliance with oxygen and CPAP   Admitted From:  Home Disposition: [place]: No Equipment/Devices: Resume home O2. Discharge Condition: Stable CODE STATUS: FULL CODE Diet recommendation:  Heart Healthy  Date discharged?  Admit Date: 03/22/2018  Discharge date: 03/27/2018   How have you been since you were released from the hospital? "I finally slept last night"    Do you understand why you were in the hospital? Yes, "I was having trouble breathing and catching my breath. My mom had to call EMS" " I have stopped smoking! I am using the Nicotine patches and have not smoked in 7 days and do not plan on ever starting back!"  Do you understand the discharge instructions? yes,   Ensure follow-up with pulmonology  Continue counseling regarding importance of tobacco cessation, compliance with oxygen and CPAP   Equipment/Devices:  Resume home O2 -- 6 liters O2 24/7   Where were you discharged to? Home with Mother - I moved in months back to help my Mother    Items Reviewed:  Medications reviewed: yes  Allergies reviewed: yes  Dietary changes reviewed: yes  Referrals reviewed: yes -- Appt with Dr. Baltazar Apo at Pulmonary 04/05/2018 -- need to get set up with Pulm Rehab   Functional Questionnaire:  Activities of Daily Living (ADLs):   He states they are independent in the following: ambulation, bathing and hygiene, feeding, continence, grooming, toileting and dressing States they require assistance with the following: n/a    Any transportation issues/concerns?: no -- just walking long distances. I advised that when he is discussing with Tommi Rumps or Dr Lamonte Sakai about Pulm Rehab that he needs to  specify his concerns with walking long distances. Pt aware there is valet at the Hospital.    Any patient concerns? yes, He is concerned with the high liter flow of O2 he has been placed on. States that it was not really discussed how long he would be on this high of liter flow. Pt d/c'd on 6 liters continuous O2 via [place]. Pt states prior to admission he was using 2 liters PRN and with CPAP at night. Pt states that by the time he made it to the hospital he was starting to build up CO2 and was placed on the increased O2. Pt is having frequent nose bleeds - pt asked if safe to remove Sells from nose when resting to let his nose have a break - does not have a pulse oximeter to check his O2 levels at home - I advised that since he does not have a pulse Ox to check his levels that taking the O2 off for prolonged periods of time could be dangerous. I advised that if sitting and resting, not exerting himself, he will be okay to remove O2 for a "FEW MINUTES" to let his nasal passages rest - patient made aware of importance to wear O2 as instructed until adivsed otherwise by either Effie Berkshire, NP or Dr Lamonte Sakai. Pt expressed understanding.    Confirmed importance and date/time of follow-up visits scheduled yes   Discussed with Tommi Rumps - up to patient if he feels like he wants an appt with PCP or just wants to see Pulmonary and go from there as they are the ones that will address his  O2 an Rehab needs. Pt agreed that he wants to see Dr Lamonte Sakai first and will contact our office if HFU is needed. Per Tommi Rumps, use Saline nasal sprays for nose bleeds - use PRN.   Confirmed with patient if condition begins to worsen call PCP or go to the ER.  Patient was given the office number and encouraged to call back with question or concerns.  : yes

## 2018-03-29 ENCOUNTER — Other Ambulatory Visit: Payer: Self-pay

## 2018-03-29 NOTE — Patient Outreach (Signed)
[facility]) Care Management  03/29/2018  Billy [name] 1970-09-05 [id number]  EMMI: COPD red alert Referral date: 03/29/18 Referral reason: # of times rescue inhaler used in past 24 hours: 9 Insurance:  Health team advantage Day # 1  Telephone call to patient regarding EMMI COPD red alert.HIPAA verified with patient. Explained reason for call. Patient states he has not used his inhaler since discharge from the hospital because he has used his nebulizer at least 3 times per day.  Patient states her started his symbicort on yesterday.  Patient states his instructions at discharge were to use his home oxygen and 6 liters around the clock. Patient states he felt this was too much.  Patient states he spoke with the nurse a this primary MD office and was advised to get a pulse oximetry to monitor his oxygen levels on and off his oxygen then use oxygen during the day as needed. Patient states his oxygen levels have been running between 88 and 90.  He states he has not been exerting himself since being home from the hospital.  Patient states he is now using his oxygen as needed during the day. Patient states he has COPD and sleep apnea.  He states he uses his CPAP machine at night with oxygen.  Patient confirms he has a follow up appointment with his pulmonologist on 04/05/18.  Patient states he will be starting pulmonary rehab soon.  Patient states he is taking the all of his medications as prescribed. Patient reports he has transportation to his appointment. Patient states he has not smoked a cigarette since 04/21/18. RNCM offered to provide patient education information on smoking cessation. Patient declined. Patient states he is currently on a patch and wants to self manage his smoking cessation. RNCM discussed and offered ongoing follow up education with  [facility] care manager  For COPD. Patient declined.  RNCM offered to provide patient 24 hour nurse advise line. Patient declined, Stating  he has all the information and phone number he needs.  RNCM advised patient to notify MD of any changes in condition prior to scheduled appointment. RNCM verified patient aware of 911 services for urgent/ emergent needs.  PLAN:  RNCM will close patient due to patient being assessed and having no further needs.   [name] RN,BSN,CCM [facility] Telephonic  [phone]     PLAN:

## 2018-04-01 DIAGNOSIS — G4733 Obstructive sleep apnea (adult) (pediatric): Secondary | ICD-10-CM | POA: Diagnosis not present

## 2018-04-01 DIAGNOSIS — J449 Chronic obstructive pulmonary disease, unspecified: Secondary | ICD-10-CM | POA: Diagnosis not present

## 2018-04-03 ENCOUNTER — Telehealth: Payer: Self-pay | Admitting: Adult Health

## 2018-04-03 NOTE — Telephone Encounter (Signed)
pCopied from [place] [id number]. Topic: Quick Communication - Home Health Verbal Orders >> Apr 03, 2018 11:23 AM Judyann Munson wrote: Caller/Agency: [facility]  Callback Number: [phone] Requesting OT/PT/Skilled Nursing/Social Work:   Heart care Management Frequency:1x1, 2x1, 1x2    Mickel Baas stated she has faxed over anohter order on 04-03-2018

## 2018-04-03 NOTE — Telephone Encounter (Signed)
Ok for verbal orders ?

## 2018-04-04 NOTE — Telephone Encounter (Signed)
Laura notified to proceed with orders.  Nothing further needed. 

## 2018-04-05 ENCOUNTER — Ambulatory Visit: Payer: PPO | Admitting: Emergency Medicine

## 2018-04-05 ENCOUNTER — Encounter: Payer: Self-pay | Admitting: Emergency Medicine

## 2018-04-05 VITALS — BP 132/84 | HR 92 | Ht 73.0 in | Wt 315.0 lb

## 2018-04-05 DIAGNOSIS — J9691 Respiratory failure, unspecified with hypoxia: Secondary | ICD-10-CM

## 2018-04-05 DIAGNOSIS — F17200 Nicotine dependence, unspecified, uncomplicated: Secondary | ICD-10-CM

## 2018-04-05 DIAGNOSIS — J449 Chronic obstructive pulmonary disease, unspecified: Secondary | ICD-10-CM

## 2018-04-05 DIAGNOSIS — G4733 Obstructive sleep apnea (adult) (pediatric): Secondary | ICD-10-CM | POA: Diagnosis not present

## 2018-04-05 DIAGNOSIS — J438 Other emphysema: Secondary | ICD-10-CM

## 2018-04-05 DIAGNOSIS — E662 Morbid (severe) obesity with alveolar hypoventilation: Secondary | ICD-10-CM

## 2018-04-05 NOTE — Patient Instructions (Signed)
Please continue Symbicort 2 puffs twice a day.  Rinse and gargle after you use this medication. Go ahead and restart your Spiriva once daily. You can continue to use your DuoNeb or your albuterol inhaler up to every 4 hours if you need it for shortness of breath, chest tightness, wheezing. We will refer you to pulmonary rehab You need to wear your supplemental oxygen with exertion with a goal of keeping your saturations greater than 88%. Continue your CPAP with oxygen every night as you have been using it. Congratulations on stopping smoking.  Continue to use your nicotine patches.  This is the most important thing that you have been able to do for your health.  Do not restart. Follow with Dr. Lamonte Sakai in 2 months or sooner if you have any problems.

## 2018-04-05 NOTE — Progress Notes (Signed)
Subjective:    Patient ID: Billy [name], male    DOB: 1970/09/22, 48y.o.   MRN: [id number] HPI 48yo male with Severe COPD, severe OSA, pulmonary HTN, hypoxemia   ROV 04/12/16 -- This follow-up visit for patient with a history of obstructive sleep apnea, severe COPD. He has a history of associated polycythemia. His bronchodilators are Spiriva and Symbicort.  A compliance download is available for his CPAP for the last 3 months. This shows great compliance with greater than 4 hours of use on 91% of the nights. He is on an AutoSet from 5-20 cm water his pressure seems to be optimal between 12-15.  He now has a new concentrator at home, working well. He is cutting down on both EtOH and tobacco, knows that he needs to do so.             ROV 10/24/16 -- Patient has history of obesity, severe COPD, OSA/OHS with hypoxemic respiratory failure and associated polycythemia. He continues to smoke. He turned in his oxygen in May 2018 due to cost - all seemed to stem from requirement for ONO on CPAP and then changing his portable concentrator to a large oxygen concentrator.  Apparently also does advanced Homecare money .  Taking cefalexin for R LE cellulitis.  He did not tolerate wellbutrin.  Continues to use spiriva, symbicort.   Hosp F/U visit 04/05/18 >> 48 year old obese gentleman with history of tobacco, severe COPD, chronic hypoxemia and obesity hypoventilation syndrome that has resulted and polycythemia.  Also with hypertension and diastolic CHF.  He is on CPAP nightly.  He was admitted to the hospital with acute on chronic respiratory failure from 1/24-1/29, treated for an acute exacerbation of his COPD, diuresed. Course c/b resp suppression when he received some ativan.  He now returns for hospital follow-up. He is feeling better, breathing better, has been able to walk to trash cans (an improvement).  He stopped smoking - has not smoked in 15 days. He has been using DuoNeb TID. Has not been on the Symbicort or  Spiriva. He is using O2 rarely (was told to use 5-6L/min). His SpO2 has been ~88-90%. He is using his CPAP + 5L/min reliably.                                                                                                 Objective:   Physical Exam  Vitals:   04/05/18 1448  BP: 132/84  Pulse: 92  SpO2: 90%  Weight: (!) 315 lb (142.9 kg)  Height: 6\' 1"  (1.854 m)   Gen: Pleasant, obese, in no distress,  normal affect  ENT: No lesions,  mouth clear,  oropharynx clear, no postnasal drip  Neck: No JVD, no stridor  Lungs: clear B, no wheezes  Cardiovascular: RRR, heart sounds normal, no murmur or gallops, trace LE edema  Musculoskeletal: No deformities, no cyanosis or clubbing  Neuro: alert, non focal  Skin: some chronic venous stasis changes B LE, healing R LE abrasion.     Assessment & Plan:  COPD (chronic obstructive pulmonary disease) ([place]) With recent severe exacerbation and acute  on chronic respiratory failure.  He has been using principally DuoNeb since he got home from the hospital.  I am going to put him back on his Symbicort and Spiriva, continue DuoNeb and or albuterol as needed.  Respiratory failure with hypoxia (HCC) Continue exertional oxygen with a goal of SPO2 greater than 88%.  Obstructive sleep apnea Continue his current CPAP +5 L/min he has good compliance.  Good clinical benefit.  TOBACCO ABUSE He stopped 15 days ago.  I congratulated him.  We will continue his nicotine patches  Baltazar Apo, MD, PhD 04/05/2018, 3:36 PM [facility] [phone] or if no answer [phone]

## 2018-04-05 NOTE — Assessment & Plan Note (Signed)
He stopped 15 days ago.  I congratulated him.  We will continue his nicotine patches

## 2018-04-05 NOTE — Assessment & Plan Note (Addendum)
Continue his current CPAP +5 L/min he has good compliance.  Good clinical benefit.

## 2018-04-05 NOTE — Assessment & Plan Note (Signed)
With recent severe exacerbation and acute on chronic respiratory failure.  He has been using principally DuoNeb since he got home from the hospital.  I am going to put him back on his Symbicort and Spiriva, continue DuoNeb and or albuterol as needed.

## 2018-04-05 NOTE — Assessment & Plan Note (Signed)
Continue exertional oxygen with a goal of SPO2 greater than 88%.

## 2018-04-10 ENCOUNTER — Other Ambulatory Visit: Payer: Self-pay | Admitting: Emergency Medicine

## 2018-04-15 ENCOUNTER — Telehealth: Payer: Self-pay | Admitting: Emergency Medicine

## 2018-04-15 ENCOUNTER — Telehealth (HOSPITAL_COMMUNITY): Payer: Self-pay

## 2018-04-15 ENCOUNTER — Other Ambulatory Visit: Payer: Self-pay | Admitting: Emergency Medicine

## 2018-04-15 NOTE — Telephone Encounter (Signed)
Referral received from [facility] for Pulmonary Rehab with diagnosis of other emphysema and obesity hypoventilation syndrome. Clinical review of pt follow up appt on 04/05/18 Pulmonary office note. Pt appropriate for scheduling for Pulmonary rehab. Will forward to support staff for scheduling and verification of insurance eligibility/benefits with pt consent.   Joycelyn Man, RN, BSN Cardiac and Pulmonary Rehab Nurse

## 2018-04-15 NOTE — Telephone Encounter (Signed)
LMTCB with [facility].   LMTCB with patient. Will let patient know we have left a message with Pulm Rehab and will get back with him once we hear a update.

## 2018-04-16 ENCOUNTER — Telehealth (HOSPITAL_COMMUNITY): Payer: Self-pay

## 2018-04-16 NOTE — Telephone Encounter (Signed)
Called patient to see if he was interested in participating in the Pulmonary Rehab Program. Patient stated yes. Patient will come in for orientation on 04/29/2018 @ 1:30pm and will attend the 1:30pm exercise class.  Mailed homework package. Tedra Sen[place]. Support Rep II

## 2018-04-16 NOTE — Telephone Encounter (Signed)
I have called pulm rehab back and left another message to call back. [phone].

## 2018-04-17 NOTE — Telephone Encounter (Signed)
Looks like he is already scheduled with pulm rehab  Spoke with the pt and he states he was able to speak with them and nothing further needed

## 2018-04-27 DIAGNOSIS — G4733 Obstructive sleep apnea (adult) (pediatric): Secondary | ICD-10-CM | POA: Diagnosis not present

## 2018-04-27 DIAGNOSIS — J449 Chronic obstructive pulmonary disease, unspecified: Secondary | ICD-10-CM | POA: Diagnosis not present

## 2018-04-29 ENCOUNTER — Encounter (HOSPITAL_COMMUNITY)
Admission: RE | Admit: 2018-04-29 | Discharge: 2018-04-29 | Disposition: A | Discharge status: Home / Self Care | Payer: PPO | Source: Ambulatory Visit | Attending: Emergency Medicine | Admitting: Emergency Medicine

## 2018-04-29 ENCOUNTER — Encounter (HOSPITAL_COMMUNITY): Payer: Self-pay

## 2018-04-29 VITALS — BP 128/70 | HR 74 | Temp 97.8°F | Resp 18 | Ht 72.5 in | Wt 321.7 lb

## 2018-04-29 DIAGNOSIS — J438 Other emphysema: Secondary | ICD-10-CM | POA: Insufficient documentation

## 2018-04-29 NOTE — Progress Notes (Signed)
Billy [name] 48 y.o. male Pulmonary Rehab Orientation Note Billy [name] who is referred to pulmonary rehab by Dr. Lamonte Sakai for emphysema.  Pt arrived today in Cardiac and Pulmonary Rehab for orientation to Pulmonary Rehab. He ambulated from the main entrance of the hospital.  Pt tolerated fair with some complaints of shortness of breath. He does not carry portable oxygen. Per pt, he uses oxygen at night when going to sleep. Pt also has CPAP machine. Color good, skin warm and dry. Patient is oriented to time and place. Patient's medical history, psychosocial health, and medications reviewed. Psychosocial assessment reveals no complaints of stress, anxiety or depression. Pt is currently unemployed, disabled. Pt hobbies include hunting. Pt reports his stress level is none. Although he lives at home with his mom because of her need to have someone there after her lung surgery.  Pt has twin sons one who lives with his girlfriend and her family.  The other son is up [place] working. Pt does not endorse any depression however he does report issues with sleeping.  At times he can't turn his brain off. PHQ2/9 score 0/3. Pt shows good  coping skills but admits to drinking heavily and calls himself a functional alcoholic.  Pt reports at time he drinks very heavy.  Pt with positive outlook Will continue to monitor and evaluate progress toward psychosocial goal(s) of continued well being mentally and improve physical activity. Physical assessment reveals heart rate is normal, breath sounds diminished. Grip strength equal, strong. Distal pulses palpable.  Pt wears compression socks. Patient reports he does not take medications as prescribed. Patient states he follows a Regular diet.  The patient reports no specific efforts to gain or lose weight.Pt would like to lose weight with ultimate goal of 250 lbs.  Pt became inactive when he was approved for disability.  Pt use to work Architect. Patient's weight will be monitored  closely. Demonstration and practice of PLB using pulse oximeter. Patient able to return demonstration satisfactorily. Safety and hand hygiene in the exercise area reviewed with patient. Patient voices understanding of the information reviewed. Department expectations discussed with patient and achievable goals were set. The patient shows enthusiasm about attending the program and we look forward to working with this nice gentleman. The patient is scheduled for a 6 min walk test on 04/30/2018 and to begin exercise on 05/07/2018 at 10:30. 45 minutes was spent on a variety of activities such as assessment of the patient, obtaining baseline data including height, weight, BMI, and grip strength, verifying medical history, allergies, and current medications, and teaching patient strategies for performing tasks with less respiratory effort with emphasis on pursed lip breathing.[id number] Billy [name], BSN Cardiac and Training and development officer

## 2018-04-30 ENCOUNTER — Encounter (HOSPITAL_COMMUNITY)
Admission: RE | Admit: 2018-04-30 | Discharge: 2018-04-30 | Disposition: A | Discharge status: Home / Self Care | Payer: PPO | Source: Ambulatory Visit | Attending: Emergency Medicine | Admitting: Emergency Medicine

## 2018-04-30 DIAGNOSIS — J438 Other emphysema: Secondary | ICD-10-CM

## 2018-04-30 NOTE — Progress Notes (Signed)
Pulmonary Individual Treatment Plan  Patient Details  Name: Billy [name] MRN: [id number] Date of Birth: 1970/03/20 Referring Provider:     Pulmonary Rehab Walk Test from 04/30/2018 in [place]  Referring Provider  Dr. Lamonte Sakai      Initial Encounter Date:    Pulmonary Rehab Walk Test from 04/30/2018 in [place]  Date  04/30/18      Visit Diagnosis: Other emphysema ([place])  Patient's Home Medications on Admission:   Current Outpatient Medications:  .  albuterol (PROAIR HFA) 108 (90 Base) MCG/ACT inhaler, Inhale 2 puffs into the lungs every 6 (six) hours as needed for wheezing or shortness of breath., Disp: 1 Inhaler, Rfl: 0 .  aspirin 81 MG tablet, Take 2 tablets (161 mg total) by mouth daily., Disp: 30 tablet, Rfl: 0 .  ipratropium-albuterol (DUONEB) 0.5-2.5 (3) MG/3ML SOLN, Take 3 mLs by nebulization every 6 (six) hours as needed., Disp: 360 mL, Rfl: 0 .  SYMBICORT 160-4.5 MCG/ACT inhaler, TAKE 2 PUFFS BY MOUTH TWICE A DAY, Disp: 10.2 Inhaler, Rfl: 5 .  tiotropium (SPIRIVA) 18 MCG inhalation capsule, PLACE 1 CAPSULE INTO INHALER AND INHALE DAILY, Disp: 30 capsule, Rfl: 5  Past Medical History: Past Medical History:  Diagnosis Date  . COPD (chronic obstructive pulmonary disease) ([place])   . History of bronchitis   . Obesity   . Polycythemia    a. Dr. Marin Olp.Marland KitchenMarland KitchenJAK-2 analysis is negative.  b. likely physiologic secondary to chronic hypoxia (? OSA + COPD + obesity).  c. s/p plebotomy x1  . PVD (peripheral vascular disease) ([place])   . Sleep apnea    a. mod severe by sleep study 9.30.11: AHI 44.8 per hour; oxygen desat to a nadir of 65%; unsuccessful CPAP titration; significant hypoxemia even with supp o2 at 3LPM (dest to 70-80%); needs eval for cardiopulmonary disease and upper airway obstruction  . Tobacco abuse     Tobacco Use: Social History   Tobacco Use  Smoking Status Former Smoker  . Packs/day: 0.50  .  Years: 25.00  . Pack years: 12.50  . Types: Cigarettes  . Last attempt to quit: 03/21/2018  . Years since quitting: 0.1  Smokeless Tobacco Never Used    Labs: Recent Chemical engineer    Labs for ITP Cardiac and Pulmonary Rehab Latest Ref Rng & Units 11/20/2008 07/31/2017 03/23/2018   Cholestrol 0 - 200 mg/dL - 164 -   LDLCALC 0 - 99 mg/dL - 103(H) -   HDL >39.00 mg/dL - 40.60 -   Trlycerides 0.0 - 149.0 mg/dL - 106.0 -   Hemoglobin A1c 4.6 - 6.5 % - 5.6 -   PHART 7.350 - 7.450 7.338(L) - 7.304(L)   PCO2ART 32.0 - 48.0 mmHg 63.7 CRITICAL RESULT CALLED TO, READ BACK BY AND VERIFIED WITH: AMANDARASH,RN AT 1114,BY TAMMIEREADLINGRRT,RCPON9/24/10(HH) - 64.2(H)   HCO3 20.0 - 28.0 mmol/L 33.3(H) - 31.0(H)   TCO2 0 - 100 mmol/L 35.3 - -   O2SAT % 84.9 - 95.1      Capillary Blood Glucose: Lab Results  Component Value Date   GLUCAP 123 (H) 03/26/2018   GLUCAP 94 03/24/2018   GLUCAP 132 (H) 03/23/2018   GLUCAP 124 (H) 03/23/2018   GLUCAP 99 03/23/2018     Pulmonary Assessment Scores: Pulmonary Assessment Scores    Row Name 04/29/18 1631         ADL UCSD   ADL Phase  Entry     SOB Score total  33       CAT Score   CAT Score  22        Pulmonary Function Assessment:   Exercise Target Goals: Exercise Program Goal: Individual exercise prescription set using results from initial 6 min walk test and THRR while considering  patient's activity barriers and safety.   Exercise Prescription Goal: Initial exercise prescription builds to 30-45 minutes a day of aerobic activity, 2-3 days per week.  Home exercise guidelines will be given to patient during program as part of exercise prescription that the participant will acknowledge.  Activity Barriers & Risk Stratification: Activity Barriers & Cardiac Risk Stratification - 04/29/18 1412      Activity Barriers & Cardiac Risk Stratification   Activity Barriers  Shortness of Breath    Comments  some weight, wrist sore    Cardiac  Risk Stratification  Low       6 Minute Walk: 6 Minute Walk    Row Name 04/30/18 1605         6 Minute Walk   Phase  Initial     Distance  1450 feet     Walk Time  6 minutes     # of Rest Breaks  0     MPH  2.74     METS  3.07     RPE  10     Perceived Dyspnea   2     Symptoms  Yes (comment)     Comments  leg fatigue     Resting HR  95 bpm     Resting BP  120/80     Resting Oxygen Saturation   92 %     Exercise Oxygen Saturation  during 6 min walk  88 %     Max Ex. HR  105 bpm     Max Ex. BP  158/70       Interval HR   1 Minute HR  104     2 Minute HR  105     3 Minute HR  103     4 Minute HR  103     5 Minute HR  102     6 Minute HR  102     2 Minute Post HR  101     Interval Heart Rate?  Yes       Interval Oxygen   Interval Oxygen?  Yes     Baseline Oxygen Saturation %  92 %     1 Minute Oxygen Saturation %  91 %     1 Minute Liters of Oxygen  0 L     2 Minute Oxygen Saturation %  90 %     2 Minute Liters of Oxygen  0 L     3 Minute Oxygen Saturation %  89 %     3 Minute Liters of Oxygen  0 L     4 Minute Oxygen Saturation %  89 %     4 Minute Liters of Oxygen  0 L     5 Minute Oxygen Saturation %  88 %     5 Minute Liters of Oxygen  0 L     6 Minute Oxygen Saturation %  88 %     6 Minute Liters of Oxygen  0 L     2 Minute Post Oxygen Saturation %  92 %     2 Minute Post Liters of Oxygen  0 L  Oxygen Initial Assessment: Oxygen Initial Assessment - 04/30/18 1609      Initial 6 min Walk   Oxygen Used  None      Program Oxygen Prescription   Program Oxygen Prescription  None       Oxygen Re-Evaluation:   Oxygen Discharge (Final Oxygen Re-Evaluation):   Initial Exercise Prescription: Initial Exercise Prescription - 04/30/18 1600      Date of Initial Exercise RX and Referring Provider   Date  04/30/18    Referring Provider  Dr. Lamonte Sakai      Bike   Level  0.4    Minutes  17      NuStep   Level  3    SPM  80    Minutes  17     METs  1.5      Track   Laps  15    Minutes  17      Prescription Details   Frequency (times per week)  2    Duration  Progress to 45 minutes of aerobic exercise without signs/symptoms of physical distress      Intensity   THRR 40-80% of Max Heartrate  69-138    Ratings of Perceived Exertion  11-13    Perceived Dyspnea  0-4      Progression   Progression  Continue to progress workloads to maintain intensity without signs/symptoms of physical distress.      Resistance Training   Training Prescription  Yes    Weight  blue bands    Reps  10-15       Perform Capillary Blood Glucose checks as needed.  Exercise Prescription Changes:   Exercise Comments:   Exercise Goals and Review: Exercise Goals    Row Name 04/29/18 1413             Exercise Goals   Increase Physical Activity  Yes       Intervention  Provide advice, education, support and counseling about physical activity/exercise needs.;Develop an individualized exercise prescription for aerobic and resistive training based on initial evaluation findings, risk stratification, comorbidities and participant's personal goals.       Expected Outcomes  Short Term: Attend rehab on a regular basis to increase amount of physical activity.;Long Term: Add in home exercise to make exercise part of routine and to increase amount of physical activity.;Long Term: Exercising regularly at least 3-5 days a week.       Increase Strength and Stamina  Yes       Intervention  Provide advice, education, support and counseling about physical activity/exercise needs.;Develop an individualized exercise prescription for aerobic and resistive training based on initial evaluation findings, risk stratification, comorbidities and participant's personal goals.       Expected Outcomes  Short Term: Increase workloads from initial exercise prescription for resistance, speed, and METs.;Short Term: Perform resistance training exercises routinely during rehab  and add in resistance training at home;Long Term: Improve cardiorespiratory fitness, muscular endurance and strength as measured by increased METs and functional capacity (6MWT)       Able to understand and use rate of perceived exertion (RPE) scale  Yes       Intervention  Provide education and explanation on how to use RPE scale       Expected Outcomes  Short Term: Able to use RPE daily in rehab to express subjective intensity level;Long Term:  Able to use RPE to guide intensity level when exercising independently       Able to understand  and use Dyspnea scale  Yes       Intervention  Provide education and explanation on how to use Dyspnea scale       Expected Outcomes  Short Term: Able to use Dyspnea scale daily in rehab to express subjective sense of shortness of breath during exertion;Long Term: Able to use Dyspnea scale to guide intensity level when exercising independently       Knowledge and understanding of Target Heart Rate Range (THRR)  Yes       Intervention  Provide education and explanation of THRR including how the numbers were predicted and where they are located for reference       Expected Outcomes  Short Term: Able to state/look up THRR;Short Term: Able to use daily as guideline for intensity in rehab;Long Term: Able to use THRR to govern intensity when exercising independently       Understanding of Exercise Prescription  Yes       Intervention  Provide education, explanation, and written materials on patient's individual exercise prescription       Expected Outcomes  Short Term: Able to explain program exercise prescription;Long Term: Able to explain home exercise prescription to exercise independently       Improve claudication pain toleration; Improve walking ability  Yes       Intervention  Participate in PAD/SET Rehab 2-3 days a week, walking at home as part of exercise prescription;Attend education sessions to aid in risk factor modification and understanding of disease  process wears compression socks       Expected Outcomes  Short Term: Improve walking distance/time to onset of claudication pain;Long Term: Improve walking ability and toleration to claudication;Long Term: Improve score of PAD questionnaires          Exercise Goals Re-Evaluation :   Discharge Exercise Prescription (Final Exercise Prescription Changes):   Nutrition:  Target Goals: Understanding of nutrition guidelines, daily intake of sodium 1500mg , cholesterol 200mg , calories 30% from fat and 7% or less from saturated fats, daily to have 5 or more servings of fruits and vegetables.  Biometrics:    Nutrition Therapy Plan and Nutrition Goals:   Nutrition Assessments:   Nutrition Goals Re-Evaluation:   Nutrition Goals Discharge (Final Nutrition Goals Re-Evaluation):   Psychosocial: Target Goals: Acknowledge presence or absence of significant depression and/or stress, maximize coping skills, provide positive support system. Participant is able to verbalize types and ability to use techniques and skills needed for reducing stress and depression.  Initial Review & Psychosocial Screening: Initial Psych Review & Screening - 04/29/18 1421      Initial Review   Current issues with  Current Sleep Concerns   unable to stop thinking about things when going asleep     [place]?  Yes    Comments  lives with mom, good group of freinds      Barriers   Psychosocial barriers to participate in program  The patient should benefit from training in stress management and relaxation.      Screening Interventions   Interventions  Encouraged to exercise    Expected Outcomes  Short Term goal: Identification and review with participant of any Quality of Life or Depression concerns found by scoring the questionnaire.;Long Term goal: The participant improves quality of Life and PHQ9 Scores as seen by post scores and/or verbalization of changes       Quality of  Life Scores:  Scores of 19 and below usually indicate a poorer quality  of life in these areas.  A difference of  2-3 points is a clinically meaningful difference.  A difference of 2-3 points in the total score of the Quality of Life Index has been associated with significant improvement in overall quality of life, self-image, physical symptoms, and general health in studies assessing change in quality of life.  PHQ-9: Recent Review Flowsheet Data    Depression screen [facility] 2/9 04/29/2018 06/09/2013 03/10/2013   Decreased Interest 0 0 0   Down, Depressed, Hopeless 0 0 0   PHQ - 2 Score 0 0 0   Altered sleeping 1 - -   Tired, decreased energy 1 - -   Change in appetite 1 - -   Trouble concentrating 0 - -   Moving slowly or fidgety/restless 0 - -   Suicidal thoughts 0 - -   PHQ-9 Score 3 - -   Difficult doing work/chores Not difficult at all - -     Interpretation of Total Score  Total Score Depression Severity:  1-4 = Minimal depression, 5-9 = Mild depression, 10-14 = Moderate depression, 15-19 = Moderately severe depression, 20-27 = Severe depression   Psychosocial Evaluation and Intervention:   Psychosocial Re-Evaluation:   Psychosocial Discharge (Final Psychosocial Re-Evaluation):   Education: Education Goals: Education classes will be provided on a weekly basis, covering required topics. Participant will state understanding/return demonstration of topics presented.  Learning Barriers/Preferences: Learning Barriers/Preferences - 04/29/18 1423      Learning Barriers/Preferences   Learning Barriers  Sight   reading glasses   Learning Preferences  Computer/Internet;Group Instruction;Individual Instruction;Skilled Demonstration;Verbal Instruction;Written Material;Video       Education Topics: Risk Factor Reduction:  -Group instruction that is supported by a PowerPoint presentation. Instructor discusses the definition of a risk factor, different risk factors for pulmonary  disease, and how the heart and lungs work together.     Nutrition for Pulmonary Patient:  -Group instruction provided by PowerPoint slides, verbal discussion, and written materials to support subject matter. The instructor gives an explanation and review of healthy diet recommendations, which includes a discussion on weight management, recommendations for fruit and vegetable consumption, as well as protein, fluid, caffeine, fiber, sodium, sugar, and alcohol. Tips for eating when patients are short of breath are discussed.   Pursed Lip Breathing:  -Group instruction that is supported by demonstration and informational handouts. Instructor discusses the benefits of pursed lip and diaphragmatic breathing and detailed demonstration on how to preform both.     Oxygen Safety:  -Group instruction provided by PowerPoint, verbal discussion, and written material to support subject matter. There is an overview of "What is Oxygen" and "Why do we need it".  Instructor also reviews how to create a safe environment for oxygen use, the importance of using oxygen as prescribed, and the risks of noncompliance. There is a brief discussion on traveling with oxygen and resources the patient may utilize.   Oxygen Equipment:  -Group instruction provided by [facility] Staff utilizing handouts, written materials, and equipment demonstrations.   Signs and Symptoms:  -Group instruction provided by written material and verbal discussion to support subject matter. Warning signs and symptoms of infection, stroke, and heart attack are reviewed and when to call the physician/911 reinforced. Tips for preventing the spread of infection discussed.   Advanced Directives:  -Group instruction provided by verbal instruction and written material to support subject matter. Instructor reviews Advanced Directive laws and proper instruction for filling out document.   Pulmonary Video:  -Group video  education that reviews the  importance of medication and oxygen compliance, exercise, good nutrition, pulmonary hygiene, and pursed lip and diaphragmatic breathing for the pulmonary patient.   Exercise for the Pulmonary Patient:  -Group instruction that is supported by a PowerPoint presentation. Instructor discusses benefits of exercise, core components of exercise, frequency, duration, and intensity of an exercise routine, importance of utilizing pulse oximetry during exercise, safety while exercising, and options of places to exercise outside of rehab.     Pulmonary Medications:  -Verbally interactive group education provided by instructor with focus on inhaled medications and proper administration.   Anatomy and Physiology of the Respiratory System and Intimacy:  -Group instruction provided by PowerPoint, verbal discussion, and written material to support subject matter. Instructor reviews respiratory cycle and anatomical components of the respiratory system and their functions. Instructor also reviews differences in obstructive and restrictive respiratory diseases with examples of each. Intimacy, Sex, and Sexuality differences are reviewed with a discussion on how relationships can change when diagnosed with pulmonary disease. Common sexual concerns are reviewed.   MD DAY -A group question and answer session with a medical doctor that allows participants to ask questions that relate to their pulmonary disease state.   OTHER EDUCATION -Group or individual verbal, written, or video instructions that support the educational goals of the pulmonary rehab program.   Holiday Eating Survival Tips:  -Group instruction provided by PowerPoint slides, verbal discussion, and written materials to support subject matter. The instructor gives patients tips, tricks, and techniques to help them not only survive but enjoy the holidays despite the onslaught of food that accompanies the holidays.   Knowledge Questionnaire  Score: Knowledge Questionnaire Score - 04/29/18 1632      Knowledge Questionnaire Score   Pre Score  16/18       Core Components/Risk Factors/Patient Goals at Admission: Personal Goals and Risk Factors at Admission - 04/29/18 1424      Core Components/Risk Factors/Patient Goals on Admission    Weight Management  Yes;Obesity;Weight Loss    Intervention  Obesity: Provide education and appropriate resources to help participant work on and attain dietary goals.;Weight Management/Obesity: Establish reasonable short term and long term weight goals.;Weight Management: Develop a combined nutrition and exercise program designed to reach desired caloric intake, while maintaining appropriate intake of nutrient and fiber, sodium and fats, and appropriate energy expenditure required for the weight goal.;Weight Management: Provide education and appropriate resources to help participant work on and attain dietary goals.    Admit Weight  321 lb 10.4 oz (145.9 kg)    Goal Weight: Short Term  310 lb (140.6 kg)    Goal Weight: Long Term  290 lb (131.5 kg)    Expected Outcomes  Understanding of distribution of calorie intake throughout the day with the consumption of 4-5 meals/snacks;Understanding recommendations for meals to include 15-35% energy as protein, 25-35% energy from fat, 35-60% energy from carbohydrates, less than 200mg  of dietary cholesterol, 20-35 gm of total fiber daily;Weight Loss: Understanding of general recommendations for a balanced deficit meal plan, which promotes 1-2 lb weight loss per week and includes a negative energy balance of [phone] kcal/d;Long Term: Adherence to nutrition and physical activity/exercise program aimed toward attainment of established weight goal;Short Term: Continue to assess and modify interventions until short term weight is achieved    Improve shortness of breath with ADL's  Yes    Intervention  Provide education, individualized exercise plan and daily activity  instruction to help decrease symptoms of SOB with activities  of daily living.    Expected Outcomes  Short Term: Improve cardiorespiratory fitness to achieve a reduction of symptoms when performing ADLs;Long Term: Be able to perform more ADLs without symptoms or delay the onset of symptoms       Core Components/Risk Factors/Patient Goals Review:    Core Components/Risk Factors/Patient Goals at Discharge (Final Review):    ITP Comments: ITP Comments    Row Name 04/29/18 1405           ITP Comments  Dr. Geanie Logan Medical Director Pulmonary Rehab          Comments:

## 2018-04-30 NOTE — Progress Notes (Signed)
Subjective:   Billy [name] is a 48 y.o. male who presents for an Initial Medicare Annual Wellness Visit.  Review of Systems  No ROS.  Medicare Wellness Visit. Additional risk factors are reflected in the social history.  Cardiac Risk Factors include: male gender;smoking/ tobacco exposure;sedentary lifestyle;obesity (BMI >30kg/m2)(mother still smokes, but is smoking outside since pt. has quit smoking) Sleep patterns: has interrupted sleep, gets up 1 times nightly to void and sleeps 7-8 hours nightly.  Uses CPAP with 5L O2 which has been approved by pulmonology per pt. Pt. States he has occasionally woken up in the middle of the night and is unable to go back to sleep, lots of racing thoughts. Pt. Stated he uses albuterol inhaler every night, and author cautioned him to only use as rescue treatment, and not before bed unless he absolutely needs. Pt. monitors his O2 levels at home, and knows that readings of 88%-92% is normal for his condition. Importance of not hyperoxygenating discussed as he has COPD. O2 sat 91% today. Breathing exercises to help pt. Relax at night provided.   Home Safety/Smoke Alarms: Feels safe in home. Smoke alarms in place.  Living environment; residence and Firearm Safety: 1-story house/ trailer, no firearms, firearms stored safely. No use or need for DME at this time.  Seat Belt Safety/Bike Helmet: Wears seat belt.   Male:   CCS- none on record.  No need at this time d/t age      PSA-  Lab Results  Component Value Date   PSA 0.66 07/31/2017      Objective:    Today's Vitals   05/01/18 1341  BP: 130/80  Pulse: 80  Resp: 16  Temp: 98 F (36.7 C)  SpO2: 91%  Weight: (!) 321 lb (145.6 kg)  Height: 6\' 1"  (1.854 m)   Body mass index is 42.35 kg/m.  Advanced Directives 05/01/2018 04/29/2018 03/22/2018 03/22/2018 12/16/2014 06/08/2014  Does Patient Have a Medical Advance Directive? No No No No No No  Would patient like information on creating a medical  advance directive? No - Patient declined No - Patient declined No - Patient declined - No - patient declined information No - patient declined information    Current Medications (verified) Outpatient Encounter Medications as of 05/01/2018  Medication Sig  . albuterol (PROAIR HFA) 108 (90 Base) MCG/ACT inhaler Inhale 2 puffs into the lungs every 6 (six) hours as needed for wheezing or shortness of breath.  Marland Kitchen aspirin 81 MG tablet Take 2 tablets (161 mg total) by mouth daily.  Marland Kitchen ipratropium-albuterol (DUONEB) 0.5-2.5 (3) MG/3ML SOLN Take 3 mLs by nebulization every 6 (six) hours as needed.  . SYMBICORT 160-4.5 MCG/ACT inhaler TAKE 2 PUFFS BY MOUTH TWICE A DAY  . tiotropium (SPIRIVA) 18 MCG inhalation capsule PLACE 1 CAPSULE INTO INHALER AND INHALE DAILY  . [DISCONTINUED] sildenafil (VIAGRA) 50 MG tablet Take 1 tablet (50 mg total) by mouth daily as needed for erectile dysfunction.   No facility-administered encounter medications on file as of 05/01/2018.     Allergies (verified) Ativan [lorazepam]   History: Past Medical History:  Diagnosis Date  . COPD (chronic obstructive pulmonary disease) ([place])   . History of bronchitis   . Obesity   . Polycythemia    a. Dr. Marin Olp.Marland KitchenMarland KitchenJAK-2 analysis is negative.  b. likely physiologic secondary to chronic hypoxia (? OSA + COPD + obesity).  c. s/p plebotomy x1  . PVD (peripheral vascular disease) ([place])   . Sleep apnea  a. mod severe by sleep study 9.30.11: AHI 44.8 per hour; oxygen desat to a nadir of 65%; unsuccessful CPAP titration; significant hypoxemia even with supp o2 at 3LPM (dest to 70-80%); needs eval for cardiopulmonary disease and upper airway obstruction  . Tobacco abuse    Past Surgical History:  Procedure Laterality Date  . knee injury  1982  . TONSILLECTOMY AND ADENOIDECTOMY  1972   Family History  Problem Relation Age of Onset  . Other Mother        Wegener's Disease  . Lung cancer Paternal Grandmother   . Drug abuse Father     . Alcohol abuse Father   . Asthma Father    Social History   Socioeconomic History  . Marital status: Divorced    Spouse name: Not on file  . Number of children: 2  . Years of education: Not on file  . Highest education level: Not on file  Occupational History  . Occupation: unemployed  Social Needs  . Financial resource strain: Not hard at all  . Food insecurity:    Worry: Never true    Inability: Never true  . Transportation needs:    Medical: No    Non-medical: No  Tobacco Use  . Smoking status: Former Smoker    Packs/day: 0.50    Years: 25.00    Pack years: 12.50    Types: Cigarettes    Last attempt to quit: 03/21/2018    Years since quitting: 0.1  . Smokeless tobacco: Never Used  Substance and Sexual Activity  . Alcohol use: Yes    Alcohol/week: 76.0 standard drinks    Types: 36 Cans of beer, 40 Shots of liquor per week  . Drug use: Yes    Frequency: 1.0 times per week    Types: Marijuana    Comment: marajuana...also valium not prescribed  . Sexual activity: Not Currently  Lifestyle  . Physical activity:    Days per week: 0 days    Minutes per session: 0 min  . Stress: To some extent  Relationships  . Social connections:    Talks on phone: Once a week    Gets together: Once a week    Attends religious service: Not on file    Active member of club or organization: No    Attends meetings of clubs or organizations: Never    Relationship status: Divorced  Other Topics Concern  . Not on file  Social History Narrative   05/01/2018:   Lives with mother on one level   Has two 42yo sons who live nearby   Enjoys hunting/fishing   Since being hospitalized for COPD exacerbation, has quit smoking, and is motivated to do aerobic exercise   Heavy alcohol consumer, does not see it as a problem, no interest in reducing alcohol intake.   Tobacco Counseling Counseling given: Not Answered  Activities of Daily Living In your present state of health, do you have any  difficulty performing the following activities: 05/01/2018 03/22/2018  Hearing? N N  Vision? N N  Difficulty concentrating or making decisions? N N  Walking or climbing stairs? N N  Dressing or bathing? N N  Doing errands, shopping? N N  Preparing Food and eating ? N -  Using the Toilet? N -  In the past six months, have you accidently leaked urine? N -  Do you have problems with loss of bowel control? N -  Managing your Medications? N -  Managing your Finances? N -  Housekeeping or managing your Housekeeping? N -  Some recent data might be hidden     Immunizations and Health Maintenance There is no immunization history for the selected administration types on file for this patient. There are no preventive care reminders to display for this patient.  Patient Care Team: Dorothyann Peng, NP as PCP - General (Family Medicine) Collene Gobble, MD as Consulting Physician (Pulmonary Disease)  Indicate any recent Medical Services you may have received from other than [facility] providers in the past year (date may be approximate).    Assessment:   This is a routine wellness examination for [employer]. Physical assessment deferred to PCP.   Hearing/Vision screen  Visual Acuity Screening   Right eye Left eye Both eyes  Without correction: 20/20 20/20 20/20   With correction:     Comments: Does not see an eye Dr, but no vision concerns. Uses reader glasses for fine print close up.  Hearing Screening Comments: Able to hear conversational tones w/o difficulty. No issues reported.   Declined hearing exam.   Dietary issues and exercise activities discussed: Current Exercise Habits: The patient does not participate in regular exercise at present(is going to start pulmonary rehab soon), Exercise limited by: cardiac condition(s);respiratory conditions(s) Diet (meal preparation, eat out, water intake, caffeinated beverages, dairy products, fruits and vegetables): in general, an "unhealthy" diet. BMI around  42. Portion control and not eating late at night discussed. "food for thought" resource provided. Pt. stated once pulmonary rehab is done, he is going to start swimming in his pool more and/or sign up for [employer] to help reduce weight. Pt. Is heavy alcohol drinker, and knows it contributes to weight gain but pt. states "I could take it or leave it. But if I do drink it's a lot. I also do marijuana, I'm not going to lie". Pt. Is aware of health risks associated with heavy alcohol consumption. Pt. has done "AA" before, and said that "it was a waste of time". Pt. not interested in getting support on reducing alcohol intake. Pt. has recently quit smoking though, using nicotine patches at this time.      Goals    . Patient Stated     Improve breathing, do pulmonary rehab and start swimming/going to [employer]      Depression Screen PHQ 2/9 Scores 05/01/2018 04/29/2018 06/09/2013 03/10/2013  PHQ - 2 Score 0 0 0 0  PHQ- 9 Score 3 3 - -    Fall Risk Fall Risk  05/01/2018 04/29/2018 12/16/2014 06/08/2014 12/08/2013  Falls in the past year? 0 0 No No No     Cognitive Function:       Ad8 score reviewed for issues:  Issues making decisions: no  Less interest in hobbies / activities: no  Repeats questions, stories (family complaining): no  Trouble using ordinary gadgets (microwave, computer, phone):no  Forgets the month or year: no  Mismanaging finances: no  Remembering appts: no  Daily problems with thinking and/or memory: no Ad8 score is= 0   Screening Tests Health Maintenance  Topic Date Due  . TETANUS/TDAP  08/01/2018 (Originally 08/16/1988)  . HIV Screening  Completed    Qualifies for Shingles Vaccine? No.      Plan:    GREAT job quitting smoking. Let us know if we can help you to prevent relapse at all.  Good luck with pulmonary rehab. Ask if they have incentive spirometers or flutter valves for you to use at home while resting.  Continue being mindful  of diet  (not eating late at night like you do now, increasing heart-healthy foods into diet), and staying hydrated with water.  Refer to breathing exercises provided that may help when you have interrupted sleep. Only using albuterol as 'rescue' treatment will probably help in reducing feelings of anxiety as well.  Let us know if we can help you with anything else! I have personally reviewed and noted the following in the patient's chart:   . Medical and social history . Use of alcohol, tobacco or illicit drugs  . Current medications and supplements . Functional ability and status . Nutritional status . Physical activity . Advanced directives . List of other physicians . Hospitalizations, surgeries, and ER visits in previous 12 months . Vitals . Screenings to include cognitive, depression, and falls . Referrals and appointments  In addition, I have reviewed and discussed with patient certain preventive protocols, quality metrics, and best practice recommendations. A written personalized care plan for preventive services as well as general preventive health recommendations were provided to patient.     Alphia Moh, RN   05/01/2018

## 2018-05-01 ENCOUNTER — Ambulatory Visit (INDEPENDENT_AMBULATORY_CARE_PROVIDER_SITE_OTHER): Payer: PPO

## 2018-05-01 VITALS — BP 130/80 | HR 80 | Temp 98.0°F | Resp 16 | Ht 73.0 in | Wt 321.0 lb

## 2018-05-01 DIAGNOSIS — Z0000 Encounter for general adult medical examination without abnormal findings: Secondary | ICD-10-CM

## 2018-05-01 NOTE — Patient Instructions (Addendum)
GREAT job quitting smoking. Let us know if we can help you to prevent relapse at all.  Good luck with pulmonary rehab. Ask if they have incentive spirometers or flutter valves for you to use at home while resting.  Continue being mindful of diet (not eating late at night like you do now, increasing heart-healthy foods into diet), and staying hydrated with water.  Refer to breathing exercises provided that may help when you have interrupted sleep. Only using albuterol as 'rescue' treatment will probably help in reducing feelings of anxiety as well.  Let us know if we can help you with anything else!   Billy [name] , Thank you for taking time to come for your Medicare Wellness Visit. I appreciate your ongoing commitment to your health goals. Please review the following plan we discussed and let me know if I can assist you in the future.   These are the goals we discussed: Goals    . Patient Stated     Improve breathing, do pulmonary rehab and start swimming/going to [employer]       This is a list of the screening recommended for you and due dates:  Health Maintenance  Topic Date Due  . Tetanus Vaccine  08/01/2018*  . HIV Screening  Completed  *Topic was postponed. The date shown is not the original due date.     Health Maintenance, Male A healthy lifestyle and preventive care is important for your health and wellness. Ask your health care provider about what schedule of regular examinations is right for you. What should I know about weight and diet? Eat a Healthy Diet  Eat plenty of vegetables, fruits, whole grains, low-fat dairy products, and lean protein.  Do not eat a lot of foods high in solid fats, added sugars, or salt.  Maintain a Healthy Weight Regular exercise can help you achieve or maintain a healthy weight. You should:  Do at least 150 minutes of exercise each week. The exercise should increase your heart rate and make you sweat (moderate-intensity  exercise).  Do strength-training exercises at least twice a week. Watch Your Levels of Cholesterol and Blood Lipids  Have your blood tested for lipids and cholesterol every 5 years starting at 49 years of age. If you are at high risk for heart disease, you should start having your blood tested when you are 49 years old. You may need to have your cholesterol levels checked more often if: ? Your lipid or cholesterol levels are high. ? You are older than 49 years of age. ? You are at high risk for heart disease. What should I know about cancer screening? Many types of cancers can be detected early and may often be prevented. Lung Cancer  You should be screened every year for lung cancer if: ? You are a current smoker who has smoked for at least 30 years. ? You are a former smoker who has quit within the past 15 years.  Talk to your health care provider about your screening options, when you should start screening, and how often you should be screened. Colorectal Cancer  Routine colorectal cancer screening usually begins at 49 years of age and should be repeated every 5-10 years until you are 49 years old. You may need to be screened more often if early forms of precancerous polyps or small growths are found. Your health care provider may recommend screening at an earlier age if you have risk factors for colon cancer.  Your health care  provider may recommend using home test kits to check for hidden blood in the stool.  A small camera at the end of a tube can be used to examine your colon (sigmoidoscopy or colonoscopy). This checks for the earliest forms of colorectal cancer. Prostate and Testicular Cancer  Depending on your age and overall health, your health care provider may do certain tests to screen for prostate and testicular cancer.  Talk to your health care provider about any symptoms or concerns you have about testicular or prostate cancer. Skin Cancer  Check your skin from head  to toe regularly.  Tell your health care provider about any new moles or changes in moles, especially if: ? There is a change in a mole's size, shape, or color. ? You have a mole that is larger than a pencil eraser.  Always use sunscreen. Apply sunscreen liberally and repeat throughout the day.  Protect yourself by wearing long sleeves, pants, a wide-brimmed hat, and sunglasses when outside. What should I know about heart disease, diabetes, and high blood pressure?  If you are 36-68 years of age, have your blood pressure checked every 3-5 years. If you are 30 years of age or older, have your blood pressure checked every year. You should have your blood pressure measured twice-once when you are at a hospital or clinic, and once when you are not at a hospital or clinic. Record the average of the two measurements. To check your blood pressure when you are not at a hospital or clinic, you can use: ? An automated blood pressure machine at a pharmacy. ? A home blood pressure monitor.  Talk to your health care provider about your target blood pressure.  If you are between 76-4 years old, ask your health care provider if you should take aspirin to prevent heart disease.  Have regular diabetes screenings by checking your fasting blood sugar level. ? If you are at a normal weight and have a low risk for diabetes, have this test once every three years after the age of 76. ? If you are overweight and have a high risk for diabetes, consider being tested at a younger age or more often.  A one-time screening for abdominal aortic aneurysm (AAA) by ultrasound is recommended for men aged 21-75 years who are current or former smokers. What should I know about preventing infection? Hepatitis B If you have a higher risk for hepatitis B, you should be screened for this virus. Talk with your health care provider to find out if you are at risk for hepatitis B infection. Hepatitis C Blood testing is recommended  for:  Everyone born from [birth year] through 1966.  Anyone with known risk factors for hepatitis C. Sexually Transmitted Diseases (STDs)  You should be screened each year for STDs including gonorrhea and chlamydia if: ? You are sexually active and are younger than 49 years of age. ? You are older than 49 years of age and your health care provider tells you that you are at risk for this type of infection. ? Your sexual activity has changed since you were last screened and you are at an increased risk for chlamydia or gonorrhea. Ask your health care provider if you are at risk.  Talk with your health care provider about whether you are at high risk of being infected with HIV. Your health care provider may recommend a prescription medicine to help prevent HIV infection. What else can I do?  Schedule regular health, dental, and eye exams.  Stay current with your vaccines (immunizations).  Do not use any tobacco products, such as cigarettes, chewing tobacco, and e-cigarettes. If you need help quitting, ask your health care provider.  Limit alcohol intake to no more than 2 drinks per day. One drink equals 12 ounces of beer, 5 ounces of wine, or 1 ounces of hard liquor.  Do not use street drugs.  Do not share needles.  Ask your health care provider for help if you need support or information about quitting drugs.  Tell your health care provider if you often feel depressed.  Tell your health care provider if you have ever been abused or do not feel safe at home. This information is not intended to replace advice given to you by your health care provider. Make sure you discuss any questions you have with your health care provider. Document Released: 08/12/2007 Document Revised: 10/13/2015 Document Reviewed: 11/17/2014 Elsevier Interactive Patient Education  2019 [employer].

## 2018-05-07 ENCOUNTER — Encounter (HOSPITAL_COMMUNITY)
Admission: RE | Admit: 2018-05-07 | Discharge: 2018-05-07 | Disposition: A | Discharge status: Home / Self Care | Payer: PPO | Source: Ambulatory Visit | Attending: Emergency Medicine | Admitting: Emergency Medicine

## 2018-05-07 VITALS — Wt 320.3 lb

## 2018-05-07 DIAGNOSIS — J438 Other emphysema: Secondary | ICD-10-CM | POA: Diagnosis not present

## 2018-05-07 NOTE — Progress Notes (Signed)
Daily Session Note  Patient Details  Name: Billy [name] MRN: [id number] Date of Birth: August 22, 1970 Referring Provider:     Pulmonary Rehab Walk Test from 04/30/2018 in [place]  Referring Provider  Dr. Lamonte Sakai      Encounter Date: 05/07/2018  Check In: Session Check In - 05/07/18 1352      Check-In   Supervising physician immediately available to respond to emergencies  [facility] immediately available    Physician(s)  Dr. Jonnie Finner    Location  [facility]    Staff Present  Hoy Register, MS, Exercise Physiologist;Lisa Ysidro Evert, RN;Joan Leonia Reeves, RN, BSN;Breccan Galant, MS, ACSM RCEP, Exercise Physiologist    Medication changes reported      No    Fall or balance concerns reported     No    Tobacco Cessation  No Change    Warm-up and Cool-down  Performed as group-led instruction    Resistance Training Performed  Yes    VAD Patient?  No    PAD/SET Patient?  No      Pain Assessment   Currently in Pain?  No/denies    Pain Score  0-No pain    Multiple Pain Sites  No       Capillary Blood Glucose: No results found for this or any previous visit (from the past 24 hour(s)).    Social History   Tobacco Use  Smoking Status Former Smoker  . Packs/day: 0.50  . Years: 25.00  . Pack years: 12.50  . Types: Cigarettes  . Last attempt to quit: 03/21/2018  . Years since quitting: 0.1  Smokeless Tobacco Never Used    Goals Met:  Exercise tolerated well  Goals Unmet:  Not Applicable  Comments: Service time is from 1:30p to 3:15p    Dr. Rush Farmer is Medical Director for Pulmonary Rehab at [facility].

## 2018-05-09 ENCOUNTER — Other Ambulatory Visit: Payer: Self-pay

## 2018-05-09 ENCOUNTER — Encounter (HOSPITAL_COMMUNITY)
Admission: RE | Admit: 2018-05-09 | Discharge: 2018-05-09 | Disposition: A | Discharge status: Home / Self Care | Payer: PPO | Source: Ambulatory Visit | Attending: Emergency Medicine | Admitting: Emergency Medicine

## 2018-05-09 DIAGNOSIS — J438 Other emphysema: Secondary | ICD-10-CM

## 2018-05-09 NOTE — Progress Notes (Signed)
Daily Session Note  Patient Details  Name: Billy [name] MRN: [id number] Date of Birth: 1970/12/04 Referring Provider:     Pulmonary Rehab Walk Test from 04/30/2018 in [place]  Referring Provider  Dr. Lamonte Sakai      Encounter Date: 05/09/2018  Check In: Session Check In - 05/09/18 1325      Check-In   Supervising physician immediately available to respond to emergencies  [facility] immediately available    Physician(s)  Dr. Cathlean Sauer    Location  [facility]    Staff Present  Joycelyn Man RN, BSN;Lisa Ysidro Evert, RN;Dalton Kris Mouton, MS, Exercise Physiologist;Adell Panek, MS, ACSM RCEP, Exercise Physiologist    Medication changes reported      No    Fall or balance concerns reported     No    Tobacco Cessation  No Change    Warm-up and Cool-down  Performed as group-led instruction    Resistance Training Performed  Yes    VAD Patient?  No    PAD/SET Patient?  No      Pain Assessment   Currently in Pain?  No/denies    Pain Score  0-No pain    Multiple Pain Sites  No       Capillary Blood Glucose: No results found for this or any previous visit (from the past 24 hour(s)).    Social History   Tobacco Use  Smoking Status Former Smoker  . Packs/day: 0.50  . Years: 25.00  . Pack years: 12.50  . Types: Cigarettes  . Last attempt to quit: 03/21/2018  . Years since quitting: 0.1  Smokeless Tobacco Never Used    Goals Met:  Exercise tolerated well  Goals Unmet:  Not Applicable  Comments: Service time is from 1:30p to 3:15p    Dr. Rush Farmer is Medical Director for Pulmonary Rehab at [facility].

## 2018-05-14 ENCOUNTER — Encounter (HOSPITAL_COMMUNITY): Payer: PPO

## 2018-05-14 NOTE — Progress Notes (Addendum)
Billy [name] 48 y.o. male  DOB: [birth year] MRN: [id number]           Nutrition Note 1. Other emphysema ([place])    Past Medical History:  Diagnosis Date  . COPD (chronic obstructive pulmonary disease) ([place])   . History of bronchitis   . Obesity   . Polycythemia    a. Dr. Marin Olp.Marland KitchenMarland KitchenJAK-2 analysis is negative.  b. likely physiologic secondary to chronic hypoxia (? OSA + COPD + obesity).  c. s/p plebotomy x1  . PVD (peripheral vascular disease) ([place])   . Sleep apnea    a. mod severe by sleep study 9.30.11: AHI 44.8 per hour; oxygen desat to a nadir of 65%; unsuccessful CPAP titration; significant hypoxemia even with supp o2 at 3LPM (dest to 70-80%); needs eval for cardiopulmonary disease and upper airway obstruction  . Tobacco abuse    Meds reviewed.     Current Outpatient Medications (Respiratory):  .  albuterol (PROAIR HFA) 108 (90 Base) MCG/ACT inhaler, Inhale 2 puffs into the lungs every 6 (six) hours as needed for wheezing or shortness of breath. Marland Kitchen  ipratropium-albuterol (DUONEB) 0.5-2.5 (3) MG/3ML SOLN, Take 3 mLs by nebulization every 6 (six) hours as needed. .  SYMBICORT 160-4.5 MCG/ACT inhaler, TAKE 2 PUFFS BY MOUTH TWICE A DAY .  tiotropium (SPIRIVA) 18 MCG inhalation capsule, PLACE 1 CAPSULE INTO INHALER AND INHALE DAILY  Current Outpatient Medications (Analgesics):  .  aspirin 81 MG tablet, Take 2 tablets (161 mg total) by mouth daily.   Current Outpatient Medications (Other):  .  nicotine (NICODERM CQ - DOSED IN MG/24 HOURS) 21 mg/24hr patch, Place 21 mg onto the skin daily.   Ht: Ht Readings from Last 1 Encounters:  05/01/18 6\' 1"  (1.854 m)     Wt:  Wt Readings from Last 6 Encounters:  05/01/18 (!) 321 lb (145.6 kg)  04/29/18 (!) 321 lb 10.4 oz (145.9 kg)  04/05/18 (!) 315 lb (142.9 kg)  03/26/18 (!) 313 lb 15 oz (142.4 kg)  07/31/17 (!) 325 lb (147.4 kg)  03/19/17 (!) 319 lb (144.7 kg)     BMI: 42.35    Current tobacco use? No    Labs:  Lipid  Panel     Component Value Date/Time   CHOL 164 07/31/2017 1340   TRIG 106.0 07/31/2017 1340   HDL 40.60 07/31/2017 1340   CHOLHDL 4 07/31/2017 1340   VLDL 21.2 07/31/2017 1340   LDLCALC 103 (H) 07/31/2017 1340    Lab Results  Component Value Date   HGBA1C 5.6 07/31/2017    Nutrition Diagnosis ? Excessive sodium intake related to over consumption of processed food as evidenced by frequent consumption of convenience food/ canned vegetables and eating out frequently. ? Food-and nutrition-related knowledge deficit related to lack of exposure to information as related to diagnosis of pulmonary disease ? Overweight/obesity related to excessive energy intake as evidenced by a BMI of 42.35  Goal(s) 1. Pt to identify and limit food sources of sodium. 2. Identify food quantities necessary to achieve wt loss of  -2# per week to a goal wt loss of 2.7-10.9 kg (6-24 lb) at graduation from pulmonary rehab. 3. Pt to build a healthy plate including fruits, vegetables, whole grains, and low-fat dairy products in a healthy meal plan.   Plan:  Pt to attend Pulmonary Nutrition class Will provide client-centered nutrition education as part of interdisciplinary care.    Monitor and Evaluate progress toward nutrition goal with team.   Laurina Bustle, MS, RD,  LDN 05/14/2018 9:21 AM

## 2018-05-15 ENCOUNTER — Ambulatory Visit (INDEPENDENT_AMBULATORY_CARE_PROVIDER_SITE_OTHER): Payer: PPO

## 2018-05-15 ENCOUNTER — Encounter: Payer: Self-pay | Admitting: Adult Health

## 2018-05-15 ENCOUNTER — Other Ambulatory Visit: Payer: Self-pay

## 2018-05-15 ENCOUNTER — Ambulatory Visit (INDEPENDENT_AMBULATORY_CARE_PROVIDER_SITE_OTHER): Payer: PPO | Admitting: Adult Health

## 2018-05-15 VITALS — BP 120/80 | Temp 97.6°F | Wt 321.0 lb

## 2018-05-15 DIAGNOSIS — M25551 Pain in right hip: Secondary | ICD-10-CM | POA: Diagnosis not present

## 2018-05-15 DIAGNOSIS — M25561 Pain in right knee: Secondary | ICD-10-CM | POA: Diagnosis not present
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
## 2018-05-16 ENCOUNTER — Encounter (HOSPITAL_COMMUNITY): Payer: PPO

## 2018-05-20 ENCOUNTER — Telehealth (HOSPITAL_COMMUNITY): Payer: Self-pay

## 2018-05-21 ENCOUNTER — Encounter (HOSPITAL_COMMUNITY): Payer: PPO

## 2018-05-21 NOTE — Progress Notes (Signed)
Pulmonary Individual Treatment Plan  Patient Details  Name: Billy [name] MRN: [id number] Date of Birth: 30-Oct-1970 Referring Provider:     Pulmonary Rehab Walk Test from 04/30/2018 in [place]  Referring Provider  Dr. Lamonte Sakai      Initial Encounter Date:    Pulmonary Rehab Walk Test from 04/30/2018 in [place]  Date  04/30/18      Visit Diagnosis: Other emphysema ([place])  Patient's Home Medications on Admission:   Current Outpatient Medications:  .  albuterol (PROAIR HFA) 108 (90 Base) MCG/ACT inhaler, Inhale 2 puffs into the lungs every 6 (six) hours as needed for wheezing or shortness of breath., Disp: 1 Inhaler, Rfl: 0 .  aspirin 81 MG tablet, Take 2 tablets (161 mg total) by mouth daily., Disp: 30 tablet, Rfl: 0 .  ipratropium-albuterol (DUONEB) 0.5-2.5 (3) MG/3ML SOLN, Take 3 mLs by nebulization every 6 (six) hours as needed., Disp: 360 mL, Rfl: 0 .  nicotine (NICODERM CQ - DOSED IN MG/24 HOURS) 21 mg/24hr patch, Place 21 mg onto the skin daily., Disp: , Rfl:  .  SYMBICORT 160-4.5 MCG/ACT inhaler, TAKE 2 PUFFS BY MOUTH TWICE A DAY, Disp: 10.2 Inhaler, Rfl: 5 .  tiotropium (SPIRIVA) 18 MCG inhalation capsule, PLACE 1 CAPSULE INTO INHALER AND INHALE DAILY, Disp: 30 capsule, Rfl: 5  Past Medical History: Past Medical History:  Diagnosis Date  . COPD (chronic obstructive pulmonary disease) ([place])   . History of bronchitis   . Obesity   . Polycythemia    a. Dr. Marin Olp.Marland KitchenMarland KitchenJAK-2 analysis is negative.  b. likely physiologic secondary to chronic hypoxia (? OSA + COPD + obesity).  c. s/p plebotomy x1  . PVD (peripheral vascular disease) ([place])   . Sleep apnea    a. mod severe by sleep study 9.30.11: AHI 44.8 per hour; oxygen desat to a nadir of 65%; unsuccessful CPAP titration; significant hypoxemia even with supp o2 at 3LPM (dest to 70-80%); needs eval for cardiopulmonary disease and upper airway obstruction  .  Tobacco abuse     Tobacco Use: Social History   Tobacco Use  Smoking Status Former Smoker  . Packs/day: 0.50  . Years: 25.00  . Pack years: 12.50  . Types: Cigarettes  . Last attempt to quit: 03/21/2018  . Years since quitting: 0.1  Smokeless Tobacco Never Used    Labs: Recent Chemical engineer    Labs for ITP Cardiac and Pulmonary Rehab Latest Ref Rng & Units 11/20/2008 07/31/2017 03/23/2018   Cholestrol 0 - 200 mg/dL - 164 -   LDLCALC 0 - 99 mg/dL - 103(H) -   HDL >39.00 mg/dL - 40.60 -   Trlycerides 0.0 - 149.0 mg/dL - 106.0 -   Hemoglobin A1c 4.6 - 6.5 % - 5.6 -   PHART 7.350 - 7.450 7.338(L) - 7.304(L)   PCO2ART 32.0 - 48.0 mmHg 63.7 CRITICAL RESULT CALLED TO, READ BACK BY AND VERIFIED WITH: AMANDARASH,RN AT 1114,BY TAMMIEREADLINGRRT,RCPON9/24/10(HH) - 64.2(H)   HCO3 20.0 - 28.0 mmol/L 33.3(H) - 31.0(H)   TCO2 0 - 100 mmol/L 35.3 - -   O2SAT % 84.9 - 95.1      Capillary Blood Glucose: Lab Results  Component Value Date   GLUCAP 123 (H) 03/26/2018   GLUCAP 94 03/24/2018   GLUCAP 132 (H) 03/23/2018   GLUCAP 124 (H) 03/23/2018   GLUCAP 99 03/23/2018     Pulmonary Assessment Scores: Pulmonary Assessment Scores    Row Name 04/29/18  1631 04/30/18 1612       ADL UCSD   ADL Phase  Entry  Entry    SOB Score total  33  -      CAT Score   CAT Score  22  -      mMRC Score   mMRC Score  -  1       Pulmonary Function Assessment:   Exercise Target Goals: Exercise Program Goal: Individual exercise prescription set using results from initial 6 min walk test and THRR while considering  patient's activity barriers and safety.   Exercise Prescription Goal: Initial exercise prescription builds to 30-45 minutes a day of aerobic activity, 2-3 days per week.  Home exercise guidelines will be given to patient during program as part of exercise prescription that the participant will acknowledge.  Activity Barriers & Risk Stratification: Activity Barriers & Cardiac  Risk Stratification - 04/29/18 1412      Activity Barriers & Cardiac Risk Stratification   Activity Barriers  Shortness of Breath    Comments  some weight, wrist sore    Cardiac Risk Stratification  Low       6 Minute Walk: 6 Minute Walk    Row Name 04/30/18 1605         6 Minute Walk   Phase  Initial     Distance  1450 feet     Walk Time  6 minutes     # of Rest Breaks  0     MPH  2.74     METS  3.07     RPE  10     Perceived Dyspnea   2     Symptoms  Yes (comment)     Comments  leg fatigue     Resting HR  95 bpm     Resting BP  120/80     Resting Oxygen Saturation   92 %     Exercise Oxygen Saturation  during 6 min walk  88 %     Max Ex. HR  105 bpm     Max Ex. BP  158/70       Interval HR   1 Minute HR  104     2 Minute HR  105     3 Minute HR  103     4 Minute HR  103     5 Minute HR  102     6 Minute HR  102     2 Minute Post HR  101     Interval Heart Rate?  Yes       Interval Oxygen   Interval Oxygen?  Yes     Baseline Oxygen Saturation %  92 %     1 Minute Oxygen Saturation %  91 %     1 Minute Liters of Oxygen  0 L     2 Minute Oxygen Saturation %  90 %     2 Minute Liters of Oxygen  0 L     3 Minute Oxygen Saturation %  89 %     3 Minute Liters of Oxygen  0 L     4 Minute Oxygen Saturation %  89 %     4 Minute Liters of Oxygen  0 L     5 Minute Oxygen Saturation %  88 %     5 Minute Liters of Oxygen  0 L     6 Minute Oxygen Saturation %  88 %  6 Minute Liters of Oxygen  0 L     2 Minute Post Oxygen Saturation %  92 %     2 Minute Post Liters of Oxygen  0 L        Oxygen Initial Assessment: Oxygen Initial Assessment - 04/30/18 1609      Initial 6 min Walk   Oxygen Used  None      Program Oxygen Prescription   Program Oxygen Prescription  None       Oxygen Re-Evaluation: Oxygen Re-Evaluation    Row Name 05/20/18 0835             Program Oxygen Prescription   Program Oxygen Prescription  None         Home Oxygen   Home  Oxygen Device  Home Concentrator;E-Tanks       Sleep Oxygen Prescription  Continuous;CPAP       Liters per minute  6       Home Exercise Oxygen Prescription  None       Home at Rest Exercise Oxygen Prescription  None       Compliance with Home Oxygen Use  Yes         Goals/Expected Outcomes   Short Term Goals  To learn and demonstrate proper use of respiratory medications;To learn and demonstrate proper pursed lip breathing techniques or other breathing techniques.;To learn and understand importance of maintaining oxygen saturations>88%;To learn and understand importance of monitoring SPO2 with pulse oximeter and demonstrate accurate use of the pulse oximeter.;To learn and exhibit compliance with exercise, home and travel O2 prescription       Long  Term Goals  Demonstrates proper use of MDI's;Compliance with respiratory medication;Exhibits proper breathing techniques, such as pursed lip breathing or other method taught during program session;Maintenance of O2 saturations>88%;Verbalizes importance of monitoring SPO2 with pulse oximeter and return demonstration;Exhibits compliance with exercise, home and travel O2 prescription          Oxygen Discharge (Final Oxygen Re-Evaluation): Oxygen Re-Evaluation - 05/20/18 0835      Program Oxygen Prescription   Program Oxygen Prescription  None      Home Oxygen   Home Oxygen Device  Home Concentrator;E-Tanks    Sleep Oxygen Prescription  Continuous;CPAP    Liters per minute  6    Home Exercise Oxygen Prescription  None    Home at Rest Exercise Oxygen Prescription  None    Compliance with Home Oxygen Use  Yes      Goals/Expected Outcomes   Short Term Goals  To learn and demonstrate proper use of respiratory medications;To learn and demonstrate proper pursed lip breathing techniques or other breathing techniques.;To learn and understand importance of maintaining oxygen saturations>88%;To learn and understand importance of monitoring SPO2 with  pulse oximeter and demonstrate accurate use of the pulse oximeter.;To learn and exhibit compliance with exercise, home and travel O2 prescription    Long  Term Goals  Demonstrates proper use of MDI's;Compliance with respiratory medication;Exhibits proper breathing techniques, such as pursed lip breathing or other method taught during program session;Maintenance of O2 saturations>88%;Verbalizes importance of monitoring SPO2 with pulse oximeter and return demonstration;Exhibits compliance with exercise, home and travel O2 prescription       Initial Exercise Prescription: Initial Exercise Prescription - 04/30/18 1600      Date of Initial Exercise RX and Referring Provider   Date  04/30/18    Referring Provider  Dr. Lamonte Sakai      Bike   Level  0.4  Minutes  17      NuStep   Level  3    SPM  80    Minutes  17    METs  1.5      Track   Laps  15    Minutes  17      Prescription Details   Frequency (times per week)  2    Duration  Progress to 45 minutes of aerobic exercise without signs/symptoms of physical distress      Intensity   THRR 40-80% of Max Heartrate  69-138    Ratings of Perceived Exertion  11-13    Perceived Dyspnea  0-4      Progression   Progression  Continue to progress workloads to maintain intensity without signs/symptoms of physical distress.      Resistance Training   Training Prescription  Yes    Weight  blue bands    Reps  10-15       Perform Capillary Blood Glucose checks as needed.  Exercise Prescription Changes: Exercise Prescription Changes    Row Name 05/09/18 1141             Response to Exercise   Blood Pressure (Admit)  130/78       Blood Pressure (Exercise)  160/80       Blood Pressure (Exit)  126/84       Heart Rate (Admit)  92 bpm       Heart Rate (Exercise)  137 bpm       Heart Rate (Exit)  102 bpm       Oxygen Saturation (Admit)  90 %       Oxygen Saturation (Exercise)  86 %       Oxygen Saturation (Exit)  93 %       Rating  of Perceived Exertion (Exercise)  12       Perceived Dyspnea (Exercise)  1       Duration  Progress to 45 minutes of aerobic exercise without signs/symptoms of physical distress       Intensity  THRR unchanged         Progression   Progression  Continue to progress workloads to maintain intensity without signs/symptoms of physical distress.         Resistance Training   Training Prescription  Yes       Weight  blue bands       Reps  10-15         Treadmill   MPH  2       Grade  2       Minutes  17         Bike   Level  3       Minutes  17          Exercise Comments:   Exercise Goals and Review: Exercise Goals    Row Name 04/29/18 1413             Exercise Goals   Increase Physical Activity  Yes       Intervention  Provide advice, education, support and counseling about physical activity/exercise needs.;Develop an individualized exercise prescription for aerobic and resistive training based on initial evaluation findings, risk stratification, comorbidities and participant's personal goals.       Expected Outcomes  Short Term: Attend rehab on a regular basis to increase amount of physical activity.;Long Term: Add in home exercise to make exercise part of routine and to increase amount of physical activity.;Long  Term: Exercising regularly at least 3-5 days a week.       Increase Strength and Stamina  Yes       Intervention  Provide advice, education, support and counseling about physical activity/exercise needs.;Develop an individualized exercise prescription for aerobic and resistive training based on initial evaluation findings, risk stratification, comorbidities and participant's personal goals.       Expected Outcomes  Short Term: Increase workloads from initial exercise prescription for resistance, speed, and METs.;Short Term: Perform resistance training exercises routinely during rehab and add in resistance training at home;Long Term: Improve cardiorespiratory fitness,  muscular endurance and strength as measured by increased METs and functional capacity (6MWT)       Able to understand and use rate of perceived exertion (RPE) scale  Yes       Intervention  Provide education and explanation on how to use RPE scale       Expected Outcomes  Short Term: Able to use RPE daily in rehab to express subjective intensity level;Long Term:  Able to use RPE to guide intensity level when exercising independently       Able to understand and use Dyspnea scale  Yes       Intervention  Provide education and explanation on how to use Dyspnea scale       Expected Outcomes  Short Term: Able to use Dyspnea scale daily in rehab to express subjective sense of shortness of breath during exertion;Long Term: Able to use Dyspnea scale to guide intensity level when exercising independently       Knowledge and understanding of Target Heart Rate Range (THRR)  Yes       Intervention  Provide education and explanation of THRR including how the numbers were predicted and where they are located for reference       Expected Outcomes  Short Term: Able to state/look up THRR;Short Term: Able to use daily as guideline for intensity in rehab;Long Term: Able to use THRR to govern intensity when exercising independently       Understanding of Exercise Prescription  Yes       Intervention  Provide education, explanation, and written materials on patient's individual exercise prescription       Expected Outcomes  Short Term: Able to explain program exercise prescription;Long Term: Able to explain home exercise prescription to exercise independently       Improve claudication pain toleration; Improve walking ability  Yes       Intervention  Participate in PAD/SET Rehab 2-3 days a week, walking at home as part of exercise prescription;Attend education sessions to aid in risk factor modification and understanding of disease process wears compression socks       Expected Outcomes  Short Term: Improve walking  distance/time to onset of claudication pain;Long Term: Improve walking ability and toleration to claudication;Long Term: Improve score of PAD questionnaires          Exercise Goals Re-Evaluation : Exercise Goals Re-Evaluation    Row Name 05/20/18 [phone]             Exercise Goal Re-Evaluation   Comments  Pulmonary Rehab is closed (starting 05/13/18) until further notice due to the [facility] Virus.           Discharge Exercise Prescription (Final Exercise Prescription Changes): Exercise Prescription Changes - 05/09/18 1141      Response to Exercise   Blood Pressure (Admit)  130/78    Blood Pressure (Exercise)  160/80    Blood Pressure (Exit)  126/84    Heart Rate (Admit)  92 bpm    Heart Rate (Exercise)  137 bpm    Heart Rate (Exit)  102 bpm    Oxygen Saturation (Admit)  90 %    Oxygen Saturation (Exercise)  86 %    Oxygen Saturation (Exit)  93 %    Rating of Perceived Exertion (Exercise)  12    Perceived Dyspnea (Exercise)  1    Duration  Progress to 45 minutes of aerobic exercise without signs/symptoms of physical distress    Intensity  THRR unchanged      Progression   Progression  Continue to progress workloads to maintain intensity without signs/symptoms of physical distress.      Resistance Training   Training Prescription  Yes    Weight  blue bands    Reps  10-15      Treadmill   MPH  2    Grade  2    Minutes  17      Bike   Level  3    Minutes  17       Nutrition:  Target Goals: Understanding of nutrition guidelines, daily intake of sodium 1500mg , cholesterol 200mg , calories 30% from fat and 7% or less from saturated fats, daily to have 5 or more servings of fruits and vegetables.  Biometrics:    Nutrition Therapy Plan and Nutrition Goals: Nutrition Therapy & Goals - 05/14/18 1135      Nutrition Therapy   Diet  general healthful       Personal Nutrition Goals   Nutrition Goal  Pt to identify and limit food sources of sodium.    Personal Goal #2   Identify food quantities necessary to achieve wt loss of  -2# per week to a goal wt loss of 2.7-10.9 kg (6-24 lb) at graduation from pulmonary rehab.    Personal Goal #3  Pt to build a healthy plate including fruits, vegetables, whole grains, and low-fat dairy products in a healthy meal plan.       Intervention Plan   Intervention  Prescribe, educate and counsel regarding individualized specific dietary modifications aiming towards targeted core components such as weight, hypertension, lipid management, diabetes, heart failure and other comorbidities.    Expected Outcomes  Short Term Goal: Understand basic principles of dietary content, such as calories, fat, sodium, cholesterol and nutrients.;Long Term Goal: Adherence to prescribed nutrition plan.       Nutrition Assessments: Nutrition Assessments - 05/14/18 1137      Rate Your Plate Scores   Pre Score  66       Nutrition Goals Re-Evaluation: Nutrition Goals Re-Evaluation    Row Name 05/14/18 1135             Goals   Current Weight  320 lb 15.8 oz (145.6 kg)          Nutrition Goals Discharge (Final Nutrition Goals Re-Evaluation): Nutrition Goals Re-Evaluation - 05/14/18 1135      Goals   Current Weight  320 lb 15.8 oz (145.6 kg)       Psychosocial: Target Goals: Acknowledge presence or absence of significant depression and/or stress, maximize coping skills, provide positive support system. Participant is able to verbalize types and ability to use techniques and skills needed for reducing stress and depression.  Initial Review & Psychosocial Screening: Initial Psych Review & Screening - 04/29/18 1421      Initial Review   Current issues with  Current Sleep Concerns   unable  to stop thinking about things when going asleep     [place]?  Yes    Comments  lives with mom, good group of freinds      Barriers   Psychosocial barriers to participate in program  The patient should benefit  from training in stress management and relaxation.      Screening Interventions   Interventions  Encouraged to exercise    Expected Outcomes  Short Term goal: Identification and review with participant of any Quality of Life or Depression concerns found by scoring the questionnaire.;Long Term goal: The participant improves quality of Life and PHQ9 Scores as seen by post scores and/or verbalization of changes       Quality of Life Scores:  Scores of 19 and below usually indicate a poorer quality of life in these areas.  A difference of  2-3 points is a clinically meaningful difference.  A difference of 2-3 points in the total score of the Quality of Life Index has been associated with significant improvement in overall quality of life, self-image, physical symptoms, and general health in studies assessing change in quality of life.  PHQ-9: Recent Review Flowsheet Data    Depression screen [facility] 2/9 05/01/2018 04/29/2018 06/09/2013 03/10/2013   Decreased Interest 0 0 0 0   Down, Depressed, Hopeless 0 0 0 0   PHQ - 2 Score 0 0 0 0   Altered sleeping 1 1 - -   Tired, decreased energy 1 1 - -   Change in appetite 1 1 - -   Feeling bad or failure about yourself  0 - - -   Trouble concentrating 0 0 - -   Moving slowly or fidgety/restless 0 0 - -   Suicidal thoughts 0 0 - -   PHQ-9 Score 3 3 - -   Difficult doing work/chores - Not difficult at all - -     Interpretation of Total Score  Total Score Depression Severity:  1-4 = Minimal depression, 5-9 = Mild depression, 10-14 = Moderate depression, 15-19 = Moderately severe depression, 20-27 = Severe depression   Psychosocial Evaluation and Intervention: Psychosocial Evaluation - 05/21/18 1548      Psychosocial Evaluation & Interventions   Interventions  Encouraged to exercise with the program and follow exercise prescription;Stress management education;Relaxation education    Comments  No barriers to participation in pulmonary rehab identified     Expected Outcomes  Continues to have no barriers to participation in pulmonary rehab    Continue Psychosocial Services   No Follow up required       Psychosocial Re-Evaluation: Psychosocial Re-Evaluation    [place] Name 05/21/18 1549             Psychosocial Re-Evaluation   Current issues with  None Identified       Comments  Just started program, has attended 2 sessions, no barriers       Expected Outcomes  No barriers to participation in pulmonary rehab       Interventions  Encouraged to attend Pulmonary Rehabilitation for the exercise;Relaxation education;Stress management education       Continue Psychosocial Services   No Follow up required          Psychosocial Discharge (Final Psychosocial Re-Evaluation): Psychosocial Re-Evaluation - 05/21/18 1549      Psychosocial Re-Evaluation   Current issues with  None Identified    Comments  Just started program, has attended 2 sessions, no barriers  Expected Outcomes  No barriers to participation in pulmonary rehab    Interventions  Encouraged to attend Pulmonary Rehabilitation for the exercise;Relaxation education;Stress management education    Continue Psychosocial Services   No Follow up required       Education: Education Goals: Education classes will be provided on a weekly basis, covering required topics. Participant will state understanding/return demonstration of topics presented.  Learning Barriers/Preferences: Learning Barriers/Preferences - 04/29/18 1423      Learning Barriers/Preferences   Learning Barriers  Sight   reading glasses   Learning Preferences  Computer/Internet;Group Instruction;Individual Instruction;Skilled Demonstration;Verbal Instruction;Written Material;Video       Education Topics: Risk Factor Reduction:  -Group instruction that is supported by a PowerPoint presentation. Instructor discusses the definition of a risk factor, different risk factors for pulmonary disease, and how the heart and  lungs work together.     Nutrition for Pulmonary Patient:  -Group instruction provided by PowerPoint slides, verbal discussion, and written materials to support subject matter. The instructor gives an explanation and review of healthy diet recommendations, which includes a discussion on weight management, recommendations for fruit and vegetable consumption, as well as protein, fluid, caffeine, fiber, sodium, sugar, and alcohol. Tips for eating when patients are short of breath are discussed.   Pursed Lip Breathing:  -Group instruction that is supported by demonstration and informational handouts. Instructor discusses the benefits of pursed lip and diaphragmatic breathing and detailed demonstration on how to preform both.     Oxygen Safety:  -Group instruction provided by PowerPoint, verbal discussion, and written material to support subject matter. There is an overview of "What is Oxygen" and "Why do we need it".  Instructor also reviews how to create a safe environment for oxygen use, the importance of using oxygen as prescribed, and the risks of noncompliance. There is a brief discussion on traveling with oxygen and resources the patient may utilize.   Oxygen Equipment:  -Group instruction provided by [facility] Staff utilizing handouts, written materials, and equipment demonstrations.   Signs and Symptoms:  -Group instruction provided by written material and verbal discussion to support subject matter. Warning signs and symptoms of infection, stroke, and heart attack are reviewed and when to call the physician/911 reinforced. Tips for preventing the spread of infection discussed.   Advanced Directives:  -Group instruction provided by verbal instruction and written material to support subject matter. Instructor reviews Advanced Directive laws and proper instruction for filling out document.   Pulmonary Video:  -Group video education that reviews the importance of medication and oxygen  compliance, exercise, good nutrition, pulmonary hygiene, and pursed lip and diaphragmatic breathing for the pulmonary patient.   Exercise for the Pulmonary Patient:  -Group instruction that is supported by a PowerPoint presentation. Instructor discusses benefits of exercise, core components of exercise, frequency, duration, and intensity of an exercise routine, importance of utilizing pulse oximetry during exercise, safety while exercising, and options of places to exercise outside of rehab.     Pulmonary Medications:  -Verbally interactive group education provided by instructor with focus on inhaled medications and proper administration.   Anatomy and Physiology of the Respiratory System and Intimacy:  -Group instruction provided by PowerPoint, verbal discussion, and written material to support subject matter. Instructor reviews respiratory cycle and anatomical components of the respiratory system and their functions. Instructor also reviews differences in obstructive and restrictive respiratory diseases with examples of each. Intimacy, Sex, and Sexuality differences are reviewed with a discussion on how relationships can change when diagnosed  with pulmonary disease. Common sexual concerns are reviewed.   PULMONARY REHAB OTHER RESPIRATORY from 05/09/2018 in [place]  Date  05/09/18  Educator  RN  Instruction Review Code  1- Verbalizes Understanding      MD DAY -A group question and answer session with a medical doctor that allows participants to ask questions that relate to their pulmonary disease state.   OTHER EDUCATION -Group or individual verbal, written, or video instructions that support the educational goals of the pulmonary rehab program.   Holiday Eating Survival Tips:  -Group instruction provided by PowerPoint slides, verbal discussion, and written materials to support subject matter. The instructor gives patients tips, tricks, and techniques  to help them not only survive but enjoy the holidays despite the onslaught of food that accompanies the holidays.   Knowledge Questionnaire Score: Knowledge Questionnaire Score - 04/29/18 1632      Knowledge Questionnaire Score   Pre Score  16/18       Core Components/Risk Factors/Patient Goals at Admission: Personal Goals and Risk Factors at Admission - 04/29/18 1424      Core Components/Risk Factors/Patient Goals on Admission    Weight Management  Yes;Obesity;Weight Loss    Intervention  Obesity: Provide education and appropriate resources to help participant work on and attain dietary goals.;Weight Management/Obesity: Establish reasonable short term and long term weight goals.;Weight Management: Develop a combined nutrition and exercise program designed to reach desired caloric intake, while maintaining appropriate intake of nutrient and fiber, sodium and fats, and appropriate energy expenditure required for the weight goal.;Weight Management: Provide education and appropriate resources to help participant work on and attain dietary goals.    Admit Weight  321 lb 10.4 oz (145.9 kg)    Goal Weight: Short Term  310 lb (140.6 kg)    Goal Weight: Long Term  290 lb (131.5 kg)    Expected Outcomes  Understanding of distribution of calorie intake throughout the day with the consumption of 4-5 meals/snacks;Understanding recommendations for meals to include 15-35% energy as protein, 25-35% energy from fat, 35-60% energy from carbohydrates, less than 200mg  of dietary cholesterol, 20-35 gm of total fiber daily;Weight Loss: Understanding of general recommendations for a balanced deficit meal plan, which promotes 1-2 lb weight loss per week and includes a negative energy balance of [phone] kcal/d;Long Term: Adherence to nutrition and physical activity/exercise program aimed toward attainment of established weight goal;Short Term: Continue to assess and modify interventions until short term weight is  achieved    Improve shortness of breath with ADL's  Yes    Intervention  Provide education, individualized exercise plan and daily activity instruction to help decrease symptoms of SOB with activities of daily living.    Expected Outcomes  Short Term: Improve cardiorespiratory fitness to achieve a reduction of symptoms when performing ADLs;Long Term: Be able to perform more ADLs without symptoms or delay the onset of symptoms       Core Components/Risk Factors/Patient Goals Review:  Goals and Risk Factor Review    Row Name 05/21/18 1551             Core Components/Risk Factors/Patient Goals Review   Personal Goals Review  Weight Management/Obesity;Improve shortness of breath with ADL's;Increase knowledge of respiratory medications and ability to use respiratory devices properly.;Develop more efficient breathing techniques such as purse lipped breathing and diaphragmatic breathing and practicing self-pacing with activity.       Review  Just started program, has attended 2 sessions, unfortunately program is  closed d/t COVID-19 precautions.  Will hopefully reopen 06/18/2018.       Expected Outcomes  See admission goals          Core Components/Risk Factors/Patient Goals at Discharge (Final Review):  Goals and Risk Factor Review - 05/21/18 1551      Core Components/Risk Factors/Patient Goals Review   Personal Goals Review  Weight Management/Obesity;Improve shortness of breath with ADL's;Increase knowledge of respiratory medications and ability to use respiratory devices properly.;Develop more efficient breathing techniques such as purse lipped breathing and diaphragmatic breathing and practicing self-pacing with activity.    Review  Just started program, has attended 2 sessions, unfortunately program is closed d/t COVID-19 precautions.  Will hopefully reopen 06/18/2018.    Expected Outcomes  See admission goals       ITP Comments: ITP Comments    Row Name 04/29/18 1405           ITP  Comments  Dr. Geanie Logan Medical Director Pulmonary Rehab          Comments: ITP REVIEW Pt is making expected progress toward pulmonary rehab goals after completing 2 sessions. Recommend continued exercise, life style modification, education, and utilization of breathing techniques to increase stamina and strength and decrease shortness of breath with exertion.

## 2018-05-23 ENCOUNTER — Encounter (HOSPITAL_COMMUNITY): Payer: PPO

## 2018-05-26 DIAGNOSIS — G4733 Obstructive sleep apnea (adult) (pediatric): Secondary | ICD-10-CM | POA: Diagnosis not present

## 2018-05-28 ENCOUNTER — Encounter (HOSPITAL_COMMUNITY): Payer: PPO

## 2018-05-28 DIAGNOSIS — G4733 Obstructive sleep apnea (adult) (pediatric): Secondary | ICD-10-CM | POA: Diagnosis not present

## 2018-05-28 DIAGNOSIS — J449 Chronic obstructive pulmonary disease, unspecified: Secondary | ICD-10-CM | POA: Diagnosis not present

## 2018-05-30 ENCOUNTER — Encounter (HOSPITAL_COMMUNITY): Payer: PPO

## 2018-06-04 ENCOUNTER — Encounter (HOSPITAL_COMMUNITY): Payer: PPO

## 2018-06-06 ENCOUNTER — Encounter (HOSPITAL_COMMUNITY): Payer: PPO

## 2018-06-10 ENCOUNTER — Ambulatory Visit: Payer: PPO | Admitting: Emergency Medicine

## 2018-06-11 ENCOUNTER — Telehealth (HOSPITAL_COMMUNITY): Payer: Self-pay

## 2018-06-11 ENCOUNTER — Encounter (HOSPITAL_COMMUNITY): Payer: PPO

## 2018-06-13 ENCOUNTER — Encounter (HOSPITAL_COMMUNITY): Payer: PPO

## 2018-06-18 ENCOUNTER — Encounter (HOSPITAL_COMMUNITY): Payer: PPO

## 2018-06-20 ENCOUNTER — Encounter (HOSPITAL_COMMUNITY): Payer: PPO

## 2018-06-25 ENCOUNTER — Encounter (HOSPITAL_COMMUNITY): Payer: PPO

## 2018-06-26 DIAGNOSIS — G4733 Obstructive sleep apnea (adult) (pediatric): Secondary | ICD-10-CM | POA: Diagnosis not present

## 2018-06-27 ENCOUNTER — Encounter (HOSPITAL_COMMUNITY): Payer: PPO

## 2018-06-27 DIAGNOSIS — G4733 Obstructive sleep apnea (adult) (pediatric): Secondary | ICD-10-CM | POA: Diagnosis not present

## 2018-06-27 DIAGNOSIS — J449 Chronic obstructive pulmonary disease, unspecified: Secondary | ICD-10-CM | POA: Diagnosis not present

## 2018-07-02 ENCOUNTER — Encounter (HOSPITAL_COMMUNITY): Payer: PPO

## 2018-07-04 ENCOUNTER — Encounter (HOSPITAL_COMMUNITY): Payer: PPO

## 2018-07-08 ENCOUNTER — Encounter: Payer: Self-pay | Admitting: Family Medicine

## 2018-07-08 ENCOUNTER — Ambulatory Visit (INDEPENDENT_AMBULATORY_CARE_PROVIDER_SITE_OTHER): Payer: PPO | Admitting: Family Medicine

## 2018-07-08 ENCOUNTER — Other Ambulatory Visit: Payer: Self-pay

## 2018-07-08 ENCOUNTER — Encounter: Payer: PPO | Admitting: Family Medicine

## 2018-07-08 DIAGNOSIS — L309 Dermatitis, unspecified: Secondary | ICD-10-CM

## 2018-07-08 MED ORDER — HYDROXYZINE HCL 25 MG PO TABS: 25.0000 mg | ORAL_TABLET | Freq: Three times a day (TID) | ORAL | 0 refills | Status: DC | PRN

## 2018-07-08 MED ORDER — HYDROCORTISONE 2.5 % EX CREA: TOPICAL_CREAM | Freq: Two times a day (BID) | CUTANEOUS | 0 refills | Status: DC

## 2018-07-08 NOTE — Progress Notes (Signed)
Pt did not join webex, nor respond to doxy message for appointment.  Will try again to contact pt.

## 2018-07-08 NOTE — Progress Notes (Signed)
Virtual Visit via Video Note  I connected with Billy [name] on 07/08/18 at  1:00 PM EDT by a video enabled telemedicine application and verified that I am speaking with the correct person using two identifiers.  Location patient: home Location provider:work or home office Persons participating in the virtual visit: patient, provider  I discussed the limitations of evaluation and management by telemedicine and the availability of in person appointments. The patient expressed understanding and agreed to proceed.   HPI: Pt with a rash on L forearm that started 3 wks ago after having a bruise on his arm.  Tried cortisone cream 1%, calamine lotion, benadryl cream, Clorox bleach, and leftover prednisone (took 20 mg x 4 days).  Pt states the bleach made his arm feel worse.  "Top of arm and bottom" of arm itch.  Thought was getting rash on R side of neck, but it went away.  Pt describes the rash as small red dots and notes the linework in his tattoo is raised.  Denies incrased warmth of skin, fever, chills,  having pets at home, changes in soaps, lotions, or detergents.  Pt states in the past poison oak and other plants have not caused him problems.  Unsure if came into contact with the species of plants recently.  ROS: See pertinent positives and negatives per HPI.  Past Medical History:  Diagnosis Date  . COPD (chronic obstructive pulmonary disease) ([place])   . History of bronchitis   . Obesity   . Polycythemia    a. Dr. Marin Olp.Marland KitchenMarland KitchenJAK-2 analysis is negative.  b. likely physiologic secondary to chronic hypoxia (? OSA + COPD + obesity).  c. s/p plebotomy x1  . PVD (peripheral vascular disease) ([place])   . Sleep apnea    a. mod severe by sleep study 9.30.11: AHI 44.8 per hour; oxygen desat to a nadir of 65%; unsuccessful CPAP titration; significant hypoxemia even with supp o2 at 3LPM (dest to 70-80%); needs eval for cardiopulmonary disease and upper airway obstruction  . Tobacco abuse     Past  Surgical History:  Procedure Laterality Date  . knee injury  1982  . TONSILLECTOMY AND ADENOIDECTOMY  1972    Family History  Problem Relation Age of Onset  . Other Mother        Wegener's Disease  . Lung cancer Paternal Grandmother   . Drug abuse Father   . Alcohol abuse Father   . Asthma Father     SOCIAL HX:    Current Outpatient Medications:  .  albuterol (PROAIR HFA) 108 (90 Base) MCG/ACT inhaler, Inhale 2 puffs into the lungs every 6 (six) hours as needed for wheezing or shortness of breath., Disp: 1 Inhaler, Rfl: 0 .  aspirin 81 MG tablet, Take 2 tablets (161 mg total) by mouth daily., Disp: 30 tablet, Rfl: 0 .  ipratropium-albuterol (DUONEB) 0.5-2.5 (3) MG/3ML SOLN, Take 3 mLs by nebulization every 6 (six) hours as needed., Disp: 360 mL, Rfl: 0 .  nicotine (NICODERM CQ - DOSED IN MG/24 HOURS) 21 mg/24hr patch, Place 21 mg onto the skin daily., Disp: , Rfl:  .  SYMBICORT 160-4.5 MCG/ACT inhaler, TAKE 2 PUFFS BY MOUTH TWICE A DAY, Disp: 10.2 Inhaler, Rfl: 5 .  tiotropium (SPIRIVA) 18 MCG inhalation capsule, PLACE 1 CAPSULE INTO INHALER AND INHALE DAILY, Disp: 30 capsule, Rfl: 5  EXAM:  VITALS per patient if applicable:  GENERAL: alert, oriented, appears well and in no acute distress  HEENT: atraumatic, conjunctiva clear, no obvious abnormalities  on inspection of external nose and ears  NECK: normal movements of the head and neck  LUNGS: on inspection no signs of respiratory distress, breathing rate appears normal, no obvious gross SOB, gasping or wheezing  CV: no obvious cyanosis  MS: moves all visible extremities without noticeable abnormality  SKIN: area of erythema on L forearm in pt's tattoo, unable to visualize full details of rash 2/2 limitations of video visit.  PSYCH/NEURO: pleasant and cooperative, no obvious depression or anxiety, speech and thought processing grossly intact  ASSESSMENT AND PLAN:  Discussed the following assessment and  plan:  Dermatitis  -discussed various causes including contact dermatitis - Plan: hydrOXYzine (ATARAX/VISTARIL) 25 MG tablet, hydrocortisone 2.5 % cream -pt to f/u on Friday with pcp    I discussed the assessment and treatment plan with the patient. The patient was provided an opportunity to ask questions and all were answered. The patient agreed with the plan and demonstrated an understanding of the instructions.   The patient was advised to call back or seek an in-person evaluation if the symptoms worsen or if the condition fails to improve as anticipated.   Billie Ruddy, MD

## 2018-07-09 ENCOUNTER — Telehealth (HOSPITAL_COMMUNITY): Payer: Self-pay | Admitting: *Deleted

## 2018-07-09 ENCOUNTER — Encounter (HOSPITAL_COMMUNITY): Payer: PPO

## 2018-07-09 NOTE — Progress Notes (Signed)
Pulmonary Rehab has been closed for 2 months, and will continue to be closed indefinitely.  He is staying active at home with yard work and plans on swimming in his pool when it warms up.  Encouraged to walk short distances 5 days per week to prevent deconditioning.  We decided to discharge him from the program since it is unsure when we will reopen.  He was instructed to have his pulmonologist to reorder pulmonary rehab in a few months when we reopen since he only attended 2 exercise sessions before the program closed due to COVID-19 precautions.

## 2018-07-09 NOTE — Addendum Note (Signed)
Encounter addended by: Lance Morin, RN on: 07/09/2018 11:23 AM  Actions taken: Clinical Note Signed

## 2018-07-11 ENCOUNTER — Encounter (HOSPITAL_COMMUNITY): Payer: PPO

## 2018-07-12 ENCOUNTER — Ambulatory Visit (INDEPENDENT_AMBULATORY_CARE_PROVIDER_SITE_OTHER): Payer: PPO | Admitting: Adult Health

## 2018-07-12 ENCOUNTER — Encounter: Payer: Self-pay | Admitting: Adult Health

## 2018-07-12 ENCOUNTER — Encounter: Payer: PPO | Admitting: Adult Health

## 2018-07-12 ENCOUNTER — Other Ambulatory Visit: Payer: Self-pay

## 2018-07-12 VITALS — BP 130/86 | HR 93 | Temp 98.4°F | Wt 333.0 lb

## 2018-07-12 DIAGNOSIS — D696 Thrombocytopenia, unspecified: Secondary | ICD-10-CM | POA: Diagnosis not present

## 2018-07-12 DIAGNOSIS — L03113 Cellulitis of right upper limb: Secondary | ICD-10-CM | POA: Diagnosis not present

## 2018-07-12 MED ORDER — CEPHALEXIN 500 MG PO CAPS: 500.0000 mg | ORAL_CAPSULE | Freq: Three times a day (TID) | ORAL | 0 refills | Status: AC

## 2018-07-12 NOTE — Progress Notes (Signed)
Subjective:    Patient ID: Billy [name], male    DOB: 1970/05/07, 48 y.o.   MRN: [id number]  HPI  48 year old male who  has a past medical history of COPD (chronic obstructive pulmonary disease) ([place]), History of bronchitis, Obesity, Polycythemia, PVD (peripheral vascular disease) ([place]), Sleep apnea, and Tobacco abuse.  He presents to the office today for follow-up regarding dermatitis of right forearm.  Seen by another provider in the office 4 days ago.  Prior to being seen he reports a rash on his right forearm that started approximately 3-1/2 weeks ago after having a bruise on his arm from swinging a golf club. The bruise was on the underside originally.     At home he tried cortisone cream 1%, calamine lotion, Benadryl cream, Clorox bleach, and leftover prednisone ( took 20 mg x 4 days).  None of these remedies helped and the bleach just made his arm feel worse.  He denies any fevers, chills, changes in soaps, lotions, or detergents.  He is unsure if he came into contact with any species of plant such as poison oak or poison ivy recently.   In the initial visit 4 days ago, which is a video visit he was prescribed hydroxyzine for itching as well as a 2.5% cortisone cream.  Reports that this has not helped and feels as though the rash is getting worse and spreading. Currently reports itching and redness to the top of his right forearm, redness is starting to spread up his forearm   Review of Systems See HPI   Past Medical History:  Diagnosis Date  . COPD (chronic obstructive pulmonary disease) ([place])   . History of bronchitis   . Obesity   . Polycythemia    a. Dr. Marin Olp.Marland KitchenMarland KitchenJAK-2 analysis is negative.  b. likely physiologic secondary to chronic hypoxia (? OSA + COPD + obesity).  c. s/p plebotomy x1  . PVD (peripheral vascular disease) ([place])   . Sleep apnea    a. mod severe by sleep study 9.30.11: AHI 44.8 per hour; oxygen desat to a nadir of 65%; unsuccessful CPAP titration;  significant hypoxemia even with supp o2 at 3LPM (dest to 70-80%); needs eval for cardiopulmonary disease and upper airway obstruction  . Tobacco abuse     Social History   Socioeconomic History  . Marital status: Divorced    Spouse name: Not on file  . Number of children: 2  . Years of education: Not on file  . Highest education level: Not on file  Occupational History  . Occupation: unemployed  Social Needs  . Financial resource strain: Not hard at all  . Food insecurity:    Worry: Never true    Inability: Never true  . Transportation needs:    Medical: No    Non-medical: No  Tobacco Use  . Smoking status: Former Smoker    Packs/day: 0.50    Years: 25.00    Pack years: 12.50    Types: Cigarettes    Last attempt to quit: 03/21/2018    Years since quitting: 0.3  . Smokeless tobacco: Never Used  Substance and Sexual Activity  . Alcohol use: Yes    Alcohol/week: 76.0 standard drinks    Types: 36 Cans of beer, 40 Shots of liquor per week  . Drug use: Yes    Frequency: 1.0 times per week    Types: Marijuana    Comment: marajuana...also valium not prescribed  . Sexual activity: Not Currently  Lifestyle  .  Physical activity:    Days per week: 0 days    Minutes per session: 0 min  . Stress: To some extent  Relationships  . Social connections:    Talks on phone: Once a week    Gets together: Once a week    Attends religious service: Not on file    Active member of club or organization: No    Attends meetings of clubs or organizations: Never    Relationship status: Divorced  . Intimate partner violence:    Fear of current or ex partner: Not on file    Emotionally abused: Not on file    Physically abused: Not on file    Forced sexual activity: Not on file  Other Topics Concern  . Not on file  Social History Narrative   05/01/2018:   Lives with mother on one level   Has two 48yo sons who live nearby   Enjoys hunting/fishing   Since being hospitalized for COPD  exacerbation, has quit smoking, and is motivated to do aerobic exercise   Heavy alcohol consumer, does not see it as a problem, no interest in reducing alcohol intake.    Past Surgical History:  Procedure Laterality Date  . knee injury  1982  . TONSILLECTOMY AND ADENOIDECTOMY  1972    Family History  Problem Relation Age of Onset  . Other Mother        Wegener's Disease  . Lung cancer Paternal Grandmother   . Drug abuse Father   . Alcohol abuse Father   . Asthma Father     Allergies  Allergen Reactions  . Ativan [Lorazepam] Other (See Comments)    Pt given Ativan.  Pt became unresponsive.    Current Outpatient Medications on File Prior to Visit  Medication Sig Dispense Refill  . albuterol (PROAIR HFA) 108 (90 Base) MCG/ACT inhaler Inhale 2 puffs into the lungs every 6 (six) hours as needed for wheezing or shortness of breath. 1 Inhaler 0  . aspirin 81 MG tablet Take 2 tablets (161 mg total) by mouth daily. 30 tablet 0  . hydrocortisone 2.5 % cream Apply topically 2 (two) times daily. 30 g 0  . hydrOXYzine (ATARAX/VISTARIL) 25 MG tablet Take 1 tablet (25 mg total) by mouth 3 (three) times daily as needed. 30 tablet 0  . ipratropium-albuterol (DUONEB) 0.5-2.5 (3) MG/3ML SOLN Take 3 mLs by nebulization every 6 (six) hours as needed. 360 mL 0  . nicotine (NICODERM CQ - DOSED IN MG/24 HOURS) 21 mg/24hr patch Place 21 mg onto the skin daily.    . SYMBICORT 160-4.5 MCG/ACT inhaler TAKE 2 PUFFS BY MOUTH TWICE A DAY 10.2 Inhaler 5  . tiotropium (SPIRIVA) 18 MCG inhalation capsule PLACE 1 CAPSULE INTO INHALER AND INHALE DAILY 30 capsule 5  . [DISCONTINUED] sildenafil (VIAGRA) 50 MG tablet Take 1 tablet (50 mg total) by mouth daily as needed for erectile dysfunction. 10 tablet 0   No current facility-administered medications on file prior to visit.     BP 130/86 (BP Location: Left Arm, Patient Position: Sitting, Cuff Size: Large)   Pulse 93   Temp 98.4 F (36.9 C) (Oral)   Wt (!)  333 lb (151 kg)   SpO2 94%   BMI 43.93 kg/m       Objective:   Physical Exam Vitals signs reviewed.  Constitutional:      Appearance: Normal appearance.  Cardiovascular:     Rate and Rhythm: Normal rate and regular rhythm.  Pulses: Normal pulses.     Heart sounds: Normal heart sounds.  Pulmonary:     Effort: Pulmonary effort is normal.     Breath sounds: Normal breath sounds.  Skin:    General: Skin is warm.     Findings: Bruising, erythema and petechiae present.          Comments: Slight swelling, redness and warmth noted on right forearm.   Scattered petechiae noted around the rash.   Slight bruising noted to right underarm  Neurological:     General: No focal deficit present.     Mental Status: He is alert and oriented to person, place, and time.  Psychiatric:        Mood and Affect: Mood normal.        Behavior: Behavior normal.        Thought Content: Thought content normal.        Judgment: Judgment normal.       Assessment & Plan:  1. Cellulitis of right upper extremity -Rash is consistent with cellulitis.  Will prescribe Keflex TID x10 days. - cephALEXin (KEFLEX) 500 MG capsule; Take 1 capsule (500 mg total) by mouth 3 (three) times daily for 10 days.  Dispense: 30 capsule; Refill: 0  2. Thrombocytopenia ([place]) -Was noted that when he was in the hospital in January his platelet count was 84.  Start work-up for thrombocytopenia.  Consider referral to hematology - Iron and TIBC - HIV Antibody (routine testing w rflx) - ANA - CBC with Differential/Platelet - Comprehensive metabolic panel - Protime-INR   Dorothyann Peng, NP

## 2018-07-16 ENCOUNTER — Encounter (HOSPITAL_COMMUNITY): Payer: PPO

## 2018-07-16 LAB — ANA: Anti Nuclear Antibody (ANA): NEGATIVE

## 2018-07-16 LAB — COMPREHENSIVE METABOLIC PANEL
AG Ratio: 2 (calc) (ref 1.0–2.5)
ALT: 27 U/L (ref 9–46)
AST: 23 U/L (ref 10–40)
Albumin: 4.3 g/dL (ref 3.6–5.1)
Alkaline phosphatase (APISO): 64 U/L (ref 36–130)
BUN: 21 mg/dL (ref 7–25)
CO2: 27 mmol/L (ref 20–32)
Calcium: 9.6 mg/dL (ref 8.6–10.3)
Chloride: 104 mmol/L (ref 98–110)
Creat: 1.3 mg/dL (ref 0.60–1.35)
Globulin: 2.2 g/dL (calc) (ref 1.9–3.7)
Glucose, Bld: 96 mg/dL (ref 65–99)
Potassium: 4.4 mmol/L (ref 3.5–5.3)
Sodium: 139 mmol/L (ref 135–146)
Total Bilirubin: 0.8 mg/dL (ref 0.2–1.2)
Total Protein: 6.5 g/dL (ref 6.1–8.1)

## 2018-07-16 LAB — CBC WITH DIFFERENTIAL/PLATELET
Absolute Monocytes: 748 cells/uL (ref 200–950)
Basophils Absolute: 69 cells/uL (ref 0–200)
Basophils Relative: 0.8 %
Eosinophils Absolute: 232 cells/uL (ref 15–500)
Eosinophils Relative: 2.7 %
HCT: 42 % (ref 38.5–50.0)
Hemoglobin: 14.4 g/dL (ref 13.2–17.1)
Lymphs Abs: 1703 cells/uL (ref 850–3900)
MCH: 35.4 pg — ABNORMAL HIGH (ref 27.0–33.0)
MCHC: 34.3 g/dL (ref 32.0–36.0)
MCV: 103.2 fL — ABNORMAL HIGH (ref 80.0–100.0)
MPV: 11.2 fL (ref 7.5–12.5)
Monocytes Relative: 8.7 %
Neutro Abs: 5848 cells/uL (ref 1500–7800)
Neutrophils Relative %: 68 %
Platelets: 190 10*3/uL (ref 140–400)
RBC: 4.07 10*6/uL — ABNORMAL LOW (ref 4.20–5.80)
RDW: 13.5 % (ref 11.0–15.0)
Total Lymphocyte: 19.8 %
WBC: 8.6 10*3/uL (ref 3.8–10.8)

## 2018-07-16 LAB — IRON,?TOTAL/TOTAL IRON BINDING CAP: %SAT: 54 % (calc) — ABNORMAL HIGH (ref 20–48)

## 2018-07-16 LAB — IRON, TOTAL/TOTAL IRON BINDING CAP
Iron: 174 ug/dL (ref 50–180)
TIBC: 324 mcg/dL (calc) (ref 250–425)

## 2018-07-16 LAB — HIV ANTIBODY (ROUTINE TESTING W REFLEX): HIV 1&2 Ab, 4th Generation: NONREACTIVE

## 2018-07-16 LAB — PROTIME-INR
INR: 1
Prothrombin Time: 10 s (ref 9.0–11.5)

## 2018-07-17 ENCOUNTER — Other Ambulatory Visit: Payer: Self-pay | Admitting: Adult Health

## 2018-07-18 ENCOUNTER — Telehealth: Payer: Self-pay | Admitting: Adult Health

## 2018-07-18 ENCOUNTER — Other Ambulatory Visit: Payer: Self-pay | Admitting: Adult Health

## 2018-07-18 ENCOUNTER — Encounter (HOSPITAL_COMMUNITY): Payer: PPO

## 2018-07-18 DIAGNOSIS — R21 Rash and other nonspecific skin eruption: Secondary | ICD-10-CM

## 2018-07-18 MED ORDER — FLUCONAZOLE 150 MG PO TABS: 150.0000 mg | ORAL_TABLET | Freq: Once | ORAL | 0 refills | Status: AC

## 2018-07-18 NOTE — Telephone Encounter (Signed)
Updated patient on lab results.  Labs are normal.  He continues to have a rash on his right forearm that has not improved with Keflex.  Also reports a new rash on his stomach.  Will send to dermatology for further evaluation but will also treat with single dose of Diflucan.

## 2018-07-23 ENCOUNTER — Encounter (HOSPITAL_COMMUNITY): Payer: PPO

## 2018-07-25 ENCOUNTER — Encounter (HOSPITAL_COMMUNITY): Payer: PPO

## 2018-07-26 DIAGNOSIS — G4733 Obstructive sleep apnea (adult) (pediatric): Secondary | ICD-10-CM | POA: Diagnosis not present

## 2018-07-28 DIAGNOSIS — J449 Chronic obstructive pulmonary disease, unspecified: Secondary | ICD-10-CM | POA: Diagnosis not present

## 2018-07-28 DIAGNOSIS — G4733 Obstructive sleep apnea (adult) (pediatric): Secondary | ICD-10-CM | POA: Diagnosis not present

## 2018-07-30 ENCOUNTER — Encounter (HOSPITAL_COMMUNITY): Payer: PPO

## 2018-08-01 ENCOUNTER — Encounter (HOSPITAL_COMMUNITY): Payer: PPO

## 2018-08-01 NOTE — Addendum Note (Signed)
Encounter addended by: Ivonne Andrew, RD on: 08/01/2018 3:12 PM  Actions taken: Flowsheet data copied forward, Visit Navigator Flowsheet section accepted

## 2018-08-06 ENCOUNTER — Encounter (HOSPITAL_COMMUNITY): Payer: PPO

## 2018-08-08 ENCOUNTER — Encounter (HOSPITAL_COMMUNITY): Payer: PPO

## 2018-08-19 NOTE — Progress Notes (Signed)
Discharge Progress Report  Patient Details  Name: Billy [name] MRN: [id number] Date of Birth: 1971-02-07 Referring Provider:     Pulmonary Rehab Walk Test from 04/30/2018 in [facility]  Referring Provider  Dr. Lamonte Sakai       Number of Visits: 2  Reason for Discharge:  Early Exit:  due to program closure during covid pandemic precautions  Smoking History:  Social History   Tobacco Use  Smoking Status Former Smoker  . Packs/day: 0.50  . Years: 25.00  . Pack years: 12.50  . Types: Cigarettes  . Quit date: 03/21/2018  . Years since quitting: 0.4  Smokeless Tobacco Never Used    Diagnosis:  Other emphysema ([place])  ADL UCSD: Pulmonary Assessment Scores    Row Name 04/29/18 1631 04/30/18 1612       ADL UCSD   ADL Phase  Entry  Entry    SOB Score total  33  -      CAT Score   CAT Score  22  -      mMRC Score   mMRC Score  -  1       Initial Exercise Prescription: Initial Exercise Prescription - 04/30/18 1600      Date of Initial Exercise RX and Referring Provider   Date  04/30/18    Referring Provider  Dr. Lamonte Sakai      Bike   Level  0.4    Minutes  17      NuStep   Level  3    SPM  80    Minutes  17    METs  1.5      Track   Laps  15    Minutes  17      Prescription Details   Frequency (times per week)  2    Duration  Progress to 45 minutes of aerobic exercise without signs/symptoms of physical distress      Intensity   THRR 40-80% of Max Heartrate  69-138    Ratings of Perceived Exertion  11-13    Perceived Dyspnea  0-4      Progression   Progression  Continue to progress workloads to maintain intensity without signs/symptoms of physical distress.      Resistance Training   Training Prescription  Yes    Weight  blue bands    Reps  10-15       Discharge Exercise Prescription (Final Exercise Prescription Changes): Exercise Prescription Changes - 05/09/18 1141      Response to Exercise   Blood Pressure  (Admit)  130/78    Blood Pressure (Exercise)  160/80    Blood Pressure (Exit)  126/84    Heart Rate (Admit)  92 bpm    Heart Rate (Exercise)  137 bpm    Heart Rate (Exit)  102 bpm    Oxygen Saturation (Admit)  90 %    Oxygen Saturation (Exercise)  86 %    Oxygen Saturation (Exit)  93 %    Rating of Perceived Exertion (Exercise)  12    Perceived Dyspnea (Exercise)  1    Duration  Progress to 45 minutes of aerobic exercise without signs/symptoms of physical distress    Intensity  THRR unchanged      Progression   Progression  Continue to progress workloads to maintain intensity without signs/symptoms of physical distress.      Resistance Training   Training Prescription  Yes    Weight  blue bands  Reps  10-15      Treadmill   MPH  2    Grade  2    Minutes  17      Bike   Level  3    Minutes  17       Functional Capacity: 6 Minute Walk    Row Name 04/30/18 1605         6 Minute Walk   Phase  Initial     Distance  1450 feet     Walk Time  6 minutes     # of Rest Breaks  0     MPH  2.74     METS  3.07     RPE  10     Perceived Dyspnea   2     Symptoms  Yes (comment)     Comments  leg fatigue     Resting HR  95 bpm     Resting BP  120/80     Resting Oxygen Saturation   92 %     Exercise Oxygen Saturation  during 6 min walk  88 %     Max Ex. HR  105 bpm     Max Ex. BP  158/70       Interval HR   1 Minute HR  104     2 Minute HR  105     3 Minute HR  103     4 Minute HR  103     5 Minute HR  102     6 Minute HR  102     2 Minute Post HR  101     Interval Heart Rate?  Yes       Interval Oxygen   Interval Oxygen?  Yes     Baseline Oxygen Saturation %  92 %     1 Minute Oxygen Saturation %  91 %     1 Minute Liters of Oxygen  0 L     2 Minute Oxygen Saturation %  90 %     2 Minute Liters of Oxygen  0 L     3 Minute Oxygen Saturation %  89 %     3 Minute Liters of Oxygen  0 L     4 Minute Oxygen Saturation %  89 %     4 Minute Liters of Oxygen  0 L      5 Minute Oxygen Saturation %  88 %     5 Minute Liters of Oxygen  0 L     6 Minute Oxygen Saturation %  88 %     6 Minute Liters of Oxygen  0 L     2 Minute Post Oxygen Saturation %  92 %     2 Minute Post Liters of Oxygen  0 L        Psychological, QOL, Others - Outcomes: PHQ 2/9: Depression screen [facility] 2/9 05/01/2018 04/29/2018 06/09/2013 03/10/2013  Decreased Interest 0 0 0 0  Down, Depressed, Hopeless 0 0 0 0  PHQ - 2 Score 0 0 0 0  Altered sleeping 1 1 - -  Tired, decreased energy 1 1 - -  Change in appetite 1 1 - -  Feeling bad or failure about yourself  0 - - -  Trouble concentrating 0 0 - -  Moving slowly or fidgety/restless 0 0 - -  Suicidal thoughts 0 0 - -  PHQ-9 Score 3 3 - -  Difficult  doing work/chores - Not difficult at all - -    Quality of Life:   Personal Goals: Goals established at orientation with interventions provided to work toward goal. Personal Goals and Risk Factors at Admission - 04/29/18 1424      Core Components/Risk Factors/Patient Goals on Admission    Weight Management  Yes;Obesity;Weight Loss    Intervention  Obesity: Provide education and appropriate resources to help participant work on and attain dietary goals.;Weight Management/Obesity: Establish reasonable short term and long term weight goals.;Weight Management: Develop a combined nutrition and exercise program designed to reach desired caloric intake, while maintaining appropriate intake of nutrient and fiber, sodium and fats, and appropriate energy expenditure required for the weight goal.;Weight Management: Provide education and appropriate resources to help participant work on and attain dietary goals.    Admit Weight  321 lb 10.4 oz (145.9 kg)    Goal Weight: Short Term  310 lb (140.6 kg)    Goal Weight: Long Term  290 lb (131.5 kg)    Expected Outcomes  Understanding of distribution of calorie intake throughout the day with the consumption of 4-5 meals/snacks;Understanding  recommendations for meals to include 15-35% energy as protein, 25-35% energy from fat, 35-60% energy from carbohydrates, less than 200mg  of dietary cholesterol, 20-35 gm of total fiber daily;Weight Loss: Understanding of general recommendations for a balanced deficit meal plan, which promotes 1-2 lb weight loss per week and includes a negative energy balance of [phone] kcal/d;Long Term: Adherence to nutrition and physical activity/exercise program aimed toward attainment of established weight goal;Short Term: Continue to assess and modify interventions until short term weight is achieved    Improve shortness of breath with ADL's  Yes    Intervention  Provide education, individualized exercise plan and daily activity instruction to help decrease symptoms of SOB with activities of daily living.    Expected Outcomes  Short Term: Improve cardiorespiratory fitness to achieve a reduction of symptoms when performing ADLs;Long Term: Be able to perform more ADLs without symptoms or delay the onset of symptoms        Personal Goals Discharge: Goals and Risk Factor Review    Row Name 05/21/18 1551             Core Components/Risk Factors/Patient Goals Review   Personal Goals Review  Weight Management/Obesity;Improve shortness of breath with ADL's;Increase knowledge of respiratory medications and ability to use respiratory devices properly.;Develop more efficient breathing techniques such as purse lipped breathing and diaphragmatic breathing and practicing self-pacing with activity.       Review  Just started program, has attended 2 sessions, unfortunately program is closed d/t COVID-19 precautions.  Will hopefully reopen 06/18/2018.       Expected Outcomes  See admission goals          Exercise Goals and Review: Exercise Goals    Row Name 04/29/18 1413             Exercise Goals   Increase Physical Activity  Yes       Intervention  Provide advice, education, support and counseling about physical  activity/exercise needs.;Develop an individualized exercise prescription for aerobic and resistive training based on initial evaluation findings, risk stratification, comorbidities and participant's personal goals.       Expected Outcomes  Short Term: Attend rehab on a regular basis to increase amount of physical activity.;Long Term: Add in home exercise to make exercise part of routine and to increase amount of physical activity.;Long Term: Exercising regularly at least  3-5 days a week.       Increase Strength and Stamina  Yes       Intervention  Provide advice, education, support and counseling about physical activity/exercise needs.;Develop an individualized exercise prescription for aerobic and resistive training based on initial evaluation findings, risk stratification, comorbidities and participant's personal goals.       Expected Outcomes  Short Term: Increase workloads from initial exercise prescription for resistance, speed, and METs.;Short Term: Perform resistance training exercises routinely during rehab and add in resistance training at home;Long Term: Improve cardiorespiratory fitness, muscular endurance and strength as measured by increased METs and functional capacity (6MWT)       Able to understand and use rate of perceived exertion (RPE) scale  Yes       Intervention  Provide education and explanation on how to use RPE scale       Expected Outcomes  Short Term: Able to use RPE daily in rehab to express subjective intensity level;Long Term:  Able to use RPE to guide intensity level when exercising independently       Able to understand and use Dyspnea scale  Yes       Intervention  Provide education and explanation on how to use Dyspnea scale       Expected Outcomes  Short Term: Able to use Dyspnea scale daily in rehab to express subjective sense of shortness of breath during exertion;Long Term: Able to use Dyspnea scale to guide intensity level when exercising independently        Knowledge and understanding of Target Heart Rate Range (THRR)  Yes       Intervention  Provide education and explanation of THRR including how the numbers were predicted and where they are located for reference       Expected Outcomes  Short Term: Able to state/look up THRR;Short Term: Able to use daily as guideline for intensity in rehab;Long Term: Able to use THRR to govern intensity when exercising independently       Understanding of Exercise Prescription  Yes       Intervention  Provide education, explanation, and written materials on patient's individual exercise prescription       Expected Outcomes  Short Term: Able to explain program exercise prescription;Long Term: Able to explain home exercise prescription to exercise independently       Improve claudication pain toleration; Improve walking ability  Yes       Intervention  Participate in PAD/SET Rehab 2-3 days a week, walking at home as part of exercise prescription;Attend education sessions to aid in risk factor modification and understanding of disease process wears compression socks       Expected Outcomes  Short Term: Improve walking distance/time to onset of claudication pain;Long Term: Improve walking ability and toleration to claudication;Long Term: Improve score of PAD questionnaires          Exercise Goals Re-Evaluation: Exercise Goals Re-Evaluation    Row Name 05/20/18 [phone]             Exercise Goal Re-Evaluation   Comments  Pulmonary Rehab is closed (starting 05/13/18) until further notice due to the [facility].           Nutrition & Weight - Outcomes:    Nutrition: Nutrition Therapy & Goals - 05/14/18 1135      Nutrition Therapy   Diet  general healthful       Personal Nutrition Goals   Nutrition Goal  Pt to identify and limit food  sources of sodium.    Personal Goal #2  Identify food quantities necessary to achieve wt loss of  -2# per week to a goal wt loss of 2.7-10.9 kg (6-24 lb) at graduation from  pulmonary rehab.    Personal Goal #3  Pt to build a healthy plate including fruits, vegetables, whole grains, and low-fat dairy products in a healthy meal plan.       Intervention Plan   Intervention  Prescribe, educate and counsel regarding individualized specific dietary modifications aiming towards targeted core components such as weight, hypertension, lipid management, diabetes, heart failure and other comorbidities.    Expected Outcomes  Short Term Goal: Understand basic principles of dietary content, such as calories, fat, sodium, cholesterol and nutrients.;Long Term Goal: Adherence to prescribed nutrition plan.       Nutrition Discharge: Nutrition Assessments - 08/01/18 1512      Rate Your Plate Scores   Pre Score  66    Post Score  --   unable to assess as pt did not complete post survey      Education Questionnaire Score: Knowledge Questionnaire Score - 04/29/18 1632      Knowledge Questionnaire Score   Pre Score  16/18       Goals reviewed with patient; copy given to patient.

## 2018-08-19 NOTE — Addendum Note (Signed)
Encounter addended by: Lance Morin, RN on: 08/19/2018 9:00 AM  Actions taken: Clinical Note Signed, Episode resolved

## 2018-08-26 DIAGNOSIS — G4733 Obstructive sleep apnea (adult) (pediatric): Secondary | ICD-10-CM | POA: Diagnosis not present

## 2018-08-27 DIAGNOSIS — J449 Chronic obstructive pulmonary disease, unspecified: Secondary | ICD-10-CM | POA: Diagnosis not present

## 2018-08-27 DIAGNOSIS — G4733 Obstructive sleep apnea (adult) (pediatric): Secondary | ICD-10-CM | POA: Diagnosis not present

## 2018-09-11 ENCOUNTER — Ambulatory Visit (INDEPENDENT_AMBULATORY_CARE_PROVIDER_SITE_OTHER): Payer: PPO | Admitting: Adult Health

## 2018-09-11 ENCOUNTER — Other Ambulatory Visit: Payer: Self-pay

## 2018-09-11 ENCOUNTER — Encounter: Payer: Self-pay | Admitting: Adult Health

## 2018-09-11 DIAGNOSIS — N521 Erectile dysfunction due to diseases classified elsewhere: Secondary | ICD-10-CM | POA: Diagnosis not present

## 2018-09-11 MED ORDER — SILDENAFIL CITRATE 25 MG PO TABS: 25.0000 mg | ORAL_TABLET | Freq: Every day | ORAL | 3 refills | Status: DC | PRN

## 2018-09-11 NOTE — Progress Notes (Signed)
Virtual Visit via Video Note  I connected with Billy [name]  on 09/11/18 at  2:30 PM EDT by a video enabled telemedicine application and verified that I am speaking with the correct person using two identifiers.  Location patient: home Location provider:work or home office Persons participating in the virtual visit: patient, provider  I discussed the limitations of evaluation and management by telemedicine and the availability of in person appointments. The patient expressed understanding and agreed to proceed.   HPI: 48 year old male who  has a past medical history of COPD (chronic obstructive pulmonary disease) ([place]), History of bronchitis, Obesity, Polycythemia, PVD (peripheral vascular disease) ([place]), Sleep apnea, and Tobacco abuse.  He is being evaluated today for ED. This has been a chronic issue. He has problems with getting an erection even with masturbation. In the past it was thought to be do to smoking and obesity. He has quit smoking for the last 6 months and continues to have issues with ED.   He is wondering if he can try viagra   ROS: See pertinent positives and negatives per HPI.  Past Medical History:  Diagnosis Date  . COPD (chronic obstructive pulmonary disease) ([place])   . History of bronchitis   . Obesity   . Polycythemia    a. Dr. Marin Olp.Marland KitchenMarland KitchenJAK-2 analysis is negative.  b. likely physiologic secondary to chronic hypoxia (? OSA + COPD + obesity).  c. s/p plebotomy x1  . PVD (peripheral vascular disease) ([place])   . Sleep apnea    a. mod severe by sleep study 9.30.11: AHI 44.8 per hour; oxygen desat to a nadir of 65%; unsuccessful CPAP titration; significant hypoxemia even with supp o2 at 3LPM (dest to 70-80%); needs eval for cardiopulmonary disease and upper airway obstruction  . Tobacco abuse     Past Surgical History:  Procedure Laterality Date  . knee injury  1982  . TONSILLECTOMY AND ADENOIDECTOMY  1972    Family History  Problem Relation Age of Onset  .  Other Mother        Wegener's Disease  . Lung cancer Paternal Grandmother   . Drug abuse Father   . Alcohol abuse Father   . Asthma Father      Current Outpatient Medications:  .  albuterol (PROAIR HFA) 108 (90 Base) MCG/ACT inhaler, Inhale 2 puffs into the lungs every 6 (six) hours as needed for wheezing or shortness of breath., Disp: 1 Inhaler, Rfl: 0 .  aspirin 81 MG tablet, Take 2 tablets (161 mg total) by mouth daily., Disp: 30 tablet, Rfl: 0 .  hydrocortisone 2.5 % cream, Apply topically 2 (two) times daily., Disp: 30 g, Rfl: 0 .  hydrOXYzine (ATARAX/VISTARIL) 25 MG tablet, Take 1 tablet (25 mg total) by mouth 3 (three) times daily as needed., Disp: 30 tablet, Rfl: 0 .  ipratropium-albuterol (DUONEB) 0.5-2.5 (3) MG/3ML SOLN, Take 3 mLs by nebulization every 6 (six) hours as needed., Disp: 360 mL, Rfl: 0 .  nicotine (NICODERM CQ - DOSED IN MG/24 HOURS) 21 mg/24hr patch, Place 21 mg onto the skin daily., Disp: , Rfl:  .  SYMBICORT 160-4.5 MCG/ACT inhaler, TAKE 2 PUFFS BY MOUTH TWICE A DAY, Disp: 10.2 Inhaler, Rfl: 5 .  tiotropium (SPIRIVA) 18 MCG inhalation capsule, PLACE 1 CAPSULE INTO INHALER AND INHALE DAILY, Disp: 30 capsule, Rfl: 5  EXAM:  VITALS per patient if applicable:  GENERAL: alert, oriented, appears well and in no acute distress  HEENT: atraumatic, conjunttiva clear, no obvious abnormalities on  inspection of external nose and ears  NECK: normal movements of the head and neck  LUNGS: on inspection no signs of respiratory distress, breathing rate appears normal, no obvious gross SOB, gasping or wheezing  CV: no obvious cyanosis  MS: moves all visible extremities without noticeable abnormality  PSYCH/NEURO: pleasant and cooperative, no obvious depression or anxiety, speech and thought processing grossly intact  ASSESSMENT AND PLAN:  Discussed the following assessment and plan:  1. Erectile dysfunction due to diseases classified elsewhere - Encouraged weight  loss. Will send in generic viagra for him. Advised to start with one tab and work his way up as needed.  - Congratulated on quitting smoking  - sildenafil (VIAGRA) 25 MG tablet; Take 1 tablet (25 mg total) by mouth daily as needed for erectile dysfunction (Take 1-4 tablets as needed).  Dispense: 30 tablet; Refill: 3     I discussed the assessment and treatment plan with the patient. The patient was provided an opportunity to ask questions and all were answered. The patient agreed with the plan and demonstrated an understanding of the instructions.   The patient was advised to call back or seek an in-person evaluation if the symptoms worsen or if the condition fails to improve as anticipated.   Dorothyann Peng, NP

## 2018-09-17 ENCOUNTER — Other Ambulatory Visit: Payer: Self-pay

## 2018-09-17 ENCOUNTER — Encounter: Payer: Self-pay | Admitting: Emergency Medicine

## 2018-09-17 ENCOUNTER — Ambulatory Visit: Payer: PPO | Admitting: Emergency Medicine

## 2018-09-17 DIAGNOSIS — J9691 Respiratory failure, unspecified with hypoxia: Secondary | ICD-10-CM

## 2018-09-17 DIAGNOSIS — F17200 Nicotine dependence, unspecified, uncomplicated: Secondary | ICD-10-CM

## 2018-09-17 DIAGNOSIS — J449 Chronic obstructive pulmonary disease, unspecified: Secondary | ICD-10-CM | POA: Diagnosis not present

## 2018-09-17 DIAGNOSIS — G4733 Obstructive sleep apnea (adult) (pediatric): Secondary | ICD-10-CM

## 2018-09-17 NOTE — Assessment & Plan Note (Signed)
He stopped after his hospitalization earlier this year, has not restarted.  I congratulated him on this.  He has had some weight gain and I have cautioned him about the potential impact of this on his overall health, respiratory failure, OSA.

## 2018-09-17 NOTE — Assessment & Plan Note (Signed)
Continue Spiriva and Symbicort as you have been taking them.  Rinse and gargle after you use the Symbicort. Keep your albuterol available to use 2 puffs if needed for shortness of breath Follow with Dr Lamonte Sakai in 6 months or sooner if you have any problems

## 2018-09-17 NOTE — Assessment & Plan Note (Signed)
Great compliance based on his download information from today.  97% usage for greater than 4 hours.  AutoSet 5-20 cmH2O plus oxygen bled in.

## 2018-09-17 NOTE — Progress Notes (Signed)
  Subjective:    Patient ID: Billy [name], male    DOB: 1970-12-04, 48y.o.   MRN: [id number] HPI 48 yo male with Severe COPD, severe OSA, pulmonary HTN, hypoxemia   ROV 09/17/2018 --follow-up visit 48 year old obese gentleman with a history of severe COPD and obesity hypoventilation syndrome/OSA, chronic hypoxemia with some associated polycythemia. Currently managed on Spiriva and Symbicort, uses DuoNeb's or pro-air rarely now. He has auto-set 5-20 cmH2O CPAP, uses it every night - gets good benefit, more energy, fewer naps.  Download today shows 97% compliance for greater than 4 hours over the last month.   Intermittent compliance with his supplemental oxygen with exertion - does use it when he notes desats below 88%. He was doing Pulmonary rehab until the [place] isolation started - had to stop it. He hasn't restarted smoking!! He has gained about 14 lbs                                                                                                 Objective:   Physical Exam  Vitals:   09/17/18 1029 09/17/18 1030  BP:  124/70  Pulse:  96  Temp: 98.1 F (36.7 C)   TempSrc: Oral   SpO2:  97%  Weight: (!) 339 lb 3.7 oz (153.9 kg)   Height: 6\' 1"  (1.854 m)    Gen: Pleasant, obese, in no distress,  normal affect  ENT: No lesions,  mouth clear,  oropharynx clear, no postnasal drip  Neck: No JVD, no stridor  Lungs: clear B, no wheezes  Cardiovascular: RRR, heart sounds normal, no murmur or gallops, trace LE edema  Musculoskeletal: No deformities, no cyanosis or clubbing  Neuro: alert, non focal  Skin: some chronic venous stasis changes     Assessment & Plan:  COPD (chronic obstructive pulmonary disease) ([facility]) Continue Spiriva and Symbicort as you have been taking them.  Rinse and gargle after you use the Symbicort. Keep your albuterol available to use 2 puffs if needed for shortness of breath Follow with Dr Lamonte Sakai in 6 months or sooner if you have any problems  Obstructive  sleep apnea Great compliance based on his download information from today.  97% usage for greater than 4 hours.  AutoSet 5-20 cmH2O plus oxygen bled in.  Respiratory failure with hypoxia ([place]) Wear your oxygen with significant exertion.  Our goal is to keep your oxygen saturations greater than 88%. We may be able to consider referral back to pulmonary rehab in the next couple months as things begin to open up.  TOBACCO ABUSE He stopped after his hospitalization earlier this year, has not restarted.  I congratulated him on this.  He has had some weight gain and I have cautioned him about the potential impact of this on his overall health, respiratory failure, OSA.  Baltazar Apo, MD, PhD 09/17/2018, 10:51 AM [facility] [phone] or if no answer [phone]

## 2018-09-17 NOTE — Assessment & Plan Note (Signed)
Wear your oxygen with significant exertion.  Our goal is to keep your oxygen saturations greater than 88%. We may be able to consider referral back to pulmonary rehab in the next couple months as things begin to open up.

## 2018-09-17 NOTE — Patient Instructions (Signed)
Please continue CPAP every night as you have been wearing it. Congratulations on stopping smoking!  Do not restart Continue Spiriva and Symbicort as you have been taking them.  Rinse and gargle after you use the Symbicort. Keep your albuterol available to use 2 puffs if needed for shortness of breath Wear your oxygen with significant exertion.  Our goal is to keep your oxygen saturations greater than 88%. We may be able to consider referral back to pulmonary rehab in the next couple months as things begin to open up. Follow with Dr Lamonte Sakai in 6 months or sooner if you have any problems

## 2018-09-25 DIAGNOSIS — G4733 Obstructive sleep apnea (adult) (pediatric): Secondary | ICD-10-CM | POA: Diagnosis not present

## 2018-09-27 DIAGNOSIS — J449 Chronic obstructive pulmonary disease, unspecified: Secondary | ICD-10-CM | POA: Diagnosis not present

## 2018-09-27 DIAGNOSIS — G4733 Obstructive sleep apnea (adult) (pediatric): Secondary | ICD-10-CM | POA: Diagnosis not present

## 2018-10-03 DIAGNOSIS — L821 Other seborrheic keratosis: Secondary | ICD-10-CM | POA: Diagnosis not present

## 2018-10-03 DIAGNOSIS — L308 Other specified dermatitis: Secondary | ICD-10-CM | POA: Diagnosis not present

## 2018-10-24 ENCOUNTER — Telehealth: Payer: Self-pay | Admitting: Emergency Medicine

## 2018-10-24 MED ORDER — TIOTROPIUM BROMIDE MONOHYDRATE 18 MCG IN CAPS: ORAL_CAPSULE | RESPIRATORY_TRACT | 5 refills | Status: DC

## 2018-10-24 MED ORDER — BUDESONIDE-FORMOTEROL FUMARATE 160-4.5 MCG/ACT IN AERO: 2.0000 | INHALATION_SPRAY | Freq: Two times a day (BID) | RESPIRATORY_TRACT | 5 refills | Status: DC

## 2018-10-24 NOTE — Telephone Encounter (Signed)
Spoke with patient. He stated that he needed refills on his Spiriva and Symbicort 160. He was seen by RB last month and wasn't sure why they were not sent in during his visit. Apologized to patient and advised that I would go ahead and send in the refills. He verbalized understanding.   Nothing further needed at time of call.

## 2018-10-26 DIAGNOSIS — G4733 Obstructive sleep apnea (adult) (pediatric): Secondary | ICD-10-CM | POA: Diagnosis not present

## 2018-10-28 DIAGNOSIS — G4733 Obstructive sleep apnea (adult) (pediatric): Secondary | ICD-10-CM | POA: Diagnosis not present

## 2018-10-28 DIAGNOSIS — J449 Chronic obstructive pulmonary disease, unspecified: Secondary | ICD-10-CM | POA: Diagnosis not present

## 2018-11-27 DIAGNOSIS — G4733 Obstructive sleep apnea (adult) (pediatric): Secondary | ICD-10-CM | POA: Diagnosis not present

## 2018-11-27 DIAGNOSIS — J449 Chronic obstructive pulmonary disease, unspecified: Secondary | ICD-10-CM | POA: Diagnosis not present

## 2018-12-28 DIAGNOSIS — J449 Chronic obstructive pulmonary disease, unspecified: Secondary | ICD-10-CM | POA: Diagnosis not present

## 2018-12-28 DIAGNOSIS — G4733 Obstructive sleep apnea (adult) (pediatric): Secondary | ICD-10-CM | POA: Diagnosis not present

## 2018-12-31 ENCOUNTER — Telehealth: Payer: Self-pay | Admitting: *Deleted

## 2018-12-31 NOTE — Telephone Encounter (Signed)
Copied from [place] [phone]. Topic: General - Other >> Dec 31, 2018  1:16 PM Wynetta Emery, [place] C wrote: Reason for CRM: Pt called In to schedule a ov, pt would like to have a in office visit instead of vv. Pt says that he has had a migraine for weeks, his bp today is: 163/74. Pt would like to know if provider is able to see him sooner than scheduled? (pt has been scheduled for PCP next available ov)

## 2018-12-31 NOTE — Telephone Encounter (Signed)
Appt moved to 01/01/2019 @ 3:30.

## 2019-01-01 ENCOUNTER — Ambulatory Visit (INDEPENDENT_AMBULATORY_CARE_PROVIDER_SITE_OTHER): Payer: PPO | Admitting: Adult Health

## 2019-01-01 ENCOUNTER — Other Ambulatory Visit: Payer: Self-pay

## 2019-01-01 ENCOUNTER — Encounter: Payer: Self-pay | Admitting: Adult Health

## 2019-01-01 VITALS — BP 146/80 | HR 102 | Temp 97.6°F | Wt 322.8 lb

## 2019-01-01 DIAGNOSIS — F1010 Alcohol abuse, uncomplicated: Secondary | ICD-10-CM

## 2019-01-01 DIAGNOSIS — R519 Headache, unspecified: Secondary | ICD-10-CM | POA: Diagnosis not present

## 2019-01-01 DIAGNOSIS — I10 Essential (primary) hypertension: Secondary | ICD-10-CM | POA: Diagnosis not present

## 2019-01-01 DIAGNOSIS — Z6841 Body Mass Index (BMI) 40.0 and over, adult: Secondary | ICD-10-CM

## 2019-01-01 MED ORDER — LISINOPRIL 10 MG PO TABS: 10.0000 mg | ORAL_TABLET | Freq: Every day | ORAL | 0 refills | Status: DC

## 2019-01-01 NOTE — Patient Instructions (Signed)
I am going to start you on a low dose of lisinopril to manage your blood pressure better   I will follow up with you regarding your blood work   Please follow up in two weeks

## 2019-01-01 NOTE — Progress Notes (Signed)
Subjective:    Patient ID: Billy [name], male    DOB: June 03, 1970, 48 y.o.   MRN: [id number]  HPI  48 year old male who  has a past medical history of COPD (chronic obstructive pulmonary disease) ([place]), History of bronchitis, Obesity, Polycythemia, PVD (peripheral vascular disease) ([place]), Sleep apnea, and Tobacco abuse.   Presents to the office today for concern of headaches and elevated blood pressure readings.  He has been monitoring his blood pressure recently and has noticed blood pressure readings in the 160s over 90s.  He has had pretty much a daily headache for an unknown undisclosed amount of time.    He has been taking Goody powder, Advil, and Motrin throughout the day.  Headaches are described as dull.  Has been working on diet, has cut out most of his fried foods, fast foods, sugary foods and is eating a more balanced diet."  Fortunately he continues to drink alcohol in excess. He is starting to exercise  Wt Readings from Last 3 Encounters:  01/01/19 (!) 322 lb 12.8 oz (146.4 kg)  09/17/18 (!) 339 lb 3.7 oz (153.9 kg)  07/12/18 (!) 333 lb (151 kg)     Review of Systems See HPI   Past Medical History:  Diagnosis Date  . COPD (chronic obstructive pulmonary disease) ([place])   . History of bronchitis   . Obesity   . Polycythemia    a. Dr. Marin Olp.Marland KitchenMarland KitchenJAK-2 analysis is negative.  b. likely physiologic secondary to chronic hypoxia (? OSA + COPD + obesity).  c. s/p plebotomy x1  . PVD (peripheral vascular disease) ([place])   . Sleep apnea    a. mod severe by sleep study 9.30.11: AHI 44.8 per hour; oxygen desat to a nadir of 65%; unsuccessful CPAP titration; significant hypoxemia even with supp o2 at 3LPM (dest to 70-80%); needs eval for cardiopulmonary disease and upper airway obstruction  . Tobacco abuse     Social History   Socioeconomic History  . Marital status: Divorced    Spouse name: Not on file  . Number of children: 2  . Years of education: Not on file  .  Highest education level: Not on file  Occupational History  . Occupation: unemployed  Social Needs  . Financial resource strain: Not hard at all  . Food insecurity    Worry: Never true    Inability: Never true  . Transportation needs    Medical: No    Non-medical: No  Tobacco Use  . Smoking status: Former Smoker    Packs/day: 0.50    Years: 25.00    Pack years: 12.50    Types: Cigarettes    Quit date: 03/21/2018    Years since quitting: 0.7  . Smokeless tobacco: Never Used  Substance and Sexual Activity  . Alcohol use: Yes    Alcohol/week: 76.0 standard drinks    Types: 36 Cans of beer, 40 Shots of liquor per week  . Drug use: Yes    Frequency: 1.0 times per week    Types: Marijuana    Comment: marajuana...also valium not prescribed  . Sexual activity: Not Currently  Lifestyle  . Physical activity    Days per week: 0 days    Minutes per session: 0 min  . Stress: To some extent  Relationships  . Social connections    Talks on phone: Once a week    Gets together: Once a week    Attends religious service: Not on file    Active  member of club or organization: No    Attends meetings of clubs or organizations: Never    Relationship status: Divorced  . Intimate partner violence    Fear of current or ex partner: Not on file    Emotionally abused: Not on file    Physically abused: Not on file    Forced sexual activity: Not on file  Other Topics Concern  . Not on file  Social History Narrative   05/01/2018:   Lives with mother on one level   Has two 32yo sons who live nearby   Enjoys hunting/fishing   Since being hospitalized for COPD exacerbation, has quit smoking, and is motivated to do aerobic exercise   Heavy alcohol consumer, does not see it as a problem, no interest in reducing alcohol intake.    Past Surgical History:  Procedure Laterality Date  . knee injury  1982  . TONSILLECTOMY AND ADENOIDECTOMY  1972    Family History  Problem Relation Age of Onset  .  Other Mother        Wegener's Disease  . Lung cancer Paternal Grandmother   . Drug abuse Father   . Alcohol abuse Father   . Asthma Father     Allergies  Allergen Reactions  . Ativan [Lorazepam] Other (See Comments)    Pt given Ativan.  Pt became unresponsive.    Current Outpatient Medications on File Prior to Visit  Medication Sig Dispense Refill  . albuterol (PROAIR HFA) 108 (90 Base) MCG/ACT inhaler Inhale 2 puffs into the lungs every 6 (six) hours as needed for wheezing or shortness of breath. 1 Inhaler 0  . aspirin 81 MG tablet Take 2 tablets (161 mg total) by mouth daily. 30 tablet 0  . budesonide-formoterol (SYMBICORT) 160-4.5 MCG/ACT inhaler Inhale 2 puffs into the lungs 2 (two) times daily. 10.2 g 5  . hydrocortisone 2.5 % cream Apply topically 2 (two) times daily. 30 g 0  . hydrOXYzine (ATARAX/VISTARIL) 25 MG tablet Take 1 tablet (25 mg total) by mouth 3 (three) times daily as needed. 30 tablet 0  . ipratropium-albuterol (DUONEB) 0.5-2.5 (3) MG/3ML SOLN Take 3 mLs by nebulization every 6 (six) hours as needed. 360 mL 0  . nicotine (NICODERM CQ - DOSED IN MG/24 HOURS) 21 mg/24hr patch Place 21 mg onto the skin daily.    . sildenafil (VIAGRA) 25 MG tablet Take 1 tablet (25 mg total) by mouth daily as needed for erectile dysfunction (Take 1-4 tablets as needed). 30 tablet 3  . tiotropium (SPIRIVA) 18 MCG inhalation capsule PLACE 1 CAPSULE INTO INHALER AND INHALE DAILY 30 capsule 5   No current facility-administered medications on file prior to visit.     BP (!) 146/80 (BP Location: Left Arm, Patient Position: Sitting, Cuff Size: Large)   Pulse (!) 102   Temp 97.6 F (36.4 C) (Temporal)   Wt (!) 322 lb 12.8 oz (146.4 kg)   SpO2 95%   BMI 42.59 kg/m       Objective:   Physical Exam Vitals signs and nursing note reviewed.  Constitutional:      Appearance: He is obese.  Cardiovascular:     Rate and Rhythm: Normal rate and regular rhythm.     Pulses: Normal pulses.      Heart sounds: Normal heart sounds.  Pulmonary:     Effort: Pulmonary effort is normal.     Breath sounds: Normal breath sounds.  Abdominal:     General: Abdomen is flat.  Palpations: Abdomen is soft.  Skin:    General: Skin is warm and dry.     Capillary Refill: Capillary refill takes less than 2 seconds.  Neurological:     General: No focal deficit present.     Mental Status: He is alert and oriented to person, place, and time.  Psychiatric:        Mood and Affect: Mood normal.        Behavior: Behavior normal.        Thought Content: Thought content normal.        Judgment: Judgment normal.       Assessment & Plan:  1. Essential hypertension - Will start on low dose lisinopril and have him follow up in 2 weeks. Side effects reviewed.  - lisinopril (ZESTRIL) 10 MG tablet; Take 1 tablet (10 mg total) by mouth daily.  Dispense: 30 tablet; Refill: 0 - Basic Metabolic Panel - CBC with Differential/Platelet - Hemoglobin A1c  2. Class 3 severe obesity due to excess calories with serious comorbidity and body mass index (BMI) of 40.0 to 44.9 in adult [facility]) - Congratulated on lifestyle modifications  - Continue to work on diet and exercise   3. Alcohol abuse - Encouraged to cut back on alcohol consumption    4. Acute intractable headache, unspecified headache type - Likely due to elevated blood pressure readings.  - Advised to stop Nsaids.    Dorothyann Peng, NP

## 2019-01-02 LAB — BASIC METABOLIC PANEL
BUN: 10 mg/dL (ref 6–23)
CO2: 26 mEq/L (ref 19–32)
Calcium: 9.5 mg/dL (ref 8.4–10.5)
Chloride: 104 mEq/L (ref 96–112)
Creatinine, Ser: 1 mg/dL (ref 0.40–1.50)
GFR: 79.3 mL/min (ref >60.00)
Glucose, Bld: 106 mg/dL — ABNORMAL HIGH (ref 70–99)
Potassium: 4.2 mEq/L (ref 3.5–5.1)
Sodium: 139 mEq/L (ref 135–145)

## 2019-01-02 LAB — CBC WITH DIFFERENTIAL/PLATELET
Basophils Absolute: 0.1 10*3/uL (ref 0.0–0.1)
Basophils Relative: 0.9 % (ref 0.0–3.0)
Eosinophils Absolute: 0.2 10*3/uL (ref 0.0–0.7)
Eosinophils Relative: 2.3 % (ref 0.0–5.0)
HCT: 48.5 % (ref 39.0–52.0)
Hemoglobin: 16.4 g/dL (ref 13.0–17.0)
Lymphocytes Relative: 24.4 % (ref 12.0–46.0)
Lymphs Abs: 2 10*3/uL (ref 0.7–4.0)
MCHC: 33.8 g/dL (ref 30.0–36.0)
MCV: 104.4 fl — ABNORMAL HIGH (ref 78.0–100.0)
Monocytes Absolute: 0.7 10*3/uL (ref 0.1–1.0)
Monocytes Relative: 8.4 % (ref 3.0–12.0)
Neutro Abs: 5.3 10*3/uL (ref 1.4–7.7)
Neutrophils Relative %: 64 % (ref 43.0–77.0)
Platelets: 212 10*3/uL (ref 150.0–400.0)
RBC: 4.64 Mil/uL (ref 4.22–5.81)
RDW: 13.2 % (ref 11.5–15.5)
WBC: 8.3 10*3/uL (ref 4.0–10.5)

## 2019-01-02 LAB — HEMOGLOBIN A1C: Hgb A1c MFr Bld: 5.2 % (ref 4.6–6.5)

## 2019-01-14 ENCOUNTER — Ambulatory Visit: Payer: Self-pay | Admitting: *Deleted

## 2019-01-14 NOTE — Telephone Encounter (Signed)
Calls with high B/P readings today 137/94 and 142/91. 12 days ago began taking Lisinopril 10 mg tabs daily. Has not missed a dose. Having headaches daily. No vision changes/CP/Dizziness/Weakness/Numbness/SOB. Has an appointment tomorrow for follow up. Encouraged increase in water daily/relaxation breathing while in traffic/no added salt to diet.Discussed urgent symptoms should they occur call back or seek treatment immediately at the ED. Keep a log of readings. Stated he understood all discussed.   Reason for Disposition . [7] Systolic BP  >= 014 OR Diastolic >= 80 AND [1] taking BP medications  Answer Assessment - Initial Assessment Questions 1. BLOOD PRESSURE: "What is the blood pressure?" "Did you take at least two measurements 5 minutes apart?"     137/94 and 142/91 now, HR 87 bpm 2. ONSET: "When did you take your blood pressure?"     11:00am 3. HOW: "How did you obtain the blood pressure?" (e.g., visiting nurse, automatic home BP monitor)     Home b/p kit 4. HISTORY: "Do you have a history of high blood pressure?"    Yes,just recently 5. MEDICATIONS: "Are you taking any medications for blood pressure?" "Have you missed any doses recently?"    Yes, lisinopril 52m daily in the morning. 6. OTHER SYMPTOMS: "Do you have any symptoms?" (e.g., headache, chest pain, blurred vision, difficulty breathing, weakness)     Headache daily 7. PREGNANCY: "Is there any chance you are pregnant?" "When was your last menstrual period?"     na  Protocols used: HIGH BLOOD PRESSURE-A-AH

## 2019-01-15 ENCOUNTER — Ambulatory Visit (INDEPENDENT_AMBULATORY_CARE_PROVIDER_SITE_OTHER): Payer: PPO | Admitting: Adult Health

## 2019-01-15 ENCOUNTER — Encounter: Payer: Self-pay | Admitting: Adult Health

## 2019-01-15 ENCOUNTER — Other Ambulatory Visit: Payer: Self-pay

## 2019-01-15 DIAGNOSIS — I10 Essential (primary) hypertension: Secondary | ICD-10-CM | POA: Diagnosis not present

## 2019-01-15 MED ORDER — LISINOPRIL 20 MG PO TABS: 20.0000 mg | ORAL_TABLET | Freq: Every day | ORAL | 0 refills | Status: DC

## 2019-01-15 NOTE — Progress Notes (Signed)
Virtual Visit via Telephone Note  I connected with Billy [name] on 01/15/19 at  9:30 AM EST by telephone and verified that I am speaking with the correct person using two identifiers.   I discussed the limitations, risks, security and privacy concerns of performing an evaluation and management service by telephone and the availability of in person appointments. I also discussed with the patient that there may be a patient responsible charge related to this service. The patient expressed understanding and agreed to proceed.  Location patient: home Location provider: work or home office Participants present for the call: patient, provider Patient did not have a visit in the prior 7 days to address this/these issue(s).   History of Present Illness: 48 year old male who is being evaluated today for follow-up regarding hypertension.  2 weeks ago he was started on lisinopril 10 mg for elevated blood pressure readings at home.  At home he reported readings of 160s over 100s.  He has only been checking his blood pressure over the last 2 days and reports that his average blood pressure is about 145/90.  He did have one episode of a blood pressure of 160/116, this was after becoming aggravated during 5:00 traffic.  He rechecked his blood pressure 30 minutes later and it was down into the 140s over 90s.  Continues to have episodic headaches when his blood pressure is on the higher range.  He denies side effects such as a dry cough.   Observations/Objective: Patient sounds cheerful and well on the phone. I do not appreciate any SOB. Speech and thought processing are grossly intact. Patient reported vitals:  Assessment and Plan: 1. Essential hypertension -We will increase lisinopril to 20 mg.  He was advised to monitor his blood pressures more frequently, twice a day.  He will follow-up in 2 weeks. - lisinopril (ZESTRIL) 20 MG tablet; Take 1 tablet (20 mg total) by mouth daily.  Dispense: 30  tablet; Refill: 0   Follow Up Instructions:  I did not refer this patient for an OV in the next 24 hours for this/these issue(s).  I discussed the assessment and treatment plan with the patient. The patient was provided an opportunity to ask questions and all were answered. The patient agreed with the plan and demonstrated an understanding of the instructions.   The patient was advised to call back or seek an in-person evaluation if the symptoms worsen or if the condition fails to improve as anticipated.  I provided 15 minutes of non-face-to-face time during this encounter.   Dorothyann Peng, NP

## 2019-01-16 ENCOUNTER — Ambulatory Visit: Payer: PPO | Admitting: Adult Health

## 2019-01-27 DIAGNOSIS — J449 Chronic obstructive pulmonary disease, unspecified: Secondary | ICD-10-CM | POA: Diagnosis not present

## 2019-01-27 DIAGNOSIS — G4733 Obstructive sleep apnea (adult) (pediatric): Secondary | ICD-10-CM | POA: Diagnosis not present

## 2019-02-13 ENCOUNTER — Other Ambulatory Visit: Payer: Self-pay | Admitting: Adult Health

## 2019-02-13 DIAGNOSIS — I10 Essential (primary) hypertension: Secondary | ICD-10-CM

## 2019-02-13 NOTE — Telephone Encounter (Signed)
He was supposed to follow up in two weeks and let me know how his BP was doing

## 2019-02-14 NOTE — Telephone Encounter (Signed)
Left a message for a return call.

## 2019-02-14 NOTE — Telephone Encounter (Signed)
Spoke to the pt.  He states his systolic has been [id number] or 130s over 70s.  Please advise.

## 2019-02-14 NOTE — Telephone Encounter (Signed)
Pt called back. Please advise.

## 2019-02-15 ENCOUNTER — Other Ambulatory Visit: Payer: Self-pay | Admitting: Adult Health

## 2019-02-15 DIAGNOSIS — I10 Essential (primary) hypertension: Secondary | ICD-10-CM

## 2019-02-18 NOTE — Telephone Encounter (Signed)
DENIED.  THIS WAS SENT TO THE PHARMACY ON 02/14/2019.

## 2019-02-27 DIAGNOSIS — J449 Chronic obstructive pulmonary disease, unspecified: Secondary | ICD-10-CM | POA: Diagnosis not present

## 2019-02-27 DIAGNOSIS — G4733 Obstructive sleep apnea (adult) (pediatric): Secondary | ICD-10-CM | POA: Diagnosis not present

## 2019-02-28 DIAGNOSIS — G4733 Obstructive sleep apnea (adult) (pediatric): Secondary | ICD-10-CM | POA: Diagnosis not present

## 2019-02-28 DIAGNOSIS — J449 Chronic obstructive pulmonary disease, unspecified: Secondary | ICD-10-CM | POA: Diagnosis not present

## 2019-03-01 ENCOUNTER — Other Ambulatory Visit: Payer: Self-pay | Admitting: Emergency Medicine

## 2019-03-01 NOTE — Telephone Encounter (Signed)
Called in to answering service for a refill of rescue inhaler  Prescription called into pharmacy

## 2019-03-17 ENCOUNTER — Other Ambulatory Visit: Payer: Self-pay

## 2019-03-17 ENCOUNTER — Telehealth: Payer: Self-pay | Admitting: Emergency Medicine

## 2019-03-17 DIAGNOSIS — J449 Chronic obstructive pulmonary disease, unspecified: Secondary | ICD-10-CM

## 2019-03-17 DIAGNOSIS — G4733 Obstructive sleep apnea (adult) (pediatric): Secondary | ICD-10-CM

## 2019-03-17 DIAGNOSIS — J9691 Respiratory failure, unspecified with hypoxia: Secondary | ICD-10-CM

## 2019-03-17 NOTE — Telephone Encounter (Signed)
I called and spoke with the patient and made him aware that I will be placing an order.

## 2019-03-17 NOTE — Progress Notes (Signed)
Due to Dr. Lamonte Sakai being out of the office. I am doing a new order so that it can be signed and sent to [place].

## 2019-03-30 DIAGNOSIS — G4733 Obstructive sleep apnea (adult) (pediatric): Secondary | ICD-10-CM | POA: Diagnosis not present

## 2019-03-30 DIAGNOSIS — J449 Chronic obstructive pulmonary disease, unspecified: Secondary | ICD-10-CM | POA: Diagnosis not present

## 2019-04-10 ENCOUNTER — Telehealth: Payer: Self-pay | Admitting: Adult Health

## 2019-04-10 NOTE — Chronic Care Management (AMB) (Signed)
  Chronic Care Management   Note  04/10/2019 Name: Billy [name] MRN: [id number] DOB: 1970-04-30  Billy [name] is a 49 y.o. year old male who is a primary care patient of Dorothyann Peng, NP. I reached out to [name] by phone today in response to a referral sent by Billy [name]'s PCP, Dorothyann Peng, NP.   Billy [name] was given information about Chronic Care Management services today including:  1. CCM service includes personalized support from designated clinical staff supervised by his physician, including individualized plan of care and coordination with other care providers 2. 24/7 contact phone numbers for assistance for urgent and routine care needs. 3. Service will only be billed when office clinical staff spend 20 minutes or more in a month to coordinate care. 4. Only one practitioner may furnish and bill the service in a calendar month. 5. The patient may stop CCM services at any time (effective at the end of the month) by phone call to the office staff. 6. The patient will be responsible for cost sharing (co-pay) of up to 20% of the service fee (after annual deductible is met).  Patient agreed to services and verbal consent obtained.   Follow up plan:   Raynicia Dukes [employer] Scheduler

## 2019-04-14 ENCOUNTER — Other Ambulatory Visit: Payer: Self-pay | Admitting: Emergency Medicine

## 2019-04-14 ENCOUNTER — Telehealth: Payer: Self-pay

## 2019-04-14 DIAGNOSIS — I739 Peripheral vascular disease, unspecified: Secondary | ICD-10-CM

## 2019-04-14 DIAGNOSIS — G4733 Obstructive sleep apnea (adult) (pediatric): Secondary | ICD-10-CM

## 2019-04-14 NOTE — Telephone Encounter (Signed)
I am requesting an ambulatory referral to CCM for [employer]. Referral will need to have 2 current diagnosis attached. Thank you.

## 2019-04-15 ENCOUNTER — Other Ambulatory Visit: Payer: Self-pay | Admitting: Adult Health

## 2019-04-15 DIAGNOSIS — J449 Chronic obstructive pulmonary disease, unspecified: Secondary | ICD-10-CM

## 2019-04-15 DIAGNOSIS — I739 Peripheral vascular disease, unspecified: Secondary | ICD-10-CM

## 2019-04-15 NOTE — Telephone Encounter (Signed)
This has been taking care of.

## 2019-04-16 ENCOUNTER — Ambulatory Visit: Payer: PPO

## 2019-04-16 ENCOUNTER — Other Ambulatory Visit: Payer: Self-pay

## 2019-04-16 DIAGNOSIS — I739 Peripheral vascular disease, unspecified: Secondary | ICD-10-CM

## 2019-04-16 DIAGNOSIS — J449 Chronic obstructive pulmonary disease, unspecified: Secondary | ICD-10-CM

## 2019-04-16 NOTE — Chronic Care Management (AMB) (Signed)
Chronic Care Management Pharmacy  Name: Billy [name]  MRN: [id number] DOB: 15-Dec-1970  Initial Questions: 1. Have you seen any other providers since your last visit? No  2. Any changes in your medicines or health? No   Chief Complaint/ HPI Theseus [name],  49 y.o. , male presents for their Initial CCM visit with Billy clinical pharmacist via telephone.   PCP : Dorothyann Peng, NP  Their chronic conditions include: HTN, Polycythemia, obstructive sleep apnea, COPD, History of tobacco use, Alcohol abuse   Office Visits: 01/15/2019- Patient presented to Dorothyann Peng, NP for HTN follow up. Patient was started on lisinopril 10mg  daily for elevated BP 2 weeks ago. Lisinopril increased to 20mg  once daily and patient was advised to monitor BP twice daily and follow up in 2 weeks.   01/01/2019- Patient presented in office to Dorothyann Peng, NP for elevated BP and headaches. Patient started on lisinopril 10mg . Encouraged patient to cut back on alcohol consumption. Patient was advised to stop taking NSAIDs and headache probably due to elevated blood pressure readings.   09/11/2018- Patient presented via virtual to Dorothyann Peng, NP for erectile dysfunction. Patient started on Viagra 25mg  (1-4 tablets as needed).   07/12/2018- Patient presented via virtual to Dorothyann Peng, NP for rash. Patient presented with rash consistent with cellulitis and was prescribed Keflex 500mg , TID. Platelet count noted to be 84. Lab work ordered for work-up for thrombocytopenia. (Iron, TIBC; HIV antibody, ANA, CBC, CMP, Protime-INR). Considering referral to hematology.   Consult Visit: 10/03/2018- Dermatology- Patient presented to Lennie Odor for rash. (notes not available).   09/17/2018- Pulmonology- Patient presented to Dr. Larita Fife, MD for COPD follow up. Patient to continue Spiriva and Symbicort. Patient states improvement on sleep and using CPAP. Patient stopped tobacco use after hospitalization  earlier this year and has not restarted.   Medications: Outpatient Encounter Medications as of 04/16/2019  Medication Sig  . albuterol (VENTOLIN HFA) 108 (90 Base) MCG/ACT inhaler INHALE 2 PUFFS INTO Billy LUNGS EVERY 6 HOURS AS NEEDED FOR SHORTNESS OF BREATH  . aspirin 81 MG tablet Take 2 tablets (161 mg total) by mouth daily.  . budesonide-formoterol (SYMBICORT) 160-4.5 MCG/ACT inhaler TAKE 2 PUFFS BY MOUTH TWICE A DAY  . lisinopril (ZESTRIL) 20 MG tablet TAKE 1 TABLET BY MOUTH EVERY DAY  . tiotropium (SPIRIVA) 18 MCG inhalation capsule PLACE 1 CAPSULE INTO INHALER AND INHALE DAILY  . hydrocortisone 2.5 % cream Apply topically 2 (two) times daily. (Patient not taking: Reported on 04/16/2019)  . hydrOXYzine (ATARAX/VISTARIL) 25 MG tablet Take 1 tablet (25 mg total) by mouth 3 (three) times daily as needed. (Patient not taking: Reported on 04/16/2019)  . ipratropium-albuterol (DUONEB) 0.5-2.5 (3) MG/3ML SOLN Take 3 mLs by nebulization every 6 (six) hours as needed. (Patient not taking: Reported on 04/16/2019)  . nicotine (NICODERM CQ - DOSED IN MG/24 HOURS) 21 mg/24hr patch Place 21 mg onto Billy skin daily.  . sildenafil (VIAGRA) 25 MG tablet Take 1 tablet (25 mg total) by mouth daily as needed for erectile dysfunction (Take 1-4 tablets as needed). (Patient not taking: Reported on 04/16/2019)   No facility-administered encounter medications on file as of 04/16/2019.     Current Diagnosis/Assessment:  Goals Addressed            This Visit's Progress   . Pharmacy Care Plan       Current Barriers:  . Chronic Disease Management support, education, and care coordination needs related to HTN, COPD, and History  of tobacco use, Alcohol use, Obstructive sleep apnea, Polycythemia   Pharmacist Clinical Goal(s):  Marland Kitchen Maintain Blood pressure <130/80 mmHg  . Continue working on lifestyle modifications (diet/exercise). . Stay active, engage in at least 150 minutes per week of moderate-intensity exercise  such as brisk walking (15- to 20-minute mile) or something similar.  . Continue with diet modifications (cutting back in sweets, portion control).  .  Interventions: . Comprehensive medication review performed. . Discussed diet and exercise modifications and effect on blood pressure.  . Discussed lifestyle modifications. (include nuts, legumes, vegetables, fruits in diet; aim for 30 minutes three times a week of physical activity).  Patient Self Care Activities:  . Calls provider office for new concerns or questions . Continue current medications as directed by providers.  . Continue at home blood pressure readings.  Initial goal documentation       COPD / Tobacco  Eosinophil count:   Lab Results  Component Value Date/Time   EOSPCT 2.3 01/01/2019 04:08 PM   EOSPCT 2.5 12/16/2014 02:07 PM  %                               Eos (Absolute):  Lab Results  Component Value Date/Time   EOSABS 0.2 01/01/2019 04:08 PM   EOSABS 0.2 12/16/2014 02:07 PM    Tobacco Status:  Social History   Tobacco Use  Smoking Status Former Smoker  . Packs/day: 0.50  . Years: 25.00  . Pack years: 12.50  . Types: Cigarettes  . Quit date: 03/21/2018  . Years since quitting: 1.0  Smokeless Tobacco Never Used   Tobacco use: not using nicotine patches anymore.   Patient has failed these meds in past: Advair   Patient is currently controlled on Billy following medications:  -Albuterol HFA, 2 puffs every 6 hours as needed for shortness of breath  -Symbicort, 2 puffs twice daily,  -Spiriva 38mcg, 1 capsule into inhaler and inhaler daily  - Duoneb, 1 vial via nebulizer every 6 hours as needed (has not used since discharged from hospital)   Using maintenance inhaler regularly? Yes Frequency of rescue inhaler use:  infrequently (patient mentions only refilling albuterol inhaler twice Billy past year. Breathing improved since he quit smoking on Mar 21, 2018.)    We discussed:  proper inhaler technique.  (Rinsing mouth with water and spitting out after each use of Symbicort).   Plan Breathing improved since tobacco cessation. Continue current medications.  ,  Hypertension  BP today is:  <130/80  Office blood pressures are  BP Readings from Last 3 Encounters:  01/01/19 (!) 146/80  09/17/18 124/70  07/12/18 130/86   Patient has failed these meds in Billy past: none   Patient checks BP at home twice daily  Patient home BP readings are ranging: 124/68 mmHg.   Patient controlled on: lisinopril 20mg , 1 tablet once daily.   We discussed diet and exercise extensively.   -- DASH diet: patient states he was able to cut back on sweets and is aware of portion control. He also avoids eating late.  - Exercise: patient aims to walk 1 to 1.5 miles at least 3 times per week.  - Discussed minimizing alcohol intake (patient not interested at Billy moment).   Plan Denies dizziness/l ightheadedness. Patient mentions headaches resolving since dose of lisinopril was increased.  Continue current medications and control with diet and exercise.   Will reassess alcohol intake at follow up.  Erectile Dysfunction  Patient has failed these meds in past: none   Patient is currently managed on Billy following medications: Viagra 25mg , (take 1 to 4 tablets as needed).   Plan Patient currently not taking.    Polycythemia   Patient has failed these meds in past: none  Patient is currently manged on Billy following medications: ASA 81mg , 1 tablet daily.   01/01/2019 Hgb: 16.4 Hct: 48.5 Plt: 212  - Patient mentions being placed by previous providers due to thick blood and bc patient smoked. Denies issues with bleeding.   Plan Continue current medications.    Rash  Patient has failed these meds in past: Keflex, Diflucan Patient is currently controlled on Billy following medications: Hydrocortisone 2.5% cream as needed, Hydroxyzine 25mg , 1 tablet TID, (currently not using it).   Plan Managed by  dermatologist. Patient currently not taking regimen as he states rash has subsided.  Continue as is.   Medication Management   Patient reports no issues obtaining medications.    Follow up Follow up in 1 year.   Anson Crofts, PharmD Clinical Pharmacist [facility] [phone]

## 2019-04-16 NOTE — Patient Instructions (Addendum)
Visit Information  Goals Addressed            This Visit's Progress   . Pharmacy Care Plan       Current Barriers:  . Chronic Disease Management support, education, and care coordination needs related to HTN, COPD, and History of tobacco use, Alcohol use, Obstructive sleep apnea, Polycythemia   Pharmacist Clinical Goal(s):  Marland Kitchen Maintain Blood pressure <130/80 mmHg  . Continue working on lifestyle modifications (diet/exercise). . Stay active, engage in at least 150 minutes per week of moderate-intensity exercise such as brisk walking (15- to 20-minute mile) or something similar.  . Continue with diet modifications (cutting back in sweets, portion control).  .  Interventions: . Comprehensive medication review performed. . Discussed diet and exercise modifications and effect on blood pressure.  . Discussed lifestyle modifications. (include nuts, legumes, vegetables, fruits in diet; aim for 30 minutes three times a week of physical activity).  Patient Self Care Activities:  . Calls provider office for new concerns or questions . Continue current medications as directed by providers.  . Continue at home blood pressure readings.  Initial goal documentation        Mr. Retzloff was given information about Chronic Care Management services today including:  1. CCM service includes personalized support from designated clinical staff supervised by his physician, including individualized plan of care and coordination with other care providers 2. 24/7 contact phone numbers for assistance for urgent and routine care needs. 3. Service will only be billed when office clinical staff spend 20 minutes or more in a month to coordinate care. 4. Only one practitioner may furnish and bill the service in a calendar month. 5. The patient may stop CCM services at any time (effective at the end of the month) by phone call to the office staff. 6. The patient will be responsible for cost sharing (co-pay) of  up to 20% of the service fee (after annual deductible is met).  Patient agreed to services and verbal consent obtained.   Print copy of patient instructions provided.  Telephone follow up appointment with pharmacy team member scheduled for: 04/15/2020  Anson Crofts, PharmD Clinical Pharmacist [facility] [place] [phone]   [place] stands for "Dietary Approaches to Stop Hypertension." The DASH eating plan is a healthy eating plan that has been shown to reduce high blood pressure (hypertension). It may also reduce your risk for type 2 diabetes, heart disease, and stroke. The DASH eating plan may also help with weight loss. What are tips for following this plan?  General guidelines  Avoid eating more than 2,300 mg (milligrams) of salt (sodium) a day. If you have hypertension, you may need to reduce your sodium intake to 1,500 mg a day.  Limit alcohol intake to no more than 1 drink a day for nonpregnant women and 2 drinks a day for men. One drink equals 12 oz of beer, 5 oz of wine, or 1 oz of hard liquor.  Work with your health care provider to maintain a healthy body weight or to lose weight. Ask what an ideal weight is for you.  Get at least 30 minutes of exercise that causes your heart to beat faster (aerobic exercise) most days of the week. Activities may include walking, swimming, or biking.  Work with your health care provider or diet and nutrition specialist (dietitian) to adjust your eating plan to your individual calorie needs. Reading food labels   Check food labels for the  amount of sodium per serving. Choose foods with less than 5 percent of the Daily Value of sodium. Generally, foods with less than 300 mg of sodium per serving fit into this eating plan.  To find whole grains, look for the word "whole" as the first word in the ingredient list. Shopping  Buy products labeled as "low-sodium" or "no salt added."  Buy fresh foods.  Avoid canned foods and premade or frozen meals. Cooking  Avoid adding salt when cooking. Use salt-free seasonings or herbs instead of table salt or sea salt. Check with your health care provider or pharmacist before using salt substitutes.  Do not fry foods. Cook foods using healthy methods such as baking, boiling, grilling, and broiling instead.  Cook with heart-healthy oils, such as olive, canola, soybean, or sunflower oil. Meal planning  Eat a balanced diet that includes: ? 5 or more servings of fruits and vegetables each day. At each meal, try to fill half of your plate with fruits and vegetables. ? Up to 6-8 servings of whole grains each day. ? Less than 6 oz of lean meat, poultry, or fish each day. A 3-oz serving of meat is about the same size as a deck of cards. One egg equals 1 oz. ? 2 servings of low-fat dairy each day. ? A serving of nuts, seeds, or beans 5 times each week. ? Heart-healthy fats. Healthy fats called Omega-3 fatty acids are found in foods such as flaxseeds and coldwater fish, like sardines, salmon, and mackerel.  Limit how much you eat of the following: ? Canned or prepackaged foods. ? Food that is high in trans fat, such as fried foods. ? Food that is high in saturated fat, such as fatty meat. ? Sweets, desserts, sugary drinks, and other foods with added sugar. ? Full-fat dairy products.  Do not salt foods before eating.  Try to eat at least 2 vegetarian meals each week.  Eat more home-cooked food and less restaurant, buffet, and fast food.  When eating at a restaurant, ask that your food be prepared with less salt or no salt, if possible. What foods are recommended? The items listed may not be a complete list. Talk with your dietitian about what dietary choices are best for you. Grains Whole-grain or whole-wheat bread. Whole-grain or whole-wheat pasta. Brown rice. [place]. Bulgur. Whole-grain and low-sodium cereals. Pita bread. Low-fat, low-sodium  crackers. Whole-wheat flour tortillas. Vegetables Fresh or frozen vegetables (raw, steamed, roasted, or grilled). Low-sodium or reduced-sodium tomato and vegetable juice. Low-sodium or reduced-sodium tomato sauce and tomato paste. Low-sodium or reduced-sodium canned vegetables. Fruits All fresh, dried, or frozen fruit. Canned fruit in natural juice (without added sugar). Meat and other protein foods Skinless chicken or [place]. Ground chicken or [place]. Pork with fat trimmed off. Fish and seafood. Egg whites. Dried beans, peas, or lentils. Unsalted nuts, nut butters, and seeds. Unsalted canned beans. Lean cuts of beef with fat trimmed off. Low-sodium, lean deli meat. Dairy Low-fat (1%) or fat-free (skim) milk. Fat-free, low-fat, or reduced-fat cheeses. Nonfat, low-sodium ricotta or cottage cheese. Low-fat or nonfat yogurt. Low-fat, low-sodium cheese. Fats and oils Soft margarine without trans fats. Vegetable oil. Low-fat, reduced-fat, or light mayonnaise and salad dressings (reduced-sodium). Canola, safflower, olive, soybean, and sunflower oils. Avocado. Seasoning and other foods Herbs. Spices. Seasoning mixes without salt. Unsalted popcorn and pretzels. Fat-free sweets. What foods are not recommended? The items listed may not be a complete list. Talk with your dietitian about what dietary choices are best  for you. Grains Baked goods made with fat, such as croissants, muffins, or some breads. Dry pasta or rice meal packs. Vegetables Creamed or fried vegetables. Vegetables in a cheese sauce. Regular canned vegetables (not low-sodium or reduced-sodium). Regular canned tomato sauce and paste (not low-sodium or reduced-sodium). Regular tomato and vegetable juice (not low-sodium or reduced-sodium). Angie Fava. Olives. Fruits Canned fruit in a light or heavy syrup. Fried fruit. Fruit in cream or butter sauce. Meat and other protein foods Fatty cuts of meat. Ribs. Fried meat. Berniece Salines. Sausage. Bologna and  other processed lunch meats. Salami. Fatback. Hotdogs. Bratwurst. Salted nuts and seeds. Canned beans with added salt. Canned or smoked fish. Whole eggs or egg yolks. Chicken or [place] with skin. Dairy Whole or 2% milk, cream, and half-and-half. Whole or full-fat cream cheese. Whole-fat or sweetened yogurt. Full-fat cheese. Nondairy creamers. Whipped toppings. Processed cheese and cheese spreads. Fats and oils Butter. Stick margarine. Lard. Shortening. Ghee. Bacon fat. Tropical oils, such as coconut, palm kernel, or palm oil. Seasoning and other foods Salted popcorn and pretzels. Onion salt, garlic salt, seasoned salt, table salt, and sea salt. Worcestershire sauce. Tartar sauce. Barbecue sauce. Teriyaki sauce. Soy sauce, including reduced-sodium. Steak sauce. Canned and packaged gravies. Fish sauce. Oyster sauce. Cocktail sauce. Horseradish that you find on the shelf. Ketchup. Mustard. Meat flavorings and tenderizers. Bouillon cubes. Hot sauce and Tabasco sauce. Premade or packaged marinades. Premade or packaged taco seasonings. Relishes. Regular salad dressings. Where to find more information:  National Heart, Lung, and [place]: https://wilson-eaton.com/  American Heart Association: www.heart.org Summary  The DASH eating plan is a healthy eating plan that has been shown to reduce high blood pressure (hypertension). It may also reduce your risk for type 2 diabetes, heart disease, and stroke.  With the DASH eating plan, you should limit salt (sodium) intake to 2,300 mg a day. If you have hypertension, you may need to reduce your sodium intake to 1,500 mg a day.  When on the DASH eating plan, aim to eat more fresh fruits and vegetables, whole grains, lean proteins, low-fat dairy, and heart-healthy fats.  Work with your health care provider or diet and nutrition specialist (dietitian) to adjust your eating plan to your individual calorie needs. This information is not intended to replace advice  given to you by your health care provider. Make sure you discuss any questions you have with your health care provider. Document Revised: 01/26/2017 Document Reviewed: 02/07/2016 Elsevier Patient Education  [place].  Alcohol Abuse and Dependence Information, Adult Alcohol is a widely available drug. People drink alcohol in different amounts. People who drink alcohol very often and in large amounts often have problems during and after drinking. They may develop what is called an alcohol use disorder. There are two main types of alcohol use disorders:  Alcohol abuse. This is when you use alcohol too much or too often. You may use alcohol to make yourself feel happy or to reduce stress. You may have a hard time setting a limit on the amount you drink.  Alcohol dependence. This is when you use alcohol consistently for a period of time, and your body changes as a result. This can make it hard to stop drinking because you may start to feel sick or feel different when you do not use alcohol. These symptoms are known as withdrawal. How can alcohol abuse and dependence affect me? Alcohol abuse and dependence can have a negative effect on your life. Drinking too much can lead to  addiction. You may feel like you need alcohol to function normally. You may drink alcohol before work in the morning, during the day, or as soon as you get home from work in the evening. These actions can result in:  Poor work performance.  Job loss.  Financial problems.  Car crashes or criminal charges from driving after drinking alcohol.  Problems in your relationships with friends and family.  Losing the trust and respect of coworkers, friends, and family. Drinking heavily over a long period of time can permanently damage your body and brain, and can cause lifelong health issues, such as:  Damage to your liver or pancreas.  Heart problems, high blood pressure, or stroke.  Certain cancers.  Decreased  ability to fight infections.  Brain or nerve damage.  Depression.  Early (premature) death. If you are careless or you crave alcohol, it is easy to drink more than your body can handle (overdose). Alcohol overdose is a serious situation that requires hospitalization. It may lead to permanent injuries or death. What can increase my risk?  Having a family history of alcohol abuse.  Having depression or other mental health conditions.  Beginning to drink at an early age.  Binge drinking often.  Experiencing trauma, stress, and an unstable home life during childhood.  Spending time with people who drink often. What actions can I take to prevent or manage alcohol abuse and dependence?  Do not drink alcohol if: ? Your health care provider tells you not to drink. ? You are pregnant, may be pregnant, or are planning to become pregnant.  If you drink alcohol: ? Limit how much you use to:  0-1 drink a day for women.  0-2 drinks a day for men. ? Be aware of how much alcohol is in your drink. In the [place] one drink equals one 12 oz bottle of beer (355 mL), one 5 oz glass of wine (148 mL), or one 1 oz glass of hard liquor (44 mL).  Stop drinking if you have been drinking too much. This can be very hard to do if you are used to abusing alcohol. If you begin to have withdrawal symptoms, talk with your health care provider or a person that you trust. These symptoms may include anxiety, shaky hands, headache, nausea, sweating, or not being able to sleep.  Choose to drink nonalcoholic beverages in social gatherings and places where there may be alcohol. Activity  Spend more time on activities that you enjoy that do not involve alcohol, like hobbies or exercise.  Find healthy ways to cope with stress, such as exercise, meditation, or spending time with people you care about. General information  Talk to your family, coworkers, and friends about supporting you in your efforts to stop  drinking. If they drink, ask them not to drink around you. Spend more time with people who do not drink alcohol.  If you think that you have an alcohol dependency problem: ? Tell friends or family about your concerns. ? Talk with your health care provider or another health professional about where to get help. ? Work with a Transport planner and a Regulatory affairs officer. ? Consider joining a support group for people who struggle with alcohol abuse and dependence. Where to find support   Your health care provider.  SMART Recovery: www.smartrecovery.org Therapy and support groups  Local treatment centers or chemical dependency counselors.  Local AA groups in your community: NicTax.com.pt Where to find more information  Centers for Disease Control and Prevention:  http://www.wolf.info/  National Institute on Alcohol Abuse and Alcoholism: http://www.bradshaw.com/  Alcoholics Anonymous (AA): NicTax.com.pt Contact a health care provider if:  You drank more or for longer than you intended on more than one occasion.  You tried to stop drinking or to cut back on how much you drink, but you were not able to.  You often drink to the point of vomiting or passing out.  You want to drink so badly that you cannot think about anything else.  You have problems in your life due to drinking, but you continue to drink.  You keep drinking even though you feel anxious, depressed, or have experienced memory loss.  You have stopped doing the things you used to enjoy in order to drink.  You have to drink more than you used to in order to get the effect you want.  You experience anxiety, sweating, nausea, shakiness, and trouble sleeping when you try to stop drinking. Get help right away if:  You have thoughts about hurting yourself or others.  You have serious withdrawal symptoms, including: ? Confusion. ? Racing heart. ? High blood pressure. ? Fever. If you ever feel like you may hurt yourself or others, or have  thoughts about taking your own life, get help right away. You can go to your nearest emergency department or call:  Your local emergency services (911 in the [place]  A suicide crisis helpline, such as the [facility] at 314-764-1295. This is open 24 hours a day. Summary  Alcohol abuse and dependence can have a negative effect on your life. Drinking too much or too often can lead to addiction.  If you drink alcohol, limit how much you use.  If you are having trouble keeping your drinking under control, find ways to change your behavior. Hobbies, calming activities, exercise, or support groups can help.  If you feel you need help with changing your drinking habits, talk with your health care provider, a good friend, or a therapist, or go to an [place] group. This information is not intended to replace advice given to you by your health care provider. Make sure you discuss any questions you have with your health care provider. Document Revised: 06/04/2018 Document Reviewed: 04/23/2018 Elsevier Patient Education  [place].

## 2019-04-17 ENCOUNTER — Other Ambulatory Visit: Payer: Self-pay | Admitting: Adult Health

## 2019-04-17 DIAGNOSIS — I10 Essential (primary) hypertension: Secondary | ICD-10-CM

## 2019-04-27 DIAGNOSIS — G4733 Obstructive sleep apnea (adult) (pediatric): Secondary | ICD-10-CM | POA: Diagnosis not present

## 2019-04-27 DIAGNOSIS — J449 Chronic obstructive pulmonary disease, unspecified: Secondary | ICD-10-CM | POA: Diagnosis not present

## 2019-05-12 ENCOUNTER — Other Ambulatory Visit: Payer: Self-pay | Admitting: Emergency Medicine

## 2019-05-28 DIAGNOSIS — G4733 Obstructive sleep apnea (adult) (pediatric): Secondary | ICD-10-CM | POA: Diagnosis not present

## 2019-05-28 DIAGNOSIS — J449 Chronic obstructive pulmonary disease, unspecified: Secondary | ICD-10-CM | POA: Diagnosis not present

## 2019-06-13 ENCOUNTER — Other Ambulatory Visit: Payer: Self-pay | Admitting: Emergency Medicine

## 2019-06-14 ENCOUNTER — Other Ambulatory Visit: Payer: Self-pay | Admitting: Emergency Medicine

## 2019-06-17 ENCOUNTER — Other Ambulatory Visit: Payer: Self-pay | Admitting: Adult Health

## 2019-06-17 DIAGNOSIS — I10 Essential (primary) hypertension: Secondary | ICD-10-CM

## 2019-06-23 ENCOUNTER — Other Ambulatory Visit: Payer: Self-pay | Admitting: Adult Health

## 2019-06-23 ENCOUNTER — Other Ambulatory Visit: Payer: Self-pay | Admitting: Emergency Medicine

## 2019-06-23 DIAGNOSIS — I10 Essential (primary) hypertension: Secondary | ICD-10-CM

## 2019-06-24 ENCOUNTER — Telehealth: Payer: Self-pay | Admitting: Emergency Medicine

## 2019-06-24 ENCOUNTER — Other Ambulatory Visit: Payer: Self-pay | Admitting: Emergency Medicine

## 2019-06-24 ENCOUNTER — Telehealth: Payer: Self-pay | Admitting: Adult Health

## 2019-06-24 MED ORDER — BUDESONIDE-FORMOTEROL FUMARATE 160-4.5 MCG/ACT IN AERO: 2.0000 | INHALATION_SPRAY | Freq: Two times a day (BID) | RESPIRATORY_TRACT | 0 refills | Status: DC

## 2019-06-24 NOTE — Telephone Encounter (Signed)
Refill of pt's symbicort and spiriva inhalers have been sent to pharmacy for pt. Called and spoke with pt letting him know this had been done and stated to pt to make sure he arrived for his f/u appt so we could continue to refill meds. Pt verbalized understanding. Nothing further needed.

## 2019-06-24 NOTE — Telephone Encounter (Signed)
Medication:Lisinopril  Pharmacy: [facility] [address] [place]    Pt is out of medication.

## 2019-06-24 NOTE — Telephone Encounter (Signed)
Rx sent to the pharmacy by e-scribe. 

## 2019-06-24 NOTE — Telephone Encounter (Signed)
[place] for 90 days. Needs CPE

## 2019-06-24 NOTE — Telephone Encounter (Signed)
Sent to the pharmacy by e-scribe. 

## 2019-06-27 DIAGNOSIS — J449 Chronic obstructive pulmonary disease, unspecified: Secondary | ICD-10-CM | POA: Diagnosis not present

## 2019-06-27 DIAGNOSIS — G4733 Obstructive sleep apnea (adult) (pediatric): Secondary | ICD-10-CM | POA: Diagnosis not present

## 2019-06-30 ENCOUNTER — Ambulatory Visit: Payer: PPO | Admitting: Adult Health

## 2019-06-30 ENCOUNTER — Other Ambulatory Visit: Payer: Self-pay

## 2019-06-30 ENCOUNTER — Ambulatory Visit (INDEPENDENT_AMBULATORY_CARE_PROVIDER_SITE_OTHER): Payer: PPO

## 2019-06-30 ENCOUNTER — Encounter: Payer: Self-pay | Admitting: Adult Health

## 2019-06-30 VITALS — BP 106/68 | HR 107 | Temp 97.0°F | Ht 73.0 in | Wt 287.2 lb

## 2019-06-30 DIAGNOSIS — J449 Chronic obstructive pulmonary disease, unspecified: Secondary | ICD-10-CM

## 2019-06-30 DIAGNOSIS — G4733 Obstructive sleep apnea (adult) (pediatric): Secondary | ICD-10-CM

## 2019-06-30 DIAGNOSIS — J9691 Respiratory failure, unspecified with hypoxia: Secondary | ICD-10-CM

## 2019-06-30 MED ORDER — BUDESONIDE-FORMOTEROL FUMARATE 160-4.5 MCG/ACT IN AERO: 2.0000 | INHALATION_SPRAY | Freq: Two times a day (BID) | RESPIRATORY_TRACT | 5 refills | Status: DC

## 2019-06-30 MED ORDER — SPIRIVA HANDIHALER 18 MCG IN CAPS: ORAL_CAPSULE | RESPIRATORY_TRACT | 5 refills | Status: DC

## 2019-06-30 MED ORDER — ALBUTEROL SULFATE HFA 108 (90 BASE) MCG/ACT IN AERS: 1.0000 | INHALATION_SPRAY | Freq: Four times a day (QID) | RESPIRATORY_TRACT | 2 refills | Status: DC | PRN

## 2019-06-30 NOTE — Patient Instructions (Addendum)
Continue on Symbicort and Spiriva .  Chest xray today .  Continue on CPAP  At bedtime  .  Work on healthy weight loss. Doing a great job.  Do not drive if sleepy .  Wear Oxygen with activity As needed  , O2 sat goal >90%.  Follow up with Dr. Lamonte Sakai  In 3 months with PFT and As needed

## 2019-06-30 NOTE — Assessment & Plan Note (Signed)
has good control of his obstructive sleep apnea.  Continue on nocturnal CPAP. Encouraged on daily compliance.  Plan  Patient Instructions  Continue on Symbicort and Spiriva .  Chest xray today .  Continue on CPAP  At bedtime  .  Work on healthy weight loss. Doing a great job.  Do not drive if sleepy .  Wear Oxygen with activity As needed  , O2 sat goal >90%.  Follow up with Dr. Lamonte Sakai  In 3 months with PFT and As needed

## 2019-06-30 NOTE — Progress Notes (Signed)
@Patient  ID: Billy [name], male    DOB: 07-05-1970, 49 y.o.   MRN: [id number]  Chief Complaint  Patient presents with  . Follow-up    COPD    Referring provider: Dorothyann Peng, NP  HPI: 49 year old male former smoker (quit 02/2018 ) followed for COPD, obstructive sleep apnea pulmonary hypertension and chronic hypoxemia with associated polycythemia  TEST/EVENTS :  PFT 06/01/10>>FEV1 1.03l/m (23%), good BD response >38% change, DLCO 82%   06/30/2019 Follow up : COPD /OSA Patient presents for a follow-up.  He was last seen July 2020.  Patient has underlying COPD.  He quit smoking in January 2020 was a heavy smoker.  Says he has been doing better since his last visit.  Says he has been working on weight loss and is down 52 pounds.  Says he has been more active.  Does feel that his breathing is improved with decreased shortness of breath and improved activity tolerance.  He says he has not been using his oxygen as much.  Has been checking his oxygen levels at home they have been running 92 to 94% on room air.  He remains on Symbicort and Spiriva.  He denies any flare of cough or wheezing.  He does have underlying sleep apnea.  Is on nocturnal CPAP.  He says he tries to wear it each night.  He has good compliance at 83%.  Daily average usage is 5.5 hours.  Patient is on auto CPAP 5 to 20 cm H2O.  AHI is 1.9.  Patient says he feels rested and not as sleepy in the morning times.   Allergies  Allergen Reactions  . Ativan [Lorazepam] Other (See Comments)    Pt given Ativan.  Pt became unresponsive.    There is no immunization history for the selected administration types on file for this patient.  Past Medical History:  Diagnosis Date  . COPD (chronic obstructive pulmonary disease) ([place])   . History of bronchitis   . Obesity   . Polycythemia    a. Dr. Marin Olp.Marland KitchenMarland KitchenJAK-2 analysis is negative.  b. likely physiologic secondary to chronic hypoxia (? OSA + COPD + obesity).  c. s/p  plebotomy x1  . PVD (peripheral vascular disease) ([place])   . Sleep apnea    a. mod severe by sleep study 9.30.11: AHI 44.8 per hour; oxygen desat to a nadir of 65%; unsuccessful CPAP titration; significant hypoxemia even with supp o2 at 3LPM (dest to 70-80%); needs eval for cardiopulmonary disease and upper airway obstruction  . Tobacco abuse     Tobacco History: Social History   Tobacco Use  Smoking Status Former Smoker  . Packs/day: 0.50  . Years: 25.00  . Pack years: 12.50  . Types: Cigarettes  . Quit date: 03/21/2018  . Years since quitting: 1.2  Smokeless Tobacco Never Used   Counseling given: Not Answered   Outpatient Medications Prior to Visit  Medication Sig Dispense Refill  . aspirin 81 MG tablet Take 2 tablets (161 mg total) by mouth daily. 30 tablet 0  . hydrocortisone 2.5 % cream Apply topically 2 (two) times daily. 30 g 0  . hydrOXYzine (ATARAX/VISTARIL) 25 MG tablet Take 1 tablet (25 mg total) by mouth 3 (three) times daily as needed. 30 tablet 0  . ipratropium-albuterol (DUONEB) 0.5-2.5 (3) MG/3ML SOLN Take 3 mLs by nebulization every 6 (six) hours as needed. 360 mL 0  . lisinopril (ZESTRIL) 20 MG tablet TAKE 1 TABLET BY MOUTH EVERY DAY 90 tablet 0  .  nicotine (NICODERM CQ - DOSED IN MG/24 HOURS) 21 mg/24hr patch Place 21 mg onto the skin daily.    Marland Kitchen albuterol (VENTOLIN HFA) 108 (90 Base) MCG/ACT inhaler INHALE 2 PUFFS INTO THE LUNGS EVERY 6 HOURS AS NEEDED FOR SHORTNESS OF BREATH 18 g 2  . budesonide-formoterol (SYMBICORT) 160-4.5 MCG/ACT inhaler Inhale 2 puffs into the lungs 2 (two) times daily. 10.2 Inhaler 0  . sildenafil (VIAGRA) 25 MG tablet Take 1 tablet (25 mg total) by mouth daily as needed for erectile dysfunction (Take 1-4 tablets as needed). 30 tablet 3  . tiotropium (SPIRIVA HANDIHALER) 18 MCG inhalation capsule PLACE 1 CAPSULE INTO INHALER AND INHALE DAILY. 30 capsule 0   No facility-administered medications prior to visit.     Review of Systems:    Constitutional:   No  weight loss, night sweats,  Fevers, chills,  +fatigue, or  lassitude.  HEENT:   No headaches,  Difficulty swallowing,  Tooth/dental problems, or  Sore throat,                No sneezing, itching, ear ache, nasal congestion, post nasal drip,   CV:  No chest pain,  Orthopnea, PND, swelling in lower extremities, anasarca, dizziness, palpitations, syncope.   GI  No heartburn, indigestion, abdominal pain, nausea, vomiting, diarrhea, change in bowel habits, loss of appetite, bloody stools.   Resp:   No excess mucus, no productive cough,  No non-productive cough,  No coughing up of blood.  No change in color of mucus.  No wheezing.  No chest wall deformity  Skin: no rash or lesions.  GU: no dysuria, change in color of urine, no urgency or frequency.  No flank pain, no hematuria   MS:  No joint pain or swelling.  No decreased range of motion.  No back pain.    Physical Exam  BP 106/68 (BP Location: Left Arm, Cuff Size: Normal)   Pulse (!) 107   Temp (!) 97 F (36.1 C) (Temporal)   Ht 6\' 1"  (1.854 m)   Wt 287 lb 3.2 oz (130.3 kg)   SpO2 95%   BMI 37.89 kg/m   GEN: A/Ox3; pleasant , NAD, BMI 37   HEENT:  [place]/AT,   NOSE-clear, THROAT-clear, no lesions, no postnasal drip or exudate noted.   NECK:  Supple w/ fair ROM; no JVD; normal carotid impulses w/o bruits; no thyromegaly or nodules palpated; no lymphadenopathy.    RESP  Clear  P & A; w/o, wheezes/ rales/ or rhonchi. no accessory muscle use, no dullness to percussion  CARD:  RRR, no m/r/g, tr  peripheral edema, pulses intact, no cyanosis or clubbing.  GI:   Soft & nt; nml bowel sounds; no organomegaly or masses detected.   Musco: Warm bil, no deformities or joint swelling noted.   Neuro: alert, no focal deficits noted.    Skin: Warm, no lesions or rashes    Lab Results:  BNP Imaging: No results found.    No flowsheet data found.  No results found for: NITRICOXIDE      Assessment &  Plan:   COPD (chronic obstructive pulmonary disease) ([place]) Appears to be stable.  Patient does have improved activity tolerance since weight loss and quitting smoking.  Have encouraged him to advance activity as tolerated.  Continue on Symbicort and Spiriva.  He has had no exacerbations over the last year.  We will check chest x-ray today.  At age 51 will need to refer for low-dose CT screening program. Repeat  PFTs on return. Plan  Patient Instructions  Continue on Symbicort and Spiriva .  Chest xray today .  Continue on CPAP  At bedtime  .  Work on healthy weight loss. Doing a great job.  Do not drive if sleepy .  Wear Oxygen with activity As needed  , O2 sat goal >90%.  Follow up with Dr. Lamonte Sakai  In 3 months with PFT and As needed        Obstructive sleep apnea has good control of his obstructive sleep apnea.  Continue on nocturnal CPAP. Encouraged on daily compliance.  Plan  Patient Instructions  Continue on Symbicort and Spiriva .  Chest xray today .  Continue on CPAP  At bedtime  .  Work on healthy weight loss. Doing a great job.  Do not drive if sleepy .  Wear Oxygen with activity As needed  , O2 sat goal >90%.  Follow up with Dr. Lamonte Sakai  In 3 months with PFT and As needed        Respiratory failure with hypoxia ([place]) Continue on oxygen with activity to keep O2 saturation goals at greater than 88 to 90%.     Rexene Edison, NP 06/30/2019

## 2019-06-30 NOTE — Assessment & Plan Note (Signed)
Continue on oxygen with activity to keep O2 saturation goals at greater than 88 to 90%.

## 2019-06-30 NOTE — Assessment & Plan Note (Addendum)
Appears to be stable.  Patient does have improved activity tolerance since weight loss and quitting smoking.  Have encouraged him to advance activity as tolerated.  Continue on Symbicort and Spiriva.  He has had no exacerbations over the last year.  We will check chest x-ray today.  At age 49 will need to refer for low-dose CT screening program. Repeat PFTs on return. Plan  Patient Instructions  Continue on Symbicort and Spiriva .  Chest xray today .  Continue on CPAP  At bedtime  .  Work on healthy weight loss. Doing a great job.  Do not drive if sleepy .  Wear Oxygen with activity As needed  , O2 sat goal >90%.  Follow up with Dr. Lamonte Sakai  In 3 months with PFT and As needed

## 2019-08-04 ENCOUNTER — Other Ambulatory Visit: Payer: Self-pay

## 2019-08-05 ENCOUNTER — Ambulatory Visit (INDEPENDENT_AMBULATORY_CARE_PROVIDER_SITE_OTHER): Payer: PPO | Admitting: Adult Health

## 2019-08-05 ENCOUNTER — Other Ambulatory Visit (INDEPENDENT_AMBULATORY_CARE_PROVIDER_SITE_OTHER): Payer: PPO

## 2019-08-05 ENCOUNTER — Encounter: Payer: Self-pay | Admitting: Adult Health

## 2019-08-05 VITALS — BP 112/76 | Temp 96.5°F | Ht 73.0 in | Wt 287.0 lb

## 2019-08-05 DIAGNOSIS — N529 Male erectile dysfunction, unspecified: Secondary | ICD-10-CM

## 2019-08-05 DIAGNOSIS — J449 Chronic obstructive pulmonary disease, unspecified: Secondary | ICD-10-CM

## 2019-08-05 DIAGNOSIS — Z125 Encounter for screening for malignant neoplasm of prostate: Secondary | ICD-10-CM

## 2019-08-05 DIAGNOSIS — I10 Essential (primary) hypertension: Secondary | ICD-10-CM | POA: Diagnosis not present

## 2019-08-05 DIAGNOSIS — G4733 Obstructive sleep apnea (adult) (pediatric): Secondary | ICD-10-CM | POA: Diagnosis not present

## 2019-08-05 DIAGNOSIS — Z0000 Encounter for general adult medical examination without abnormal findings: Secondary | ICD-10-CM

## 2019-08-05 DIAGNOSIS — Z6841 Body Mass Index (BMI) 40.0 and over, adult: Secondary | ICD-10-CM

## 2019-08-05 DIAGNOSIS — Z1211 Encounter for screening for malignant neoplasm of colon: Secondary | ICD-10-CM | POA: Diagnosis not present

## 2019-08-05 LAB — COMPREHENSIVE METABOLIC PANEL
ALT: 22 U/L (ref 0–53)
AST: 23 U/L (ref 0–37)
Albumin: 4.6 g/dL (ref 3.5–5.2)
Alkaline Phosphatase: 46 U/L (ref 39–117)
BUN: 11 mg/dL (ref 6–23)
CO2: 29 mEq/L (ref 19–32)
Calcium: 9.6 mg/dL (ref 8.4–10.5)
Chloride: 98 mEq/L (ref 96–112)
Creatinine, Ser: 0.97 mg/dL (ref 0.40–1.50)
GFR: 81.94 mL/min (ref >60.00)
Glucose, Bld: 102 mg/dL — ABNORMAL HIGH (ref 70–99)
Potassium: 4.6 mEq/L (ref 3.5–5.1)
Sodium: 135 mEq/L (ref 135–145)
Total Bilirubin: 0.9 mg/dL (ref 0.2–1.2)
Total Protein: 6.9 g/dL (ref 6.0–8.3)

## 2019-08-05 LAB — LIPID PANEL
Cholesterol: 206 mg/dL — ABNORMAL HIGH (ref 0–200)
HDL: 58.7 mg/dL (ref >39.00)
LDL Cholesterol: 130 mg/dL — ABNORMAL HIGH (ref 0–99)
NonHDL: 147.52
Total CHOL/HDL Ratio: 4
Triglycerides: 88 mg/dL (ref 0.0–149.0)
VLDL: 17.6 mg/dL (ref 0.0–40.0)

## 2019-08-05 LAB — PSA: PSA: 0.48 ng/mL (ref 0.10–4.00)

## 2019-08-05 LAB — CBC WITH DIFFERENTIAL/PLATELET
Basophils Absolute: 0.1 10*3/uL (ref 0.0–0.1)
Basophils Relative: 1.2 % (ref 0.0–3.0)
Eosinophils Absolute: 0.1 10*3/uL (ref 0.0–0.7)
Eosinophils Relative: 1.6 % (ref 0.0–5.0)
HCT: 47.9 % (ref 39.0–52.0)
Hemoglobin: 16.5 g/dL (ref 13.0–17.0)
Lymphocytes Relative: 15.4 % (ref 12.0–46.0)
Lymphs Abs: 1.2 10*3/uL (ref 0.7–4.0)
MCHC: 34.4 g/dL (ref 30.0–36.0)
MCV: 104.5 fl — ABNORMAL HIGH (ref 78.0–100.0)
Monocytes Absolute: 0.6 10*3/uL (ref 0.1–1.0)
Monocytes Relative: 7.2 % (ref 3.0–12.0)
Neutro Abs: 5.8 10*3/uL (ref 1.4–7.7)
Neutrophils Relative %: 74.6 % (ref 43.0–77.0)
Platelets: 155 10*3/uL (ref 150.0–400.0)
RBC: 4.58 Mil/uL (ref 4.22–5.81)
RDW: 12.4 % (ref 11.5–15.5)
WBC: 7.8 10*3/uL (ref 4.0–10.5)

## 2019-08-05 LAB — HEMOGLOBIN A1C: Hgb A1c MFr Bld: 5.6 % (ref 4.6–6.5)

## 2019-08-05 LAB — TSH: TSH: 1.96 u[IU]/mL (ref 0.35–4.50)

## 2019-08-05 NOTE — Progress Notes (Signed)
Subjective:    Patient ID: Billy [name], male    DOB: 10-01-1970, 49 y.o.   MRN: [id number]  HPI Patient presents for yearly preventative medicine examination. He is a pleasant 49 year old male who  has a past medical history of COPD (chronic obstructive pulmonary disease) ([place]), History of bronchitis, Obesity, Polycythemia, PVD (peripheral vascular disease) ([place]), Sleep apnea, and Tobacco abuse.  COPD -is followed by pulmonary.  He quit smoking in January 2020.  He remains on Symbicort and Spiriva.  Reports that he has been working on weight loss, becoming more active and does feel as though his breathing is improved with decreased shortness of breath and improved activity tolerance.  OSA -tries to wear his CPAP every night.  He had about 83% compliance on last check.  He feels rested and not sleepy in the morning.  He does not wear his supplemental oxygen routinely  Hypertension -currently prescribed lisinopril 20 mg daily.He denies dizziness, lightheaded, chest pain, os  BP Readings from Last 3 Encounters:  08/05/19 112/76  06/30/19 106/68  01/01/19 (!) 146/80   ED - long term issue. Would like to see a urology for further evaluation. He has tried cialis and viagra in the past but this did not help.   All immunizations and health maintenance protocols were reviewed with the patient and needed orders were placed. He is up to date on routine vaccinations   Appropriate screening laboratory values were ordered for the patient including screening of hyperlipidemia, renal function and hepatic function.  Medication reconciliation,  past medical history, social history, problem list and allergies were reviewed in detail with the patient  Goals were established with regard to weight loss, exercise, and  diet in compliance with medications. He is down 52 pounds. He has cut back on portion size and is doing cardio at home.   Wt Readings from Last 5 Encounters:  08/05/19 287 lb (130.2  kg)  06/30/19 287 lb 3.2 oz (130.3 kg)  01/01/19 (!) 322 lb 12.8 oz (146.4 kg)  09/17/18 (!) 339 lb 3.7 oz (153.9 kg)  07/12/18 (!) 333 lb (151 kg)   He is turning 49 in 2 weeks and will be due for colonoscopy.   Review of Systems  Constitutional: Negative.   HENT: Negative.   Eyes: Negative.   Respiratory: Positive for shortness of breath.   Cardiovascular: Negative.   Gastrointestinal: Negative.   Endocrine: Negative.   Genitourinary: Negative.   Musculoskeletal: Negative.   Skin: Negative.   Allergic/Immunologic: Negative.   Neurological: Negative.   Hematological: Negative.   Psychiatric/Behavioral: Negative.   All other systems reviewed and are negative.  Past Medical History:  Diagnosis Date   COPD (chronic obstructive pulmonary disease) ([facility])    History of bronchitis    Obesity    Polycythemia    a. Dr. Marin Olp.Marland KitchenMarland KitchenJAK-2 analysis is negative.  b. likely physiologic secondary to chronic hypoxia (? OSA + COPD + obesity).  c. s/p plebotomy x1   PVD (peripheral vascular disease) ([facility])    Sleep apnea    a. mod severe by sleep study 9.30.11: AHI 44.8 per hour; oxygen desat to a nadir of 65%; unsuccessful CPAP titration; significant hypoxemia even with supp o2 at 3LPM (dest to 70-80%); needs eval for cardiopulmonary disease and upper airway obstruction   Tobacco abuse     Social History   Socioeconomic History   Marital status: Divorced    Spouse name: Not on file   Number of children: 2  Years of education: Not on file   Highest education level: Not on file  Occupational History   Occupation: unemployed  Tobacco Use   Smoking status: Former Smoker    Packs/day: 0.50    Years: 25.00    Pack years: 12.50    Types: Cigarettes    Quit date: 03/21/2018    Years since quitting: 1.3   Smokeless tobacco: Never Used  Substance and Sexual Activity   Alcohol use: Yes    Alcohol/week: 76.0 standard drinks    Types: 36 Cans of beer, 40 Shots of liquor per  week   Drug use: Yes    Frequency: 1.0 times per week    Types: Marijuana    Comment: marajuana...also valium not prescribed   Sexual activity: Not Currently  Other Topics Concern   Not on file  Social History Narrative   05/01/2018:   Lives with mother on one level   Has two 51yo sons who live nearby   Enjoys hunting/fishing   Since being hospitalized for COPD exacerbation, has quit smoking, and is motivated to do aerobic exercise   Heavy alcohol consumer, does not see it as a problem, no interest in reducing alcohol intake.   Social Determinants of Health   Financial Resource Strain:    Difficulty of Paying Living Expenses:   Food Insecurity:    Worried About Charity fundraiser in the Last Year:    Arboriculturist in the Last Year:   Transportation Needs:    Film/video editor (Medical):    Lack of Transportation (Non-Medical):   Physical Activity:    Days of Exercise per Week:    Minutes of Exercise per Session:   Stress:    Feeling of Stress :   Social Connections:    Frequency of Communication with Friends and Family:    Frequency of Social Gatherings with Friends and Family:    Attends Religious Services:    Active Member of Clubs or Organizations:    Attends Music therapist:    Marital Status:   Intimate Partner Violence:    Fear of Current or Ex-Partner:    Emotionally Abused:    Physically Abused:    Sexually Abused:     Past Surgical History:  Procedure Laterality Date   knee injury  [place]    Family History  Problem Relation Age of Onset   Other Mother        Wegener's Disease   Lung cancer Paternal Grandmother    Drug abuse Father    Alcohol abuse Father    Asthma Father     Allergies  Allergen Reactions   Ativan [Lorazepam] Other (See Comments)    Pt given Ativan.  Pt became unresponsive.    Current Outpatient Medications on File Prior to Visit    Medication Sig Dispense Refill   albuterol (PROAIR HFA) 108 (90 Base) MCG/ACT inhaler Inhale 1-2 puffs into the lungs every 6 (six) hours as needed for wheezing or shortness of breath. 8 g 2   aspirin 81 MG tablet Take 2 tablets (161 mg total) by mouth daily. 30 tablet 0   budesonide-formoterol (SYMBICORT) 160-4.5 MCG/ACT inhaler Inhale 2 puffs into the lungs 2 (two) times daily. 10.2 Inhaler 5   ipratropium-albuterol (DUONEB) 0.5-2.5 (3) MG/3ML SOLN Take 3 mLs by nebulization every 6 (six) hours as needed. 360 mL 0   lisinopril (ZESTRIL) 20 MG tablet TAKE 1 TABLET BY MOUTH  EVERY DAY 90 tablet 0   tiotropium (SPIRIVA HANDIHALER) 18 MCG inhalation capsule PLACE 1 CAPSULE INTO INHALER AND INHALE DAILY. 30 capsule 5   No current facility-administered medications on file prior to visit.    BP 112/76    Temp (!) 96.5 F (35.8 C) (Temporal)    Ht 6\' 1"  (1.854 m)    Wt 287 lb (130.2 kg)    BMI 37.87 kg/m       Objective:   Physical Exam Vitals and nursing note reviewed.  Constitutional:      General: He is not in acute distress.    Appearance: Normal appearance. He is well-developed. He is obese.  HENT:     Head: Normocephalic and atraumatic.     Right Ear: Tympanic membrane, ear canal and external ear normal. There is no impacted cerumen.     Left Ear: Tympanic membrane, ear canal and external ear normal. There is no impacted cerumen.     Nose: Nose normal. No congestion or rhinorrhea.     Mouth/Throat:     Mouth: Mucous membranes are moist.     Pharynx: Oropharynx is clear. No oropharyngeal exudate or posterior oropharyngeal erythema.  Eyes:     General:        Right eye: No discharge.        Left eye: No discharge.     Extraocular Movements: Extraocular movements intact.     Conjunctiva/sclera: Conjunctivae normal.     Pupils: Pupils are equal, round, and reactive to light.  Neck:     Vascular: No carotid bruit.     Trachea: No tracheal deviation.  Cardiovascular:      Rate and Rhythm: Normal rate and regular rhythm.     Pulses: Normal pulses.     Heart sounds: Normal heart sounds. No murmur. No friction rub. No gallop.   Pulmonary:     Effort: Pulmonary effort is normal. No respiratory distress.     Breath sounds: Normal breath sounds. No stridor. No wheezing, rhonchi or rales.  Chest:     Chest wall: No tenderness.  Abdominal:     General: Bowel sounds are normal. There is no distension.     Palpations: Abdomen is soft. There is no mass.     Tenderness: There is no abdominal tenderness. There is no right CVA tenderness, left CVA tenderness, guarding or rebound.     Hernia: No hernia is present.  Musculoskeletal:        General: No swelling, tenderness, deformity or signs of injury. Normal range of motion.     Right lower leg: No edema.     Left lower leg: No edema.  Lymphadenopathy:     Cervical: No cervical adenopathy.  Skin:    General: Skin is warm and dry.     Capillary Refill: Capillary refill takes less than 2 seconds.     Coloration: Skin is not jaundiced or pale.     Findings: No bruising, erythema, lesion or rash.  Neurological:     General: No focal deficit present.     Mental Status: He is alert and oriented to person, place, and time.     Cranial Nerves: No cranial nerve deficit.     Sensory: No sensory deficit.     Motor: No weakness.     Coordination: Coordination normal.     Gait: Gait normal.     Deep Tendon Reflexes: Reflexes normal.  Psychiatric:        Mood and Affect: Mood  normal.        Behavior: Behavior normal.        Thought Content: Thought content normal.        Judgment: Judgment normal.       Assessment & Plan:  1. Routine general medical examination at a health care facility - Doing well with weight loss  - Follow up in one year or sooner if needed - CBC with Differential/Platelet; Future - Comprehensive metabolic panel; Future - Hemoglobin A1c; Future - Lipid panel; Future - TSH; Future  2. Chronic  obstructive pulmonary disease, unspecified COPD type ([place]) - Continue with inhalers.  - Follow up with pulmonary as directed  3. Obstructive sleep apnea - Continue with CPAP  - Follow up with pulmonary as directed  4. Essential hypertension - Well controlled. No change in medications  - CBC with Differential/Platelet; Future - Comprehensive metabolic panel; Future - Hemoglobin A1c; Future - Lipid panel; Future - TSH; Future  5. Class 3 severe obesity due to excess calories with serious comorbidity and body mass index (BMI) of 40.0 to 44.9 in adult [facility]) - Congratulated on weight loss. Continue to work on lifestyle modifications  - CBC with Differential/Platelet; Future - Comprehensive metabolic panel; Future - Hemoglobin A1c; Future - Lipid panel; Future - TSH; Future  6. Prostate cancer screening  - PSA; Future  7. Erectile dysfunction, unspecified erectile dysfunction type  - Ambulatory referral to Urology  8. Colon cancer screening  - Ambulatory referral to Gastroenterology  Dorothyann Peng, NP

## 2019-08-05 NOTE — Patient Instructions (Signed)
It was great seeing you. Continue to work on weight loss through diet and exercise   Urology and Gastroenterology will call you to schedule your appointments   Please follow up in one year or sooner if needed

## 2019-08-06 ENCOUNTER — Encounter: Payer: Self-pay | Admitting: Gastroenterology

## 2019-09-11 ENCOUNTER — Other Ambulatory Visit: Payer: Self-pay | Admitting: Adult Health

## 2019-09-11 DIAGNOSIS — I10 Essential (primary) hypertension: Secondary | ICD-10-CM

## 2019-09-11 NOTE — Telephone Encounter (Signed)
SENT TO THE PHARMACY BY E-SCRIBE. 

## 2019-09-12 DIAGNOSIS — N5201 Erectile dysfunction due to arterial insufficiency: Secondary | ICD-10-CM | POA: Diagnosis not present

## 2019-09-24 DIAGNOSIS — G4733 Obstructive sleep apnea (adult) (pediatric): Secondary | ICD-10-CM | POA: Diagnosis not present

## 2019-09-25 ENCOUNTER — Ambulatory Visit (AMBULATORY_SURGERY_CENTER): Payer: Self-pay | Admitting: *Deleted

## 2019-09-25 ENCOUNTER — Encounter: Payer: Self-pay | Admitting: Gastroenterology

## 2019-09-25 ENCOUNTER — Other Ambulatory Visit: Payer: Self-pay

## 2019-09-25 VITALS — Ht 73.0 in | Wt 284.0 lb

## 2019-09-25 DIAGNOSIS — Z1211 Encounter for screening for malignant neoplasm of colon: Secondary | ICD-10-CM

## 2019-09-25 DIAGNOSIS — Z01818 Encounter for other preprocedural examination: Secondary | ICD-10-CM

## 2019-09-25 NOTE — Progress Notes (Signed)
No egg or soy allergy known to patient  No issues with past sedation with any surgeries or procedures no intubation problems in the past  No diet pills per patient No home 02 use per patient  No blood thinners per patient  Pt denies issues with constipation  No A fib or A flutter  EMMI video to pt or MyChart  COVID 19 guidelines implemented in [facility] today   8-10 10 am covid test   Due to the COVID-19 pandemic we are asking patients to follow these guidelines. Please only bring one care partner. Please be aware that your care partner may wait in the car in the parking lot or if they feel like they will be too hot to wait in the car, they may wait in the lobby on the 4th floor. All care partners are required to wear a mask the entire time (we do not have any that we can provide them), they need to practice social distancing, and we will do a Covid check for all patient's and care partners when you arrive. Also we will check their temperature and your temperature. If the care partner waits in their car they need to stay in the parking lot the entire time and we will call them on their cell phone when the patient is ready for discharge so they can bring the car to the front of the building. Also all patient's will need to wear a mask into building.

## 2019-09-27 ENCOUNTER — Other Ambulatory Visit (HOSPITAL_COMMUNITY): Payer: PPO

## 2019-10-07 ENCOUNTER — Other Ambulatory Visit: Payer: Self-pay | Admitting: Gastroenterology

## 2019-10-07 ENCOUNTER — Ambulatory Visit (INDEPENDENT_AMBULATORY_CARE_PROVIDER_SITE_OTHER): Payer: PPO

## 2019-10-07 DIAGNOSIS — Z1159 Encounter for screening for other viral diseases: Secondary | ICD-10-CM | POA: Diagnosis not present

## 2019-10-07 LAB — SARS CORONAVIRUS 2 (TAT 6-24 HRS): SARS Coronavirus 2: NEGATIVE

## 2019-10-09 ENCOUNTER — Other Ambulatory Visit: Payer: Self-pay

## 2019-10-09 ENCOUNTER — Ambulatory Visit (AMBULATORY_SURGERY_CENTER): Payer: PPO | Admitting: Gastroenterology

## 2019-10-09 ENCOUNTER — Encounter: Payer: Self-pay | Admitting: Gastroenterology

## 2019-10-09 VITALS — BP 118/49 | HR 72 | Temp 97.0°F | Resp 16 | Ht 73.0 in | Wt 284.0 lb

## 2019-10-09 DIAGNOSIS — Z1211 Encounter for screening for malignant neoplasm of colon: Secondary | ICD-10-CM

## 2019-10-09 MED ORDER — SODIUM CHLORIDE 0.9 % IV SOLN: 500.0000 mL | Freq: Once | INTRAVENOUS | Status: DC

## 2019-10-09 NOTE — Progress Notes (Signed)
PT taken to PACU. Monitors in place. VSS. Report given to RN. 

## 2019-10-09 NOTE — Patient Instructions (Signed)
Your colonoscopy was normal, you don't need another one for 10 years!  YOU HAD AN ENDOSCOPIC PROCEDURE TODAY AT [facility]:   Refer to the procedure report that was given to you for any specific questions about what was found during the examination.  If the procedure report does not answer your questions, please call your gastroenterologist to clarify.  If you requested that your care partner not be given the details of your procedure findings, then the procedure report has been included in a sealed envelope for you to review at your convenience later.  YOU SHOULD EXPECT: Some feelings of bloating in the abdomen. Passage of more gas than usual.  Walking can help get rid of the air that was put into your GI tract during the procedure and reduce the bloating. If you had a lower endoscopy (such as a colonoscopy or flexible sigmoidoscopy) you may notice spotting of blood in your stool or on the toilet paper. If you underwent a bowel prep for your procedure, you may not have a normal bowel movement for a few days.  Please Note:  You might notice some irritation and congestion in your nose or some drainage.  This is from the oxygen used during your procedure.  There is no need for concern and it should clear up in a day or so.  SYMPTOMS TO REPORT IMMEDIATELY:   Following lower endoscopy (colonoscopy or flexible sigmoidoscopy):  Excessive amounts of blood in the stool  Significant tenderness or worsening of abdominal pains  Swelling of the abdomen that is new, acute  Fever of 100F or higher  For urgent or emergent issues, a gastroenterologist can be reached at any hour by calling [phone]. Do not use MyChart messaging for urgent concerns.    DIET:  We do recommend a small meal at first, but then you may proceed to your regular diet.  Drink plenty of fluids but you should avoid alcoholic beverages for 24 hours.  ACTIVITY:  You should plan to take it easy for the rest of today  and you should NOT DRIVE or use heavy machinery until tomorrow (because of the sedation medicines used during the test).    FOLLOW UP: Our staff will call the number listed on your records 48-72 hours following your procedure to check on you and address any questions or concerns that you may have regarding the information given to you following your procedure. If we do not reach you, we will leave a message.  We will attempt to reach you two times.  During this call, we will ask if you have developed any symptoms of COVID 19. If you develop any symptoms (ie: fever, flu-like symptoms, shortness of breath, cough etc.) before then, please call [phone].  If you test positive for Covid 19 in the 2 weeks post procedure, please call and report this information to [place].    If any biopsies were taken you will be contacted by phone or by letter within the next 1-3 weeks.  Please call us at [phone] if you have not heard about the biopsies in 3 weeks.    SIGNATURES/CONFIDENTIALITY: You and/or your care partner have signed paperwork which will be entered into your electronic medical record.  These signatures attest to the fact that that the information above on your After Visit Summary has been reviewed and is understood.  Full responsibility of the confidentiality of this discharge information lies with you and/or your care-partner.

## 2019-10-09 NOTE — Op Note (Signed)
[place] Patient Name: Billy [name] Procedure Date: 10/09/2019 9:46 AM MRN: [id number] Endoscopist: Mallie Mussel L. Loletha Carrow , MD Age: 49 Referring MD:  Date of Birth: [birth year] Gender: Male Account #: [id number] Procedure:                Colonoscopy Indications:              Screening for colorectal malignant neoplasm, This                            is the patient's first colonoscopy Medicines:                Monitored Anesthesia Care Procedure:                Pre-Anesthesia Assessment:                           - Prior to the procedure, a History and Physical                            was performed, and patient medications and                            allergies were reviewed. The patient's tolerance of                            previous anesthesia was also reviewed. The risks                            and benefits of the procedure and the sedation                            options and risks were discussed with the patient.                            All questions were answered, and informed consent                            was obtained. Prior Anticoagulants: The patient has                            taken no previous anticoagulant or antiplatelet                            agents except for aspirin. ASA Grade Assessment:                            III - A patient with severe systemic disease. After                            reviewing the risks and benefits, the patient was                            deemed in satisfactory condition to undergo the  procedure.                           After obtaining informed consent, the colonoscope                            was passed under direct vision. Throughout the                            procedure, the patient's blood pressure, pulse, and                            oxygen saturations were monitored continuously. The                            Colonoscope was introduced through the anus and                             advanced to the the cecum, identified by                            appendiceal orifice and ileocecal valve. The                            colonoscopy was performed with difficulty due to a                            redundant colon, significant looping and the                            patient's body habitus. Successful completion of                            the procedure was aided by using manual pressure.                            The patient tolerated the procedure well. The                            quality of the bowel preparation was good. The                            ileocecal valve, appendiceal orifice, and rectum                            were photographed. Scope In: 9:58:36 AM Scope Out: 10:22:34 AM Scope Withdrawal Time: 0 hours 11 minutes 47 seconds  Total Procedure Duration: 0 hours 23 minutes 58 seconds  Findings:                 The perianal and digital rectal examinations were                            normal.  The colon (entire examined portion) was markedly                            redundant.                           The exam was otherwise without abnormality on                            direct and retroflexion views. Complications:            No immediate complications. Estimated Blood Loss:     Estimated blood loss: none. Impression:               - Redundant colon.                           - The examination was otherwise normal on direct                            and retroflexion views.                           - No specimens collected. Recommendation:           - Patient has a contact number available for                            emergencies. The signs and symptoms of potential                            delayed complications were discussed with the                            patient. Return to normal activities tomorrow.                            Written discharge instructions were provided to the                             patient.                           - Resume previous diet.                           - Continue present medications.                           - Repeat colonoscopy in 10 years for screening                            purposes. Billy [name] L. Loletha Carrow, MD 10/09/2019 10:29:54 AM This report has been signed electronically.

## 2019-10-09 NOTE — Progress Notes (Signed)
Vital signs checked by:BC  The patient states no changes in medical or surgical history since pre-visit screening on 09/25/19.

## 2019-10-11 ENCOUNTER — Other Ambulatory Visit (HOSPITAL_COMMUNITY): Payer: PPO

## 2019-10-13 ENCOUNTER — Telehealth: Payer: Self-pay | Admitting: *Deleted

## 2019-10-13 NOTE — Telephone Encounter (Signed)
  Follow up Call-  Call back number 10/09/2019  Post procedure Call Back phone  # [phone]  Permission to leave phone message Yes  Some recent data might be hidden     Patient questions:  Do you have a fever, pain , or abdominal swelling? No. Pain Score  0 *  Have you tolerated food without any problems? Yes.    Have you been able to return to your normal activities? Yes.    Do you have any questions about your discharge instructions: Diet   No. Medications  No. Follow up visit  No.  Do you have questions or concerns about your Care? No.  Actions: * If pain score is 4 or above: No action needed, pain <4.  1. Have you developed a fever since your procedure? no  2.   Have you had an respiratory symptoms (SOB or cough) since your procedure? no  3.   Have you tested positive for COVID 19 since your procedure no  4.   Have you had any family members/close contacts diagnosed with the COVID 19 since your procedure?  no   If yes to any of these questions please route to Joylene John, RN and Joella Prince, RN

## 2019-10-14 ENCOUNTER — Ambulatory Visit: Payer: PPO | Admitting: Emergency Medicine

## 2019-10-28 DIAGNOSIS — G4733 Obstructive sleep apnea (adult) (pediatric): Secondary | ICD-10-CM | POA: Diagnosis not present

## 2019-10-28 DIAGNOSIS — J449 Chronic obstructive pulmonary disease, unspecified: Secondary | ICD-10-CM | POA: Diagnosis not present

## 2019-11-05 ENCOUNTER — Other Ambulatory Visit: Payer: Self-pay

## 2019-11-07 ENCOUNTER — Ambulatory Visit (INDEPENDENT_AMBULATORY_CARE_PROVIDER_SITE_OTHER): Payer: PPO

## 2019-11-07 ENCOUNTER — Other Ambulatory Visit: Payer: Self-pay

## 2019-11-07 DIAGNOSIS — Z0000 Encounter for general adult medical examination without abnormal findings: Secondary | ICD-10-CM | POA: Diagnosis not present

## 2019-11-07 NOTE — Patient Instructions (Addendum)
Billy [name] , Thank you for taking time to come for your Medicare Wellness Visit. I appreciate your ongoing commitment to your health goals. Please review the following plan we discussed and let me know if I can assist you in the future.   Screening recommendations/referrals: Colonoscopy: Up to date, next due 10/08/2029 Recommended yearly ophthalmology/optometry visit for glaucoma screening and checkup Recommended yearly dental visit for hygiene and checkup  Vaccinations: Influenza vaccine: Patient declined Pneumococcal vaccine: Patient declined Tdap vaccine: Patient declined  Shingles vaccine: Patient declined     Advanced directives: Advance directive discussed with you today. Even though you declined this today please call our office should you change your mind and we can give you the proper paperwork for you to fill out.   Conditions/risks identified: Please try reducing your alcohol consumption  Next appointment: 04/15/2020 @ 9:30 am via telephone with Pharmacist  Preventive Care 40-64 Years, Male Preventive care refers to lifestyle choices and visits with your health care provider that can promote health and wellness. What does preventive care include?  A yearly physical exam. This is also called an annual well check.  Dental exams once or twice a year.  Routine eye exams. Ask your health care provider how often you should have your eyes checked.  Personal lifestyle choices, including:  Daily care of your teeth and gums.  Regular physical activity.  Eating a healthy diet.  Avoiding tobacco and drug use.  Limiting alcohol use.  Practicing safe sex.  Taking low-dose aspirin every day starting at age 34. What happens during an annual well check? The services and screenings done by your health care provider during your annual well check will depend on your age, overall health, lifestyle risk factors, and family history of disease. Counseling  Your health care  provider may ask you questions about your:  Alcohol use.  Tobacco use.  Drug use.  Emotional well-being.  Home and relationship well-being.  Sexual activity.  Eating habits.  Work and work Statistician. Screening  You may have the following tests or measurements:  Height, weight, and BMI.  Blood pressure.  Lipid and cholesterol levels. These may be checked every 5 years, or more frequently if you are over 71 years old.  Skin check.  Lung cancer screening. You may have this screening every year starting at age 47 if you have a 30-pack-year history of smoking and currently smoke or have quit within the past 15 years.  Fecal occult blood test (FOBT) of the stool. You may have this test every year starting at age 75.  Flexible sigmoidoscopy or colonoscopy. You may have a sigmoidoscopy every 5 years or a colonoscopy every 10 years starting at age 80.  Prostate cancer screening. Recommendations will vary depending on your family history and other risks.  Hepatitis C blood test.  Hepatitis B blood test.  Sexually transmitted disease (STD) testing.  Diabetes screening. This is done by checking your blood sugar (glucose) after you have not eaten for a while (fasting). You may have this done every 1-3 years. Discuss your test results, treatment options, and if necessary, the need for more tests with your health care provider. Vaccines  Your health care provider may recommend certain vaccines, such as:  Influenza vaccine. This is recommended every year.  Tetanus, diphtheria, and acellular pertussis (Tdap, Td) vaccine. You may need a Td booster every 10 years.  Zoster vaccine. You may need this after age 73.  Pneumococcal 13-valent conjugate (PCV13) vaccine. You may need this  if you have certain conditions and have not been vaccinated.  Pneumococcal polysaccharide (PPSV23) vaccine. You may need one or two doses if you smoke cigarettes or if you have certain conditions. Talk  to your health care provider about which screenings and vaccines you need and how often you need them. This information is not intended to replace advice given to you by your health care provider. Make sure you discuss any questions you have with your health care provider. Document Released: 03/12/2015 Document Revised: 11/03/2015 Document Reviewed: 12/15/2014 Elsevier Interactive Patient Education  2017 [place] Prevention in the Home Falls can cause injuries. They can happen to people of all ages. There are many things you can do to make your home safe and to help prevent falls. What can I do on the outside of my home?  Regularly fix the edges of walkways and driveways and fix any cracks.  Remove anything that might make you trip as you walk through a door, such as a raised step or threshold.  Trim any bushes or trees on the path to your home.  Use bright outdoor lighting.  Clear any walking paths of anything that might make someone trip, such as rocks or tools.  Regularly check to see if handrails are loose or broken. Make sure that both sides of any steps have handrails.  Any raised decks and porches should have guardrails on the edges.  Have any leaves, snow, or ice cleared regularly.  Use sand or salt on walking paths during winter.  Clean up any spills in your garage right away. This includes oil or grease spills. What can I do in the bathroom?  Use night lights.  Install grab bars by the toilet and in the tub and shower. Do not use towel bars as grab bars.  Use non-skid mats or decals in the tub or shower.  If you need to sit down in the shower, use a plastic, non-slip stool.  Keep the floor dry. Clean up any water that spills on the floor as soon as it happens.  Remove soap buildup in the tub or shower regularly.  Attach bath mats securely with double-sided non-slip rug tape.  Do not have throw rugs and other things on the floor that can make you  trip. What can I do in the bedroom?  Use night lights.  Make sure that you have a light by your bed that is easy to reach.  Do not use any sheets or blankets that are too big for your bed. They should not hang down onto the floor.  Have a firm chair that has side arms. You can use this for support while you get dressed.  Do not have throw rugs and other things on the floor that can make you trip. What can I do in the kitchen?  Clean up any spills right away.  Avoid walking on wet floors.  Keep items that you use a lot in easy-to-reach places.  If you need to reach something above you, use a strong step stool that has a grab bar.  Keep electrical cords out of the way.  Do not use floor polish or wax that makes floors slippery. If you must use wax, use non-skid floor wax.  Do not have throw rugs and other things on the floor that can make you trip. What can I do with my stairs?  Do not leave any items on the stairs.  Make sure that there are handrails on  both sides of the stairs and use them. Fix handrails that are broken or loose. Make sure that handrails are as long as the stairways.  Check any carpeting to make sure that it is firmly attached to the stairs. Fix any carpet that is loose or worn.  Avoid having throw rugs at the top or bottom of the stairs. If you do have throw rugs, attach them to the floor with carpet tape.  Make sure that you have a light switch at the top of the stairs and the bottom of the stairs. If you do not have them, ask someone to add them for you. What else can I do to help prevent falls?  Wear shoes that:  Do not have high heels.  Have rubber bottoms.  Are comfortable and fit you well.  Are closed at the toe. Do not wear sandals.  If you use a stepladder:  Make sure that it is fully opened. Do not climb a closed stepladder.  Make sure that both sides of the stepladder are locked into place.  Ask someone to hold it for you, if  possible.  Clearly [place] and make sure that you can see:  Any grab bars or handrails.  First and last steps.  Where the edge of each step is.  Use tools that help you move around (mobility aids) if they are needed. These include:  Canes.  Walkers.  Scooters.  Crutches.  Turn on the lights when you go into a dark area. Replace any light bulbs as soon as they burn out.  Set up your furniture so you have a clear path. Avoid moving your furniture around.  If any of your floors are uneven, fix them.  If there are any pets around you, be aware of where they are.  Review your medicines with your doctor. Some medicines can make you feel dizzy. This can increase your chance of falling. Ask your doctor what other things that you can do to help prevent falls. This information is not intended to replace advice given to you by your health care provider. Make sure you discuss any questions you have with your health care provider. Document Released: 12/10/2008 Document Revised: 07/22/2015 Document Reviewed: 03/20/2014 Elsevier Interactive Patient Education  2017 [employer].

## 2019-11-07 NOTE — Progress Notes (Signed)
Subjective:   Billy [name] is a 49 y.o. male who presents for Medicare Annual/Subsequent preventive examination.  I connected with Larita Fife today by telephone and verified that I am speaking with the correct person using two identifiers. Location patient: home Location provider: work Persons participating in the virtual visit: patient, provider.   I discussed the limitations, risks, security and privacy concerns of performing an evaluation and management service by telephone and the availability of in person appointments. I also discussed with the patient that there may be a patient responsible charge related to this service. The patient expressed understanding and verbally consented to this telephonic visit.    Interactive audio and video telecommunications were attempted between this provider and patient, however failed, due to patient having technical difficulties OR patient did not have access to video capability.  We continued and completed visit with audio only.      Review of Systems    N/A Cardiac Risk Factors include: male gender     Objective:    Today's Vitals   There is no height or weight on file to calculate BMI.  Advanced Directives 11/07/2019 05/01/2018 04/29/2018 03/22/2018 03/22/2018 12/16/2014 06/08/2014  Does Patient Have a Medical Advance Directive? No No No No No No No  Does patient want to make changes to medical advance directive? No - Patient declined - - - - - -  Would patient like information on creating a medical advance directive? - No - Patient declined No - Patient declined No - Patient declined - No - patient declined information No - patient declined information    Current Medications (verified) Outpatient Encounter Medications as of 11/07/2019  Medication Sig  . albuterol (PROAIR HFA) 108 (90 Base) MCG/ACT inhaler Inhale 1-2 puffs into the lungs every 6 (six) hours as needed for wheezing or shortness of breath.  Marland Kitchen aspirin 81 MG tablet  Take 2 tablets (161 mg total) by mouth daily.  . budesonide-formoterol (SYMBICORT) 160-4.5 MCG/ACT inhaler Inhale 2 puffs into the lungs 2 (two) times daily.  Marland Kitchen lisinopril (ZESTRIL) 20 MG tablet TAKE 1 TABLET BY MOUTH EVERY DAY  . tiotropium (SPIRIVA HANDIHALER) 18 MCG inhalation capsule PLACE 1 CAPSULE INTO INHALER AND INHALE DAILY.   No facility-administered encounter medications on file as of 11/07/2019.    Allergies (verified) Ativan [lorazepam]   History: Past Medical History:  Diagnosis Date  . Allergy    as a child- none as adult   . COPD (chronic obstructive pulmonary disease) ([place])   . History of bronchitis   . Hypertension   . Obesity   . Oxygen deficiency    Pt uses 02  3 liters with his CPAP ONLY   . PFO (patent foramen ovale)   . Polycythemia    a. Dr. Marin Olp.Marland KitchenMarland KitchenJAK-2 analysis is negative.  b. likely physiologic secondary to chronic hypoxia (? OSA + COPD + obesity).  c. s/p plebotomy x1  . PVD (peripheral vascular disease) ([place])   . Sleep apnea    a. mod severe by sleep study 9.30.11: AHI 44.8 per hour; oxygen desat to a nadir of 65%; unsuccessful CPAP titration; significant hypoxemia even with supp o2 at 3LPM (dest to 70-80%); needs eval for cardiopulmonary disease and upper airway obstruction  . Substance abuse ([place])    alcoholism   . Tobacco abuse    Past Surgical History:  Procedure Laterality Date  . knee injury  1982  . TONSILLECTOMY  1972  . TYMPANOSTOMY TUBE PLACEMENT  as child    Family History  Problem Relation Age of Onset  . Other Mother        Wegener's Disease  . Lung cancer Paternal Grandmother   . Drug abuse Father   . Alcohol abuse Father   . Asthma Father   . Colon cancer Neg Hx   . Colon polyps Neg Hx   . Esophageal cancer Neg Hx   . Rectal cancer Neg Hx   . Stomach cancer Neg Hx    Social History   Socioeconomic History  . Marital status: Divorced    Spouse name: Not on file  . Number of children: 2  . Years of education:  Not on file  . Highest education level: Not on file  Occupational History  . Occupation: unemployed  Tobacco Use  . Smoking status: Former Smoker    Packs/day: 0.50    Years: 25.00    Pack years: 12.50    Types: Cigarettes    Quit date: 03/21/2018    Years since quitting: 1.6  . Smokeless tobacco: Never Used  Vaping Use  . Vaping Use: Former  Substance and Sexual Activity  . Alcohol use: Yes    Alcohol/week: 76.0 standard drinks    Types: 36 Cans of beer, 40 Shots of liquor per week  . Drug use: Yes    Frequency: 1.0 times per week    Types: Marijuana    Comment: marajuana...also valium not prescribed  . Sexual activity: Not Currently  Other Topics Concern  . Not on file  Social History Narrative   05/01/2018:   Lives with mother on one level   Has two 63yo sons who live nearby   Enjoys hunting/fishing   Since being hospitalized for COPD exacerbation, has quit smoking, and is motivated to do aerobic exercise   Heavy alcohol consumer, does not see it as a problem, no interest in reducing alcohol intake.   Social Determinants of Health   Financial Resource Strain: Low Risk   . Difficulty of Paying Living Expenses: Not hard at all  Food Insecurity: No Food Insecurity  . Worried About Charity fundraiser in the Last Year: Never true  . Ran Out of Food in the Last Year: Never true  Transportation Needs: No Transportation Needs  . Lack of Transportation (Medical): No  . Lack of Transportation (Non-Medical): No  Physical Activity: Inactive  . Days of Exercise per Week: 0 days  . Minutes of Exercise per Session: 0 min  Stress: No Stress Concern Present  . Feeling of Stress : Not at all  Social Connections: Moderately Isolated  . Frequency of Communication with Friends and Family: More than three times a week  . Frequency of Social Gatherings with Friends and Family: More than three times a week  . Attends Religious Services: 1 to 4 times per year  . Active Member of Clubs or  Organizations: No  . Attends Archivist Meetings: Never  . Marital Status: Divorced    Tobacco Counseling Counseling given: Not Answered   Clinical Intake:  Pre-visit preparation completed: Yes  Pain : No/denies pain     Nutritional Risks: None Diabetes: No  How often do you need to have someone help you when you read instructions, pamphlets, or other written materials from your doctor or pharmacy?: 1 - Never What is the last grade level you completed in school?: Some College  Diabetic?No  Interpreter Needed?: No  Information entered by :: Margaret   Activities  of Daily Living In your present state of health, do you have any difficulty performing the following activities: 11/07/2019  Hearing? N  Vision? N  Difficulty concentrating or making decisions? N  Walking or climbing stairs? N  Dressing or bathing? N  Doing errands, shopping? N  Preparing Food and eating ? N  Using the Toilet? N  In the past six months, have you accidently leaked urine? N  Do you have problems with loss of bowel control? N  Managing your Medications? N  Managing your Finances? N  Housekeeping or managing your Housekeeping? N  Some recent data might be hidden    Patient Care Team: Dorothyann Peng, NP as PCP - General (Family Medicine) Collene Gobble, MD as Consulting Physician (Pulmonary Disease) Earnie Larsson, [facility] as Pharmacist (Pharmacist)  Indicate any recent Medical Services you may have received from other than [facility] providers in the past year (date may be approximate).     Assessment:   This is a routine wellness examination for [employer].  Hearing/Vision screen  Hearing Screening   125Hz  250Hz  500Hz  1000Hz  2000Hz  3000Hz  4000Hz  6000Hz  8000Hz   Right ear:           Left ear:           Vision Screening Comments: Patient states does not have an eye doctor   Dietary issues and exercise activities discussed: Current Exercise Habits: The patient does not participate  in regular exercise at present, Exercise limited by: respiratory conditions(s)  Goals    . Patient Stated     Improve breathing, do pulmonary rehab and start swimming/going to [employer]    . Pharmacy Care Plan     Current Barriers:  . Chronic Disease Management support, education, and care coordination needs related to HTN, COPD, and History of tobacco use, Alcohol use, Obstructive sleep apnea, Polycythemia   Pharmacist Clinical Goal(s):  Marland Kitchen Maintain Blood pressure <130/80 mmHg  . Continue working on lifestyle modifications (diet/exercise). . Stay active, engage in at least 150 minutes per week of moderate-intensity exercise such as brisk walking (15- to 20-minute mile) or something similar.  . Continue with diet modifications (cutting back in sweets, portion control).  .  Interventions: . Comprehensive medication review performed. . Discussed diet and exercise modifications and effect on blood pressure.  . Discussed lifestyle modifications. (include nuts, legumes, vegetables, fruits in diet; aim for 30 minutes three times a week of physical activity).  Patient Self Care Activities:  . Calls provider office for new concerns or questions . Continue current medications as directed by providers.  . Continue at home blood pressure readings.  Initial goal documentation       Depression Screen PHQ 2/9 Scores 11/07/2019 05/01/2018 04/29/2018 06/09/2013 03/10/2013  PHQ - 2 Score 0 0 0 0 0  PHQ- 9 Score 0 3 3 - -    Fall Risk Fall Risk  11/07/2019 05/01/2018 04/29/2018 12/16/2014 06/08/2014  Falls in the past year? 0 0 0 No No  Number falls in past yr: 0 - - - -  Injury with Fall? 0 - - - -  Risk for fall due to : No Fall Risks - - - -  Follow up Falls evaluation completed;Falls prevention discussed - - - -    Any stairs in or around the home? Yes  If so, are there any without handrails? No  Home free of loose throw rugs in walkways, pet beds, electrical cords, etc? Yes  Adequate  lighting in your home to  reduce risk of falls? Yes   ASSISTIVE DEVICES UTILIZED TO PREVENT FALLS:  Life alert? No  Use of a cane, walker or w/c? No  Grab bars in the bathroom? No  Shower chair or bench in shower? No  Elevated toilet seat or a handicapped toilet? No     Cognitive Function:  Cognitive screening not indicated based on direct observation      Immunizations There is no immunization history for the selected administration types on file for this patient.  TDAP status: Due, Education has been provided regarding the importance of this vaccine. Advised may receive this vaccine at local pharmacy or Health Dept. Aware to provide a copy of the vaccination record if obtained from local pharmacy or Health Dept. Verbalized acceptance and understanding. Flu Vaccine status: Declined, Education has been provided regarding the importance of this vaccine but patient still declined. Advised may receive this vaccine at local pharmacy or Health Dept. Aware to provide a copy of the vaccination record if obtained from local pharmacy or Health Dept. Verbalized acceptance and understanding. Pneumococcal vaccine status: Declined,  Education has been provided regarding the importance of this vaccine but patient still declined. Advised may receive this vaccine at local pharmacy or Health Dept. Aware to provide a copy of the vaccination record if obtained from local pharmacy or Health Dept. Verbalized acceptance and understanding.  Covid-19 vaccine status: Declined, Education has been provided regarding the importance of this vaccine but patient still declined. Advised may receive this vaccine at local pharmacy or Health Dept.or vaccine clinic. Aware to provide a copy of the vaccination record if obtained from local pharmacy or Health Dept. Verbalized acceptance and understanding.  Qualifies for Shingles Vaccine? Yes   Zostavax completed No   Shingrix Completed?: No.    Education has been provided  regarding the importance of this vaccine. Patient has been advised to call insurance company to determine out of pocket expense if they have not yet received this vaccine. Advised may also receive vaccine at local pharmacy or Health Dept. Verbalized acceptance and understanding.  Screening Tests Health Maintenance  Topic Date Due  . COVID-19 Vaccine (1) Never done  . TETANUS/TDAP  08/04/2020 (Originally 08/16/1989)  . COLONOSCOPY  10/08/2029  . Hepatitis C Screening  Completed  . HIV Screening  Completed    Health Maintenance  Health Maintenance Due  Topic Date Due  . COVID-19 Vaccine (1) Never done    Colorectal cancer screening: Completed 10/09/2019. Repeat every 10 years  Lung Cancer Screening: (Low Dose CT Chest recommended if Age 22-80 years, 30 pack-year currently smoking OR have quit w/in 15years.) does not qualify.   Lung Cancer Screening Referral: N/A  Additional Screening:  Hepatitis C Screening: does qualify; Completed 08/20/2017  Vision Screening: Recommended annual ophthalmology exams for early detection of glaucoma and other disorders of the eye. Is the patient up to date with their annual eye exam?  No  Who is the provider or what is the name of the office in which the patient attends annual eye exams? Does not have one If pt is not established with a provider, would they like to be referred to a provider to establish care? No .   Dental Screening: Recommended annual dental exams for proper oral hygiene  Community Resource Referral / Chronic Care Management: CRR required this visit?  No   CCM required this visit?  No      Plan:     I have personally reviewed and noted the following in  the patient's chart:   . Medical and social history . Use of alcohol, tobacco or illicit drugs  . Current medications and supplements . Functional ability and status . Nutritional status . Physical activity . Advanced directives . List of other  physicians . Hospitalizations, surgeries, and ER visits in previous 12 months . Vitals . Screenings to include cognitive, depression, and falls . Referrals and appointments  In addition, I have reviewed and discussed with patient certain preventive protocols, quality metrics, and best practice recommendations. A written personalized care plan for preventive services as well as general preventive health recommendations were provided to patient.     Ofilia Neas, LPN   [id number]   Nurse Notes: None

## 2019-11-12 NOTE — Progress Notes (Signed)
Subjective:   Billy [name] is a 49 y.o. male who presents for Medicare Annual/Subsequent preventive examination.  I connected with Larita Fife today by telephone and verified that I am speaking with the correct person using two identifiers. Location patient: home Location provider: work Persons participating in the virtual visit: patient, provider.   I discussed the limitations, risks, security and privacy concerns of performing an evaluation and management service by telephone and the availability of in person appointments. I also discussed with the patient that there may be a patient responsible charge related to this service. The patient expressed understanding and verbally consented to this telephonic visit.    Interactive audio and video telecommunications were attempted between this provider and patient, however failed, due to patient having technical difficulties OR patient did not have access to video capability.  We continued and completed visit with audio only.      Review of Systems    N/A Cardiac Risk Factors include: male gender     Objective:    Today's Vitals   There is no height or weight on file to calculate BMI.  Advanced Directives 11/07/2019 05/01/2018 04/29/2018 03/22/2018 03/22/2018 12/16/2014 06/08/2014  Does Patient Have a Medical Advance Directive? No No No No No No No  Does patient want to make changes to medical advance directive? No - Patient declined - - - - - -  Would patient like information on creating a medical advance directive? - No - Patient declined No - Patient declined No - Patient declined - No - patient declined information No - patient declined information    Current Medications (verified) Outpatient Encounter Medications as of 11/07/2019  Medication Sig  . albuterol (PROAIR HFA) 108 (90 Base) MCG/ACT inhaler Inhale 1-2 puffs into the lungs every 6 (six) hours as needed for wheezing or shortness of breath.  Marland Kitchen aspirin 81 MG tablet  Take 2 tablets (161 mg total) by mouth daily.  . budesonide-formoterol (SYMBICORT) 160-4.5 MCG/ACT inhaler Inhale 2 puffs into the lungs 2 (two) times daily.  Marland Kitchen lisinopril (ZESTRIL) 20 MG tablet TAKE 1 TABLET BY MOUTH EVERY DAY  . tiotropium (SPIRIVA HANDIHALER) 18 MCG inhalation capsule PLACE 1 CAPSULE INTO INHALER AND INHALE DAILY.   No facility-administered encounter medications on file as of 11/07/2019.    Allergies (verified) Ativan [lorazepam]   History: Past Medical History:  Diagnosis Date  . Allergy    as a child- none as adult   . COPD (chronic obstructive pulmonary disease) ([place])   . History of bronchitis   . Hypertension   . Obesity   . Oxygen deficiency    Pt uses 02  3 liters with his CPAP ONLY   . PFO (patent foramen ovale)   . Polycythemia    a. Dr. Marin Olp.Marland KitchenMarland KitchenJAK-2 analysis is negative.  b. likely physiologic secondary to chronic hypoxia (? OSA + COPD + obesity).  c. s/p plebotomy x1  . PVD (peripheral vascular disease) ([place])   . Sleep apnea    a. mod severe by sleep study 9.30.11: AHI 44.8 per hour; oxygen desat to a nadir of 65%; unsuccessful CPAP titration; significant hypoxemia even with supp o2 at 3LPM (dest to 70-80%); needs eval for cardiopulmonary disease and upper airway obstruction  . Substance abuse ([place])    alcoholism   . Tobacco abuse    Past Surgical History:  Procedure Laterality Date  . knee injury  1982  . TONSILLECTOMY  1972  . TYMPANOSTOMY TUBE PLACEMENT  as child    Family History  Problem Relation Age of Onset  . Other Mother        Wegener's Disease  . Lung cancer Paternal Grandmother   . Drug abuse Father   . Alcohol abuse Father   . Asthma Father   . Colon cancer Neg Hx   . Colon polyps Neg Hx   . Esophageal cancer Neg Hx   . Rectal cancer Neg Hx   . Stomach cancer Neg Hx    Social History   Socioeconomic History  . Marital status: Divorced    Spouse name: Not on file  . Number of children: 2  . Years of education:  Not on file  . Highest education level: Not on file  Occupational History  . Occupation: unemployed  Tobacco Use  . Smoking status: Former Smoker    Packs/day: 0.50    Years: 25.00    Pack years: 12.50    Types: Cigarettes    Quit date: 03/21/2018    Years since quitting: 1.6  . Smokeless tobacco: Never Used  Vaping Use  . Vaping Use: Former  Substance and Sexual Activity  . Alcohol use: Yes    Alcohol/week: 76.0 standard drinks    Types: 36 Cans of beer, 40 Shots of liquor per week  . Drug use: Yes    Frequency: 1.0 times per week    Types: Marijuana    Comment: marajuana...also valium not prescribed  . Sexual activity: Not Currently  Other Topics Concern  . Not on file  Social History Narrative   05/01/2018:   Lives with mother on one level   Has two 42yo sons who live nearby   Enjoys hunting/fishing   Since being hospitalized for COPD exacerbation, has quit smoking, and is motivated to do aerobic exercise   Heavy alcohol consumer, does not see it as a problem, no interest in reducing alcohol intake.   Social Determinants of Health   Financial Resource Strain: Low Risk   . Difficulty of Paying Living Expenses: Not hard at all  Food Insecurity: No Food Insecurity  . Worried About Charity fundraiser in the Last Year: Never true  . Ran Out of Food in the Last Year: Never true  Transportation Needs: No Transportation Needs  . Lack of Transportation (Medical): No  . Lack of Transportation (Non-Medical): No  Physical Activity: Inactive  . Days of Exercise per Week: 0 days  . Minutes of Exercise per Session: 0 min  Stress: No Stress Concern Present  . Feeling of Stress : Not at all  Social Connections: Moderately Isolated  . Frequency of Communication with Friends and Family: More than three times a week  . Frequency of Social Gatherings with Friends and Family: More than three times a week  . Attends Religious Services: 1 to 4 times per year  . Active Member of Clubs or  Organizations: No  . Attends Archivist Meetings: Never  . Marital Status: Divorced    Tobacco Counseling Counseling given: Not Answered   Clinical Intake:  Pre-visit preparation completed: Yes  Pain : No/denies pain     Nutritional Risks: None Diabetes: No  How often do you need to have someone help you when you read instructions, pamphlets, or other written materials from your doctor or pharmacy?: 1 - Never What is the last grade level you completed in school?: Some College  Diabetic?No  Interpreter Needed?: No  Information entered by :: [place]   Activities  of Daily Living In your present state of health, do you have any difficulty performing the following activities: 11/07/2019  Hearing? N  Vision? N  Difficulty concentrating or making decisions? N  Walking or climbing stairs? N  Dressing or bathing? N  Doing errands, shopping? N  Preparing Food and eating ? N  Using the Toilet? N  In the past six months, have you accidently leaked urine? N  Do you have problems with loss of bowel control? N  Managing your Medications? N  Managing your Finances? N  Housekeeping or managing your Housekeeping? N  Some recent data might be hidden    Patient Care Team: Dorothyann Peng, NP as PCP - General (Family Medicine) Collene Gobble, MD as Consulting Physician (Pulmonary Disease) Earnie Larsson, [facility] as Pharmacist (Pharmacist)  Indicate any recent Medical Services you may have received from other than [facility] providers in the past year (date may be approximate).     Assessment:   This is a routine wellness examination for [employer].  Hearing/Vision screen  Hearing Screening   125Hz  250Hz  500Hz  1000Hz  2000Hz  3000Hz  4000Hz  6000Hz  8000Hz   Right ear:           Left ear:           Vision Screening Comments: Patient states does not have an eye doctor   Dietary issues and exercise activities discussed: Current Exercise Habits: The patient does not participate  in regular exercise at present, Exercise limited by: respiratory conditions(s)  Goals    . Patient Stated     Improve breathing, do pulmonary rehab and start swimming/going to [employer]    . Pharmacy Care Plan     Current Barriers:  . Chronic Disease Management support, education, and care coordination needs related to HTN, COPD, and History of tobacco use, Alcohol use, Obstructive sleep apnea, Polycythemia   Pharmacist Clinical Goal(s):  Marland Kitchen Maintain Blood pressure <130/80 mmHg  . Continue working on lifestyle modifications (diet/exercise). . Stay active, engage in at least 150 minutes per week of moderate-intensity exercise such as brisk walking (15- to 20-minute mile) or something similar.  . Continue with diet modifications (cutting back in sweets, portion control).  .  Interventions: . Comprehensive medication review performed. . Discussed diet and exercise modifications and effect on blood pressure.  . Discussed lifestyle modifications. (include nuts, legumes, vegetables, fruits in diet; aim for 30 minutes three times a week of physical activity).  Patient Self Care Activities:  . Calls provider office for new concerns or questions . Continue current medications as directed by providers.  . Continue at home blood pressure readings.  Initial goal documentation       Depression Screen PHQ 2/9 Scores 11/07/2019 05/01/2018 04/29/2018 06/09/2013 03/10/2013  PHQ - 2 Score 0 0 0 0 0  PHQ- 9 Score 0 3 3 - -    Fall Risk Fall Risk  11/07/2019 05/01/2018 04/29/2018 12/16/2014 06/08/2014  Falls in the past year? 0 0 0 No No  Number falls in past yr: 0 - - - -  Injury with Fall? 0 - - - -  Risk for fall due to : No Fall Risks - - - -  Follow up Falls evaluation completed;Falls prevention discussed - - - -    Any stairs in or around the home? Yes  If so, are there any without handrails? No  Home free of loose throw rugs in walkways, pet beds, electrical cords, etc? Yes  Adequate  lighting in your home to  reduce risk of falls? Yes   ASSISTIVE DEVICES UTILIZED TO PREVENT FALLS:  Life alert? No  Use of a cane, walker or w/c? No  Grab bars in the bathroom? No  Shower chair or bench in shower? No  Elevated toilet seat or a handicapped toilet? No     Cognitive Function:  Cognitive screening not indicated based on direct observation      Immunizations There is no immunization history for the selected administration types on file for this patient.  TDAP status: Due, Education has been provided regarding the importance of this vaccine. Advised may receive this vaccine at local pharmacy or Health Dept. Aware to provide a copy of the vaccination record if obtained from local pharmacy or Health Dept. Verbalized acceptance and understanding. Flu Vaccine status: Declined, Education has been provided regarding the importance of this vaccine but patient still declined. Advised may receive this vaccine at local pharmacy or Health Dept. Aware to provide a copy of the vaccination record if obtained from local pharmacy or Health Dept. Verbalized acceptance and understanding. Pneumococcal vaccine status: Declined,  Education has been provided regarding the importance of this vaccine but patient still declined. Advised may receive this vaccine at local pharmacy or Health Dept. Aware to provide a copy of the vaccination record if obtained from local pharmacy or Health Dept. Verbalized acceptance and understanding.  Covid-19 vaccine status: Declined, Education has been provided regarding the importance of this vaccine but patient still declined. Advised may receive this vaccine at local pharmacy or Health Dept.or vaccine clinic. Aware to provide a copy of the vaccination record if obtained from local pharmacy or Health Dept. Verbalized acceptance and understanding.  Qualifies for Shingles Vaccine? Yes   Zostavax completed No   Shingrix Completed?: No.    Education has been provided  regarding the importance of this vaccine. Patient has been advised to call insurance company to determine out of pocket expense if they have not yet received this vaccine. Advised may also receive vaccine at local pharmacy or Health Dept. Verbalized acceptance and understanding.  Screening Tests Health Maintenance  Topic Date Due  . COVID-19 Vaccine (1) Never done  . TETANUS/TDAP  08/04/2020 (Originally 08/16/1988)  . COLONOSCOPY  10/08/2029  . Hepatitis C Screening  Completed  . HIV Screening  Completed    Health Maintenance  Health Maintenance Due  Topic Date Due  . COVID-19 Vaccine (1) Never done    Colorectal cancer screening: Completed 10/09/2019. Repeat every 10 years  Lung Cancer Screening: (Low Dose CT Chest recommended if Age 52-80 years, 30 pack-year currently smoking OR have quit w/in 15years.) does not qualify.   Lung Cancer Screening Referral: N/A  Additional Screening:  Hepatitis C Screening: does qualify; Completed 08/20/2017  Vision Screening: Recommended annual ophthalmology exams for early detection of glaucoma and other disorders of the eye. Is the patient up to date with their annual eye exam?  No  Who is the provider or what is the name of the office in which the patient attends annual eye exams? Does not have one If pt is not established with a provider, would they like to be referred to a provider to establish care? No .   Dental Screening: Recommended annual dental exams for proper oral hygiene  Community Resource Referral / Chronic Care Management: CRR required this visit?  No   CCM required this visit?  No      Plan:     I have personally reviewed and noted the following in  the patient's chart:   . Medical and social history . Use of alcohol, tobacco or illicit drugs  . Current medications and supplements . Functional ability and status . Nutritional status . Physical activity . Advanced directives . List of other  physicians . Hospitalizations, surgeries, and ER visits in previous 12 months . Vitals . Screenings to include cognitive, depression, and falls . Referrals and appointments  In addition, I have reviewed and discussed with patient certain preventive protocols, quality metrics, and best practice recommendations. A written personalized care plan for preventive services as well as general preventive health recommendations were provided to patient.     Ofilia Neas, LPN   [id number]   Nurse Notes: None

## 2019-11-19 DIAGNOSIS — N5201 Erectile dysfunction due to arterial insufficiency: Secondary | ICD-10-CM | POA: Diagnosis not present

## 2019-11-21 ENCOUNTER — Other Ambulatory Visit: Payer: Self-pay | Admitting: Emergency Medicine

## 2019-11-21 ENCOUNTER — Other Ambulatory Visit (HOSPITAL_COMMUNITY)
Admission: RE | Admit: 2019-11-21 | Discharge: 2019-11-21 | Disposition: A | Discharge status: Home / Self Care | Payer: PPO | Source: Ambulatory Visit | Attending: Emergency Medicine | Admitting: Emergency Medicine

## 2019-11-21 DIAGNOSIS — Z01812 Encounter for preprocedural laboratory examination: Secondary | ICD-10-CM | POA: Insufficient documentation

## 2019-11-21 DIAGNOSIS — Z20822 Contact with and (suspected) exposure to covid-19: Secondary | ICD-10-CM | POA: Diagnosis not present

## 2019-11-21 DIAGNOSIS — J449 Chronic obstructive pulmonary disease, unspecified: Secondary | ICD-10-CM

## 2019-11-21 LAB — SARS CORONAVIRUS 2 (TAT 6-24 HRS): SARS Coronavirus 2: NEGATIVE

## 2019-11-24 ENCOUNTER — Ambulatory Visit: Payer: PPO | Admitting: Emergency Medicine

## 2019-11-24 ENCOUNTER — Other Ambulatory Visit: Payer: Self-pay

## 2019-11-24 ENCOUNTER — Ambulatory Visit (INDEPENDENT_AMBULATORY_CARE_PROVIDER_SITE_OTHER): Payer: PPO | Admitting: Emergency Medicine

## 2019-11-24 ENCOUNTER — Encounter: Payer: Self-pay | Admitting: Emergency Medicine

## 2019-11-24 DIAGNOSIS — J449 Chronic obstructive pulmonary disease, unspecified: Secondary | ICD-10-CM

## 2019-11-24 DIAGNOSIS — J9691 Respiratory failure, unspecified with hypoxia: Secondary | ICD-10-CM | POA: Diagnosis not present

## 2019-11-24 DIAGNOSIS — G4733 Obstructive sleep apnea (adult) (pediatric): Secondary | ICD-10-CM

## 2019-11-24 LAB — PULMONARY FUNCTION TEST
DL/VA % pred: 101 %
DL/VA: 4.46 ml/min/mmHg/L
DLCO cor % pred: 95 %
DLCO cor: 29.87 ml/min/mmHg
DLCO unc % pred: 95 %
DLCO unc: 29.87 ml/min/mmHg
FEF 25-75 Post: 1.07 L/sec
FEF 25-75 Pre: 0.72 L/sec
FEF2575-%Change-Post: 49 %
FEF2575-%Pred-Post: 29 %
FEF2575-%Pred-Pre: 19 %
FEV1-%Change-Post: 17 %
FEV1-%Pred-Post: 44 %
FEV1-%Pred-Pre: 37 %
FEV1-Post: 1.85 L
FEV1-Pre: 1.57 L
FEV1FVC-%Change-Post: 3 %
FEV1FVC-%Pred-Pre: 60 %
FEV6-%Change-Post: 14 %
FEV6-%Pred-Post: 71 %
FEV6-%Pred-Pre: 61 %
FEV6-Post: 3.71 L
FEV6-Pre: 3.23 L
FEV6FVC-%Change-Post: 1 %
FEV6FVC-%Pred-Post: 100 %
FEV6FVC-%Pred-Pre: 99 %
FVC-%Change-Post: 13 %
FVC-%Pred-Post: 70 %
FVC-%Pred-Pre: 62 %
FVC-Post: 3.82 L
FVC-Pre: 3.37 L
Post FEV1/FVC ratio: 48 %
Post FEV6/FVC ratio: 97 %
Pre FEV1/FVC ratio: 47 %
Pre FEV6/FVC Ratio: 96 %
RV % pred: 228 %
RV: 4.91 L
TLC % pred: 121 %
TLC: 8.98 L

## 2019-11-24 NOTE — Assessment & Plan Note (Signed)
Poor compliance lately, due to difficulty with humidity and mask fit.  I talked to him today about trying to get restarted.  He will try to do this.  He has been using oxygen in lieu of his CPAP.

## 2019-11-24 NOTE — Assessment & Plan Note (Signed)
Severe obstruction with a positive bronchodilator response noted on today's pulmonary function testing.  That being said he is clinically improved since he stopped smoking.  His FEV1 is 1.57 L, improved compared with 1.03 L in 2012.  Continue Symbicort 2 puffs twice a day.  Rinse and gargle after using. Stay off Spiriva for now.  We may decide to make an adjustment on everyday medication at some point going forward depending on how your breathing is doing. Keep your albuterol available to use 2 puffs if needed for shortness of breath, chest tightness, wheezing. Follow with Dr Lamonte Sakai in 6 months or sooner if you have any problems

## 2019-11-24 NOTE — Patient Instructions (Addendum)
Continue Symbicort 2 puffs twice a day.  Rinse and gargle after using. Stay off Spiriva for now.  We may decide to make an adjustment on everyday medication at some point going forward depending on how your breathing is doing. Keep your albuterol available to use 2 puffs if needed for shortness of breath, chest tightness, wheezing. Use your oxygen with exertion if you have any saturations less than 90%.  Our goal is to keep you greater than 90% Try to get back on your CPAP every night.  Your device may need to be adjusted to increase the humidity.  Let us know if we need to work on this. Follow with Dr Lamonte Sakai in 6 months or sooner if you have any problems

## 2019-11-24 NOTE — Progress Notes (Signed)
PFT done today. 

## 2019-11-24 NOTE — Assessment & Plan Note (Signed)
Not using oxygen with exertion currently.  He has not seen any exertional desaturations.

## 2019-11-24 NOTE — Progress Notes (Signed)
Subjective:    Patient ID: Billy [name], male    DOB: April 14, 1970, 49y.o.   MRN: [id number] HPI 49 yo male with Severe COPD, severe OSA, pulmonary HTN, hypoxemia   ROV 09/17/2018 --follow-up visit 49 year old obese gentleman with a history of severe COPD and obesity hypoventilation syndrome/OSA, chronic hypoxemia with some associated polycythemia. Currently managed on Spiriva and Symbicort, uses DuoNeb's or pro-air rarely now. He has auto-set 5-20 cmH2O CPAP, uses it every night - gets good benefit, more energy, fewer naps.  Download today shows 97% compliance for greater than 4 hours over the last month.  Intermittent compliance with his supplemental oxygen with exertion - does use it when he notes desats below 88%. He was doing Pulmonary rehab until the [place] isolation started - had to stop it. He hasn't restarted smoking!! He has gained about 14 lbs   ROV 11/24/19 -49 year old man, former heavy smoker but quit in the last year.  He has a history of severe COPD, obesity hypoventilation syndrome/OSA, polycythemia with chronic hypoxemia.  He is on CPAP, good compliance.  He is lost weight since I last saw him. Underwent pulmonary function testing day which I have reviewed, shows severe obstruction with a positive bronchodilator response, hyperinflated lung volumes and a normal diffusion capacity.  FEV1 1.57 L (37% predicted).  Currently managed on Symbicort. He stopped the Spiriva since last time. He has O2, does not use reliably w exertion - his SpO2 has been above 90% on RA. He does use sometimes at night.  He has decreased his CPAP use, doesn't feel that he is getting good mask fit, not enough humidity. He is going to work on getting back on it. He is able to walk a mile. He rarely uses albuterol at all.                                                                                                 Objective:   Physical Exam  Vitals:   11/24/19 1614  BP: 124/74  Pulse: 74  Temp: 98 F (36.7  C)  TempSrc: Temporal  SpO2: 96%  Weight: 285 lb 6.4 oz (129.5 kg)  Height: 6\' 1"  (1.854 m)   Gen: Pleasant, obese, in no distress,  normal affect  ENT: No lesions,  mouth clear,  oropharynx clear, no postnasal drip  Neck: No JVD, no stridor  Lungs: clear B, no wheezes  Cardiovascular: RRR, heart sounds normal, no murmur or gallops, trace LE edema  Musculoskeletal: No deformities, no cyanosis or clubbing  Neuro: alert, non focal  Skin: some chronic venous stasis changes     Assessment & Plan:  COPD (chronic obstructive pulmonary disease) (H[facility]) Severe obstruction with a positive bronchodilator response noted on today's pulmonary function testing.  That being said he is clinically improved since he stopped smoking.  His FEV1 is 1.57 L, improved compared with 1.03 L in 2012.  Continue Symbicort 2 puffs twice a day.  Rinse and gargle after using. Stay off Spiriva for now.  We may decide to make an adjustment on everyday medication at some point going forward depending on  how your breathing is doing. Keep your albuterol available to use 2 puffs if needed for shortness of breath, chest tightness, wheezing. Follow with Dr Lamonte Sakai in 6 months or sooner if you have any problems  Respiratory failure with hypoxia ([facility]) Not using oxygen with exertion currently.  He has not seen any exertional desaturations.  Obstructive sleep apnea Poor compliance lately, due to difficulty with humidity and mask fit.  I talked to him today about trying to get restarted.  He will try to do this.  He has been using oxygen in lieu of his CPAP.  Baltazar Apo, MD, PhD 11/24/2019, 5:06 PM [facility] [phone] or if no answer [phone]

## 2019-11-27 DIAGNOSIS — J449 Chronic obstructive pulmonary disease, unspecified: Secondary | ICD-10-CM | POA: Diagnosis not present

## 2019-12-28 DIAGNOSIS — J449 Chronic obstructive pulmonary disease, unspecified: Secondary | ICD-10-CM | POA: Diagnosis not present

## 2019-12-28 DIAGNOSIS — G4733 Obstructive sleep apnea (adult) (pediatric): Secondary | ICD-10-CM | POA: Diagnosis not present

## 2020-01-07 ENCOUNTER — Telehealth: Payer: Self-pay | Admitting: Pharmacist

## 2020-01-07 NOTE — Chronic Care Management (AMB) (Signed)
    Chronic Care Management Pharmacy Assistant   Name: Billy [name]  MRN: [id number] DOB: May 14, 1970   Reason for Encounter: Disease State/ COPD Adherence Call   PCP : Dorothyann Peng, NP  Allergies:   Allergies  Allergen Reactions  . Ativan [Lorazepam] Other (See Comments)    Pt given Ativan.  Pt became unresponsive.    Medications: Outpatient Encounter Medications as of 01/07/2020  Medication Sig  . albuterol (PROAIR HFA) 108 (90 Base) MCG/ACT inhaler Inhale 1-2 puffs into the lungs every 6 (six) hours as needed for wheezing or shortness of breath.  Marland Kitchen aspirin 81 MG tablet Take 2 tablets (161 mg total) by mouth daily.  . budesonide-formoterol (SYMBICORT) 160-4.5 MCG/ACT inhaler Inhale 2 puffs into the lungs 2 (two) times daily.  Marland Kitchen lisinopril (ZESTRIL) 20 MG tablet TAKE 1 TABLET BY MOUTH EVERY DAY  . tiotropium (SPIRIVA HANDIHALER) 18 MCG inhalation capsule PLACE 1 CAPSULE INTO INHALER AND INHALE DAILY. (Patient not taking: Reported on 11/24/2019)   No facility-administered encounter medications on file as of 01/07/2020.    Current Diagnosis: Patient Active Problem List   Diagnosis Date Noted  . Acute respiratory failure with hypoxia and hypercapnia ([place]) 03/22/2018  . Respiratory failure with hypoxia ([place]) 03/22/2018  . Allergic rhinitis 06/25/2012  . Polycythemia vera ([place]) 06/24/2012  . CONJUNCTIVITIS, BACTERIAL 12/22/2009  . Obstructive sleep apnea 12/14/2009  . PATENT FORAMEN [place] 11/17/2009  . HYPOXEMIA 11/17/2009  . Polycythemia, secondary 09/22/2009  . THROMBOCYTOPENIA 09/16/2009  . Alcohol abuse 09/16/2009  . COPD (chronic obstructive pulmonary disease) ([place]) 09/16/2009  . OBESITY 01/06/2009  . TOBACCO ABUSE 01/06/2009  . PERIPHERAL VASCULAR DISEASE 01/06/2009  . BRONCHITIS 01/06/2009    Goals Addressed   None    . Current COPD regimen:  . albuterol (PROAIR HFA) 108 (90 Base) MCG/ACT inhaler . budesonide-formoterol (SYMBICORT) 160-4.5 MCG/ACT  inhaler . tiotropium (SPIRIVA HANDIHALER) 18 MCG inhalation capsule . Any recent hospitalizations or ED visits since last visit with CPP? No . He denies COPD symptoms, including Increased shortness of breath , Rescue medicine is not helping, Shortness of breath at rest, Symptoms worse with exercise, Symptoms worse at night and Wheezing . What recent interventions/DTPs have been made by any provider to improve breathing since last visit: . Have you had exacerbation/flare-up since last visit? No . What do you do when you are short of breath?  Adhere to COPD Action Plan  Respiratory Devices/Equipment . Do you have a nebulizer? Yes . Do you use a Peak Flow Meter? No . Do you use a maintenance inhaler? Yes . How often do you forget to use your daily inhaler? No . Do you use a rescue inhaler? Yes . How often do you use your rescue inhaler?  infrequently . Do you use a spacer with your inhaler? No  Adherence Review: . Does the patient have >5 day gap between last estimated fill date for maintenance inhaler medications? No   Follow-Up:  Pharmacist Review   He states he has been doing well. He states he takes his Spiriva every other day. He has not had any issues with using It like that. The patient denies any side effects with his medication and denies any problems with his current pharmacy.  Maia Breslow, [place] Assistant [phone]

## 2020-01-15 ENCOUNTER — Other Ambulatory Visit: Payer: Self-pay | Admitting: Pulmonary Disease

## 2020-01-15 ENCOUNTER — Other Ambulatory Visit: Payer: Self-pay | Admitting: Adult Health

## 2020-01-27 DIAGNOSIS — J449 Chronic obstructive pulmonary disease, unspecified: Secondary | ICD-10-CM | POA: Diagnosis not present

## 2020-01-27 DIAGNOSIS — G4733 Obstructive sleep apnea (adult) (pediatric): Secondary | ICD-10-CM | POA: Diagnosis not present

## 2020-02-11 ENCOUNTER — Other Ambulatory Visit: Payer: Self-pay | Admitting: Adult Health

## 2020-02-27 DIAGNOSIS — J449 Chronic obstructive pulmonary disease, unspecified: Secondary | ICD-10-CM | POA: Diagnosis not present

## 2020-02-27 DIAGNOSIS — G4733 Obstructive sleep apnea (adult) (pediatric): Secondary | ICD-10-CM | POA: Diagnosis not present

## 2020-03-29 DIAGNOSIS — J449 Chronic obstructive pulmonary disease, unspecified: Secondary | ICD-10-CM | POA: Diagnosis not present

## 2020-03-29 DIAGNOSIS — G4733 Obstructive sleep apnea (adult) (pediatric): Secondary | ICD-10-CM | POA: Diagnosis not present

## 2020-04-15 ENCOUNTER — Ambulatory Visit (INDEPENDENT_AMBULATORY_CARE_PROVIDER_SITE_OTHER): Payer: Medicare Other | Admitting: Pharmacist

## 2020-04-15 ENCOUNTER — Other Ambulatory Visit: Payer: Self-pay

## 2020-04-15 ENCOUNTER — Encounter: Payer: Self-pay | Admitting: Adult Health

## 2020-04-15 ENCOUNTER — Ambulatory Visit (INDEPENDENT_AMBULATORY_CARE_PROVIDER_SITE_OTHER): Payer: Medicare Other | Admitting: Adult Health

## 2020-04-15 VITALS — BP 110/60 | Temp 98.1°F | Wt 285.0 lb

## 2020-04-15 DIAGNOSIS — B379 Candidiasis, unspecified: Secondary | ICD-10-CM | POA: Diagnosis not present

## 2020-04-15 DIAGNOSIS — I10 Essential (primary) hypertension: Secondary | ICD-10-CM | POA: Diagnosis not present

## 2020-04-15 DIAGNOSIS — J449 Chronic obstructive pulmonary disease, unspecified: Secondary | ICD-10-CM | POA: Diagnosis not present

## 2020-04-15 MED ORDER — FLUCONAZOLE 150 MG PO TABS: 150.0000 mg | ORAL_TABLET | ORAL | 0 refills | Status: AC

## 2020-04-15 NOTE — Progress Notes (Signed)
Subjective:    Patient ID: Billy [name], male    DOB: 07/23/1970, 50 y.o.   MRN: [id number]  HPI 50 year old male who  has a past medical history of Allergy, COPD (chronic obstructive pulmonary disease) ([place]), History of bronchitis, Hypertension, Obesity, Oxygen deficiency, PFO (patent foramen ovale), Polycythemia, PVD (peripheral vascular disease) ([place]), Sleep apnea, Substance abuse ([place]), and Tobacco abuse.  He presents to the office today for an acute issue of "raw spots on my penis".  Symptoms have been present for couple weeks.  Reports that his girlfriend who is undergoing cancer treatment was diagnosed with yeast infection and has been treated.  Denies concerns for STDs.  Has not noticed any drainage or odor   Review of Systems See HPI   Past Medical History:  Diagnosis Date  . Allergy    as a child- none as adult   . COPD (chronic obstructive pulmonary disease) ([place])   . History of bronchitis   . Hypertension   . Obesity   . Oxygen deficiency    Pt uses 02  3 liters with his CPAP ONLY   . PFO (patent foramen ovale)   . Polycythemia    a. Dr. Marin Olp.Marland KitchenMarland KitchenJAK-2 analysis is negative.  b. likely physiologic secondary to chronic hypoxia (? OSA + COPD + obesity).  c. s/p plebotomy x1  . PVD (peripheral vascular disease) ([place])   . Sleep apnea    a. mod severe by sleep study 9.30.11: AHI 44.8 per hour; oxygen desat to a nadir of 65%; unsuccessful CPAP titration; significant hypoxemia even with supp o2 at 3LPM (dest to 70-80%); needs eval for cardiopulmonary disease and upper airway obstruction  . Substance abuse ([place])    alcoholism   . Tobacco abuse     Social History   Socioeconomic History  . Marital status: Divorced    Spouse name: Not on file  . Number of children: 2  . Years of education: Not on file  . Highest education level: Not on file  Occupational History  . Occupation: unemployed  Tobacco Use  . Smoking status: Former Smoker    Packs/day: 0.50     Years: 25.00    Pack years: 12.50    Types: Cigarettes    Quit date: 03/21/2018    Years since quitting: 2.0  . Smokeless tobacco: Never Used  Vaping Use  . Vaping Use: Former  Substance and Sexual Activity  . Alcohol use: Yes    Alcohol/week: 76.0 standard drinks    Types: 36 Cans of beer, 40 Shots of liquor per week  . Drug use: Yes    Frequency: 1.0 times per week    Types: Marijuana    Comment: marajuana...also valium not prescribed  . Sexual activity: Not Currently  Other Topics Concern  . Not on file  Social History Narrative   05/01/2018:   Lives with mother on one level   Has two 17yo sons who live nearby   Enjoys hunting/fishing   Since being hospitalized for COPD exacerbation, has quit smoking, and is motivated to do aerobic exercise   Heavy alcohol consumer, does not see it as a problem, no interest in reducing alcohol intake.   Social Determinants of Health   Financial Resource Strain: Low Risk   . Difficulty of Paying Living Expenses: Not hard at all  Food Insecurity: No Food Insecurity  . Worried About Charity fundraiser in the Last Year: Never true  . Ran Out of Food in  the Last Year: Never true  Transportation Needs: No Transportation Needs  . Lack of Transportation (Medical): No  . Lack of Transportation (Non-Medical): No  Physical Activity: Inactive  . Days of Exercise per Week: 0 days  . Minutes of Exercise per Session: 0 min  Stress: No Stress Concern Present  . Feeling of Stress : Not at all  Social Connections: Moderately Isolated  . Frequency of Communication with Friends and Family: More than three times a week  . Frequency of Social Gatherings with Friends and Family: More than three times a week  . Attends Religious Services: 1 to 4 times per year  . Active Member of Clubs or Organizations: No  . Attends Archivist Meetings: Never  . Marital Status: Divorced  Human resources officer Violence: Not At Risk  . Fear of Current or Ex-Partner: No   . Emotionally Abused: No  . Physically Abused: No  . Sexually Abused: No    Past Surgical History:  Procedure Laterality Date  . knee injury  1982  . TONSILLECTOMY  1972  . TYMPANOSTOMY TUBE PLACEMENT     as child     Family History  Problem Relation Age of Onset  . Other Mother        Wegener's Disease  . Lung cancer Paternal Grandmother   . Drug abuse Father   . Alcohol abuse Father   . Asthma Father   . Colon cancer Neg Hx   . Colon polyps Neg Hx   . Esophageal cancer Neg Hx   . Rectal cancer Neg Hx   . Stomach cancer Neg Hx     Allergies  Allergen Reactions  . Ativan [Lorazepam] Other (See Comments)    Pt given Ativan.  Pt became unresponsive.    Current Outpatient Medications on File Prior to Visit  Medication Sig Dispense Refill  . albuterol (VENTOLIN HFA) 108 (90 Base) MCG/ACT inhaler INHALE 1-2 PUFFS INTO THE LUNGS EVERY 6 (SIX) HOURS AS NEEDED FOR WHEEZING OR SHORTNESS OF BREATH. 8.5 each 4  . aspirin 81 MG tablet Take 2 tablets (161 mg total) by mouth daily. 30 tablet 0  . lisinopril (ZESTRIL) 20 MG tablet TAKE 1 TABLET BY MOUTH EVERY DAY 90 tablet 3  . SYMBICORT 160-4.5 MCG/ACT inhaler TAKE 2 PUFFS BY MOUTH TWICE A DAY 10.2 each 5  . tiotropium (SPIRIVA HANDIHALER) 18 MCG inhalation capsule PLACE 1 CAPSULE INTO INHALER AND INHALE DAILY. 30 capsule 5   No current facility-administered medications on file prior to visit.    BP 110/60   Temp 98.1 F (36.7 C)   Wt 285 lb (129.3 kg)   BMI 37.60 kg/m       Objective:   Physical Exam Vitals and nursing note reviewed.  Constitutional:      Appearance: Normal appearance.  Genitourinary:    Penis: Circumcised. Erythema present. No discharge.      Testes: Normal.     Comments: Does have some localized erythemia noted around the head of the penis. This area appears raw. No active drainage  Musculoskeletal:        General: Normal range of motion.  Skin:    General: Skin is warm and dry.   Neurological:     General: No focal deficit present.     Mental Status: He is alert and oriented to person, place, and time.  Psychiatric:        Mood and Affect: Mood normal.        Behavior: Behavior  normal.        Thought Content: Thought content normal.        Judgment: Judgment normal.       Assessment & Plan:  1. Candida infection - fluconazole (DIFLUCAN) 150 MG tablet; Take 1 tablet (150 mg total) by mouth once a week for 3 doses.  Dispense: 3 tablet; Refill: 0 - mix small amount of OTC anti fungal and topical cortisone cream together and apply thin layer BID  - keep area clean and dry   Dorothyann Peng, NP

## 2020-04-15 NOTE — Progress Notes (Signed)
Chronic Care Management Pharmacy Note  04/15/2020 Name:  Billy [name] MRN:  [id number] DOB:  01/01/1971  Subjective: Billy [name] is an 50 y.o. year old male who is a primary patient of Dorothyann Peng, NP.  The CCM team was consulted for assistance with disease management and care coordination needs.    Engaged with patient face to face for follow up visit in response to provider referral for pharmacy case management and/or care coordination services.   Consent to Services:  The patient was given information about Chronic Care Management services, agreed to services, and gave verbal consent prior to initiation of services.  Please see initial visit note for detailed documentation.   Patient Care Team: Dorothyann Peng, NP as PCP - General (Family Medicine) Collene Gobble, MD as Consulting Physician (Pulmonary Disease) Earnie Larsson, [facility] as Pharmacist (Pharmacist)  Recent office visits: 11/07/19 Ofilia Neas, LPN: Patient presented for medicare annual wellness visit.  08/05/19 Dorothyann Peng, NP: Patient presented for annual exam. Placed referral to urology and gastro. No medication changes made.  Recent consult visits: 11/24/19 Baltazar Apo, MD (pulmonary): Patient presented for COPD follow up. Patient is managed on Symbicort and has stopped Spiriva.  11/19/19 Harold Barban (urology): Unable to access notes.   Hospital visits: None in previous 6 months  Objective:  Lab Results  Component Value Date   CREATININE 0.97 08/05/2019   BUN 11 08/05/2019   GFR 81.94 08/05/2019   GFRNONAA >60 03/26/2018   GFRAA >60 03/26/2018   NA 135 08/05/2019   K 4.6 08/05/2019   CALCIUM 9.6 08/05/2019   CO2 29 08/05/2019    Lab Results  Component Value Date/Time   HGBA1C 5.6 08/05/2019 10:15 AM   HGBA1C 5.2 01/01/2019 04:08 PM   GFR 81.94 08/05/2019 10:15 AM   GFR 79.30 01/01/2019 04:08 PM    Last diabetic Eye exam: No results found for: HMDIABEYEEXA  Last  diabetic Foot exam: No results found for: HMDIABFOOTEX   Lab Results  Component Value Date   CHOL 206 (H) 08/05/2019   HDL 58.70 08/05/2019   LDLCALC 130 (H) 08/05/2019   TRIG 88.0 08/05/2019   CHOLHDL 4 08/05/2019    Hepatic Function Latest Ref Rng & Units 08/05/2019 07/12/2018 03/26/2018  Total Protein 6.0 - 8.3 g/dL 6.9 6.5 6.1(L)  Albumin 3.5 - 5.2 g/dL 4.6 - 3.4(L)  AST 0 - 37 U/L 23 23 48(H)  ALT 0 - 53 U/L 22 27 66(H)  Alk Phosphatase 39 - 117 U/L 46 - 38  Total Bilirubin 0.2 - 1.2 mg/dL 0.9 0.8 1.5(H)  Bilirubin, Direct 0.0 - 0.3 mg/dL - - -    Lab Results  Component Value Date/Time   TSH 1.96 08/05/2019 10:15 AM   TSH 1.12 07/31/2017 01:40 PM    CBC Latest Ref Rng & Units 08/05/2019 01/01/2019 07/12/2018  WBC 4.0 - 10.5 K/uL 7.8 8.3 8.6  Hemoglobin 13.0 - 17.0 g/dL 16.5 16.4 14.4  Hematocrit 39.0 - 52.0 % 47.9 48.5 42.0  Platelets 150.0 - 400.0 K/uL 155.0 212.0 190    No results found for: VD25OH  Clinical ASCVD: No  The 10-year ASCVD risk score Mikey Bussing DC Jr., et al., 2013) is: 3.4%   Values used to calculate the score:     Age: 43 years     Sex: Male     Is Non-Hispanic African American: No     Diabetic: No     Tobacco smoker: No     Systolic Blood Pressure:  124 mmHg     Is BP treated: Yes     HDL Cholesterol: 58.7 mg/dL     Total Cholesterol: 206 mg/dL    Depression screen [facility] 2/9 11/07/2019 05/01/2018 04/29/2018  Decreased Interest 0 0 0  Down, Depressed, Hopeless 0 0 0  PHQ - 2 Score 0 0 0  Altered sleeping 0 1 1  Tired, decreased energy 0 1 1  Change in appetite 0 1 1  Feeling bad or failure about yourself  0 0 -  Trouble concentrating 0 0 0  Moving slowly or fidgety/restless 0 0 0  Suicidal thoughts 0 0 0  PHQ-9 Score 0 3 3  Difficult doing work/chores Not difficult at all - Not difficult at all      Social History   Tobacco Use  Smoking Status Former Smoker  . Packs/day: 0.50  . Years: 25.00  . Pack years: 12.50  . Types: Cigarettes  . Quit  date: 03/21/2018  . Years since quitting: 2.0  Smokeless Tobacco Never Used   BP Readings from Last 3 Encounters:  11/24/19 124/74  10/09/19 (!) 118/49  08/05/19 112/76   Pulse Readings from Last 3 Encounters:  11/24/19 74  10/09/19 72  06/30/19 (!) 107   Wt Readings from Last 3 Encounters:  11/24/19 285 lb 6.4 oz (129.5 kg)  10/09/19 284 lb (128.8 kg)  09/25/19 (!) 284 lb (128.8 kg)    Assessment/Interventions: Review of patient past medical history, allergies, medications, health status, including review of consultants reports, laboratory and other test data, was performed as part of comprehensive evaluation and provision of chronic care management services.   SDOH:  (Social Determinants of Health) assessments and interventions performed: No   CCM Care Plan  Allergies  Allergen Reactions  . Ativan [Lorazepam] Other (See Comments)    Pt given Ativan.  Pt became unresponsive.    Medications Reviewed Today    Reviewed by Collene Gobble, MD (Physician) on 11/24/19 at [place] List Status: <None>  Medication Order Taking? Sig Documenting Provider Last Dose Status Informant  albuterol (PROAIR HFA) 108 (90 Base) MCG/ACT inhaler [id number] Yes Inhale 1-2 puffs into the lungs every 6 (six) hours as needed for wheezing or shortness of breath. Parrett, Fonnie Mu, NP Taking Active   aspirin 81 MG tablet [id number] Yes Take 2 tablets (161 mg total) by mouth daily. Jonetta Osgood, MD Taking Active   budesonide-formoterol [facility]) 160-4.5 MCG/ACT inhaler [id number] Yes Inhale 2 puffs into the lungs 2 (two) times daily. Parrett, Fonnie Mu, NP Taking Active   lisinopril (ZESTRIL) 20 MG tablet [id number] Yes TAKE 1 TABLET BY MOUTH EVERY DAY Nafziger, Tommi Rumps, NP Taking Active   tiotropium (SPIRIVA HANDIHALER) 18 MCG inhalation capsule [id number] No PLACE 1 CAPSULE INTO INHALER AND INHALE DAILY.  Patient not taking: Reported on 11/24/2019   Melvenia Needles, NP Not Taking Active            Patient Active Problem List   Diagnosis Date Noted  . Acute respiratory failure with hypoxia and hypercapnia ([place]) 03/22/2018  . Respiratory failure with hypoxia ([place]) 03/22/2018  . Allergic rhinitis 06/25/2012  . Polycythemia vera ([place]) 06/24/2012  . CONJUNCTIVITIS, BACTERIAL 12/22/2009  . Obstructive sleep apnea 12/14/2009  . PATENT FORAMEN [place] 11/17/2009  . HYPOXEMIA 11/17/2009  . Polycythemia, secondary 09/22/2009  . THROMBOCYTOPENIA 09/16/2009  . Alcohol abuse 09/16/2009  . COPD (chronic obstructive pulmonary disease) ([place]) 09/16/2009  . OBESITY 01/06/2009  . TOBACCO ABUSE 01/06/2009  .  PERIPHERAL VASCULAR DISEASE 01/06/2009  . BRONCHITIS 01/06/2009    There is no immunization history for the selected administration types on file for this patient.  Conditions to be addressed/monitored:  Hypertension, COPD, Tobacco use and polycythemia, obstructive sleep apnea  There are no care plans that you recently modified to display for this patient.   Medication Assistance: None required.  Patient affirms current coverage meets needs.  [facility]  [id number] [place] [place]Phone: [phone]: [phone] [place] [address]Phone: [phone]: [phone] Uses pill box? No - has own system Pt endorses 100% compliance  We discussed: Current pharmacy is preferred with insurance plan and patient is satisfied with pharmacy services Patient decided to: Continue current medication management strategy  Care Plan and Follow Up Patient Decision:  Patient agrees to Care Plan and Follow-up.  Plan: The care management team will reach out to the patient again over the next 180 days.  MJeni Salles PharmD [facility]-MainClinical  Pharmacist [place]

## 2020-04-15 NOTE — Patient Instructions (Signed)
You have a yeast infection   I am going to prescribe you a pill for yeast infection. Take this weekly x 3 weeks   Also mix a small amount of over the counter anti fungal and hydrocortisone cream together and apply it twice daily.

## 2020-04-26 DIAGNOSIS — J449 Chronic obstructive pulmonary disease, unspecified: Secondary | ICD-10-CM | POA: Diagnosis not present

## 2020-04-26 DIAGNOSIS — G4733 Obstructive sleep apnea (adult) (pediatric): Secondary | ICD-10-CM | POA: Diagnosis not present

## 2020-04-26 NOTE — Patient Instructions (Signed)
Visit Information  Goals Addressed   None    Patient Care Plan: CCM Pharmacy Care Plan    Problem Identified: Problem: Hypertension, COPD, Tobacco use and polycythemia, obstructive sleep apnea     Long-Range Goal: Patient-Specific Goal   Start Date: 04/15/2020  Expected End Date: 04/15/2021  This Visit's Progress: On track  Priority: High  Note:   Current Barriers:  . Unable to independently monitor therapeutic efficacy  Pharmacist Clinical Goal(s):  Marland Kitchen Over the next 180 days, patient will achieve adherence to monitoring guidelines and medication adherence to achieve therapeutic efficacy . maintain control of blood pressure as evidenced by home blood pressure readings  through collaboration with PharmD and provider.   Interventions: . 1:1 collaboration with Dorothyann Peng, NP regarding development and update of comprehensive plan of care as evidenced by provider attestation and co-signature . Inter-disciplinary care team collaboration (see longitudinal plan of care) . Comprehensive medication review performed; medication list updated in electronic medical record  Hypertension (BP goal <130/80) -Controlled -Current treatment: . lisinopril 20 mg, 1 tablet once daily -Medications previously tried: none  -Current home readings: does not check at home -Current dietary habits: aware of portion control and is cutting back on food and losing weight -Current exercise habits: not walking consistently due to the weather -Denies hypotensive/hypertensive symptoms -Educated on Exercise goal of 150 minutes per week; Importance of home blood pressure monitoring; -Counseled to monitor BP at home weekly, document, and provide log at future appointments -Counseled on diet and exercise extensively Recommended to continue current medication  COPD (Goal: control symptoms and prevent exacerbations) -Controlled -Current treatment   Albuterol HFA, 2 puffs every 6 hours as needed for shortness of  breath - have used 2 in 3 years - usually around 93  Symbicort, 2 puffs twice daily   Spiriva 69mcg, 1 capsule into inhaler and inhaler daily (taking sporadically per recommendations by pulmonary)  Duoneb, 1 vial via nebulizer every 6 hours as needed -Medications previously tried: none -Gold Grade: Gold 2 (FEV1 50-79%) -Current COPD Classification:  A (low sx, <2 exacerbations/yr) -MMRC/CAT score: n/a -Pulmonary function testing: n/a -Exacerbations requiring treatment in last 6 months: none -Patient reports consistent use of maintenance inhaler -Frequency of rescue inhaler use: every other day -Counseled on Benefits of consistent maintenance inhaler use When to use rescue inhaler Differences between maintenance and rescue inhalers -Recommended to continue current medication  Erectile dysfunction (Goal: minimize symptoms) -Controlled -Current treatment   Viagra 25mg , (take 1 to 4 tablets as needed) -Medications previously tried: none  -Recommended to continue current medication  Polycythemia (Goal: prevent blood clots ) -Controlled -Current treatment  . Aspirin 81mg , 1 tablet daily -Medications previously tried: none  -Recommended to continue current medication   Health Maintenance -Vaccine gaps: Shingrix -Current therapy:  . None -Educated on Cost vs benefit of each product must be carefully weighed by individual consumer -Patient is satisfied with current therapy and denies issues -Recommended to continue current medication  Patient Goals/Self-Care Activities . Over the next 180 days, patient will:  - take medications as prescribed check blood pressure weekly, document, and provide at future appointments  Follow Up Plan: The care management team will reach out to the patient again over the next 180 days.        The patient verbalized understanding of instructions, educational materials, and care plan provided today and declined offer to receive copy of patient  instructions, educational materials, and care plan.  The pharmacy team will reach out to the  patient again over the next 180 days.   Viona Gilmore, [facility]

## 2020-05-03 ENCOUNTER — Other Ambulatory Visit: Payer: Self-pay

## 2020-05-03 ENCOUNTER — Encounter: Payer: Self-pay | Admitting: Family Medicine

## 2020-05-03 ENCOUNTER — Ambulatory Visit (INDEPENDENT_AMBULATORY_CARE_PROVIDER_SITE_OTHER): Payer: Medicare Other | Admitting: Family Medicine

## 2020-05-03 VITALS — BP 104/70 | HR 94 | Temp 98.7°F | Wt 291.8 lb

## 2020-05-03 DIAGNOSIS — M79605 Pain in left leg: Secondary | ICD-10-CM

## 2020-05-03 NOTE — Progress Notes (Signed)
   Subjective:    Patient ID: Raynard Mapps, male    DOB: 06-10-1970, 50 y.o.   MRN: [id number]  HPI Here for 2 weeks of swelling and pain in the left leg. No SOB or chest pain. No recent trauma, but he notes that shortly before this started he ran up and down a set of steps in the house multiple times. The pain seems centered around the knee.    Review of Systems  Constitutional: Negative.   Respiratory: Negative.   Cardiovascular: Positive for leg swelling. Negative for chest pain and palpitations.       Objective:   Physical Exam Constitutional:      General: He is not in acute distress.    Appearance: Normal appearance. He is obese.  Cardiovascular:     Rate and Rhythm: Normal rate and regular rhythm.     Pulses: Normal pulses.     Heart sounds: Normal heart sounds.  Pulmonary:     Effort: Pulmonary effort is normal.     Breath sounds: Normal breath sounds.  Musculoskeletal:     Comments: Left leg has 2+edema from the thigh down. He is tender in the distal posterior thigh and in the posterior knee. No cords are felt. Bevelyn Buckles is negative. No erythema or warmth .  Neurological:     Mental Status: He is alert.           Assessment & Plan:  Left leg pain and swelling. We will send him for a venous doppler to rule out a DVT.  Alysia Penna, MD

## 2020-05-04 ENCOUNTER — Ambulatory Visit (HOSPITAL_COMMUNITY)
Admission: RE | Admit: 2020-05-04 | Discharge: 2020-05-04 | Disposition: A | Discharge status: Home / Self Care | Payer: Medicare Other | Source: Ambulatory Visit | Attending: Internal Medicine | Admitting: Internal Medicine

## 2020-05-04 DIAGNOSIS — M79605 Pain in left leg: Secondary | ICD-10-CM | POA: Diagnosis not present

## 2020-05-26 ENCOUNTER — Other Ambulatory Visit: Payer: Self-pay

## 2020-05-26 ENCOUNTER — Ambulatory Visit (INDEPENDENT_AMBULATORY_CARE_PROVIDER_SITE_OTHER): Payer: Medicare Other | Admitting: Family Medicine

## 2020-05-26 ENCOUNTER — Encounter: Payer: Self-pay | Admitting: Family Medicine

## 2020-05-26 VITALS — BP 110/70 | HR 94 | Temp 97.9°F | Wt 286.0 lb

## 2020-05-26 DIAGNOSIS — M109 Gout, unspecified: Secondary | ICD-10-CM

## 2020-05-26 MED ORDER — METHYLPREDNISOLONE ACETATE 80 MG/ML IJ SUSP
80.0000 mg | Freq: Once | INTRAMUSCULAR | Status: AC
Start: 2020-05-26 — End: 2020-05-26
  Administered 2020-05-26: 80 mg via INTRAMUSCULAR

## 2020-05-26 MED ORDER — METHYLPREDNISOLONE 4 MG PO TBPK: ORAL_TABLET | ORAL | 0 refills | Status: DC

## 2020-05-26 MED ORDER — METHYLPREDNISOLONE ACETATE 40 MG/ML IJ SUSP
40.0000 mg | Freq: Once | INTRAMUSCULAR | Status: AC
  Administered 2020-05-26: 40 mg via INTRAMUSCULAR

## 2020-05-26 NOTE — Addendum Note (Signed)
Addended by: Wyvonne Lenz on: 05/26/2020 12:25 PM   Modules accepted: Orders

## 2020-05-26 NOTE — Progress Notes (Signed)
   Subjective:    Patient ID: Billy [name], male    DOB: 08/08/1970, 50 y.o.   MRN: [id number]  HPI Here for 4 weeks of pain and swelling in the left ankle. No hx of trauma. We saw him on 05-03-20 for more generalized left leg pain and sent him for a venous doppler which was negative. Since then the pain has ben more centered around the ankle and it has become much more severe. Of note he says he drinks a large amount of beer every night and he has done so for many years.    Review of Systems  Constitutional: Negative.   Respiratory: Negative.   Cardiovascular: Negative.   Musculoskeletal: Positive for arthralgias and joint swelling.       Objective:   Physical Exam Constitutional:      Comments: In pain, limping   Cardiovascular:     Rate and Rhythm: Normal rate and regular rhythm.     Pulses: Normal pulses.     Heart sounds: Normal heart sounds.  Pulmonary:     Effort: Pulmonary effort is normal.     Breath sounds: Normal breath sounds.  Musculoskeletal:     Comments: The left ankle is mildly swollen and very tender. He is tender around both medial and lateral malleoli and over the dorsal foot. No erythema or warmth. ROM is very limited due to pain   Neurological:     Mental Status: He is alert.           Assessment & Plan:  Left ankle pain, likely due to gout. We will give him a DepoMedrol shot today and he will follow this with a Medrol dose pack. Get labs to include uric acid. He agreed to decrease his alcohol intake.  Alysia Penna, MD

## 2020-05-26 NOTE — Addendum Note (Signed)
Addended by: Janann Colonel on: 05/26/2020 12:20 PM   Modules accepted: Orders

## 2020-05-26 NOTE — Addendum Note (Signed)
Addended by: Tessie Fass D on: 05/26/2020 02:25 PM   Modules accepted: Orders

## 2020-05-26 NOTE — Addendum Note (Signed)
Addended by: Tessie Fass D on: 05/26/2020 02:23 PM   Modules accepted: Orders

## 2020-05-27 ENCOUNTER — Other Ambulatory Visit: Payer: Medicare Other

## 2020-05-27 ENCOUNTER — Other Ambulatory Visit (INDEPENDENT_AMBULATORY_CARE_PROVIDER_SITE_OTHER): Payer: Medicare Other

## 2020-05-27 ENCOUNTER — Telehealth: Payer: Self-pay | Admitting: Adult Health

## 2020-05-27 DIAGNOSIS — M109 Gout, unspecified: Secondary | ICD-10-CM | POA: Diagnosis not present

## 2020-05-27 DIAGNOSIS — J449 Chronic obstructive pulmonary disease, unspecified: Secondary | ICD-10-CM | POA: Diagnosis not present

## 2020-05-27 DIAGNOSIS — G4733 Obstructive sleep apnea (adult) (pediatric): Secondary | ICD-10-CM | POA: Diagnosis not present

## 2020-05-27 LAB — CBC WITH DIFFERENTIAL/PLATELET
Basophils Absolute: 0 10*3/uL (ref 0.0–0.1)
Basophils Relative: 0.1 % (ref 0.0–3.0)
Eosinophils Absolute: 0 10*3/uL (ref 0.0–0.7)
Eosinophils Relative: 0 % (ref 0.0–5.0)
HCT: 44.2 % (ref 39.0–52.0)
Hemoglobin: 15.3 g/dL (ref 13.0–17.0)
Lymphocytes Relative: 5.8 % — ABNORMAL LOW (ref 12.0–46.0)
Lymphs Abs: 0.6 10*3/uL — ABNORMAL LOW (ref 0.7–4.0)
MCHC: 34.5 g/dL (ref 30.0–36.0)
MCV: 101.7 fl — ABNORMAL HIGH (ref 78.0–100.0)
Monocytes Absolute: 0.2 10*3/uL (ref 0.1–1.0)
Monocytes Relative: 2.1 % — ABNORMAL LOW (ref 3.0–12.0)
Neutro Abs: 10 10*3/uL — ABNORMAL HIGH (ref 1.4–7.7)
Neutrophils Relative %: 92 % — ABNORMAL HIGH (ref 43.0–77.0)
Platelets: 175 10*3/uL (ref 150.0–400.0)
RBC: 4.35 Mil/uL (ref 4.22–5.81)
RDW: 12.4 % (ref 11.5–15.5)
WBC: 10.9 10*3/uL — ABNORMAL HIGH (ref 4.0–10.5)

## 2020-05-27 LAB — URIC ACID: Uric Acid, Serum: 7.6 mg/dL (ref 4.0–7.8)

## 2020-05-27 LAB — SEDIMENTATION RATE: Sed Rate: 37 mm/hr — ABNORMAL HIGH (ref 0–20)

## 2020-05-27 NOTE — Telephone Encounter (Signed)
Pt is calling in stating that he is still not able to walk after getting the steroid shot on yesterday from Dr. Sarajane Jews and it did not touch the pain.  Pt is wanting to see if he can get something for pain b/c it is worse today.  [facility] [address] and pt would like for it to be noted in his chart that his mother will be picking up it from the pharmacy.

## 2020-05-28 LAB — RHEUMATOID FACTOR: Rheumatoid fact SerPl-aCnc: <14 IU/mL (ref <14)

## 2020-05-28 MED ORDER — TRAMADOL HCL 50 MG PO TABS: 100.0000 mg | ORAL_TABLET | Freq: Four times a day (QID) | ORAL | 0 refills | Status: DC | PRN

## 2020-05-28 NOTE — Telephone Encounter (Signed)
pt is aware that Dr Sarajane Jews sent in Tramadol to his pharmacy for pain

## 2020-05-28 NOTE — Addendum Note (Signed)
Addended by: Alysia Penna A on: 05/28/2020 01:12 PM   Modules accepted: Orders

## 2020-05-28 NOTE — Telephone Encounter (Signed)
Please advise 

## 2020-05-28 NOTE — Telephone Encounter (Signed)
I sent in some Tramadol

## 2020-05-31 ENCOUNTER — Other Ambulatory Visit: Payer: Medicare Other

## 2020-06-02 ENCOUNTER — Telehealth: Payer: Self-pay | Admitting: Adult Health

## 2020-06-02 NOTE — Telephone Encounter (Signed)
Pt call and stated he think he have a yeast infection and want some antibiotics call in he also stated he want a refill on prednisone [facility]  [id number] [place] [place] Phone:  [phone]  Fax:  [phone]

## 2020-06-02 NOTE — Telephone Encounter (Signed)
Will need office visit since he was just treated by another provider for gout

## 2020-06-02 NOTE — Telephone Encounter (Signed)
Are you okay with sending these medications?

## 2020-06-04 ENCOUNTER — Ambulatory Visit (INDEPENDENT_AMBULATORY_CARE_PROVIDER_SITE_OTHER): Payer: Medicare Other | Admitting: Adult Health

## 2020-06-04 ENCOUNTER — Other Ambulatory Visit: Payer: Self-pay

## 2020-06-04 ENCOUNTER — Encounter: Payer: Self-pay | Admitting: Adult Health

## 2020-06-04 VITALS — BP 100/68 | HR 78 | Temp 98.3°F | Ht 73.0 in | Wt 291.2 lb

## 2020-06-04 DIAGNOSIS — B379 Candidiasis, unspecified: Secondary | ICD-10-CM | POA: Diagnosis not present

## 2020-06-04 MED ORDER — METHYLPREDNISOLONE 4 MG PO TBPK: ORAL_TABLET | ORAL | 0 refills | Status: DC

## 2020-06-04 MED ORDER — FLUCONAZOLE 150 MG PO TABS: ORAL_TABLET | ORAL | 0 refills | Status: DC

## 2020-06-04 NOTE — Progress Notes (Signed)
Subjective:    Patient ID: Billy [name], male    DOB: [birth year], 50 y.o.   MRN: [id number]  HPI  50 year old male who  has a past medical history of Allergy, COPD (chronic obstructive pulmonary disease) ([place]), History of bronchitis, Hypertension, Obesity, Oxygen deficiency, PFO (patent foramen ovale), Polycythemia, PVD (peripheral vascular disease) ([place]), Sleep apnea, Substance abuse ([place]), and Tobacco abuse.  He presents to the office today for continued yeast infection.  He was originally seen on 04/15/2020 for "raw spots on my penis".  His symptoms have been present for a few weeks.  His girlfriend who was undergoing treatment for breast cancer was diagnosed with a yeast infection.  At this time he was prescribed Diflucan weekly for 3 weeks.  Reports that his symptoms nearly resolved, but his girlfriend was recently diagnosed with a another yeast infection and his symptoms are coming back.  Review of Systems See HPI   Past Medical History:  Diagnosis Date  . Allergy    as a child- none as adult   . COPD (chronic obstructive pulmonary disease) ([place])   . History of bronchitis   . Hypertension   . Obesity   . Oxygen deficiency    Pt uses 02  3 liters with his CPAP ONLY   . PFO (patent foramen ovale)   . Polycythemia    a. Dr. Marin Olp.Marland KitchenMarland KitchenJAK-2 analysis is negative.  b. likely physiologic secondary to chronic hypoxia (? OSA + COPD + obesity).  c. s/p plebotomy x1  . PVD (peripheral vascular disease) ([place])   . Sleep apnea    a. mod severe by sleep study 9.30.11: AHI 44.8 per hour; oxygen desat to a nadir of 65%; unsuccessful CPAP titration; significant hypoxemia even with supp o2 at 3LPM (dest to 70-80%); needs eval for cardiopulmonary disease and upper airway obstruction  . Substance abuse ([place])    alcoholism   . Tobacco abuse     Social History   Socioeconomic History  . Marital status: Divorced    Spouse name: Not on file  . Number of children: 2  . Years of  education: Not on file  . Highest education level: Not on file  Occupational History  . Occupation: unemployed  Tobacco Use  . Smoking status: Former Smoker    Packs/day: 0.50    Years: 25.00    Pack years: 12.50    Types: Cigarettes    Quit date: 03/21/2018    Years since quitting: 2.2  . Smokeless tobacco: Never Used  Vaping Use  . Vaping Use: Former  Substance and Sexual Activity  . Alcohol use: Yes    Alcohol/week: 76.0 standard drinks    Types: 36 Cans of beer, 40 Shots of liquor per week  . Drug use: Yes    Frequency: 1.0 times per week    Types: Marijuana    Comment: marajuana...also valium not prescribed  . Sexual activity: Not Currently  Other Topics Concern  . Not on file  Social History Narrative   05/01/2018:   Lives with mother on one level   Has two 76yo sons who live nearby   Enjoys hunting/fishing   Since being hospitalized for COPD exacerbation, has quit smoking, and is motivated to do aerobic exercise   Heavy alcohol consumer, does not see it as a problem, no interest in reducing alcohol intake.   Social Determinants of Health   Financial Resource Strain: Low Risk   . Difficulty of Paying Living Expenses: Not hard  at all  Food Insecurity: No Food Insecurity  . Worried About Charity fundraiser in the Last Year: Never true  . Ran Out of Food in the Last Year: Never true  Transportation Needs: No Transportation Needs  . Lack of Transportation (Medical): No  . Lack of Transportation (Non-Medical): No  Physical Activity: Inactive  . Days of Exercise per Week: 0 days  . Minutes of Exercise per Session: 0 min  Stress: No Stress Concern Present  . Feeling of Stress : Not at all  Social Connections: Moderately Isolated  . Frequency of Communication with Friends and Family: More than three times a week  . Frequency of Social Gatherings with Friends and Family: More than three times a week  . Attends Religious Services: 1 to 4 times per year  . Active Member  of Clubs or Organizations: No  . Attends Archivist Meetings: Never  . Marital Status: Divorced  Human resources officer Violence: Not At Risk  . Fear of Current or Ex-Partner: No  . Emotionally Abused: No  . Physically Abused: No  . Sexually Abused: No    Past Surgical History:  Procedure Laterality Date  . knee injury  1982  . TONSILLECTOMY  1972  . TYMPANOSTOMY TUBE PLACEMENT     as child     Family History  Problem Relation Age of Onset  . Other Mother        Wegener's Disease  . Lung cancer Paternal Grandmother   . Drug abuse Father   . Alcohol abuse Father   . Asthma Father   . Colon cancer Neg Hx   . Colon polyps Neg Hx   . Esophageal cancer Neg Hx   . Rectal cancer Neg Hx   . Stomach cancer Neg Hx     Allergies  Allergen Reactions  . Ativan [Lorazepam] Other (See Comments)    Pt given Ativan.  Pt became unresponsive.    Current Outpatient Medications on File Prior to Visit  Medication Sig Dispense Refill  . albuterol (VENTOLIN HFA) 108 (90 Base) MCG/ACT inhaler INHALE 1-2 PUFFS INTO THE LUNGS EVERY 6 (SIX) HOURS AS NEEDED FOR WHEEZING OR SHORTNESS OF BREATH. 8.5 each 4  . aspirin 81 MG tablet Take 2 tablets (161 mg total) by mouth daily. 30 tablet 0  . lisinopril (ZESTRIL) 20 MG tablet TAKE 1 TABLET BY MOUTH EVERY DAY 90 tablet 3  . SYMBICORT 160-4.5 MCG/ACT inhaler TAKE 2 PUFFS BY MOUTH TWICE A DAY 10.2 each 5  . tiotropium (SPIRIVA HANDIHALER) 18 MCG inhalation capsule PLACE 1 CAPSULE INTO INHALER AND INHALE DAILY. 30 capsule 5  . traMADol (ULTRAM) 50 MG tablet Take 2 tablets (100 mg total) by mouth every 6 (six) hours as needed. 60 tablet 0   No current facility-administered medications on file prior to visit.    BP 100/68 (BP Location: Left Arm, Patient Position: Sitting, Cuff Size: Large)   Pulse 78   Temp 98.3 F (36.8 C) (Oral)   Ht 6\' 1"  (1.854 m)   Wt 291 lb 3.2 oz (132.1 kg)   SpO2 93%   BMI 38.42 kg/m       Objective:   Physical  Exam Vitals and nursing note reviewed.  Constitutional:      Appearance: Normal appearance.  Skin:    General: Skin is warm and dry.     Findings: Erythema and rash present. No lesion.     Comments: Localized erythema noted around head of penis.  Rash  consistent with Candida  Neurological:     General: No focal deficit present.     Mental Status: He is alert and oriented to person, place, and time.  Psychiatric:        Mood and Affect: Mood normal.        Behavior: Behavior normal.        Thought Content: Thought content normal.        Judgment: Judgment normal.        Assessment & Plan:  1. Candida infection - fluconazole (DIFLUCAN) 150 MG tablet; Take one tablet weekly  Dispense: 3 tablet; Refill: 0 - keep area warm and dry  - Shower after sex - Follow up PRN   Dorothyann Peng, NP

## 2020-06-23 ENCOUNTER — Other Ambulatory Visit: Payer: Self-pay

## 2020-06-23 ENCOUNTER — Encounter: Payer: Self-pay | Admitting: Emergency Medicine

## 2020-06-23 ENCOUNTER — Ambulatory Visit: Payer: Medicare Other | Admitting: Emergency Medicine

## 2020-06-23 DIAGNOSIS — J9691 Respiratory failure, unspecified with hypoxia: Secondary | ICD-10-CM | POA: Diagnosis not present

## 2020-06-23 DIAGNOSIS — J449 Chronic obstructive pulmonary disease, unspecified: Secondary | ICD-10-CM

## 2020-06-23 DIAGNOSIS — G4733 Obstructive sleep apnea (adult) (pediatric): Secondary | ICD-10-CM | POA: Diagnosis not present

## 2020-06-23 NOTE — Progress Notes (Signed)
Subjective:    Patient ID: Billy [name], male    DOB: 1970/10/03, 49y.o.   MRN: [id number] HPI 50 yo male with Severe COPD, severe OSA, pulmonary HTN, hypoxemia   ROV 11/24/19 -89 year old man, former heavy smoker but quit in the last year.  He has a history of severe COPD, obesity hypoventilation syndrome/OSA, polycythemia with chronic hypoxemia.  He is on CPAP, good compliance.  He is lost weight since I last saw him. Underwent pulmonary function testing day which I have reviewed, shows severe obstruction with a positive bronchodilator response, hyperinflated lung volumes and a normal diffusion capacity.  FEV1 1.57 L (37% predicted).  Currently managed on Symbicort. He stopped the Spiriva since last time. He has O2, does not use reliably w exertion - his SpO2 has been above 90% on RA. He does use sometimes at night.  He has decreased his CPAP use, doesn't feel that he is getting good mask fit, not enough humidity. He is going to work on getting back on it. He is able to walk a mile. He rarely uses albuterol at all.   ROV 06/23/20 --follow-up visit 51 year old obese gentleman with a history of OSA/OHS, severe COPD, history of hypoxemia and associated polycythemia.  He has been compliant with CPAP in the past but reports today that he has not been wearing it reliably, has trouble with the mask and does not feel that he needs it.  He does wear his oxygen at night while sleeping every night.  He is using Symbicort reliably. Occasionally will use Spiriva. He does not require albuterol frequently.  Overall his breathing has been better since he stopped smoking in 2020.   His wt has been stable for the last few years at 285 lbs. He walks some. Hasn't seen any desaturations. He does get some SOB with carrying objects. Occasional wheeze, no cough. No flares since last time.                                                                                                 Objective:   Physical Exam  Vitals:    06/23/20 0902  BP: 116/80  Pulse: 82  Temp: (!) 97.3 F (36.3 C)  TempSrc: Temporal  SpO2: 95%  Weight: 285 lb 12.8 oz (129.6 kg)  Height: 6\' 1"  (1.854 m)   Gen: Pleasant, obese, in no distress,  normal affect  ENT: No lesions,  mouth clear,  oropharynx clear, no postnasal drip  Neck: No JVD, no stridor  Lungs: clear B, no wheezes  Cardiovascular: RRR, heart sounds normal, no murmur or gallops, no LE edema  Musculoskeletal: No deformities, no cyanosis or clubbing  Neuro: alert, non focal  Skin: some chronic venous stasis changes     Assessment & Plan:  Respiratory failure with hypoxia ([place]) This is improved since he is lost weight, improved with the therapy for his COPD.  He does not use oxygen with exertion.  He does have nocturnal hypoxemia that would be best treated with his CPAP but because of intolerance he uses O2 at night  Obstructive sleep apnea Difficulty tolerating CPAP.  He is using oxygen at night on most nights.  Discussed with him better compliance, possibility of adjusting mask, device.  COPD (chronic obstructive pulmonary disease) ([facility]) Reliable with his Symbicort.  No flares.  Minimal albuterol requirement.  Plan to continue same regimen.  He has improved significantly since he stopped smoking in 2020.  Baltazar Apo, MD, PhD 06/23/2020, 9:25 AM [facility] [phone] or if no answer [phone]

## 2020-06-23 NOTE — Patient Instructions (Signed)
Please continue Symbicort 2 puffs twice a day.  Rinse and gargle after using. We will hold off on restarting maintenance Spiriva for now Keep your albuterol available use 2 puffs when needed shortness of breath, chest tightness, wheezing You would benefit from wearing your CPAP at night as often as you can tolerate.  Let us know if there is anything we can do to try to help make the device easier to use. Continue your oxygen at night as you have been using it, either alone or bled in with your CPAP Follow with Dr Lamonte Sakai in 6 months or sooner if you have any problems

## 2020-06-23 NOTE — Assessment & Plan Note (Signed)
Reliable with his Symbicort.  No flares.  Minimal albuterol requirement.  Plan to continue same regimen.  He has improved significantly since he stopped smoking in 2020.

## 2020-06-23 NOTE — Assessment & Plan Note (Signed)
This is improved since he is lost weight, improved with the therapy for his COPD.  He does not use oxygen with exertion.  He does have nocturnal hypoxemia that would be best treated with his CPAP but because of intolerance he uses O2 at night

## 2020-06-23 NOTE — Assessment & Plan Note (Signed)
Difficulty tolerating CPAP.  He is using oxygen at night on most nights.  Discussed with him better compliance, possibility of adjusting mask, device.

## 2020-06-26 DIAGNOSIS — J449 Chronic obstructive pulmonary disease, unspecified: Secondary | ICD-10-CM | POA: Diagnosis not present

## 2020-06-26 DIAGNOSIS — G4733 Obstructive sleep apnea (adult) (pediatric): Secondary | ICD-10-CM | POA: Diagnosis not present

## 2020-07-13 NOTE — Telephone Encounter (Signed)
error 

## 2020-07-21 ENCOUNTER — Telehealth: Payer: Self-pay | Admitting: Pharmacist

## 2020-07-21 NOTE — Chronic Care Management (AMB) (Signed)
Chronic Care Management Pharmacy Assistant   Name: Billy [name]  MRN: [id number] DOB: [birth year]  Reason for Encounter: Disease State/ General Assessment Call.    Conditions to be addressed/monitored: HTN and COPD   Recent office visits:  06/04/20 Dorothyann Peng NP (PCP) - seen for candida infection. Patient started on fluconazole 150mg  one tablet weekly. Follow up as needed.   05/26/20 Alysia Penna MD (PCP) - seen for acute gout of left ankle. Patient started on methylprednisolone 4mg . Given methylprednisolone injections of 80 and 40mg  in office. Follow up as needed.   05/03/20 Alysia Penna MD (PCP) - seen for left leg pain. No medication changes. Ordered venous doppler for DVT. No follow up noted.   Recent consult visits:  06/23/20 Baltazar Apo MD (Pulmonology) - presented to clinic for respiratory failure with hypoxia and other chronic conditions. No medication changes. Follow up in 6 months.   Hospital visits:  None in previous 6 months  Medications: Outpatient Encounter Medications as of 07/21/2020  Medication Sig Note  . albuterol (VENTOLIN HFA) 108 (90 Base) MCG/ACT inhaler INHALE 1-2 PUFFS INTO THE LUNGS EVERY 6 (SIX) HOURS AS NEEDED FOR WHEEZING OR SHORTNESS OF BREATH.   Marland Kitchen aspirin 81 MG tablet Take 2 tablets (161 mg total) by mouth daily.   . fluconazole (DIFLUCAN) 150 MG tablet Take one tablet weekly   . lisinopril (ZESTRIL) 20 MG tablet TAKE 1 TABLET BY MOUTH EVERY DAY   . methylPREDNISolone (MEDROL DOSEPAK) 4 MG TBPK tablet As directed   . SYMBICORT 160-4.5 MCG/ACT inhaler TAKE 2 PUFFS BY MOUTH TWICE A DAY   . tiotropium (SPIRIVA HANDIHALER) 18 MCG inhalation capsule PLACE 1 CAPSULE INTO INHALER AND INHALE DAILY. 06/23/2020: Not using everyday   . traMADol (ULTRAM) 50 MG tablet Take 2 tablets (100 mg total) by mouth every 6 (six) hours as needed.    No facility-administered encounter medications on file as of 07/21/2020.    . Current COPD regimen:   Albuterol  HFA, 2 puffs every 6 hours as needed for shortness of breath - have used 2 in 3 years - usually around 93  Symbicort, 2 puffs twice daily   Spiriva 21mcg, 1 capsule into inhaler and inhaler daily (taking sporadically per recommendations by pulmonary)  Duoneb, 1 vial via nebulizer every 6 hours as needed  . No flowsheet data found.  . Any recent hospitalizations or ED visits since last visit with CPP? No  . Denies COPD symptoms.  . What recent interventions/DTPs have been made by any provider to improve breathing since last visit: None.   . Have you had exacerbation/flare-up since last visit? No. Patient states he has not had a flare up in over 6 months.   . What do you do when you are short of breath?  Adhere to COPD Action Plan, Rescue medication and Rest.  Respiratory Devices/Equipment . Do you have a nebulizer? Yes . Do you use a Peak Flow Meter? No . Do you use a maintenance inhaler? Yes . How often do you forget to use your daily inhaler? Never.  . Do you use a rescue inhaler? Yes . How often do you use your rescue inhaler?  prn. Not really much at all unless he over exerts himself.  . Do you use a spacer with your inhaler? No  Adherence Review: . Does the patient have >5 day gap between last estimated fill date for maintenance inhaler medications? No  Reviewed chart prior to disease state call. Spoke with  patient regarding BP  Recent Office Vitals: BP Readings from Last 3 Encounters:  06/23/20 116/80  06/04/20 100/68  05/26/20 110/70   Pulse Readings from Last 3 Encounters:  06/23/20 82  06/04/20 78  05/26/20 94    Wt Readings from Last 3 Encounters:  06/23/20 285 lb 12.8 oz (129.6 kg)  06/04/20 291 lb 3.2 oz (132.1 kg)  05/26/20 286 lb (129.7 kg)     Kidney Function Lab Results  Component Value Date/Time   CREATININE 0.97 08/05/2019 10:15 AM   CREATININE 1.00 01/01/2019 04:08 PM   CREATININE 1.30 07/12/2018 04:02 PM   CREATININE 0.9 12/16/2014 02:07 PM    CREATININE 0.8 12/08/2013 08:40 AM   GFR 81.94 08/05/2019 10:15 AM   GFRNONAA >60 03/26/2018 03:15 AM   GFRAA >60 03/26/2018 03:15 AM    BMP Latest Ref Rng & Units 08/05/2019 01/01/2019 07/12/2018  Glucose 70 - 99 mg/dL 102(H) 106(H) 96  BUN 6 - 23 mg/dL 11 10 21   Creatinine 0.40 - 1.50 mg/dL 0.97 1.00 1.30  BUN/Creat Ratio 6 - 22 (calc) - - NOT APPLICABLE  Sodium 569 - 145 mEq/L 135 139 139  Potassium 3.5 - 5.1 mEq/L 4.6 4.2 4.4  Chloride 96 - 112 mEq/L 98 104 104  CO2 19 - 32 mEq/L 29 26 27   Calcium 8.4 - 10.5 mg/dL 9.6 9.5 9.6    . Current antihypertensive regimen:   lisinopril 20 mg, 1 tablet once daily  . How often are you checking your Blood Pressure? Not checked recently.   . Current home BP readings: none to report. The last time he checked it was when he went to the doctor. Patient states its been fine with his medications.   . What recent interventions/DTPs have been made by any provider to improve Blood Pressure control since last CPP Visit: None.   . Any recent hospitalizations or ED visits since last visit with CPP? No  . What diet changes have been made to improve Blood Pressure Control?  o Patient states he needs to better with his salt intake and eating better. Patient states he eats a lot of [place] and basically what he wants. Patient will do better.   . What exercise is being done to improve your Blood Pressure Control?  o Patient states he was walking until this problem with his leg and swelling recently. Patient states he goes up and down the stairs at his girlfriends house multiple times a day. Patient states he just opened his pool back up and will be swimming daily soon. Patient states he also does all the yard work.   Adherence Review: Is the patient currently on ACE/ARB medication? Yes Does the patient have >5 day gap between last estimated fill dates? No  Notes: Spoke with patient and reviewed in detail all medications as listed. Patient reports  taking all medications except the diflucan and the Spiriva inhaler. He states that he does not take the Spiriva except for as needed since he quit smoking because his breathing has gotten so much better. Patient reports no issues with his medications at this time. Patient states he is going to do better on what he eats. Patient states he's been having to rest more throughout the day to elevate his legs because of swelling. Patient states he has not checked his blood pressure recently because it has been better. I re encouraged patient to check his blood pressure once weekly and write the number down. Patient was agreeable and stated  he still has a cuff at home. Patient thanked me for my call.   Star Rating Drugs:   lisinopril 20 mg - last filled on 06/18/20 90DS at [facility] Pharmacist Assistant [phone]

## 2020-07-22 ENCOUNTER — Other Ambulatory Visit: Payer: Self-pay

## 2020-07-22 ENCOUNTER — Ambulatory Visit (INDEPENDENT_AMBULATORY_CARE_PROVIDER_SITE_OTHER): Payer: Medicare Other | Admitting: Internal Medicine

## 2020-07-22 ENCOUNTER — Encounter: Payer: Self-pay | Admitting: Internal Medicine

## 2020-07-22 VITALS — BP 102/64 | HR 88 | Temp 98.2°F | Wt 298.5 lb

## 2020-07-22 DIAGNOSIS — R600 Localized edema: Secondary | ICD-10-CM | POA: Diagnosis not present

## 2020-07-22 DIAGNOSIS — I872 Venous insufficiency (chronic) (peripheral): Secondary | ICD-10-CM

## 2020-07-22 NOTE — Progress Notes (Signed)
Acute office Visit     This visit occurred during the SARS-CoV-2 public health emergency.  Safety protocols were in place, including screening questions prior to the visit, additional usage of staff PPE, and extensive cleaning of exam room while observing appropriate contact time as indicated for disinfecting solutions.    CC/Reason for Visit: Bilateral leg swelling  HPI: Billy [name] is a 50 y.o. male who is coming in today for the above mentioned reasons.  Both legs are swollen but the right more so than the left.  History of chronic venous insufficiency but had not been wearing his compression stockings.  In March he had an episode of unilateral leg swelling and was sent for Dopplers that were negative for DVT.  About 2 weeks ago he was helping a friend doing yard work and was more active than usual.  Shortly thereafter he started noticing increased leg edema.  He has noticed some tortuosity over the back of his right calf.  He was concerned about the possibility of recurrent blood clots and this prompted his visit today.  Past Medical/Surgical History: Past Medical History:  Diagnosis Date  . Allergy    as a child- none as adult   . COPD (chronic obstructive pulmonary disease) ([place])   . History of bronchitis   . Hypertension   . Obesity   . Oxygen deficiency    Pt uses 02  3 liters with his CPAP ONLY   . PFO (patent foramen ovale)   . Polycythemia    a. Dr. Marin Olp.Marland KitchenMarland KitchenJAK-2 analysis is negative.  b. likely physiologic secondary to chronic hypoxia (? OSA + COPD + obesity).  c. s/p plebotomy x1  . PVD (peripheral vascular disease) ([place])   . Sleep apnea    a. mod severe by sleep study 9.30.11: AHI 44.8 per hour; oxygen desat to a nadir of 65%; unsuccessful CPAP titration; significant hypoxemia even with supp o2 at 3LPM (dest to 70-80%); needs eval for cardiopulmonary disease and upper airway obstruction  . Substance abuse ([place])    alcoholism   . Tobacco abuse      Past Surgical History:  Procedure Laterality Date  . knee injury  1982  . TONSILLECTOMY  1972  . TYMPANOSTOMY TUBE PLACEMENT     as child     Social History:  reports that he quit smoking about 2 years ago. His smoking use included cigarettes. He has a 12.50 pack-year smoking history. He has never used smokeless tobacco. He reports current alcohol use of about 76.0 standard drinks of alcohol per week. He reports current drug use. Frequency: 1.00 time per week. Drug: Marijuana.  Allergies: Allergies  Allergen Reactions  . Ativan [Lorazepam] Other (See Comments)    Pt given Ativan.  Pt became unresponsive.    Family History:  Family History  Problem Relation Age of Onset  . Other Mother        Wegener's Disease  . Lung cancer Paternal Grandmother   . Drug abuse Father   . Alcohol abuse Father   . Asthma Father   . Colon cancer Neg Hx   . Colon polyps Neg Hx   . Esophageal cancer Neg Hx   . Rectal cancer Neg Hx   . Stomach cancer Neg Hx      Current Outpatient Medications:  .  albuterol (VENTOLIN HFA) 108 (90 Base) MCG/ACT inhaler, INHALE 1-2 PUFFS INTO THE LUNGS EVERY 6 (SIX) HOURS AS NEEDED FOR WHEEZING OR SHORTNESS OF BREATH., Disp:  8.5 each, Rfl: 4 .  aspirin 81 MG tablet, Take 2 tablets (161 mg total) by mouth daily., Disp: 30 tablet, Rfl: 0 .  lisinopril (ZESTRIL) 20 MG tablet, TAKE 1 TABLET BY MOUTH EVERY DAY, Disp: 90 tablet, Rfl: 3 .  methylPREDNISolone (MEDROL DOSEPAK) 4 MG TBPK tablet, As directed, Disp: 21 tablet, Rfl: 0 .  SYMBICORT 160-4.5 MCG/ACT inhaler, TAKE 2 PUFFS BY MOUTH TWICE A DAY, Disp: 10.2 each, Rfl: 5 .  tiotropium (SPIRIVA HANDIHALER) 18 MCG inhalation capsule, PLACE 1 CAPSULE INTO INHALER AND INHALE DAILY., Disp: 30 capsule, Rfl: 5 .  traMADol (ULTRAM) 50 MG tablet, Take 2 tablets (100 mg total) by mouth every 6 (six) hours as needed., Disp: 60 tablet, Rfl: 0  Review of Systems:  Constitutional: Denies fever, chills, diaphoresis, appetite  change and fatigue.  HEENT: Denies photophobia, eye pain, redness, hearing loss, ear pain, congestion, sore throat, rhinorrhea, sneezing, mouth sores, trouble swallowing, neck pain, neck stiffness and tinnitus.   Respiratory: Denies SOB, DOE, cough, chest tightness,  and wheezing.   Cardiovascular: Denies chest pain, palpitations. Gastrointestinal: Denies nausea, vomiting, abdominal pain, diarrhea, constipation, blood in stool and abdominal distention.  Genitourinary: Denies dysuria, urgency, frequency, hematuria, flank pain and difficulty urinating.  Endocrine: Denies: hot or cold intolerance, sweats, changes in hair or nails, polyuria, polydipsia. Musculoskeletal: Denies myalgias, back pain, joint swelling, arthralgias and gait problem.  Skin: Denies pallor, rash and wound.  Neurological: Denies dizziness, seizures, syncope, weakness, light-headedness, numbness and headaches.  Hematological: Denies adenopathy. Easy bruising, personal or family bleeding history  Psychiatric/Behavioral: Denies suicidal ideation, mood changes, confusion, nervousness, sleep disturbance and agitation    Physical Exam: Vitals:   07/22/20 1530  BP: 102/64  Pulse: 88  Temp: 98.2 F (36.8 C)  TempSrc: Oral  SpO2: 95%  Weight: 298 lb 8 oz (135.4 kg)    Body mass index is 39.38 kg/m.   Constitutional: NAD, calm, comfortable Eyes: PERRL, lids and conjunctivae normal ENMT: Mucous membranes are moist.  Respiratory: clear to auscultation bilaterally, no wheezing, no crackles. Normal respiratory effort. No accessory muscle use.  Cardiovascular: Regular rate and rhythm, no murmurs / rubs / gallops.  2+ pitting bilateral lower extremity edema right greater than left 2+ pedal pulses.    Skin: Darkened skin changes to bilateral lower extremities Neurologic: Grossly intact and nonfocal Psychiatric: Normal judgment and insight. Alert and oriented x 3. Normal mood.    Impression and Plan:  Bilateral leg  edema  Chronic venous insufficiency  -With his history of chronic venous insufficiency, vein tortuosity noted in the back of the right calf and darkened skin color changes on both lower legs, I suspect etiology is chronic venous insufficiency. -DVT seems much less likely, especially in light of recent negative Dopplers. -I have advised use of compression stockings. -He will follow-up with further concerns.    Lelon Frohlich, MD [facility]

## 2020-07-27 DIAGNOSIS — J449 Chronic obstructive pulmonary disease, unspecified: Secondary | ICD-10-CM | POA: Diagnosis not present

## 2020-07-27 DIAGNOSIS — G4733 Obstructive sleep apnea (adult) (pediatric): Secondary | ICD-10-CM | POA: Diagnosis not present

## 2020-08-26 ENCOUNTER — Other Ambulatory Visit: Payer: Self-pay | Admitting: Emergency Medicine

## 2020-08-26 DIAGNOSIS — J449 Chronic obstructive pulmonary disease, unspecified: Secondary | ICD-10-CM | POA: Diagnosis not present

## 2020-08-26 DIAGNOSIS — G4733 Obstructive sleep apnea (adult) (pediatric): Secondary | ICD-10-CM | POA: Diagnosis not present

## 2020-09-17 ENCOUNTER — Other Ambulatory Visit: Payer: Self-pay | Admitting: Adult Health

## 2020-09-17 DIAGNOSIS — I10 Essential (primary) hypertension: Secondary | ICD-10-CM

## 2020-09-26 DIAGNOSIS — G4733 Obstructive sleep apnea (adult) (pediatric): Secondary | ICD-10-CM | POA: Diagnosis not present

## 2020-09-26 DIAGNOSIS — J449 Chronic obstructive pulmonary disease, unspecified: Secondary | ICD-10-CM | POA: Diagnosis not present

## 2020-10-03 IMAGING — DX DG HIP (WITH OR WITHOUT PELVIS) 4+V RIGHT
3 series · 3 of 3 positions shown · non-contrast
Comparison: None.

CLINICAL DATA: Right hip pain

EXAM:
DG HIP (WITH OR WITHOUT PELVIS) 3V RIGHT

[pelvis ap]
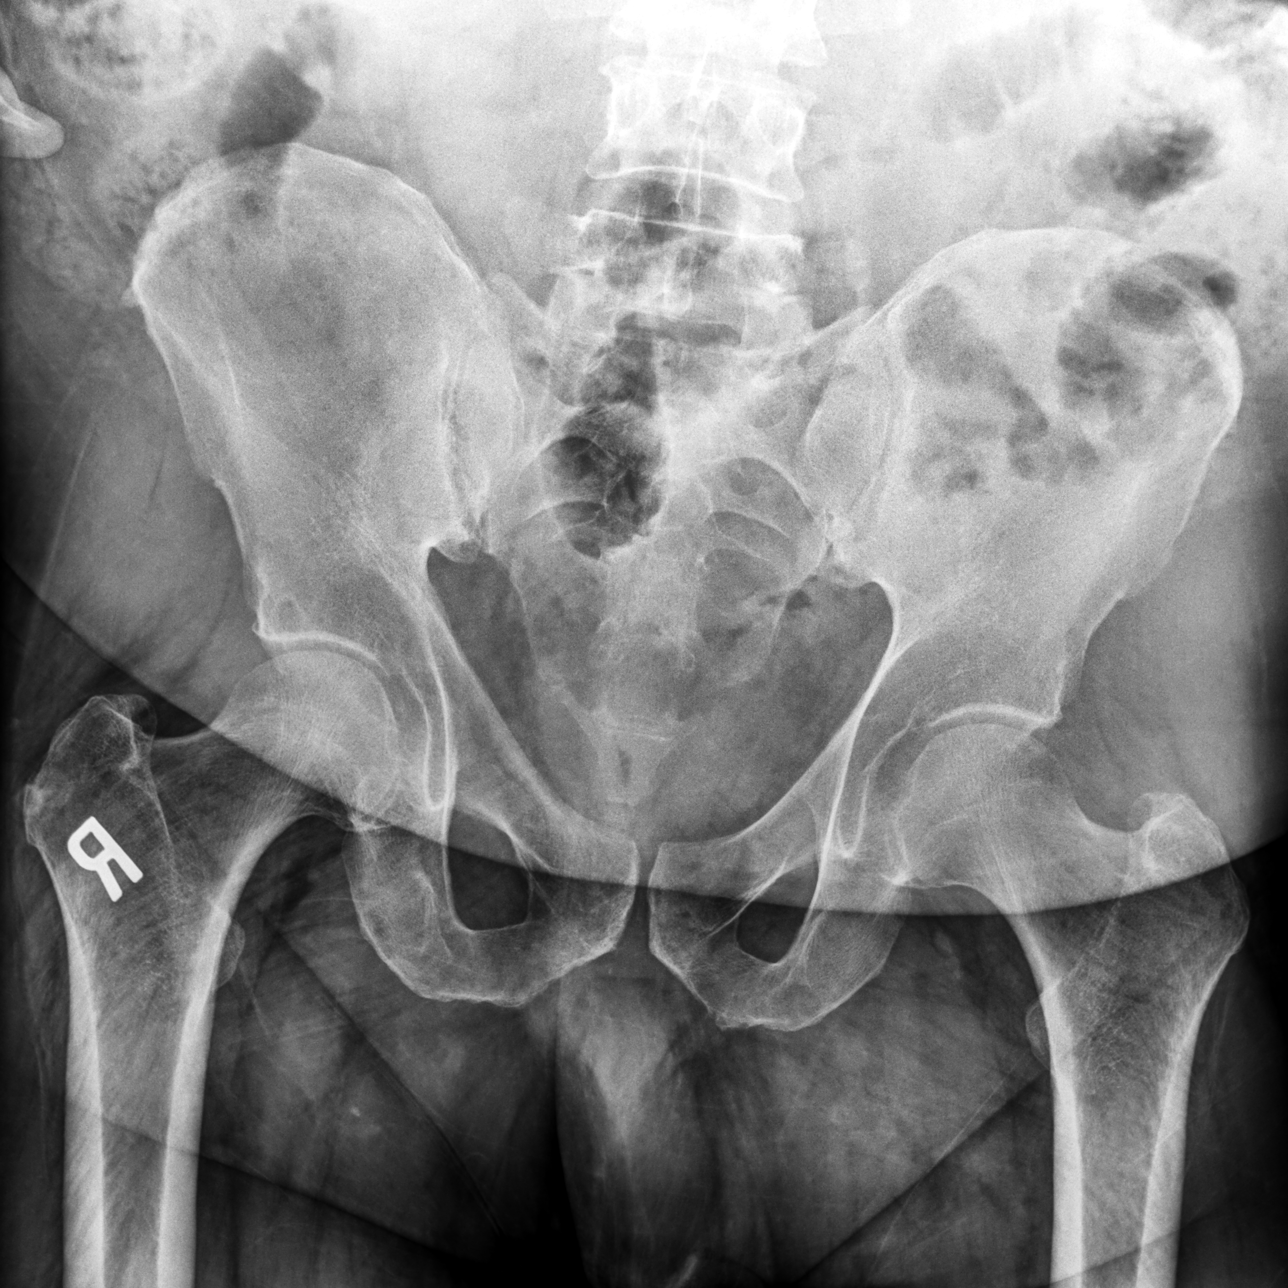

[hip joint ap]
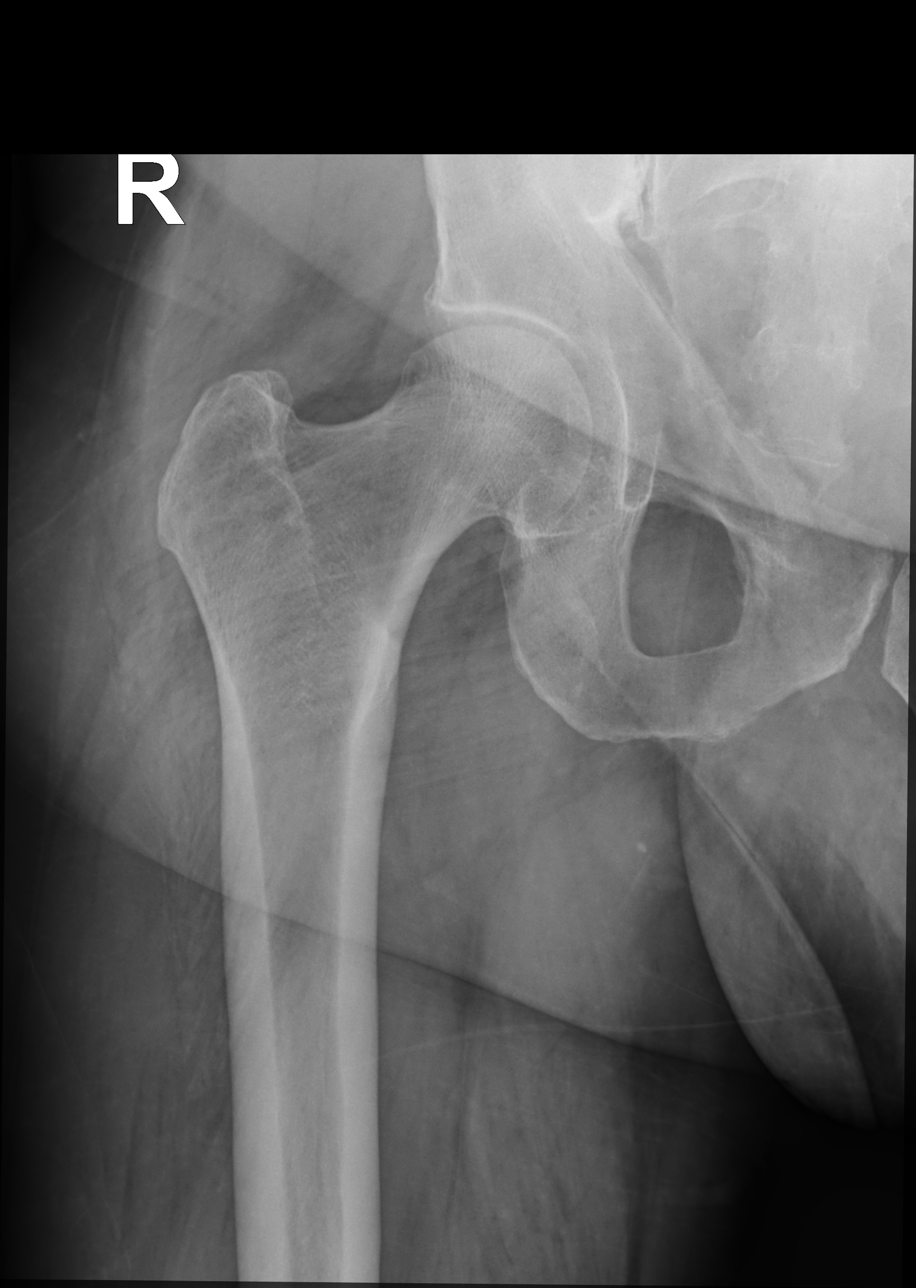

[hip (frog leg) lat]
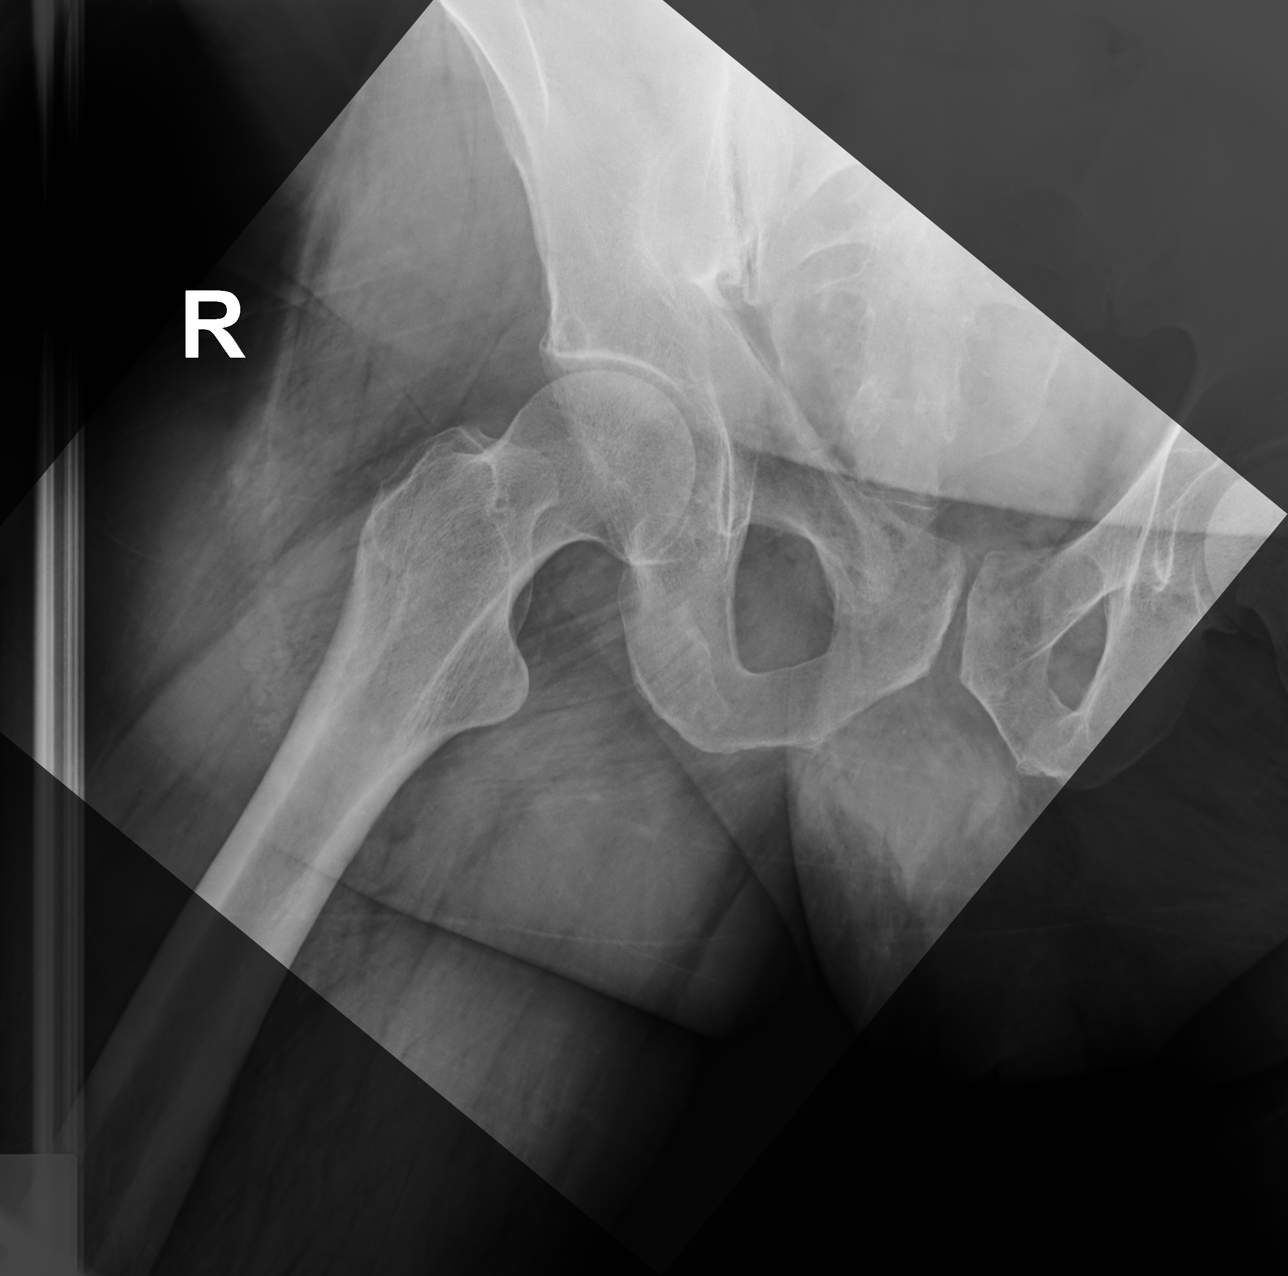

[3 of 3 positions shown; findings below may reference images not displayed]

FINDINGS: There is no evidence of hip fracture or dislocation. There is no
evidence of arthropathy or other focal bone abnormality.
IMPRESSION: No acute abnormality noted.

## 2020-10-03 IMAGING — DX RIGHT KNEE - 1-2 VIEW
2 series · 2 of 2 positions shown · non-contrast
Comparison: None.

CLINICAL DATA: Right hip and knee pain

EXAM:
RIGHT KNEE - 1-2 VIEW

[knee ap]
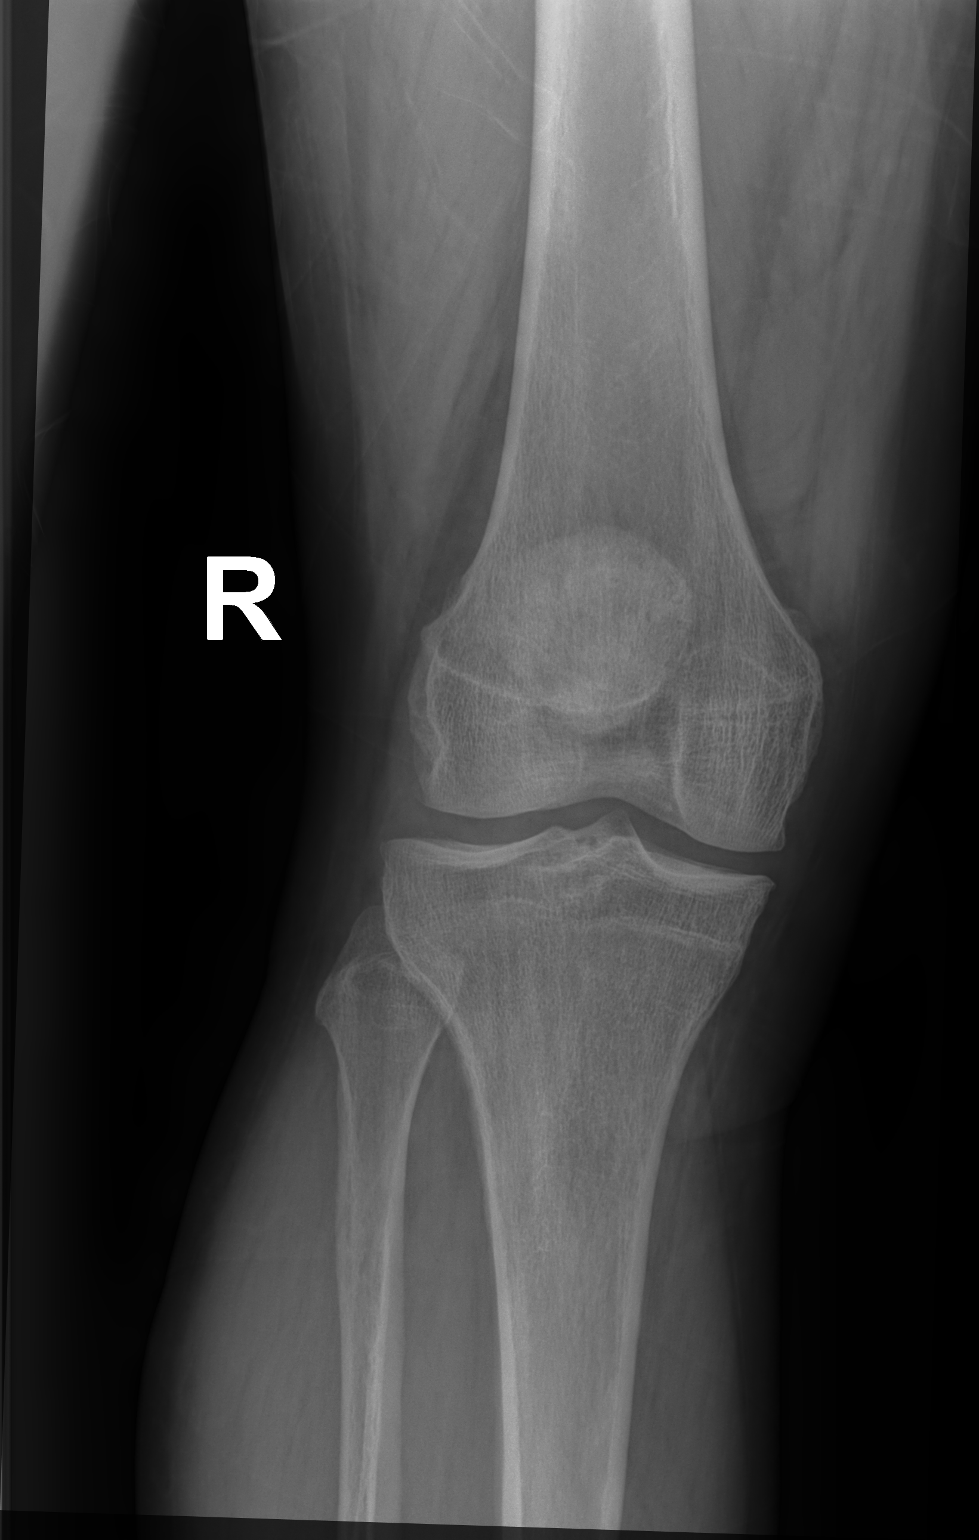

[knee lat]
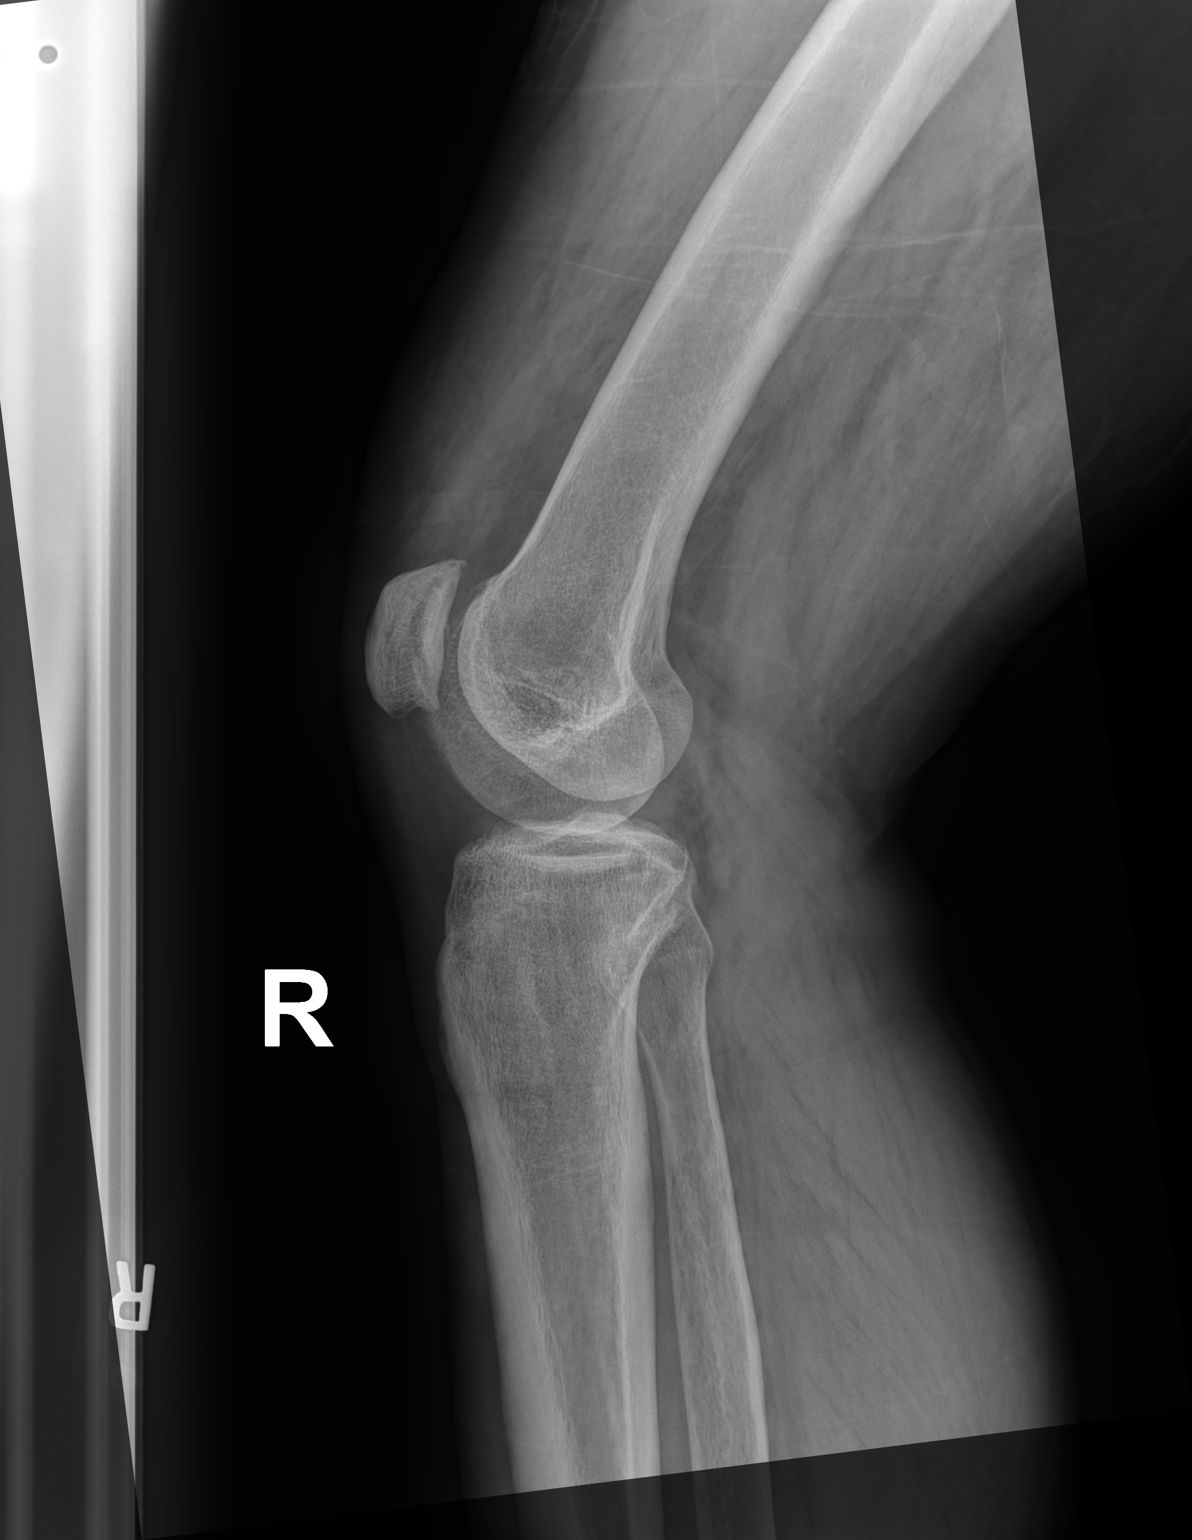

[2 of 2 positions shown; findings below may reference images not displayed]

FINDINGS: No acute fracture. No dislocation.  Unremarkable soft tissues.
IMPRESSION: No acute bony pathology.

## 2020-10-27 DIAGNOSIS — G4733 Obstructive sleep apnea (adult) (pediatric): Secondary | ICD-10-CM | POA: Diagnosis not present

## 2020-10-29 ENCOUNTER — Telehealth: Payer: Self-pay

## 2020-10-29 MED ORDER — FLUCONAZOLE 150 MG PO TABS: 150.0000 mg | ORAL_TABLET | Freq: Once | ORAL | 3 refills | Status: AC

## 2020-10-29 NOTE — Telephone Encounter (Signed)
Patient called stating his girlfriend has an yeast infection and he would like an Rx for himself for the yeast infection so they don't keep passing it to each other. Patient stated he did not want to make an appt since her Dr. diagnosed her.

## 2020-10-29 NOTE — Addendum Note (Signed)
Addended by: Nilda Riggs on: 10/29/2020 11:31 AM   Modules accepted: Orders

## 2020-10-29 NOTE — Telephone Encounter (Signed)
Rx sent to pt's pharmacy

## 2020-10-29 NOTE — Addendum Note (Signed)
Addended by: Nilda Riggs on: 10/29/2020 11:03 AM   Modules accepted: Orders

## 2020-11-09 ENCOUNTER — Ambulatory Visit: Payer: PPO

## 2020-11-10 ENCOUNTER — Other Ambulatory Visit: Payer: Self-pay

## 2020-11-10 ENCOUNTER — Ambulatory Visit (INDEPENDENT_AMBULATORY_CARE_PROVIDER_SITE_OTHER): Payer: Medicare Other

## 2020-11-10 DIAGNOSIS — Z0000 Encounter for general adult medical examination without abnormal findings: Secondary | ICD-10-CM

## 2020-11-10 NOTE — Patient Instructions (Signed)
Billy [name] , Thank you for taking time to come for your Medicare Wellness Visit. I appreciate your ongoing commitment to your health goals. Please review the following plan we discussed and let me know if I can assist you in the future.   Screening recommendations/referrals: Colonoscopy: Done 10/09/19 repeat every 10 years due 10/08/29 Recommended yearly ophthalmology/optometry visit for glaucoma screening and checkup Recommended yearly dental visit for hygiene and checkup  Vaccinations: Influenza vaccine: not a candidate Pneumococcal vaccine: Due and discussed Tdap vaccine: Due Shingles vaccine: Shingrix discussed. Please contact your pharmacy for coverage information.     Covid-19: declined and discussed  Advanced directives: Advance directive discussed with you today. Even though you declined this today please call our office should you change your mind and we can give you the proper paperwork for you to fill out.  Conditions/risks identified: lose weight  Next appointment: Follow up in one year for your annual wellness visit   Preventive Care 40-64 Years, Male Preventive care refers to lifestyle choices and visits with your health care provider that can promote health and wellness. What does preventive care include? A yearly physical exam. This is also called an annual well check. Dental exams once or twice a year. Routine eye exams. Ask your health care provider how often you should have your eyes checked. Personal lifestyle choices, including: Daily care of your teeth and gums. Regular physical activity. Eating a healthy diet. Avoiding tobacco and drug use. Limiting alcohol use. Practicing safe sex. Taking low-dose aspirin every day starting at age 40. What happens during an annual well check? The services and screenings done by your health care provider during your annual well check will depend on your age, overall health, lifestyle risk factors, and family history of  disease. Counseling  Your health care provider may ask you questions about your: Alcohol use. Tobacco use. Drug use. Emotional well-being. Home and relationship well-being. Sexual activity. Eating habits. Work and work Statistician. Screening  You may have the following tests or measurements: Height, weight, and BMI. Blood pressure. Lipid and cholesterol levels. These may be checked every 5 years, or more frequently if you are over 35 years old. Skin check. Lung cancer screening. You may have this screening every year starting at age 41 if you have a 30-pack-year history of smoking and currently smoke or have quit within the past 15 years. Fecal occult blood test (FOBT) of the stool. You may have this test every year starting at age 66. Flexible sigmoidoscopy or colonoscopy. You may have a sigmoidoscopy every 5 years or a colonoscopy every 10 years starting at age 64. Prostate cancer screening. Recommendations will vary depending on your family history and other risks. Hepatitis C blood test. Hepatitis B blood test. Sexually transmitted disease (STD) testing. Diabetes screening. This is done by checking your blood sugar (glucose) after you have not eaten for a while (fasting). You may have this done every 1-3 years. Discuss your test results, treatment options, and if necessary, the need for more tests with your health care provider. Vaccines  Your health care provider may recommend certain vaccines, such as: Influenza vaccine. This is recommended every year. Tetanus, diphtheria, and acellular pertussis (Tdap, Td) vaccine. You may need a Td booster every 10 years. Zoster vaccine. You may need this after age 53. Pneumococcal 13-valent conjugate (PCV13) vaccine. You may need this if you have certain conditions and have not been vaccinated. Pneumococcal polysaccharide (PPSV23) vaccine. You may need one or two doses if you  smoke cigarettes or if you have certain conditions. Talk to your  health care provider about which screenings and vaccines you need and how often you need them. This information is not intended to replace advice given to you by your health care provider. Make sure you discuss any questions you have with your health care provider. Document Released: 03/12/2015 Document Revised: 11/03/2015 Document Reviewed: 12/15/2014 Elsevier Interactive Patient Education  2017 [place] Prevention in the Home Falls can cause injuries. They can happen to people of all ages. There are many things you can do to make your home safe and to help prevent falls. What can I do on the outside of my home? Regularly fix the edges of walkways and driveways and fix any cracks. Remove anything that might make you trip as you walk through a door, such as a raised step or threshold. Trim any bushes or trees on the path to your home. Use bright outdoor lighting. Clear any walking paths of anything that might make someone trip, such as rocks or tools. Regularly check to see if handrails are loose or broken. Make sure that both sides of any steps have handrails. Any raised decks and porches should have guardrails on the edges. Have any leaves, snow, or ice cleared regularly. Use sand or salt on walking paths during winter. Clean up any spills in your garage right away. This includes oil or grease spills. What can I do in the bathroom? Use night lights. Install grab bars by the toilet and in the tub and shower. Do not use towel bars as grab bars. Use non-skid mats or decals in the tub or shower. If you need to sit down in the shower, use a plastic, non-slip stool. Keep the floor dry. Clean up any water that spills on the floor as soon as it happens. Remove soap buildup in the tub or shower regularly. Attach bath mats securely with double-sided non-slip rug tape. Do not have throw rugs and other things on the floor that can make you trip. What can I do in the bedroom? Use night  lights. Make sure that you have a light by your bed that is easy to reach. Do not use any sheets or blankets that are too big for your bed. They should not hang down onto the floor. Have a firm chair that has side arms. You can use this for support while you get dressed. Do not have throw rugs and other things on the floor that can make you trip. What can I do in the kitchen? Clean up any spills right away. Avoid walking on wet floors. Keep items that you use a lot in easy-to-reach places. If you need to reach something above you, use a strong step stool that has a grab bar. Keep electrical cords out of the way. Do not use floor polish or wax that makes floors slippery. If you must use wax, use non-skid floor wax. Do not have throw rugs and other things on the floor that can make you trip. What can I do with my stairs? Do not leave any items on the stairs. Make sure that there are handrails on both sides of the stairs and use them. Fix handrails that are broken or loose. Make sure that handrails are as long as the stairways. Check any carpeting to make sure that it is firmly attached to the stairs. Fix any carpet that is loose or worn. Avoid having throw rugs at the top or bottom  of the stairs. If you do have throw rugs, attach them to the floor with carpet tape. Make sure that you have a light switch at the top of the stairs and the bottom of the stairs. If you do not have them, ask someone to add them for you. What else can I do to help prevent falls? Wear shoes that: Do not have high heels. Have rubber bottoms. Are comfortable and fit you well. Are closed at the toe. Do not wear sandals. If you use a stepladder: Make sure that it is fully opened. Do not climb a closed stepladder. Make sure that both sides of the stepladder are locked into place. Ask someone to hold it for you, if possible. Clearly [place] and make sure that you can see: Any grab bars or handrails. First and last  steps. Where the edge of each step is. Use tools that help you move around (mobility aids) if they are needed. These include: Canes. Walkers. Scooters. Crutches. Turn on the lights when you go into a dark area. Replace any light bulbs as soon as they burn out. Set up your furniture so you have a clear path. Avoid moving your furniture around. If any of your floors are uneven, fix them. If there are any pets around you, be aware of where they are. Review your medicines with your doctor. Some medicines can make you feel dizzy. This can increase your chance of falling. Ask your doctor what other things that you can do to help prevent falls. This information is not intended to replace advice given to you by your health care provider. Make sure you discuss any questions you have with your health care provider. Document Released: 12/10/2008 Document Revised: 07/22/2015 Document Reviewed: 03/20/2014 Elsevier Interactive Patient Education  2017 [employer].

## 2020-11-10 NOTE — Progress Notes (Signed)
Virtual Visit via Telephone Note  I connected with  [name] on 11/10/20 at  2:30 PM EDT by telephone and verified that I am speaking with the correct person using two identifiers.  Medicare Annual Wellness visit completed telephonically due to Covid-19 pandemic.   Persons participating in this call: This Health Coach and this patient.   Location: Patient: home Provider: office   I discussed the limitations, risks, security and privacy concerns of performing an evaluation and management service by telephone and the availability of in person appointments. The patient expressed understanding and agreed to proceed.  Unable to perform video visit due to video visit attempted and failed and/or patient does not have video capability.   Some vital signs may be absent or patient reported.   Willette Brace, LPN   Subjective:   Billy [name] is a 50 y.o. male who presents for Medicare Annual/Subsequent preventive examination.  Review of Systems     Cardiac Risk Factors include: advanced age (>24mn, >>38women);male gender;obesity (BMI >30kg/m2)     Objective:    There were no vitals filed for this visit. There is no height or weight on file to calculate BMI.  Advanced Directives 11/10/2020 11/07/2019 05/01/2018 04/29/2018 03/22/2018 03/22/2018 12/16/2014  Does Patient Have a Medical Advance Directive? No No No No No No No  Does patient want to make changes to medical advance directive? - No - Patient declined - - - - -  Would patient like information on creating a medical advance directive? No - Patient declined - No - Patient declined No - Patient declined No - Patient declined - No - patient declined information    Current Medications (verified) Outpatient Encounter Medications as of 11/10/2020  Medication Sig   albuterol (VENTOLIN HFA) 108 (90 Base) MCG/ACT inhaler INHALE 1-2 PUFFS INTO THE LUNGS EVERY 6 (SIX) HOURS AS NEEDED FOR WHEEZING OR SHORTNESS OF BREATH.    aspirin 81 MG tablet Take 2 tablets (161 mg total) by mouth daily.   lisinopril (ZESTRIL) 20 MG tablet TAKE 1 TABLET BY MOUTH EVERY DAY   SYMBICORT 160-4.5 MCG/ACT inhaler TAKE 2 PUFFS BY MOUTH TWICE A DAY   tiotropium (SPIRIVA HANDIHALER) 18 MCG inhalation capsule PLACE 1 CAPSULE INTO INHALER AND INHALE DAILY.   [DISCONTINUED] methylPREDNISolone (MEDROL DOSEPAK) 4 MG TBPK tablet As directed   traMADol (ULTRAM) 50 MG tablet Take 2 tablets (100 mg total) by mouth every 6 (six) hours as needed. (Patient not taking: Reported on 11/10/2020)   No facility-administered encounter medications on file as of 11/10/2020.    Allergies (verified) Ativan [lorazepam]   History: Past Medical History:  Diagnosis Date   Allergy    as a child- none as adult    [place]    History of bronchitis    Hypertension    Obesity    Oxygen deficiency    Pt uses 02  3 liters with his CPAP ONLY    PFO (patent foramen ovale)    Polycythemia    a. Dr. EMarin Olp.Marland KitchenMarland KitchenAK-2 analysis is negative.  b. likely physiologic secondary to chronic hypoxia (? OSA + COPD + obesity).  c. s/p plebotomy x1   PVD (peripheral vascular disease) (HCC)    Sleep apnea    a. mod severe by sleep study 9.30.11: AHI 44.8 per hour; oxygen desat to a nadir of 65%; unsuccessful CPAP titration; significant hypoxemia even with supp o2 at 3LPM (dest to 70-80%); needs eval for cardiopulmonary disease and  upper airway obstruction   Substance abuse ([place])    alcoholism    Tobacco abuse    Past Surgical History:  Procedure Laterality Date   knee injury  [place]     as child    Family History  Problem Relation Age of Onset   Other Mother        Wegener's Disease   Lung cancer Paternal Grandmother    Drug abuse Father    Alcohol abuse Father    Asthma Father    Colon cancer Neg Hx    Colon polyps Neg Hx    Esophageal cancer Neg Hx    Rectal cancer Neg  Hx    Stomach cancer Neg Hx    Social History   Socioeconomic History   Marital status: Divorced    Spouse name: Not on file   Number of children: 2   Years of education: Not on file   Highest education level: Not on file  Occupational History   Occupation: unemployed  Tobacco Use   Smoking status: Former    Packs/day: 0.50    Years: 25.00    Pack years: 12.50    Types: Cigarettes    Quit date: 03/21/2018    Years since quitting: 2.6   Smokeless tobacco: Never  Vaping Use   Vaping Use: Former  Substance and Sexual Activity   Alcohol use: Yes    Alcohol/week: 76.0 standard drinks    Types: 36 Cans of beer, 40 Shots of liquor per week   Drug use: Yes    Frequency: 1.0 times per week    Types: Marijuana    Comment: marajuana...also valium not prescribed   Sexual activity: Not Currently  Other Topics Concern   Not on file  Social History Narrative   05/01/2018:   Lives with mother on one level   Has two 55yo sons who live nearby   Enjoys hunting/fishing   Since being hospitalized for COPD exacerbation, has quit smoking, and is motivated to do aerobic exercise   Heavy alcohol consumer, does not see it as a problem, no interest in reducing alcohol intake.   Social Determinants of Health   Financial Resource Strain: Low Risk    Difficulty of Paying Living Expenses: Not hard at all  Food Insecurity: No Food Insecurity   Worried About Charity fundraiser in the Last Year: Never true   [place] in the Last Year: Never true  Transportation Needs: No Transportation Needs   Lack of Transportation (Medical): No   Lack of Transportation (Non-Medical): No  Physical Activity: Insufficiently Active   Days of Exercise per Week: 2 days   Minutes of Exercise per Session: 30 min  Stress: No Stress Concern Present   Feeling of Stress : Only a little  Social Connections: Socially Isolated   Frequency of Communication with Friends and Family: More than three times a week    Frequency of Social Gatherings with Friends and Family: More than three times a week   Attends Religious Services: Never   Marine scientist or Organizations: No   Attends Music therapist: Never   Marital Status: Divorced    Tobacco Counseling Counseling given: Not Answered   Clinical Intake:  Pre-visit preparation completed: Yes  Pain : No/denies pain     BMI - recorded: 39.38 Nutritional Status: BMI > 30  Obese Nutritional Risks: None Diabetes: No  How often  do you need to have someone help you when you read instructions, pamphlets, or other written materials from your doctor or pharmacy?: 1 - Never  Diabetic?no  Interpreter Needed?: No  Information entered by :: Dayton Martes   Activities of Daily Living In your present state of health, do you have any difficulty performing the following activities: 11/10/2020  Hearing? N  Vision? N  Difficulty concentrating or making decisions? N  Walking or climbing stairs? N  Dressing or bathing? N  Doing errands, shopping? N  Preparing Food and eating ? N  Using the Toilet? N  In the past six months, have you accidently leaked urine? N  Do you have problems with loss of bowel control? N  Managing your Medications? N  Managing your Finances? N  Some recent data might be hidden    Patient Care Team: Dorothyann Peng, NP as PCP - General (Family Medicine) Collene Gobble, MD as Consulting Physician (Pulmonary Disease) Viona Gilmore, [facility] as Pharmacist (Pharmacist)  Indicate any recent Medical Services you may have received from other than [facility] providers in the past year (date may be approximate).     Assessment:   This is a routine wellness examination for [employer].  Hearing/Vision screen Hearing Screening - Comments:: Pt denies any hearing Vision Screening - Comments:: Encouraged to follow up   Dietary issues and exercise activities discussed:     Goals Addressed             This  Visit's Progress    Patient Stated       Lose weight       Depression Screen PHQ 2/9 Scores 11/10/2020 11/07/2019 05/01/2018 04/29/2018 06/09/2013 03/10/2013  PHQ - 2 Score 0 0 0 0 0 0  PHQ- 9 Score - 0 3 3 - -    Fall Risk Fall Risk  11/10/2020 11/07/2019 05/01/2018 04/29/2018 12/16/2014  Falls in the past year? 0 0 0 0 No  Number falls in past yr: 0 0 - - -  Injury with Fall? 0 0 - - -  Risk for fall due to : Impaired vision No Fall Risks - - -  Follow up Falls prevention discussed Falls evaluation completed;Falls prevention discussed - - -    FALL RISK PREVENTION PERTAINING TO THE HOME:  Any stairs in or around the home? Yes  If so, are there any without handrails? No  Home free of loose throw rugs in walkways, pet beds, electrical cords, etc? Yes  Adequate lighting in your home to reduce risk of falls? Yes   ASSISTIVE DEVICES UTILIZED TO PREVENT FALLS:  Life alert? No  Use of a cane, walker or w/c? No  Grab bars in the bathroom? No  Shower chair or bench in shower? No  Elevated toilet seat or a handicapped toilet? No   TIMED UP AND GO:  Was the test performed? No .   Cognitive Function:     6CIT Screen 11/10/2020  What Year? 0 points  What month? 0 points  What time? 0 points  Count back from 20 0 points  Months in reverse 0 points  Repeat phrase 2 points  Total Score 2    Immunizations There is no immunization history for the selected administration types on file for this patient.  TDAP status: Due, Education has been provided regarding the importance of this vaccine. Advised may receive this vaccine at local pharmacy or Health Dept. Aware to provide a copy of the vaccination record if obtained from  local pharmacy or Health Dept. Verbalized acceptance and understanding.    Pneumococcal vaccine status: Due, Education has been provided regarding the importance of this vaccine. Advised may receive this vaccine at local pharmacy or Health Dept. Aware to provide a copy of  the vaccination record if obtained from local pharmacy or Health Dept. Verbalized acceptance and understanding.  Covid-19 vaccine status: Information provided on how to obtain vaccines.   Qualifies for Shingles Vaccine? Yes   Zostavax completed No   Shingrix Completed?: No.    Education has been provided regarding the importance of this vaccine. Patient has been advised to call insurance company to determine out of pocket expense if they have not yet received this vaccine. Advised may also receive vaccine at local pharmacy or Health Dept. Verbalized acceptance and understanding.  Screening Tests Health Maintenance  Topic Date Due   COVID-19 Vaccine (1) Never done   Pneumococcal Vaccine 16-30 Years old (1 - PCV) Never done   TETANUS/TDAP  Never done   Zoster Vaccines- Shingrix (1 of 2) 01/22/2021 (Originally 08/16/1988)   COLONOSCOPY (Pts 45-26yr Insurance coverage will need to be confirmed)  10/08/2029   Hepatitis C Screening  Completed   HIV Screening  Completed   HPV VACCINES  Aged Out    Health Maintenance  Health Maintenance Due  Topic Date Due   COVID-19 Vaccine (1) Never done   Pneumococcal Vaccine 03163Years old (1 - PCV) Never done   TETANUS/TDAP  Never done    Colorectal cancer screening: Type of screening: Colonoscopy. Completed 10/09/19. Repeat every 10 years  Additional Screening:  Hepatitis C Screening: Completed 07/31/17  Vision Screening: Recommended annual ophthalmology exams for early detection of glaucoma and other disorders of the eye. Is the patient up to date with their annual eye exam?  No  Who is the provider or what is the name of the office in which the patient attends annual eye exams? Encouraged to follow up with provider If pt is not established with a provider, would they like to be referred to a provider to establish care? No .   Dental Screening: Recommended annual dental exams for proper oral hygiene  Community Resource Referral / Chronic Care  Management: CRR required this visit?  No   CCM required this visit?  No      Plan:     I have personally reviewed and noted the following in the patient's chart:   Medical and social history Use of alcohol, tobacco or illicit drugs  Current medications and supplements including opioid prescriptions. Patient is not currently taking opioid prescriptions. Functional ability and status Nutritional status Physical activity Advanced directives List of other physicians Hospitalizations, surgeries, and ER visits in previous 12 months Vitals Screenings to include cognitive, depression, and falls Referrals and appointments  In addition, I have reviewed and discussed with patient certain preventive protocols, quality metrics, and best practice recommendations. A written personalized care plan for preventive services as well as general preventive health recommendations were provided to patient.     TWillette Brace LPN   [id number]  Nurse Notes: one

## 2020-11-26 DIAGNOSIS — G4733 Obstructive sleep apnea (adult) (pediatric): Secondary | ICD-10-CM | POA: Diagnosis not present

## 2020-11-30 ENCOUNTER — Ambulatory Visit: Payer: Medicare Other | Admitting: Emergency Medicine

## 2020-11-30 ENCOUNTER — Other Ambulatory Visit: Payer: Self-pay

## 2020-11-30 ENCOUNTER — Encounter: Payer: Self-pay | Admitting: Emergency Medicine

## 2020-11-30 DIAGNOSIS — J449 Chronic obstructive pulmonary disease, unspecified: Secondary | ICD-10-CM | POA: Diagnosis not present

## 2020-11-30 DIAGNOSIS — G4733 Obstructive sleep apnea (adult) (pediatric): Secondary | ICD-10-CM | POA: Diagnosis not present

## 2020-11-30 MED ORDER — SPIRIVA RESPIMAT 1.25 MCG/ACT IN AERS: 2.0000 | INHALATION_SPRAY | Freq: Every day | RESPIRATORY_TRACT | 0 refills | Status: DC

## 2020-11-30 NOTE — Assessment & Plan Note (Signed)
Difficulty tolerating his CPAP.  He does still try to wear.  When he is unable he uses oxygen.  He is wondering about the inspire device, hypoglossal nerve stimulator.  I will refer him to sleep medicine to discuss it, find out the details.

## 2020-11-30 NOTE — Addendum Note (Signed)
Addended by: Gavin Potters R on: 11/30/2020 04:56 PM   Modules accepted: Orders

## 2020-11-30 NOTE — Assessment & Plan Note (Signed)
Continue Symbicort 2 puffs twice a day.  Rinse and gargle after using. We will try starting Spiriva Respimat, 2 sprays once daily.  Please call us and let us know if you benefit from this medication.  If so we will continue it and send a prescription to your pharmacy. Follow with Dr Lamonte Sakai in 6 months or sooner if you have any problems

## 2020-11-30 NOTE — Progress Notes (Signed)
Subjective:    Patient ID: Billy [name], male    DOB: [birth year], 50 y.o.   MRN: [id number] HPI 50 yo male with Severe COPD, severe OSA, pulmonary HTN, hypoxemia   ROV 06/23/20 --follow-up visit 50 year old obese gentleman with a history of OSA/OHS, severe COPD, history of hypoxemia and associated polycythemia.  He has been compliant with CPAP in the past but reports today that he has not been wearing it reliably, has trouble with the mask and does not feel that he needs it.  He does wear his oxygen at night while sleeping every night.  He is using Symbicort reliably. Occasionally will use Spiriva. He does not require albuterol frequently.  Overall his breathing has been better since he stopped smoking in 2020.   His wt has been stable for the last few years at 285 lbs. He walks some. Hasn't seen any desaturations. He does get some SOB with carrying objects. Occasional wheeze, no cough. No flares since last time.    ROV 11/30/20 --50 year old man with history of OSA/OHS that is been treated with CPAP therapy.  He also has severe COPD.  He has had hypoxemic respiratory failure with associated polycythemia although this has improved.  I last saw him in April 2022.  His CPAP compliance is poor.  Today he reports that he has heard some wheeze intermittently over the last 6 months. Has some UA mucous and cough. Has good exercise tolerance, no flares since 07/2019.  Has been on Symbicort, came off Spiriva.  Wt is up to 295 lbs from 185.  He is wearing CPAP occasionally, has leak and feels that the pressure is too high. Also causing dry mouth. Uses a full face mask. He is up frequently to urinate.                                                                                                 Objective:   Physical Exam  Vitals:   11/30/20 1540  BP: 128/76  Pulse: 85  Temp: 98.1 F (36.7 C)  TempSrc: Oral  SpO2: 94%  Weight: 295 lb (133.8 kg)  Height: 6\' 1"  (1.854 m)   Gen: Pleasant, obese, in  no distress,  normal affect  ENT: No lesions,  mouth clear,  oropharynx clear, no postnasal drip  Neck: No JVD, no stridor  Lungs: clear B, no wheezes  Cardiovascular: RRR, heart sounds normal, no murmur or gallops, no LE edema  Musculoskeletal: No deformities, no cyanosis or clubbing  Neuro: alert, non focal  Skin: some chronic venous stasis changes     Assessment & Plan:  Obstructive sleep apnea Difficulty tolerating his CPAP.  He does still try to wear.  When he is unable he uses oxygen.  He is wondering about the inspire device, hypoglossal nerve stimulator.  I will refer him to sleep medicine to discuss it, find out the details.    COPD (chronic obstructive pulmonary disease) (HCC) Continue Symbicort 2 puffs twice a day.  Rinse and gargle after using. We will try starting Spiriva Respimat, 2 sprays once daily.  Please call us and let  us know if you benefit from this medication.  If so we will continue it and send a prescription to your pharmacy. Follow with Dr Lamonte Sakai in 6 months or sooner if you have any problems  Baltazar Apo, MD, PhD 11/30/2020, 4:10 PM [facility] [phone] or if no answer [phone]

## 2020-11-30 NOTE — Patient Instructions (Addendum)
We will make a referral for you to one of our sleep medicine physicians to discuss possible benefits of hypoglossal nerve stimulator to treat your sleep apnea.  Continue try to use your CPAP as reliably as possible for now. Continue Symbicort 2 puffs twice a day.  Rinse and gargle after using. We will try starting Spiriva Respimat, 2 sprays once daily.  Please call us and let us know if you benefit from this medication.  If so we will continue it and send a prescription to your pharmacy. Continue your oxygen with significant exertion and bled into your CPAP, or at night when you are unable to wear your CPAP. Follow with Dr Lamonte Sakai in 6 months or sooner if you have any problems

## 2020-12-14 ENCOUNTER — Telehealth: Payer: Self-pay | Admitting: Pharmacist

## 2020-12-14 NOTE — Chronic Care Management (AMB) (Signed)
Chronic Care Management Pharmacy Assistant   Name: Billy [name]  MRN: [id number] DOB: February 15, 1971   Reason for Encounter: Disease State/ General Assessment Call.    Conditions to be addressed/monitored: HTN and COPD   Recent office visits:  07/22/20 Lelon Frohlich MD (PCP) - seen for bilateral leg edema. Discontinued fluconazole 150mg . Follow up with further concerns.   Recent consult visits:  11/30/20 Baltazar Apo MD (Pulmonology) - seen for obstructive sleep apnea. Patient started on tiotropium bromide monohydrate 1.40mcg 2 puffs daily. Follow up in 6 months.   Hospital visits:  None in previous 6 months  Medications: Outpatient Encounter Medications as of 12/14/2020  Medication Sig Note   albuterol (VENTOLIN HFA) 108 (90 Base) MCG/ACT inhaler INHALE 1-2 PUFFS INTO THE LUNGS EVERY 6 (SIX) HOURS AS NEEDED FOR WHEEZING OR SHORTNESS OF BREATH.    aspirin 81 MG tablet Take 2 tablets (161 mg total) by mouth daily.    lisinopril (ZESTRIL) 20 MG tablet TAKE 1 TABLET BY MOUTH EVERY DAY    SYMBICORT 160-4.5 MCG/ACT inhaler TAKE 2 PUFFS BY MOUTH TWICE A DAY    tiotropium (SPIRIVA HANDIHALER) 18 MCG inhalation capsule PLACE 1 CAPSULE INTO INHALER AND INHALE DAILY. 06/23/2020: Not using everyday    Tiotropium Bromide Monohydrate (SPIRIVA RESPIMAT) 1.25 MCG/ACT AERS Inhale 2 puffs into the lungs daily.    traMADol (ULTRAM) 50 MG tablet Take 2 tablets (100 mg total) by mouth every 6 (six) hours as needed.    No facility-administered encounter medications on file as of 12/14/2020.   Fill History:                     ALBUTEROL HFA 90 MCG INHALER 10/26/2020 20 SYMBICORT 160-4.5 MCG INHALER 11/22/2020 30   LISINOPRIL 20 MG TABLET 09/17/2020 90   Reviewed chart prior to disease state call. Spoke with patient regarding BP  Recent Office Vitals: BP Readings from Last 3 Encounters:  11/30/20 128/76  07/22/20 102/64  06/23/20 116/80   Pulse Readings from Last 3  Encounters:  11/30/20 85  07/22/20 88  06/23/20 82    Wt Readings from Last 3 Encounters:  11/30/20 295 lb (133.8 kg)  07/22/20 298 lb 8 oz (135.4 kg)  06/23/20 285 lb 12.8 oz (129.6 kg)     Kidney Function Lab Results  Component Value Date/Time   CREATININE 0.97 08/05/2019 10:15 AM   CREATININE 1.00 01/01/2019 04:08 PM   CREATININE 1.30 07/12/2018 04:02 PM   CREATININE 0.9 12/16/2014 02:07 PM   CREATININE 0.8 12/08/2013 08:40 AM   GFR 81.94 08/05/2019 10:15 AM   GFRNONAA >60 03/26/2018 03:15 AM   GFRAA >60 03/26/2018 03:15 AM    BMP Latest Ref Rng & Units 08/05/2019 01/01/2019 07/12/2018  Glucose 70 - 99 mg/dL 102(H) 106(H) 96  BUN 6 - 23 mg/dL 11 10 21   Creatinine 0.40 - 1.50 mg/dL 0.97 1.00 1.30  BUN/Creat Ratio 6 - 22 (calc) - - NOT APPLICABLE  Sodium 932 - 145 mEq/L 135 139 139  Potassium 3.5 - 5.1 mEq/L 4.6 4.2 4.4  Chloride 96 - 112 mEq/L 98 104 104  CO2 19 - 32 mEq/L 29 26 27   Calcium 8.4 - 10.5 mg/dL 9.6 9.5 9.6    Current antihypertensive regimen:  Lisinopril 20mg  - take 1 tablet by mouth everyday. How often are you checking your Blood Pressure?  Current home BP readings:  What recent interventions/DTPs have been made by any provider to improve Blood Pressure control  since last CPP Visit: None. Any recent hospitalizations or ED visits since last visit with CPP? No  Adherence Review: Is the patient currently on ACE/ARB medication? Yes Does the patient have >5 day gap between last estimated fill dates? No  Current COPD regimen:  Albuterol HFA, 2 puffs every 6 hours as needed for shortness of breath - have used 2 in 3 years - usually around 93 Spiriva Respimat 1.49mcg, 1 capsule into inhaler and inhaler daily (taking sporadically per recommendations by pulmonary) Duoneb, 1 vial via nebulizer every 6 hours as needed No flowsheet data found. Any recent hospitalizations or ED visits since last visit with CPP? No Denies COPD symptoms. What recent  interventions/DTPs have been made by any provider to improve breathing since last visit: Spiriva was changed from spiriva handihaler 18 mcg to spiriva respimat 1.9mcg. patient has stopped symbicort. Have you had exacerbation/flare-up since last visit?  What do you do when you are short of breath?    Respiratory Devices/Equipment Do you have a nebulizer? Yes Do you use a Peak Flow Meter? No Do you use a maintenance inhaler? Yes How often do you forget to use your daily inhaler?  Do you use a rescue inhaler? Yes How often do you use your rescue inhaler?   Do you use a spacer with your inhaler? No  Adherence Review: Does the patient have >5 day gap between last estimated fill date for maintenance inhaler medications? No   Multiple unsuccessful attempts to reach patient by phone.  Care Gaps:  AWV - scheduled for 11/23/21 Covid-19 vaccine - never done Tetanus/TDAP - never done  Star Rating Drugs:  Lisinopril 20mg  - last filled on 09/17/20 90DS at [facility] Pharmacist Assistant [phone]

## 2020-12-17 NOTE — Telephone Encounter (Cosign Needed)
2nd attwmpt

## 2020-12-21 NOTE — Telephone Encounter (Cosign Needed)
3rd attempt

## 2020-12-27 DIAGNOSIS — G4733 Obstructive sleep apnea (adult) (pediatric): Secondary | ICD-10-CM | POA: Diagnosis not present

## 2020-12-28 ENCOUNTER — Institutional Professional Consult (permissible substitution): Payer: Medicare Other | Admitting: Pulmonary Disease

## 2020-12-30 ENCOUNTER — Other Ambulatory Visit: Payer: Self-pay

## 2020-12-31 ENCOUNTER — Ambulatory Visit (INDEPENDENT_AMBULATORY_CARE_PROVIDER_SITE_OTHER): Payer: Medicare Other | Admitting: Adult Health

## 2020-12-31 ENCOUNTER — Encounter: Payer: Self-pay | Admitting: Adult Health

## 2020-12-31 ENCOUNTER — Encounter: Payer: Self-pay | Admitting: Pulmonary Disease

## 2020-12-31 ENCOUNTER — Institutional Professional Consult (permissible substitution): Payer: Medicare Other | Admitting: Pulmonary Disease

## 2020-12-31 ENCOUNTER — Ambulatory Visit (INDEPENDENT_AMBULATORY_CARE_PROVIDER_SITE_OTHER): Payer: Medicare Other | Admitting: Pulmonary Disease

## 2020-12-31 VITALS — BP 128/78 | HR 85 | Temp 97.8°F | Ht 73.0 in | Wt 298.0 lb

## 2020-12-31 VITALS — BP 138/60 | HR 92 | Temp 96.9°F | Ht 73.0 in | Wt 299.0 lb

## 2020-12-31 DIAGNOSIS — G8929 Other chronic pain: Secondary | ICD-10-CM

## 2020-12-31 DIAGNOSIS — J449 Chronic obstructive pulmonary disease, unspecified: Secondary | ICD-10-CM

## 2020-12-31 DIAGNOSIS — G4733 Obstructive sleep apnea (adult) (pediatric): Secondary | ICD-10-CM

## 2020-12-31 DIAGNOSIS — M25511 Pain in right shoulder: Secondary | ICD-10-CM | POA: Diagnosis not present

## 2020-12-31 MED ORDER — METHYLPREDNISOLONE ACETATE 80 MG/ML IJ SUSP
80.0000 mg | Freq: Once | INTRAMUSCULAR | Status: AC
  Administered 2020-12-31: 80 mg via INTRA_ARTICULAR

## 2020-12-31 NOTE — Patient Instructions (Signed)
Continue using CPAP  Call [employer] to have them walk you through how to to adjust the humidification  We will contact [employer] as well if you need supplies  I will see you in 6 to 8 weeks  Weight loss efforts as tolerated  Website for inspire -[email or web address]  Call us with significant concerns

## 2020-12-31 NOTE — Progress Notes (Signed)
Subjective:    Patient ID: Billy [name], male    DOB: [birth year], 50 y.o.   MRN: [id number]  HPI 50 year old male who  has a past medical history of Allergy, COPD (chronic obstructive pulmonary disease) ([place]), History of bronchitis, Hypertension, Obesity, Oxygen deficiency, PFO (patent foramen ovale), Polycythemia, PVD (peripheral vascular disease) ([place]), Sleep apnea, Substance abuse ([place]), and Tobacco abuse.  He presents to the office today for chronic right shoulder pain that has been becoming progressively worse over the last year, especially with certain movements.   Review of Systems See HPI   Past Medical History:  Diagnosis Date   Allergy    as a child- none as adult    COPD (chronic obstructive pulmonary disease) ([place])    History of bronchitis    Hypertension    Obesity    Oxygen deficiency    Pt uses 02  3 liters with his CPAP ONLY    PFO (patent foramen ovale)    Polycythemia    a. Dr. Marin Olp.Marland KitchenMarland KitchenJAK-2 analysis is negative.  b. likely physiologic secondary to chronic hypoxia (? OSA + COPD + obesity).  c. s/p plebotomy x1   PVD (peripheral vascular disease) ([facility])    Sleep apnea    a. mod severe by sleep study 9.30.11: AHI 44.8 per hour; oxygen desat to a nadir of 65%; unsuccessful CPAP titration; significant hypoxemia even with supp o2 at 3LPM (dest to 70-80%); needs eval for cardiopulmonary disease and upper airway obstruction   Substance abuse ([place])    alcoholism    Tobacco abuse     Social History   Socioeconomic History   Marital status: Divorced    Spouse name: Not on file   Number of children: 2   Years of education: Not on file   Highest education level: Not on file  Occupational History   Occupation: unemployed  Tobacco Use   Smoking status: Former    Packs/day: 0.50    Years: 25.00    Pack years: 12.50    Types: Cigarettes    Quit date: 03/21/2018    Years since quitting: 2.7   Smokeless tobacco: Never  Vaping Use   Vaping Use: Former   Substance and Sexual Activity   Alcohol use: Yes    Alcohol/week: 76.0 standard drinks    Types: 36 Cans of beer, 40 Shots of liquor per week   Drug use: Yes    Frequency: 1.0 times per week    Types: Marijuana    Comment: marajuana...also valium not prescribed   Sexual activity: Not Currently  Other Topics Concern   Not on file  Social History Narrative   05/01/2018:   Lives with mother on one level   Has two 22yo sons who live nearby   Enjoys hunting/fishing   Since being hospitalized for COPD exacerbation, has quit smoking, and is motivated to do aerobic exercise   Heavy alcohol consumer, does not see it as a problem, no interest in reducing alcohol intake.   Social Determinants of Health   Financial Resource Strain: Low Risk    Difficulty of Paying Living Expenses: Not hard at all  Food Insecurity: No Food Insecurity   Worried About Charity fundraiser in the Last Year: Never true   [place] in the Last Year: Never true  Transportation Needs: No Transportation Needs   Lack of Transportation (Medical): No   Lack of Transportation (Non-Medical): No  Physical Activity: Insufficiently Active   Days  of Exercise per Week: 2 days   Minutes of Exercise per Session: 30 min  Stress: No Stress Concern Present   Feeling of Stress : Only a little  Social Connections: Socially Isolated   Frequency of Communication with Friends and Family: More than three times a week   Frequency of Social Gatherings with Friends and Family: More than three times a week   Attends Religious Services: Never   Marine scientist or Organizations: No   Attends Music therapist: Never   Marital Status: Divorced  Human resources officer Violence: Not At Risk   Fear of Current or Ex-Partner: No   Emotionally Abused: No   Physically Abused: No   Sexually Abused: No    Past Surgical History:  Procedure Laterality Date   knee injury  [place]     as child     Family History  Problem Relation Age of Onset   Other Mother        Wegener's Disease   Lung cancer Paternal Grandmother    Drug abuse Father    Alcohol abuse Father    Asthma Father    Colon cancer Neg Hx    Colon polyps Neg Hx    Esophageal cancer Neg Hx    Rectal cancer Neg Hx    Stomach cancer Neg Hx     Allergies  Allergen Reactions   Ativan [Lorazepam] Other (See Comments)    Pt given Ativan.  Pt became unresponsive.    Current Outpatient Medications on File Prior to Visit  Medication Sig Dispense Refill   albuterol (VENTOLIN HFA) 108 (90 Base) MCG/ACT inhaler INHALE 1-2 PUFFS INTO THE LUNGS EVERY 6 (SIX) HOURS AS NEEDED FOR WHEEZING OR SHORTNESS OF BREATH. 8.5 each 4   aspirin 81 MG tablet Take 2 tablets (161 mg total) by mouth daily. 30 tablet 0   lisinopril (ZESTRIL) 20 MG tablet TAKE 1 TABLET BY MOUTH EVERY DAY 90 tablet 3   SYMBICORT 160-4.5 MCG/ACT inhaler TAKE 2 PUFFS BY MOUTH TWICE A DAY 10.2 each 11   tiotropium (SPIRIVA HANDIHALER) 18 MCG inhalation capsule PLACE 1 CAPSULE INTO INHALER AND INHALE DAILY. 30 capsule 5   traMADol (ULTRAM) 50 MG tablet Take 2 tablets (100 mg total) by mouth every 6 (six) hours as needed. 60 tablet 0   No current facility-administered medications on file prior to visit.    BP 138/60   Pulse 92   Temp (!) 96.9 F (36.1 C) (Oral)   Ht 6\' 1"  (1.854 m)   Wt 299 lb (135.6 kg)   SpO2 96%   BMI 39.45 kg/m       Objective:   Physical Exam Vitals and nursing note reviewed.  Constitutional:      Appearance: Normal appearance.  Musculoskeletal:     Right shoulder: Tenderness present. No swelling, laceration, bony tenderness or crepitus. Decreased range of motion. Normal strength.     Comments: Positive empty can test and back scratch test   Skin:    General: Skin is warm and dry.  Neurological:     General: No focal deficit present.     Mental Status: He is alert and oriented to person, place,  and time. Mental status is at baseline.      Assessment & Plan:  1. Chronic right shoulder pain - Discussed option for shoulder pain including steroid injection and physical therapy. He opted for steroid injection. Pain likely  rotator cuff impingement - methylPREDNISolone acetate (DEPO-MEDROL) injection 80 mg  Shoulder injection Verbal consent obtained and verified. Sterile betadine prep. Furthur cleansed with alcohol. Topical analgesic spray: Ethyl chloride. Joint: right  subacromial injection Approached in typical fashion with: posterior approach Completed without difficulty Meds: 3 cc lidocaine 2% no epi, 1 cc depomedrol 80mg /cc Needle:1.5 inch 25 gauge Aftercare instructions and Red flags advised. Immediate improvement in pain noted   Dorothyann Peng, NP

## 2020-12-31 NOTE — Progress Notes (Signed)
Billy [name]    [id number]    [birth year]  Primary Care Physician:Nafziger, Tommi Rumps, NP  Referring Physician: Dorothyann Peng, NP [address],  [place] [address]  Chief complaint:   Patient with a history of obstructive sleep apnea, chronic obstructive pulmonary disease  HPI:  Has been having difficulty tolerating CPAP Came in today -Discussed an inspire device-his weight will preclude using an inspire device, I did relay to him that I can send him for an evaluation, he will not be a candidate at present  Pathophysiology of sleep disordered breathing discussed with the patient  The lack of significant benefit with using just oxygen supplementation if he has significant sleep apnea discussed  Usually goes to bed about 9 30-12 Falls asleep easily about 3 awakening Final wake up time about 6:30 in the morning  Does have dryness of his mouth in the mornings  Just finds CPAP a nuisance and a agrees to give it another shot  Quit smoking about 3 years ago He is known to have chronic obstructive pulmonary disease -Compliant with inhalers  Outpatient Encounter Medications as of 12/31/2020  Medication Sig   albuterol (VENTOLIN HFA) 108 (90 Base) MCG/ACT inhaler INHALE 1-2 PUFFS INTO THE LUNGS EVERY 6 (SIX) HOURS AS NEEDED FOR WHEEZING OR SHORTNESS OF BREATH.   aspirin 81 MG tablet Take 2 tablets (161 mg total) by mouth daily.   lisinopril (ZESTRIL) 20 MG tablet TAKE 1 TABLET BY MOUTH EVERY DAY   SYMBICORT 160-4.5 MCG/ACT inhaler TAKE 2 PUFFS BY MOUTH TWICE A DAY   tiotropium (SPIRIVA HANDIHALER) 18 MCG inhalation capsule PLACE 1 CAPSULE INTO INHALER AND INHALE DAILY.   traMADol (ULTRAM) 50 MG tablet Take 2 tablets (100 mg total) by mouth every 6 (six) hours as needed.   [DISCONTINUED] Tiotropium Bromide Monohydrate (SPIRIVA RESPIMAT) 1.25 MCG/ACT AERS Inhale 2 puffs into the lungs daily. (Patient not taking: Reported on 12/31/2020)   No  facility-administered encounter medications on file as of 12/31/2020.    Allergies as of 12/31/2020 - Review Complete 12/31/2020  Allergen Reaction Noted   Ativan [lorazepam] Other (See Comments) 04/29/2018    Past Medical History:  Diagnosis Date   Allergy    as a child- none as adult    COPD (chronic obstructive pulmonary disease) ([place])    History of bronchitis    Hypertension    Obesity    Oxygen deficiency    Pt uses 02  3 liters with his CPAP ONLY    PFO (patent foramen ovale)    Polycythemia    a. Dr. Marin Olp.Marland KitchenMarland KitchenJAK-2 analysis is negative.  b. likely physiologic secondary to chronic hypoxia (? OSA + COPD + obesity).  c. s/p plebotomy x1   PVD (peripheral vascular disease) ([facility])    Sleep apnea    a. mod severe by sleep study 9.30.11: AHI 44.8 per hour; oxygen desat to a nadir of 65%; unsuccessful CPAP titration; significant hypoxemia even with supp o2 at 3LPM (dest to 70-80%); needs eval for cardiopulmonary disease and upper airway obstruction   Substance abuse ([place])    alcoholism    Tobacco abuse     Past Surgical History:  Procedure Laterality Date   knee injury  [place]     as child     Family History  Problem Relation Age of Onset   Other Mother        Wegener's Disease  Lung cancer Paternal Grandmother    Drug abuse Father    Alcohol abuse Father    Asthma Father    Colon cancer Neg Hx    Colon polyps Neg Hx    Esophageal cancer Neg Hx    Rectal cancer Neg Hx    Stomach cancer Neg Hx     Social History   Socioeconomic History   Marital status: Divorced    Spouse name: Not on file   Number of children: 2   Years of education: Not on file   Highest education level: Not on file  Occupational History   Occupation: unemployed  Tobacco Use   Smoking status: Former    Packs/day: 0.50    Years: 25.00    Pack years: 12.50    Types: Cigarettes    Quit date: 03/21/2018    Years since quitting: 2.7    Smokeless tobacco: Never  Vaping Use   Vaping Use: Former  Substance and Sexual Activity   Alcohol use: Yes    Alcohol/week: 76.0 standard drinks    Types: 36 Cans of beer, 40 Shots of liquor per week   Drug use: Yes    Frequency: 1.0 times per week    Types: Marijuana    Comment: marajuana...also valium not prescribed   Sexual activity: Not Currently  Other Topics Concern   Not on file  Social History Narrative   05/01/2018:   Lives with mother on one level   Has two 58yo sons who live nearby   Enjoys hunting/fishing   Since being hospitalized for COPD exacerbation, has quit smoking, and is motivated to do aerobic exercise   Heavy alcohol consumer, does not see it as a problem, no interest in reducing alcohol intake.   Social Determinants of Health   Financial Resource Strain: Low Risk    Difficulty of Paying Living Expenses: Not hard at all  Food Insecurity: No Food Insecurity   Worried About Charity fundraiser in the Last Year: Never true   [place] in the Last Year: Never true  Transportation Needs: No Transportation Needs   Lack of Transportation (Medical): No   Lack of Transportation (Non-Medical): No  Physical Activity: Insufficiently Active   Days of Exercise per Week: 2 days   Minutes of Exercise per Session: 30 min  Stress: No Stress Concern Present   Feeling of Stress : Only a little  Social Connections: Socially Isolated   Frequency of Communication with Friends and Family: More than three times a week   Frequency of Social Gatherings with Friends and Family: More than three times a week   Attends Religious Services: Never   Marine scientist or Organizations: No   Attends Music therapist: Never   Marital Status: Divorced  Human resources officer Violence: Not At Risk   Fear of Current or Ex-Partner: No   Emotionally Abused: No   Physically Abused: No   Sexually Abused: No    Review of Systems  Constitutional:  Negative for fatigue.   Respiratory:  Positive for apnea. Negative for shortness of breath.   Psychiatric/Behavioral:  Positive for sleep disturbance.    Vitals:   12/31/20 1126  BP: 128/78  Pulse: 85  Temp: 97.8 F (36.6 C)  SpO2: 95%     Physical Exam Constitutional:      Appearance: He is obese.  HENT:     Head: Normocephalic.     Mouth/Throat:     Mouth: Mucous membranes  are moist.     Comments: Mallampati 4, crowded oropharynx, macroglossia Cardiovascular:     Heart sounds: No murmur heard.   No friction rub.  Pulmonary:     Effort: No respiratory distress.     Breath sounds: No stridor. No wheezing or rhonchi.  Musculoskeletal:     Cervical back: No rigidity or tenderness.  Neurological:     Mental Status: He is alert.  Psychiatric:        Mood and Affect: Mood normal.   Results of the Epworth flowsheet 12/31/2020  Sitting and reading 2  Watching TV 2  Sitting, inactive in a public place (e.g. a theatre or a meeting) 1  As a passenger in a car for an hour without a break 1  Lying down to rest in the afternoon when circumstances permit 2  Sitting and talking to someone 0  Sitting quietly after a lunch without alcohol 1  In a car, while stopped for a few minutes in traffic 0  Total score 9     Data Reviewed: We do not have his old sleep study No compliance data as he has not been using the machine  Assessment:  Patient with a history of obstructive sleep apnea -He continues to have symptoms suggesting significant presence of sleep disordered breathing  He has chronic obstructive pulmonary disease  Extensive discussions about inspire device -I did offer him a referral to ENT to discuss this  He will try to use CPAP at present  Plan/Recommendations: Encouraged to use CPAP  Contact DME company regarding supplies  Machine adjustments as needed  Weight loss efforts encouraged  I will see him back in 6 to 8 weeks with compliance details   Sherrilyn Rist MD [facility] 12/31/2020, 11:54 AM  CC: Dorothyann Peng, NP

## 2021-01-05 ENCOUNTER — Telehealth: Payer: Self-pay | Admitting: Pharmacist

## 2021-01-05 NOTE — Chronic Care Management (AMB) (Signed)
Chronic Care Management Pharmacy Assistant   Name: Billy [name]  MRN: [id number] DOB: [birth year]  Reason for Encounter: Disease State /Hypertension and COPD assessment call   Conditions to be addressed/monitored: HTN and COPD  Recent office visits:  12/31/2020 Dorothyann Peng NP (PCP) - Patient was seen for chronic right shoulder pain. No medication changes. No follow up noted.  Recent consult visits:  12/31/2020 Sherrilyn Rist MD (pulmonary disease) - Patient was seen for obstructive sleep apnea and an additional issue. Discontinued Spiriva Handihaler. Follow up in 6-8 weeks.  Hospital visits:  None  Medications: Outpatient Encounter Medications as of 01/05/2021  Medication Sig Note   albuterol (VENTOLIN HFA) 108 (90 Base) MCG/ACT inhaler INHALE 1-2 PUFFS INTO THE LUNGS EVERY 6 (SIX) HOURS AS NEEDED FOR WHEEZING OR SHORTNESS OF BREATH.    aspirin 81 MG tablet Take 2 tablets (161 mg total) by mouth daily.    lisinopril (ZESTRIL) 20 MG tablet TAKE 1 TABLET BY MOUTH EVERY DAY    SYMBICORT 160-4.5 MCG/ACT inhaler TAKE 2 PUFFS BY MOUTH TWICE A DAY    tiotropium (SPIRIVA HANDIHALER) 18 MCG inhalation capsule PLACE 1 CAPSULE INTO INHALER AND INHALE DAILY. 06/23/2020: Not using everyday    traMADol (ULTRAM) 50 MG tablet Take 2 tablets (100 mg total) by mouth every 6 (six) hours as needed.    No facility-administered encounter medications on file as of 01/05/2021.   Fill History: ALBUTEROL HFA 90 MCG INHALER 10/26/2020 20   LISINOPRIL 20 MG TABLET 12/10/2020 90   SYMBICORT 160-4.5 MCG INHALER 11/22/2020 30    Current COPD regimen: Ventolin HFA 108 mcg  as needed and Symbicort 160/4.5 mcg/ACT 2 puffs twice daily.  No flowsheet data found.  Any recent hospitalizations or ED visits since last visit with CPP? No  Reports denies COPD symptoms, patient states he quit smoking 3 years ago and his symptoms have drastically improved.  What recent interventions/DTPs have been  made by any provider to improve breathing since last visit: None  Weight loss efforts as tolerated   Have you had exacerbation/flare-up since last visit? No  What do you do when you are short of breath?  Rescue medication  Respiratory Devices/Equipment Do you have a nebulizer? Yes Do you use a Peak Flow Meter? No Do you use a maintenance inhaler? Yes How often do you forget to use your daily inhaler? Never Do you use a rescue inhaler? Yes How often do you use your rescue inhaler?  1-2x per week Do you use a spacer with your inhaler? No  Adherence Review: Does the patient have >5 day gap between last estimated fill date for maintenance inhaler medications? No  Reviewed chart prior to disease state call. Spoke with patient regarding BP  Recent Office Vitals: BP Readings from Last 3 Encounters:  12/31/20 138/60  12/31/20 128/78  11/30/20 128/76   Pulse Readings from Last 3 Encounters:  12/31/20 92  12/31/20 85  11/30/20 85    Wt Readings from Last 3 Encounters:  12/31/20 299 lb (135.6 kg)  12/31/20 298 lb (135.2 kg)  11/30/20 295 lb (133.8 kg)     Kidney Function Lab Results  Component Value Date/Time   CREATININE 0.97 08/05/2019 10:15 AM   CREATININE 1.00 01/01/2019 04:08 PM   CREATININE 1.30 07/12/2018 04:02 PM   CREATININE 0.9 12/16/2014 02:07 PM   CREATININE 0.8 12/08/2013 08:40 AM   GFR 81.94 08/05/2019 10:15 AM   GFRNONAA >60 03/26/2018 03:15 AM   GFRAA >60 03/26/2018  03:15 AM    BMP Latest Ref Rng & Units 08/05/2019 01/01/2019 07/12/2018  Glucose 70 - 99 mg/dL 102(H) 106(H) 96  BUN 6 - 23 mg/dL 11 10 21   Creatinine 0.40 - 1.50 mg/dL 0.97 1.00 1.30  BUN/Creat Ratio 6 - 22 (calc) - - NOT APPLICABLE  Sodium 277 - 145 mEq/L 135 139 139  Potassium 3.5 - 5.1 mEq/L 4.6 4.2 4.4  Chloride 96 - 112 mEq/L 98 104 104  CO2 19 - 32 mEq/L 29 26 27   Calcium 8.4 - 10.5 mg/dL 9.6 9.5 9.6    Current antihypertensive regimen:  Lisinopril 20 mg daily  How often are you  checking your Blood Pressure? infrequently  Current home BP readings: None, is has been over 6 months since last home reading  What recent interventions/DTPs have been made by any provider to improve Blood Pressure control since last CPP Visit: None  Any recent hospitalizations or ED visits since last visit with CPP? No  What diet changes have been made to improve Blood Pressure Control?  None, for breakfast he will have a cookie bar or biscuit and cook on the weekend, for lunch he eats a variety, salad sandwich and for dinner he eats out what he feels like, he does not eat fast food.   What exercise is being done to improve your Blood Pressure Control?  Walking daily and Golf  Adherence Review: Is the patient currently on ACE/ARB medication? Yes Does the patient have >5 day gap between last estimated fill dates? No   Care Gaps: AWV - completed 11/10/2020 Last BP - 138/60 on 12/31/2020 Covid-19 vaccine - never done Tetanus/TDAP - never done Pneumovax - never done  Star Rating Drugs: Lisinopril 20 mg - last filled 12/10/2020 90 DS at [place] Pharmacist Assistant [phone]

## 2021-01-06 ENCOUNTER — Telehealth: Payer: Self-pay | Admitting: Adult Health

## 2021-01-06 NOTE — Telephone Encounter (Signed)
FYI

## 2021-01-06 NOTE — Telephone Encounter (Signed)
Patient called to let Tommi Rumps know that the cortisone shot did not help shoulder, patient feels like he would have to get another one to try again as previously discussed. Patient is scheduled for tomorrow.

## 2021-01-07 ENCOUNTER — Ambulatory Visit (INDEPENDENT_AMBULATORY_CARE_PROVIDER_SITE_OTHER): Payer: Medicare Other | Admitting: Adult Health

## 2021-01-07 ENCOUNTER — Ambulatory Visit (INDEPENDENT_AMBULATORY_CARE_PROVIDER_SITE_OTHER): Payer: Medicare Other

## 2021-01-07 ENCOUNTER — Encounter: Payer: Self-pay | Admitting: Adult Health

## 2021-01-07 ENCOUNTER — Other Ambulatory Visit: Payer: Self-pay

## 2021-01-07 VITALS — BP 90/70 | HR 72 | Temp 98.2°F | Ht 73.0 in | Wt 296.0 lb

## 2021-01-07 DIAGNOSIS — G8929 Other chronic pain: Secondary | ICD-10-CM | POA: Diagnosis not present

## 2021-01-07 DIAGNOSIS — M25511 Pain in right shoulder: Secondary | ICD-10-CM

## 2021-01-07 MED ORDER — METHYLPREDNISOLONE ACETATE 80 MG/ML IJ SUSP
80.0000 mg | Freq: Once | INTRAMUSCULAR | Status: AC
  Administered 2021-01-07: 80 mg via INTRA_ARTICULAR

## 2021-01-07 NOTE — Progress Notes (Signed)
Subjective:    Patient ID: Billy [name], male    DOB: 1970-07-28, 50 y.o.   MRN: [id number]  HPI  50 year old male who  has a past medical history of Allergy, COPD (chronic obstructive pulmonary disease) ([place]), History of bronchitis, Hypertension, Obesity, Oxygen deficiency, PFO (patent foramen ovale), Polycythemia, PVD (peripheral vascular disease) ([place]), Sleep apnea, Substance abuse ([place]), and Tobacco abuse.  He presents to the office today for follow-up regarding chronic right shoulder pain.  He was seen about 10 days ago for progressively worse shoulder pain over the previous year.  This time we discussed options and ultimately decided on a steroid injection.  His steroid injection was 80 mg.  Today he reports he had some mild relief for a day or two but then the pain started to come back.   Review of Systems See HPI   Past Medical History:  Diagnosis Date   Allergy    as a child- none as adult    COPD (chronic obstructive pulmonary disease) ([place])    History of bronchitis    Hypertension    Obesity    Oxygen deficiency    Pt uses 02  3 liters with his CPAP ONLY    PFO (patent foramen ovale)    Polycythemia    a. Dr. Marin Olp.Marland KitchenMarland KitchenJAK-2 analysis is negative.  b. likely physiologic secondary to chronic hypoxia (? OSA + COPD + obesity).  c. s/p plebotomy x1   PVD (peripheral vascular disease) ([facility])    Sleep apnea    a. mod severe by sleep study 9.30.11: AHI 44.8 per hour; oxygen desat to a nadir of 65%; unsuccessful CPAP titration; significant hypoxemia even with supp o2 at 3LPM (dest to 70-80%); needs eval for cardiopulmonary disease and upper airway obstruction   Substance abuse ([place])    alcoholism    Tobacco abuse     Social History   Socioeconomic History   Marital status: Divorced    Spouse name: Not on file   Number of children: 2   Years of education: Not on file   Highest education level: Not on file  Occupational History   Occupation: unemployed  Tobacco  Use   Smoking status: Former    Packs/day: 0.50    Years: 25.00    Pack years: 12.50    Types: Cigarettes    Quit date: 03/21/2018    Years since quitting: 2.8   Smokeless tobacco: Never  Vaping Use   Vaping Use: Former  Substance and Sexual Activity   Alcohol use: Yes    Alcohol/week: 76.0 standard drinks    Types: 36 Cans of beer, 40 Shots of liquor per week   Drug use: Yes    Frequency: 1.0 times per week    Types: Marijuana    Comment: marajuana...also valium not prescribed   Sexual activity: Not Currently  Other Topics Concern   Not on file  Social History Narrative   05/01/2018:   Lives with mother on one level   Has two 75yo sons who live nearby   Enjoys hunting/fishing   Since being hospitalized for COPD exacerbation, has quit smoking, and is motivated to do aerobic exercise   Heavy alcohol consumer, does not see it as a problem, no interest in reducing alcohol intake.   Social Determinants of Health   Financial Resource Strain: Low Risk    Difficulty of Paying Living Expenses: Not hard at all  Food Insecurity: No Food Insecurity   Worried About Running Out  of Food in the Last Year: Never true   [place] in the Last Year: Never true  Transportation Needs: No Transportation Needs   Lack of Transportation (Medical): No   Lack of Transportation (Non-Medical): No  Physical Activity: Insufficiently Active   Days of Exercise per Week: 2 days   Minutes of Exercise per Session: 30 min  Stress: No Stress Concern Present   Feeling of Stress : Only a little  Social Connections: Socially Isolated   Frequency of Communication with Friends and Family: More than three times a week   Frequency of Social Gatherings with Friends and Family: More than three times a week   Attends Religious Services: Never   Marine scientist or Organizations: No   Attends Music therapist: Never   Marital Status: Divorced  Human resources officer Violence: Not At Risk   Fear  of Current or Ex-Partner: No   Emotionally Abused: No   Physically Abused: No   Sexually Abused: No    Past Surgical History:  Procedure Laterality Date   knee injury  [place]     as child     Family History  Problem Relation Age of Onset   Other Mother        Wegener's Disease   Lung cancer Paternal Grandmother    Drug abuse Father    Alcohol abuse Father    Asthma Father    Colon cancer Neg Hx    Colon polyps Neg Hx    Esophageal cancer Neg Hx    Rectal cancer Neg Hx    Stomach cancer Neg Hx     Allergies  Allergen Reactions   Ativan [Lorazepam] Other (See Comments)    Pt given Ativan.  Pt became unresponsive.    Current Outpatient Medications on File Prior to Visit  Medication Sig Dispense Refill   albuterol (VENTOLIN HFA) 108 (90 Base) MCG/ACT inhaler INHALE 1-2 PUFFS INTO THE LUNGS EVERY 6 (SIX) HOURS AS NEEDED FOR WHEEZING OR SHORTNESS OF BREATH. 8.5 each 4   aspirin 81 MG tablet Take 2 tablets (161 mg total) by mouth daily. 30 tablet 0   lisinopril (ZESTRIL) 20 MG tablet TAKE 1 TABLET BY MOUTH EVERY DAY 90 tablet 3   SYMBICORT 160-4.5 MCG/ACT inhaler TAKE 2 PUFFS BY MOUTH TWICE A DAY 10.2 each 11   tiotropium (SPIRIVA HANDIHALER) 18 MCG inhalation capsule PLACE 1 CAPSULE INTO INHALER AND INHALE DAILY. 30 capsule 5   traMADol (ULTRAM) 50 MG tablet Take 2 tablets (100 mg total) by mouth every 6 (six) hours as needed. 60 tablet 0   No current facility-administered medications on file prior to visit.    BP 90/70   Pulse 72   Temp 98.2 F (36.8 C) (Oral)   Ht 6\' 1"  (1.854 m)   Wt 296 lb (134.3 kg)   SpO2 (!) 88%   BMI 39.05 kg/m       Objective:   Physical Exam Vitals and nursing note reviewed.  Constitutional:      Appearance: Normal appearance. He is obese.  Musculoskeletal:     Right shoulder: Bony tenderness present. No swelling, tenderness or crepitus. Decreased range of motion. Normal strength.  Normal pulse.  Skin:    General: Skin is warm and dry.     Capillary Refill: Capillary refill takes less than 2 seconds.  Neurological:     General: No focal deficit present.  Mental Status: He is alert and oriented to person, place, and time.  Psychiatric:        Mood and Affect: Mood normal.        Behavior: Behavior normal.        Thought Content: Thought content normal.        Judgment: Judgment normal.       Assessment & Plan:  1. Chronic right shoulder pain We decided to inject him again with a steroid injection to see if we could get more relief. Will also do xray today. Will follow up with him next week to see if he is having any improvement. If not then order MRI   Shoulder injection Verbal consent obtained and verified. Sterile betadine prep. Furthur cleansed with alcohol. Topical analgesic spray: Ethyl chloride. Joint: right subacromial injection Approached in typical fashion with: posterior approach Completed without difficulty Meds: 3 cc lidocaine 2% no epi, 1 cc depomedrol 80mg /cc Needle:1.5 inch 25 gauge Aftercare instructions and Red flags advised. Immediate improvement in pain noted  - DG Shoulder Right; Future - methylPREDNISolone acetate (DEPO-MEDROL) injection 80 mg  Dorothyann Peng, NP

## 2021-01-11 ENCOUNTER — Telehealth: Payer: Self-pay | Admitting: Adult Health

## 2021-01-11 DIAGNOSIS — G8929 Other chronic pain: Secondary | ICD-10-CM

## 2021-01-11 NOTE — Telephone Encounter (Signed)
Updated patient on xray of right shoulder. He reports that pain is no better after steroid injection.   Will order MRI

## 2021-01-26 DIAGNOSIS — G4733 Obstructive sleep apnea (adult) (pediatric): Secondary | ICD-10-CM | POA: Diagnosis not present

## 2021-03-02 ENCOUNTER — Other Ambulatory Visit: Payer: Self-pay | Admitting: Adult Health

## 2021-03-03 ENCOUNTER — Telehealth: Payer: Self-pay | Admitting: Pulmonary Disease

## 2021-03-03 MED ORDER — ALBUTEROL SULFATE HFA 108 (90 BASE) MCG/ACT IN AERS: 1.0000 | INHALATION_SPRAY | Freq: Four times a day (QID) | RESPIRATORY_TRACT | 4 refills | Status: DC | PRN

## 2021-03-03 NOTE — Telephone Encounter (Signed)
Spoke with the pt and notified that I have sent his albuterol inhaler to the pharm. Nothing further needed.

## 2021-03-04 ENCOUNTER — Other Ambulatory Visit: Payer: Self-pay

## 2021-03-04 ENCOUNTER — Encounter: Payer: Self-pay | Admitting: Pulmonary Disease

## 2021-03-04 ENCOUNTER — Ambulatory Visit: Payer: Medicare Other | Admitting: Pulmonary Disease

## 2021-03-04 VITALS — BP 120/78 | HR 86 | Temp 98.6°F | Ht 73.0 in | Wt 302.0 lb

## 2021-03-04 DIAGNOSIS — G4733 Obstructive sleep apnea (adult) (pediatric): Secondary | ICD-10-CM | POA: Diagnosis not present

## 2021-03-04 DIAGNOSIS — Z9989 Dependence on other enabling machines and devices: Secondary | ICD-10-CM

## 2021-03-04 NOTE — Patient Instructions (Signed)
Continue to attempt to using CPAP on a regular basis  Continue weight loss efforts  Called medical supply company to assist with mask  I will see you back in about 6 months  Call us with any significant concerns

## 2021-03-04 NOTE — Progress Notes (Signed)
Billy [name]    [id number]    11/24/1970  Primary Care Physician:Nafziger, Tommi Rumps, NP  Referring Physician: Dorothyann Peng, NP [address] [place],  [place] [address]  Chief complaint:   Patient with a history of obstructive sleep apnea, chronic obstructive pulmonary disease  HPI:  Continues to have some difficulty tolerating CPAP Tries to use it nightly believes his mouth very dry  Currently uses a full facemask  He was tolerating CPAP well in the past until he got away from using it but having difficulty at present  Discussed inspire device during his last visit-his weight will preclude this  Pathophysiology of sleep disordered breathing discussed with the patient  Usually goes to bed about 9 30-12 Falls asleep easily about 3 awakening Final wake up time about 6:30 in the morning  Quit smoking about 3 years ago He is known to have chronic obstructive pulmonary disease -Compliant with inhalers  Outpatient Encounter Medications as of 03/04/2021  Medication Sig   albuterol (VENTOLIN HFA) 108 (90 Base) MCG/ACT inhaler Inhale 1-2 puffs into the lungs every 6 (six) hours as needed for wheezing or shortness of breath.   aspirin 81 MG tablet Take 2 tablets (161 mg total) by mouth daily.   lisinopril (ZESTRIL) 20 MG tablet TAKE 1 TABLET BY MOUTH EVERY DAY   SYMBICORT 160-4.5 MCG/ACT inhaler TAKE 2 PUFFS BY MOUTH TWICE A DAY   tiotropium (SPIRIVA HANDIHALER) 18 MCG inhalation capsule PLACE 1 CAPSULE INTO INHALER AND INHALE DAILY.   traMADol (ULTRAM) 50 MG tablet Take 2 tablets (100 mg total) by mouth every 6 (six) hours as needed.   No facility-administered encounter medications on file as of 03/04/2021.    Allergies as of 03/04/2021 - Review Complete 03/04/2021  Allergen Reaction Noted   Ativan [lorazepam] Other (See Comments) 04/29/2018    Past Medical History:  Diagnosis Date   Allergy    as a child- none as adult    COPD (chronic obstructive  pulmonary disease) ([place])    History of bronchitis    Hypertension    Obesity    Oxygen deficiency    Pt uses 02  3 liters with his CPAP ONLY    PFO (patent foramen ovale)    Polycythemia    a. Dr. Marin Olp.Marland KitchenMarland KitchenJAK-2 analysis is negative.  b. likely physiologic secondary to chronic hypoxia (? OSA + COPD + obesity).  c. s/p plebotomy x1   PVD (peripheral vascular disease) ([facility])    Sleep apnea    a. mod severe by sleep study 9.30.11: AHI 44.8 per hour; oxygen desat to a nadir of 65%; unsuccessful CPAP titration; significant hypoxemia even with supp o2 at 3LPM (dest to 70-80%); needs eval for cardiopulmonary disease and upper airway obstruction   Substance abuse ([place])    alcoholism    Tobacco abuse     Past Surgical History:  Procedure Laterality Date   knee injury  [place]     as child     Family History  Problem Relation Age of Onset   Other Mother        Wegener's Disease   Lung cancer Paternal Grandmother    Drug abuse Father    Alcohol abuse Father    Asthma Father    Colon cancer Neg Hx    Colon polyps Neg Hx    Esophageal cancer Neg Hx    Rectal cancer Neg Hx  Stomach cancer Neg Hx     Social History   Socioeconomic History   Marital status: Divorced    Spouse name: Not on file   Number of children: 2   Years of education: Not on file   Highest education level: Not on file  Occupational History   Occupation: unemployed  Tobacco Use   Smoking status: Former    Packs/day: 0.50    Years: 25.00    Pack years: 12.50    Types: Cigarettes    Quit date: 03/21/2018    Years since quitting: 2.9   Smokeless tobacco: Never  Vaping Use   Vaping Use: Former  Substance and Sexual Activity   Alcohol use: Yes    Alcohol/week: 76.0 standard drinks    Types: 36 Cans of beer, 40 Shots of liquor per week   Drug use: Yes    Frequency: 1.0 times per week    Types: Marijuana    Comment: marajuana...also valium not  prescribed   Sexual activity: Not Currently  Other Topics Concern   Not on file  Social History Narrative   05/01/2018:   Lives with mother on one level   Has two 46yo sons who live nearby   Enjoys hunting/fishing   Since being hospitalized for COPD exacerbation, has quit smoking, and is motivated to do aerobic exercise   Heavy alcohol consumer, does not see it as a problem, no interest in reducing alcohol intake.   Social Determinants of Health   Financial Resource Strain: Low Risk    Difficulty of Paying Living Expenses: Not hard at all  Food Insecurity: No Food Insecurity   Worried About Charity fundraiser in the Last Year: Never true   [place] in the Last Year: Never true  Transportation Needs: No Transportation Needs   Lack of Transportation (Medical): No   Lack of Transportation (Non-Medical): No  Physical Activity: Insufficiently Active   Days of Exercise per Week: 2 days   Minutes of Exercise per Session: 30 min  Stress: No Stress Concern Present   Feeling of Stress : Only a little  Social Connections: Socially Isolated   Frequency of Communication with Friends and Family: More than three times a week   Frequency of Social Gatherings with Friends and Family: More than three times a week   Attends Religious Services: Never   Marine scientist or Organizations: No   Attends Music therapist: Never   Marital Status: Divorced  Human resources officer Violence: Not At Risk   Fear of Current or Ex-Partner: No   Emotionally Abused: No   Physically Abused: No   Sexually Abused: No    Review of Systems  Constitutional:  Negative for fatigue.  Respiratory:  Positive for apnea. Negative for shortness of breath.   Psychiatric/Behavioral:  Positive for sleep disturbance.    There were no vitals filed for this visit.    Physical Exam Constitutional:      Appearance: He is obese.  HENT:     Head: Normocephalic.     Mouth/Throat:     Mouth: Mucous  membranes are moist.     Comments: Mallampati 4, crowded oropharynx, macroglossia Cardiovascular:     Heart sounds: No murmur heard.   No friction rub.  Pulmonary:     Effort: No respiratory distress.     Breath sounds: No stridor. No wheezing or rhonchi.  Musculoskeletal:     Cervical back: No rigidity or tenderness.  Neurological:  Mental Status: He is alert.  Psychiatric:        Mood and Affect: Mood normal.   Results of the Epworth flowsheet 12/31/2020  Sitting and reading 2  Watching TV 2  Sitting, inactive in a public place (e.g. a theatre or a meeting) 1  As a passenger in a car for an hour without a break 1  Lying down to rest in the afternoon when circumstances permit 2  Sitting and talking to someone 0  Sitting quietly after a lunch without alcohol 1  In a car, while stopped for a few minutes in traffic 0  Total score 9     Data Reviewed: Compliance data reviewed showing 23% compliance Machine set between 5 and 20 Residual AHI 2.7 95 percentile pressure of 12.7    Assessment:  Patient with a history of obstructive sleep apnea -Continues to have difficulty tolerating -However, we will continue to try to get adjusted to it  He has chronic obstructive pulmonary disease -On bronchodilators -Follows up with Dr. Lamonte Sakai  Extensive discussions about inspire device -I did offer him a referral to ENT to discuss this  He will try to use CPAP at present  The importance of weight loss efforts discussed with patient -He will continue to work on weight loss  Plan/Recommendations: Encouraged to use CPAP  We have discussed contacting the DME company for mask adjustment -It will effectiveness   I will see him back in 6 months  I encouraged him to call with any significant concerns   Sherrilyn Rist MD  Pulmonary and Critical Care 03/04/2021, 9:09 AM  CC: Dorothyann Peng, NP

## 2021-03-29 DIAGNOSIS — G4733 Obstructive sleep apnea (adult) (pediatric): Secondary | ICD-10-CM | POA: Diagnosis not present

## 2021-04-26 DIAGNOSIS — G4733 Obstructive sleep apnea (adult) (pediatric): Secondary | ICD-10-CM | POA: Diagnosis not present

## 2021-05-09 ENCOUNTER — Telehealth: Payer: Self-pay | Admitting: Pharmacist

## 2021-05-09 NOTE — Chronic Care Management (AMB) (Signed)
? ? ?  Chronic Care Management ?Pharmacy Assistant  ? ?Name: Billy [name]  MRN: [id number] DOB: [birth year] ? ?05/10/2021 APPOINTMENT REMINDER ? ?Jann Ra was reminded to have all medications, supplements and any blood glucose and blood pressure readings available for review with Jeni Salles, Pharm. D, at his telephone visit on 05/10/2021 at 1:30. ? ? ?Questions: ?Have you had any recent office visit or specialist visit outside of [place]? Patient denies any recent visits outside of [facility].  ? ?Are there any concerns you would like to discuss during your office visit? Patient denies any concerns.  ? ?Are you having any problems obtaining your medications? (Whether it pharmacy issues or cost) Patient denies.  ? ?If patient has any PAP medications ask if they are having any problems getting their PAP medication or refill? Patient denies any medications from pap. ? ?Care Gaps: ?AWV - completed 11/10/2020 ?Last BP - 120/78 on 03/04/2021 ?Covid vaccine - never done ?Tetanus/TDAP - never done ?Shingrix- never done ?  ?Star Rating Drugs: ?Lisinopril 20 mg - last filled 02/27/2021 [facility] DS at [facility] ? ? ?Any gaps in medications fill history? No ? ?Gennie Alma CMA  ?Clinical Pharmacist Assistant ?[phone] ? ?

## 2021-05-10 ENCOUNTER — Ambulatory Visit (INDEPENDENT_AMBULATORY_CARE_PROVIDER_SITE_OTHER): Payer: Medicare Other | Admitting: Pharmacist

## 2021-05-10 DIAGNOSIS — I10 Essential (primary) hypertension: Secondary | ICD-10-CM

## 2021-05-10 DIAGNOSIS — J449 Chronic obstructive pulmonary disease, unspecified: Secondary | ICD-10-CM

## 2021-05-10 NOTE — Progress Notes (Signed)
? ?Chronic Care Management ?Pharmacy Note ? ?05/10/2021 ?Name:  Billy [name] MRN:  [id number] DOB:  June 06, 1970 ? ?Summary: ?BP is mostly at goal < 13/80 per office readings ? ?Recommendations/Changes made from today's visit: ?-Recommended weekly BP monitoring at home ?-Recommended bringing cuff to next office visit to ensure accuracy ?-Recommended decreasing to 81 mg of aspirin daily based on lack of indication with 162 mg daily ? ?Plan: ?Follow up COPD/BP assessment in 6 months ?Follow up in 1 year ? ?Subjective: ?Billy [name] is an 51 y.o. year old male who is a primary patient of Nafziger, Tommi Rumps, NP.  The CCM team was consulted for assistance with disease management and care coordination needs.   ? ?Engaged with patient face to face for follow up visit in response to provider referral for pharmacy case management and/or care coordination services.  ? ?Consent to Services:  ?The patient was given information about Chronic Care Management services, agreed to services, and gave verbal consent prior to initiation of services.  Please see initial visit note for detailed documentation.  ? ?Patient Care Team: ?Dorothyann Peng, NP as PCP - General (Family Medicine) ?Collene Gobble, MD as Consulting Physician (Pulmonary Disease) ?Viona Gilmore, [facility] as Pharmacist (Pharmacist) ? ?Recent office visits: ?01/07/21 Dorothyann Peng, NP: Patient presented for shoulder pain follow up. Administered steroid injection. ? ?12/31/20 Dorothyann Peng, NP: Patient presented for shoulder pain. Administered steroid injection. ? ?Recent consult visits: ?03/04/21 Sherrilyn Rist, MD (pulmonary): Patient presented for OSA follow up. Follow up in 6 months. ? ?12/31/2020 Sherrilyn Rist MD (pulmonary disease) - Patient was seen for obstructive sleep apnea and an additional issue. Discontinued Spiriva Handihaler. Follow up in 6-8 weeks. ? ?12/03/20 Kern Reap, MD (urology): Patient presented for follow up. Unable to access  notes. ? ?11/30/20 Baltazar Apo MD (Pulmonology) - seen for obstructive sleep apnea. Patient started on tiotropium bromide monohydrate 1.64mg 2 puffs daily. Follow up in 6 months.  ? ? ?Hospital visits: ?None in previous 6 months ? ?Objective: ? ?Lab Results  ?Component Value Date  ? CREATININE 0.97 08/05/2019  ? BUN 11 08/05/2019  ? GFR 81.94 08/05/2019  ? GFRNONAA >60 03/26/2018  ? GFRAA >60 03/26/2018  ? NA 135 08/05/2019  ? K 4.6 08/05/2019  ? CALCIUM 9.6 08/05/2019  ? CO2 29 08/05/2019  ? ? ?Lab Results  ?Component Value Date/Time  ? HGBA1C 5.6 08/05/2019 10:15 AM  ? HGBA1C 5.2 01/01/2019 04:08 PM  ? GFR 81.94 08/05/2019 10:15 AM  ? GFR 79.30 01/01/2019 04:08 PM  ?  ?Last diabetic Eye exam: No results found for: HMDIABEYEEXA  ?Last diabetic Foot exam: No results found for: HMDIABFOOTEX  ? ?Lab Results  ?Component Value Date  ? CHOL 206 (H) 08/05/2019  ? HDL 58.70 08/05/2019  ? LDLCALC 130 (H) 08/05/2019  ? TRIG 88.0 08/05/2019  ? CHOLHDL 4 08/05/2019  ? ? ?Hepatic Function Latest Ref Rng & Units 08/05/2019 07/12/2018 03/26/2018  ?Total Protein 6.0 - 8.3 g/dL 6.9 6.5 6.1(L)  ?Albumin 3.5 - 5.2 g/dL 4.6 - 3.4(L)  ?AST 0 - 37 U/L 23 23 48(H)  ?ALT 0 - 53 U/L 22 27 66(H)  ?Alk Phosphatase 39 - 117 U/L 46 - 38  ?Total Bilirubin 0.2 - 1.2 mg/dL 0.9 0.8 1.5(H)  ?Bilirubin, Direct 0.0 - 0.3 mg/dL - - -  ? ? ?Lab Results  ?Component Value Date/Time  ? TSH 1.96 08/05/2019 10:15 AM  ? TSH 1.12 07/31/2017 01:40 PM  ? ? ?CBC Latest  Ref Rng & Units 05/27/2020 08/05/2019 01/01/2019  ?WBC 4.0 - 10.5 K/uL 10.9(H) 7.8 8.3  ?Hemoglobin 13.0 - 17.0 g/dL 15.3 16.5 16.4  ?Hematocrit 39.0 - 52.0 % 44.2 47.9 48.5  ?Platelets 150.0 - 400.0 K/uL 175.0 155.0 212.0  ? ? ?No results found for: VD25OH ? ?Clinical ASCVD: No  ?The 10-year ASCVD risk score (Arnett DK, et al., 2019) is: 3.5% ?  Values used to calculate the score: ?    Age: 51 years ?    Sex: Male ?    Is Non-Hispanic African American: No ?    Diabetic: No ?    Tobacco smoker: No ?     Systolic Blood Pressure: 893 mmHg ?    Is BP treated: Yes ?    HDL Cholesterol: 58.7 mg/dL ?    Total Cholesterol: 206 mg/dL   ? ?Depression screen [facility] 2/9 11/10/2020 11/07/2019 05/01/2018  ?Decreased Interest 0 0 0  ?Down, Depressed, Hopeless 0 0 0  ?PHQ - 2 Score 0 0 0  ?Altered sleeping - 0 1  ?Tired, decreased energy - 0 1  ?Change in appetite - 0 1  ?Feeling bad or failure about yourself  - 0 0  ?Trouble concentrating - 0 0  ?Moving slowly or fidgety/restless - 0 0  ?Suicidal thoughts - 0 0  ?PHQ-9 Score - 0 3  ?Difficult doing work/chores - Not difficult at all -  ?  ? ? ?Social History  ? ?Tobacco Use  ?Smoking Status Former  ? Packs/day: 0.50  ? Years: 25.00  ? Pack years: 12.50  ? Types: Cigarettes  ? Quit date: 03/21/2018  ? Years since quitting: 3.1  ?Smokeless Tobacco Never  ? ?BP Readings from Last 3 Encounters:  ?03/04/21 120/78  ?01/07/21 90/70  ?12/31/20 138/60  ? ?Pulse Readings from Last 3 Encounters:  ?03/04/21 86  ?01/07/21 72  ?12/31/20 92  ? ?Wt Readings from Last 3 Encounters:  ?03/04/21 (!) 302 lb (137 kg)  ?01/07/21 296 lb (134.3 kg)  ?12/31/20 299 lb (135.6 kg)  ? ? ?Assessment/Interventions: Review of patient past medical history, allergies, medications, health status, including review of consultants reports, laboratory and other test data, was performed as part of comprehensive evaluation and provision of chronic care management services.  ? ?SDOH:  (Social Determinants of Health) assessments and interventions performed: No ? ? ?CCM Care Plan ? ?Allergies  ?Allergen Reactions  ? Ativan [Lorazepam] Other (See Comments)  ?  Pt given Ativan.  Pt became unresponsive.  ? ? ?Medications Reviewed Today   ? ? Reviewed by Viona Gilmore, [place] (Pharmacist) on 05/10/21 at 1406  Med List Status: <None>  ? ?Medication Order Taking? Sig Documenting Provider Last Dose Status Informant  ?albuterol (VENTOLIN HFA) 108 (90 Base) MCG/ACT inhaler [id number] Yes Inhale 1-2 puffs into the lungs every 6 (six) hours  as needed for wheezing or shortness of breath. Collene Gobble, MD Taking Active   ?aspirin 81 MG tablet [id number] Yes Take 2 tablets (161 mg total) by mouth daily. Jonetta Osgood, MD Taking Active   ?lisinopril (ZESTRIL) 20 MG tablet [id number] Yes TAKE 1 TABLET BY MOUTH EVERY DAY Nafziger, Tommi Rumps, NP Taking Active   ?SYMBICORT 160-4.5 MCG/ACT inhaler [id number] Yes TAKE 2 PUFFS BY MOUTH TWICE A DAY Byrum, Rose Fillers, MD Taking Active   ?traMADol (ULTRAM) 50 MG tablet [id number]  Take 2 tablets (100 mg total) by mouth every 6 (six) hours as needed. Laurey Morale, MD  Active   ? ?  ?  ? ?  ? ? ?  Patient Active Problem List  ? Diagnosis Date Noted  ? Acute respiratory failure with hypoxia and hypercapnia ([place]) 03/22/2018  ? Respiratory failure with hypoxia ([place]) 03/22/2018  ? Allergic rhinitis 06/25/2012  ? Polycythemia vera ([place]) 06/24/2012  ? CONJUNCTIVITIS, BACTERIAL 12/22/2009  ? Obstructive sleep apnea 12/14/2009  ? PATENT FORAMEN OVALE 11/17/2009  ? HYPOXEMIA 11/17/2009  ? Polycythemia, secondary 09/22/2009  ? THROMBOCYTOPENIA 09/16/2009  ? Alcohol abuse 09/16/2009  ? COPD (chronic obstructive pulmonary disease) ([place]) 09/16/2009  ? OBESITY 01/06/2009  ? TOBACCO ABUSE 01/06/2009  ? PERIPHERAL VASCULAR DISEASE 01/06/2009  ? BRONCHITIS 01/06/2009  ? ? ?There is no immunization history for the selected administration types on file for this patient. ? ?Conditions to be addressed/monitored:  ?Hypertension, COPD, Tobacco use and polycythemia, obstructive sleep apnea ? ?Conditions addressed this visit: ?Hypertension, COPD ? ?BP Readings from Last 3 Encounters:  ?03/04/21 120/78  ?01/07/21 90/70  ?12/31/20 138/60  ? ?Pulse ox is 88-92  - walking a mile every day - has slacked on this, girlfriend likes to snack - he wants to get back to walking daily, access to a pool - big vacation coming up in august - [place]  ? ?BP - not checking at home - probably about a month; 110/75 - only checks when he feels it off - tries to  do once a week ? ?Spiriva or not? - he stopped the spiriva  a while ago - he has been using the symbicort - using the rescue inhaler more with the colder weather - over exertion and colder days - 2 inalers - once

## 2021-05-13 ENCOUNTER — Other Ambulatory Visit (HOSPITAL_COMMUNITY)
Admission: RE | Admit: 2021-05-13 | Discharge: 2021-05-13 | Disposition: A | Discharge status: Home / Self Care | Payer: Medicare Other | Source: Ambulatory Visit | Attending: Adult Health | Admitting: Adult Health

## 2021-05-13 ENCOUNTER — Encounter: Payer: Self-pay | Admitting: Adult Health

## 2021-05-13 ENCOUNTER — Ambulatory Visit (INDEPENDENT_AMBULATORY_CARE_PROVIDER_SITE_OTHER): Payer: Medicare Other | Admitting: Adult Health

## 2021-05-13 VITALS — BP 98/60 | HR 62 | Temp 98.2°F | Ht 73.0 in | Wt 302.0 lb

## 2021-05-13 DIAGNOSIS — B379 Candidiasis, unspecified: Secondary | ICD-10-CM | POA: Insufficient documentation

## 2021-05-13 MED ORDER — DOXYCYCLINE HYCLATE 100 MG PO CAPS: 100.0000 mg | ORAL_CAPSULE | Freq: Two times a day (BID) | ORAL | 0 refills | Status: DC

## 2021-05-13 MED ORDER — TERBINAFINE HCL 250 MG PO TABS: ORAL_TABLET | ORAL | 1 refills | Status: DC

## 2021-05-13 NOTE — Progress Notes (Signed)
? ?Subjective:  ? ? Patient ID: Billy [name], male    DOB: 10/09/1970, 51 y.o.   MRN: [id number] ? ?HPI ?51 year old male who  has a past medical history of Allergy, COPD (chronic obstructive pulmonary disease) ([place]), History of bronchitis, Hypertension, Obesity, Oxygen deficiency, PFO (patent foramen ovale), Polycythemia, PVD (peripheral vascular disease) ([place]), Sleep apnea, Substance abuse ([place]), and Tobacco abuse. ? ?He presents to the office today for recurrent yeast infection on his penis.  He was last treated for this in April 2022 with Diflucan.  He does report that it cleared up but not completely and has continued to be present.  He has been applying vaginocele which seems to help to some degree but does not completely clear it.  Also been applying antibiotic ointment and using powder to help keep his self dry during the day. ? ? ?Wt Readings from Last 3 Encounters:  ?05/13/21 (!) 302 lb (137 kg)  ?03/04/21 (!) 302 lb (137 kg)  ?01/07/21 296 lb (134.3 kg)  ? ? ?Review of Systems ?See HPI  ? ?Past Medical History:  ?Diagnosis Date  ? Allergy   ? as a child- none as adult   ? COPD (chronic obstructive pulmonary disease) ([place])   ? History of bronchitis   ? Hypertension   ? Obesity   ? Oxygen deficiency   ? Pt uses 02  3 liters with his CPAP ONLY   ? PFO (patent foramen ovale)   ? Polycythemia   ? a. Dr. Marin Olp.Marland KitchenMarland KitchenJAK-2 analysis is negative.  b. likely physiologic secondary to chronic hypoxia (? OSA + COPD + obesity).  c. s/p plebotomy x1  ? PVD (peripheral vascular disease) ([place])   ? Sleep apnea   ? a. mod severe by sleep study 9.30.11: AHI 44.8 per hour; oxygen desat to a nadir of 65%; unsuccessful CPAP titration; significant hypoxemia even with supp o2 at 3LPM (dest to 70-80%); needs eval for cardiopulmonary disease and upper airway obstruction  ? Substance abuse ([place])   ? alcoholism   ? Tobacco abuse   ? ? ?Social History  ? ?Socioeconomic History  ? Marital status: Divorced  ?  Spouse name: Not on  file  ? Number of children: 2  ? Years of education: Not on file  ? Highest education level: Not on file  ?Occupational History  ? Occupation: unemployed  ?Tobacco Use  ? Smoking status: Former  ?  Packs/day: 0.50  ?  Years: 25.00  ?  Pack years: 12.50  ?  Types: Cigarettes  ?  Quit date: 03/21/2018  ?  Years since quitting: 3.1  ? Smokeless tobacco: Never  ?Vaping Use  ? Vaping Use: Former  ?Substance and Sexual Activity  ? Alcohol use: Yes  ?  Alcohol/week: 76.0 standard drinks  ?  Types: 36 Cans of beer, 40 Shots of liquor per week  ? Drug use: Yes  ?  Frequency: 1.0 times per week  ?  Types: Marijuana  ?  Comment: marajuana...also valium not prescribed  ? Sexual activity: Not Currently  ?Other Topics Concern  ? Not on file  ?Social History Narrative  ? 05/01/2018:  ? Lives with mother on one level  ? Has two 70yo sons who live nearby  ? Enjoys hunting/fishing  ? Since being hospitalized for COPD exacerbation, has quit smoking, and is motivated to do aerobic exercise  ? Heavy alcohol consumer, does not see it as a problem, no interest in reducing alcohol intake.  ? ?  Social Determinants of Health  ? ?Financial Resource Strain: Low Risk   ? Difficulty of Paying Living Expenses: Not hard at all  ?Food Insecurity: No Food Insecurity  ? Worried About Charity fundraiser in the Last Year: Never true  ? Ran Out of Food in the Last Year: Never true  ?Transportation Needs: No Transportation Needs  ? Lack of Transportation (Medical): No  ? Lack of Transportation (Non-Medical): No  ?Physical Activity: Insufficiently Active  ? Days of Exercise per Week: 2 days  ? Minutes of Exercise per Session: 30 min  ?Stress: No Stress Concern Present  ? Feeling of Stress : Only a little  ?Social Connections: Socially Isolated  ? Frequency of Communication with Friends and Family: More than three times a week  ? Frequency of Social Gatherings with Friends and Family: More than three times a week  ? Attends Religious Services: Never  ? Active  Member of Clubs or Organizations: No  ? Attends Archivist Meetings: Never  ? Marital Status: Divorced  ?Intimate Partner Violence: Not At Risk  ? Fear of Current or Ex-Partner: No  ? Emotionally Abused: No  ? Physically Abused: No  ? Sexually Abused: No  ? ? ?Past Surgical History:  ?Procedure Laterality Date  ? knee injury  1982  ? TONSILLECTOMY  1972  ? TYMPANOSTOMY TUBE PLACEMENT    ? as child   ? ? ?Family History  ?Problem Relation Age of Onset  ? Other Mother   ?     Wegener's Disease  ? Lung cancer Paternal Grandmother   ? Drug abuse Father   ? Alcohol abuse Father   ? Asthma Father   ? Colon cancer Neg Hx   ? Colon polyps Neg Hx   ? Esophageal cancer Neg Hx   ? Rectal cancer Neg Hx   ? Stomach cancer Neg Hx   ? ? ?Allergies  ?Allergen Reactions  ? Ativan [Lorazepam] Other (See Comments)  ?  Pt given Ativan.  Pt became unresponsive.  ? ? ?Current Outpatient Medications on File Prior to Visit  ?Medication Sig Dispense Refill  ? albuterol (VENTOLIN HFA) 108 (90 Base) MCG/ACT inhaler Inhale 1-2 puffs into the lungs every 6 (six) hours as needed for wheezing or shortness of breath. 8.5 each 4  ? aspirin 81 MG tablet Take 2 tablets (161 mg total) by mouth daily. 30 tablet 0  ? lisinopril (ZESTRIL) 20 MG tablet TAKE 1 TABLET BY MOUTH EVERY DAY 90 tablet 3  ? SYMBICORT 160-4.5 MCG/ACT inhaler TAKE 2 PUFFS BY MOUTH TWICE A DAY 10.2 each 11  ? traMADol (ULTRAM) 50 MG tablet Take 2 tablets (100 mg total) by mouth every 6 (six) hours as needed. 60 tablet 0  ? ?No current facility-administered medications on file prior to visit.  ? ? ?BP 98/60   Pulse 62   Temp 98.2 ?F (36.8 ?C) (Oral)   Ht '6\' 1"'$  (1.854 m)   Wt (!) 302 lb (137 kg)   SpO2 91%   BMI 39.84 kg/m?  ? ? ?   ?Objective:  ? Physical Exam ?Vitals and nursing note reviewed.  ?Constitutional:   ?   Appearance: Normal appearance.  ?Genitourinary: ?   Pubic Area: Rash present.  ?   Penis: Circumcised.   ?   Comments: Does have a flat red rash around  the head of his penis that extends down into the shaft.  Possible slight skin infection noted as well. ?Musculoskeletal:     ?  General: Normal range of motion.  ?Skin: ?   General: Skin is warm and dry.  ?   Findings: Erythema present.  ?Neurological:  ?   General: No focal deficit present.  ?   Mental Status: He is alert and oriented to person, place, and time.  ?Psychiatric:     ?   Mood and Affect: Mood normal.     ?   Behavior: Behavior normal.     ?   Thought Content: Thought content normal.  ? ?   ?Assessment & Plan:  ?1. Candida infection ?-We will treat with Lamisil and doxycycline.  He is not concerned about STDs but would like to be checked.  Encouraged to shower after sex ?- doxycycline (VIBRAMYCIN) 100 MG capsule; Take 1 capsule (100 mg total) by mouth 2 (two) times daily.  Dispense: 14 capsule; Refill: 0 ?- terbinafine (LAMISIL) 250 MG tablet; Take one tablet daily for 2 weeks  Dispense: 30 tablet; Refill: 1 ?- Urine cytology ancillary only; Future ?- HIV Antibody (routine testing w rflx); Future ?- RPR; Future ?- RPR ?- HIV Antibody (routine testing w rflx) ?- Urine cytology ancillary only ? ?Dorothyann Peng, NP ? ?

## 2021-05-16 LAB — URINE CYTOLOGY ANCILLARY ONLY
Bacterial Vaginitis-Urine: NEGATIVE
Candida Urine: NEGATIVE
Chlamydia: NEGATIVE
Comment: NEGATIVE
Comment: NEGATIVE
Comment: NORMAL
Neisseria Gonorrhea: NEGATIVE
Trichomonas: NEGATIVE

## 2021-05-16 LAB — RPR: RPR Ser Ql: NONREACTIVE

## 2021-05-16 LAB — HIV ANTIBODY (ROUTINE TESTING W REFLEX): HIV 1&2 Ab, 4th Generation: NONREACTIVE

## 2021-05-26 ENCOUNTER — Ambulatory Visit (INDEPENDENT_AMBULATORY_CARE_PROVIDER_SITE_OTHER): Payer: Medicare Other | Admitting: Adult Health

## 2021-05-26 ENCOUNTER — Encounter: Payer: Self-pay | Admitting: Adult Health

## 2021-05-26 VITALS — BP 106/66 | HR 91 | Temp 98.1°F | Ht 73.0 in | Wt 303.4 lb

## 2021-05-26 DIAGNOSIS — R21 Rash and other nonspecific skin eruption: Secondary | ICD-10-CM

## 2021-05-26 NOTE — Progress Notes (Signed)
? ?Subjective:  ? ? Patient ID: Billy [name], male    DOB: 1970/04/29, 51 y.o.   MRN: [id number] ? ?HPI ?51 year old male who  has a past medical history of Allergy, COPD (chronic obstructive pulmonary disease) ([place]), History of bronchitis, Hypertension, Obesity, Oxygen deficiency, PFO (patent foramen ovale), Polycythemia, PVD (peripheral vascular disease) ([place]), Sleep apnea, Substance abuse ([place]), and Tobacco abuse. ? ?He was seen roughly 1 week ago for recurrent infection on his penis.  Previous to that he was treated in April 22 with Diflucan.  He reported that it cleared up not completely and continue to be present.  In the past it was thought that he and his partner were passing a yeast infection back and forth between each other due to her being on chemo for breast cancer.  Most recently he was prescribed Lamisil p.o. and doxycycline for suspected yeast infection with concern for bacterial infection.  He reports no improvement and when he has sex feels more "raw" than normal. ? ? ?Review of Systems ?See HPI  ? ?Past Medical History:  ?Diagnosis Date  ? Allergy   ? as a child- none as adult   ? COPD (chronic obstructive pulmonary disease) ([place])   ? History of bronchitis   ? Hypertension   ? Obesity   ? Oxygen deficiency   ? Pt uses 02  3 liters with his CPAP ONLY   ? PFO (patent foramen ovale)   ? Polycythemia   ? a. Dr. Marin Olp.Marland KitchenMarland KitchenJAK-2 analysis is negative.  b. likely physiologic secondary to chronic hypoxia (? OSA + COPD + obesity).  c. s/p plebotomy x1  ? PVD (peripheral vascular disease) ([place])   ? Sleep apnea   ? a. mod severe by sleep study 9.30.11: AHI 44.8 per hour; oxygen desat to a nadir of 65%; unsuccessful CPAP titration; significant hypoxemia even with supp o2 at 3LPM (dest to 70-80%); needs eval for cardiopulmonary disease and upper airway obstruction  ? Substance abuse ([place])   ? alcoholism   ? Tobacco abuse   ? ? ?Social History  ? ?Socioeconomic History  ? Marital status: Divorced  ?   Spouse name: Not on file  ? Number of children: 2  ? Years of education: Not on file  ? Highest education level: Not on file  ?Occupational History  ? Occupation: unemployed  ?Tobacco Use  ? Smoking status: Former  ?  Packs/day: 0.50  ?  Years: 25.00  ?  Pack years: 12.50  ?  Types: Cigarettes  ?  Quit date: 03/21/2018  ?  Years since quitting: 3.1  ? Smokeless tobacco: Never  ?Vaping Use  ? Vaping Use: Former  ?Substance and Sexual Activity  ? Alcohol use: Yes  ?  Alcohol/week: 76.0 standard drinks  ?  Types: 36 Cans of beer, 40 Shots of liquor per week  ? Drug use: Yes  ?  Frequency: 1.0 times per week  ?  Types: Marijuana  ?  Comment: marajuana...also valium not prescribed  ? Sexual activity: Not Currently  ?Other Topics Concern  ? Not on file  ?Social History Narrative  ? 05/01/2018:  ? Lives with mother on one level  ? Has two 33yo sons who live nearby  ? Enjoys hunting/fishing  ? Since being hospitalized for COPD exacerbation, has quit smoking, and is motivated to do aerobic exercise  ? Heavy alcohol consumer, does not see it as a problem, no interest in reducing alcohol intake.  ? ?Social Determinants of  Health  ? ?Financial Resource Strain: Low Risk   ? Difficulty of Paying Living Expenses: Not hard at all  ?Food Insecurity: No Food Insecurity  ? Worried About Charity fundraiser in the Last Year: Never true  ? Ran Out of Food in the Last Year: Never true  ?Transportation Needs: No Transportation Needs  ? Lack of Transportation (Medical): No  ? Lack of Transportation (Non-Medical): No  ?Physical Activity: Insufficiently Active  ? Days of Exercise per Week: 2 days  ? Minutes of Exercise per Session: 30 min  ?Stress: No Stress Concern Present  ? Feeling of Stress : Only a little  ?Social Connections: Socially Isolated  ? Frequency of Communication with Friends and Family: More than three times a week  ? Frequency of Social Gatherings with Friends and Family: More than three times a week  ? Attends Religious  Services: Never  ? Active Member of Clubs or Organizations: No  ? Attends Archivist Meetings: Never  ? Marital Status: Divorced  ?Intimate Partner Violence: Not At Risk  ? Fear of Current or Ex-Partner: No  ? Emotionally Abused: No  ? Physically Abused: No  ? Sexually Abused: No  ? ? ?Past Surgical History:  ?Procedure Laterality Date  ? knee injury  1982  ? TONSILLECTOMY  1972  ? TYMPANOSTOMY TUBE PLACEMENT    ? as child   ? ? ?Family History  ?Problem Relation Age of Onset  ? Other Mother   ?     Wegener's Disease  ? Lung cancer Paternal Grandmother   ? Drug abuse Father   ? Alcohol abuse Father   ? Asthma Father   ? Colon cancer Neg Hx   ? Colon polyps Neg Hx   ? Esophageal cancer Neg Hx   ? Rectal cancer Neg Hx   ? Stomach cancer Neg Hx   ? ? ?Allergies  ?Allergen Reactions  ? Ativan [Lorazepam] Other (See Comments)  ?  Pt given Ativan.  Pt became unresponsive.  ? ? ?Current Outpatient Medications on File Prior to Visit  ?Medication Sig Dispense Refill  ? albuterol (VENTOLIN HFA) 108 (90 Base) MCG/ACT inhaler Inhale 1-2 puffs into the lungs every 6 (six) hours as needed for wheezing or shortness of breath. 8.5 each 4  ? aspirin 81 MG tablet Take 2 tablets (161 mg total) by mouth daily. 30 tablet 0  ? doxycycline (VIBRAMYCIN) 100 MG capsule Take 1 capsule (100 mg total) by mouth 2 (two) times daily. 14 capsule 0  ? lisinopril (ZESTRIL) 20 MG tablet TAKE 1 TABLET BY MOUTH EVERY DAY 90 tablet 3  ? SYMBICORT 160-4.5 MCG/ACT inhaler TAKE 2 PUFFS BY MOUTH TWICE A DAY 10.2 each 11  ? terbinafine (LAMISIL) 250 MG tablet Take one tablet daily for 2 weeks 30 tablet 1  ? traMADol (ULTRAM) 50 MG tablet Take 2 tablets (100 mg total) by mouth every 6 (six) hours as needed. 60 tablet 0  ? ?No current facility-administered medications on file prior to visit.  ? ? ?BP 106/66 (BP Location: Left Arm, Patient Position: Sitting, Cuff Size: Large)   Pulse 91   Temp 98.1 ?F (36.7 ?C) (Oral)   Ht '6\' 1"'$  (1.854 m)   Wt (!)  303 lb 6.4 oz (137.6 kg)   SpO2 94%   BMI 40.03 kg/m?  ? ? ?   ?Objective:  ? Physical Exam ?Vitals and nursing note reviewed.  ?Constitutional:   ?   Appearance: Normal appearance.  ?Skin: ?  Capillary Refill: Capillary refill takes less than 2 seconds.  ?Neurological:  ?   General: No focal deficit present.  ?   Mental Status: He is oriented to person, place, and time.  ?Psychiatric:     ?   Mood and Affect: Mood normal.     ?   Behavior: Behavior normal.     ?   Thought Content: Thought content normal.     ?   Judgment: Judgment normal.  ? ?   ?Assessment & Plan:  ?1. Rash of penis ?-I am wondering now if this is an allergy between him and his fianc?e.  We will send to dermatology for further evaluation.  Encouraged to try using condoms to see if this helps resolve his symptoms. ?- Ambulatory referral to Dermatology ? ?Dorothyann Peng, NP ? ?

## 2021-05-27 DIAGNOSIS — I10 Essential (primary) hypertension: Secondary | ICD-10-CM

## 2021-05-27 DIAGNOSIS — J449 Chronic obstructive pulmonary disease, unspecified: Secondary | ICD-10-CM

## 2021-05-27 DIAGNOSIS — G4733 Obstructive sleep apnea (adult) (pediatric): Secondary | ICD-10-CM | POA: Diagnosis not present

## 2021-06-03 ENCOUNTER — Ambulatory Visit: Payer: Medicare Other | Admitting: Emergency Medicine

## 2021-06-09 DIAGNOSIS — L309 Dermatitis, unspecified: Secondary | ICD-10-CM | POA: Diagnosis not present

## 2021-06-10 ENCOUNTER — Ambulatory Visit (INDEPENDENT_AMBULATORY_CARE_PROVIDER_SITE_OTHER): Payer: Medicare Other | Admitting: Emergency Medicine

## 2021-06-10 ENCOUNTER — Encounter: Payer: Self-pay | Admitting: Emergency Medicine

## 2021-06-10 DIAGNOSIS — G4733 Obstructive sleep apnea (adult) (pediatric): Secondary | ICD-10-CM | POA: Diagnosis not present

## 2021-06-10 MED ORDER — VALSARTAN 160 MG PO TABS: 160.0000 mg | ORAL_TABLET | Freq: Every day | ORAL | 1 refills | Status: DC

## 2021-06-10 NOTE — Assessment & Plan Note (Signed)
Improved compliance since last time.  He did benefit from talking to Dr. Ander Slade although he is not a candidate currently for an inspire device.  Plan to continue CPAP nightly ?

## 2021-06-10 NOTE — Progress Notes (Signed)
?Subjective:  ? ? Patient ID: YOHANCE HATHORNE, male    DOB: [birth year], 51 y.o.   MRN: [id number] ?HPI ?51 yo male with Severe COPD, severe OSA, pulmonary HTN, hypoxemia  ? ?ROV 11/30/20 --51 year old man with history of OSA/OHS that is been treated with CPAP therapy.  He also has severe COPD.  He has had hypoxemic respiratory failure with associated polycythemia although this has improved.  I last saw him in April 2022.  His CPAP compliance is poor.  Today he reports that he has heard some wheeze intermittently over the last 6 months. Has some UA mucous and cough. Has good exercise tolerance, no flares since 07/2019.  ?Has been on Symbicort, came off Spiriva.  ?Wt is up to 295 lbs from 185.  ?He is wearing CPAP occasionally, has leak and feels that the pressure is too high. Also causing dry mouth. Uses a full face mask. He is up frequently to urinate.  ? ?ROV 06/10/2021 --follow-up visit 51 year old gentleman with history of OSA/OHS.  He also has severe obstructive disease on pulmonary function testing and history of hypoxemic respiratory failure with associated polycythemia that has somewhat improved over the last several years.  He does not use oxygen during the daytime.  He has seen Dr. Ander Slade since I last saw him, is not a candidate currently for inspire device.  He reports that his CPAP compliance has been improved - he did benefit from speaking with Dr Ander Slade. Wears it most nights and usually for > 4 hours. Still dries out his mouth. Has gained some wt now 302 lbs ?He is no longer on Spiriva and is taking Symbicort, reports occasional albuterol a few times a week, usually w exertion. No flares or prednisone since last time.  ?He is having increased throat wheeze and dry cough - started on lisinopril since last time.  ? ?                                                                                             ? ?Objective:  ? Physical Exam  ?Vitals:  ? 06/10/21 0859  ?BP: 122/68  ?Pulse: 85  ?Temp: 98.6 ?F  (37 ?C)  ?TempSrc: Oral  ?SpO2: 94%  ?Weight: (!) 302 lb 12.8 oz (137.3 kg)  ?Height: '6\' 1"'$  (1.854 m)  ? ?Gen: Pleasant, obese, in no distress,  normal affect ? ?ENT: No lesions,  mouth clear,  oropharynx clear, no postnasal drip ? ?Neck: No JVD, no stridor ? ?Lungs: clear B, no wheezes ? ?Cardiovascular: RRR, heart sounds normal, no murmur or gallops, no LE edema ? ?Musculoskeletal: No deformities, no cyanosis or clubbing ? ?Neuro: alert, non focal ? ?Skin: some chronic venous stasis changes  ? ? ? ?Assessment & Plan:  ?COPD (chronic obstructive pulmonary disease) ([place]) ?Overall stable with no flares.  He does have some increased dry cough and throat wheezing.  Question whether this relates into to his lisinopril.  He is willing to do a try changing ACE inhibitor to ARB.  We will start valsartan 160 and see how he tolerates. ? ?Please stop your lisinopril for now ?We will try  starting valsartan 160 mg once daily.  This substitutes for the lisinopril ?Continue your Symbicort 2 puffs twice a day.  Rinse and gargle after using. ?Keep your albuterol available to use 2 puffs when you needed for shortness of breath, chest tightness, wheezing. ?Follow with Dr Lamonte Sakai in 6 months or sooner if you have any problems ? ?Obstructive sleep apnea ?Improved compliance since last time.  He did benefit from talking to Dr. Ander Slade although he is not a candidate currently for an inspire device.  Plan to continue CPAP nightly ? ? ?Baltazar Apo, MD, PhD ?06/10/2021, 9:25 AM ?[facility] ?[phone] or if no answer [phone] ? ?

## 2021-06-10 NOTE — Assessment & Plan Note (Signed)
Overall stable with no flares.  He does have some increased dry cough and throat wheezing.  Question whether this relates into to his lisinopril.  He is willing to do a try changing ACE inhibitor to ARB.  We will start valsartan 160 and see how he tolerates. ? ?Please stop your lisinopril for now ?We will try starting valsartan 160 mg once daily.  This substitutes for the lisinopril ?Continue your Symbicort 2 puffs twice a day.  Rinse and gargle after using. ?Keep your albuterol available to use 2 puffs when you needed for shortness of breath, chest tightness, wheezing. ?Follow with Dr Lamonte Sakai in 6 months or sooner if you have any problems ?

## 2021-06-10 NOTE — Patient Instructions (Signed)
Please stop your lisinopril for now ?We will try starting valsartan 160 mg once daily.  This substitutes for the lisinopril ?Continue your Symbicort 2 puffs twice a day.  Rinse and gargle after using. ?Keep your albuterol available to use 2 puffs when you needed for shortness of breath, chest tightness, wheezing. ?Continue to wear your CPAP reliably at night. ?Follow with Dr Lamonte Sakai in 6 months or sooner if you have any problems ? ?

## 2021-06-26 DIAGNOSIS — G4733 Obstructive sleep apnea (adult) (pediatric): Secondary | ICD-10-CM | POA: Diagnosis not present

## 2021-06-28 ENCOUNTER — Ambulatory Visit (INDEPENDENT_AMBULATORY_CARE_PROVIDER_SITE_OTHER): Payer: Medicare Other | Admitting: Adult Health

## 2021-06-28 ENCOUNTER — Encounter: Payer: Self-pay | Admitting: Adult Health

## 2021-06-28 VITALS — BP 122/80 | HR 86 | Temp 98.3°F | Ht 73.0 in | Wt 310.0 lb

## 2021-06-28 DIAGNOSIS — F39 Unspecified mood [affective] disorder: Secondary | ICD-10-CM | POA: Diagnosis not present

## 2021-06-28 MED ORDER — BUPROPION HCL ER (XL) 150 MG PO TB24: 150.0000 mg | ORAL_TABLET | Freq: Every day | ORAL | 0 refills | Status: DC

## 2021-06-28 NOTE — Progress Notes (Signed)
? ?Subjective:  ? ? Patient ID: Billy [name], male    DOB: 1970-04-25, 51 y.o.   MRN: [id number] ? ?HPI ?51 year old male who  has a past medical history of Allergy, COPD (chronic obstructive pulmonary disease) ([place]), History of bronchitis, Hypertension, Obesity, Oxygen deficiency, PFO (patent foramen ovale), Polycythemia, PVD (peripheral vascular disease) ([place]), Sleep apnea, Substance abuse ([place]), and Tobacco abuse. ? ?He presents to the office today for concern of anxiety and mood disorder. He reports that " I am wound like a top and I am easily annoyed, I have no patience and I have been snapping on people that I love."   ? ? ?Review of Systems ?See HPI  ? ?Past Medical History:  ?Diagnosis Date  ? Allergy   ? as a child- none as adult   ? COPD (chronic obstructive pulmonary disease) ([place])   ? History of bronchitis   ? Hypertension   ? Obesity   ? Oxygen deficiency   ? Pt uses 02  3 liters with his CPAP ONLY   ? PFO (patent foramen ovale)   ? Polycythemia   ? a. Dr. Marin Olp.Marland KitchenMarland KitchenJAK-2 analysis is negative.  b. likely physiologic secondary to chronic hypoxia (? OSA + COPD + obesity).  c. s/p plebotomy x1  ? PVD (peripheral vascular disease) ([place])   ? Sleep apnea   ? a. mod severe by sleep study 9.30.11: AHI 44.8 per hour; oxygen desat to a nadir of 65%; unsuccessful CPAP titration; significant hypoxemia even with supp o2 at 3LPM (dest to 70-80%); needs eval for cardiopulmonary disease and upper airway obstruction  ? Substance abuse ([place])   ? alcoholism   ? Tobacco abuse   ? ? ?Social History  ? ?Socioeconomic History  ? Marital status: Divorced  ?  Spouse name: Not on file  ? Number of children: 2  ? Years of education: Not on file  ? Highest education level: Not on file  ?Occupational History  ? Occupation: unemployed  ?Tobacco Use  ? Smoking status: Former  ?  Packs/day: 0.50  ?  Years: 25.00  ?  Pack years: 12.50  ?  Types: Cigarettes  ?  Quit date: 03/21/2018  ?  Years since quitting: 3.2  ? Smokeless  tobacco: Never  ?Vaping Use  ? Vaping Use: Former  ?Substance and Sexual Activity  ? Alcohol use: Yes  ?  Alcohol/week: 76.0 standard drinks  ?  Types: 36 Cans of beer, 40 Shots of liquor per week  ? Drug use: Yes  ?  Frequency: 1.0 times per week  ?  Types: Marijuana  ?  Comment: marajuana...also valium not prescribed  ? Sexual activity: Not Currently  ?Other Topics Concern  ? Not on file  ?Social History Narrative  ? 05/01/2018:  ? Lives with mother on one level  ? Has two 2yo sons who live nearby  ? Enjoys hunting/fishing  ? Since being hospitalized for COPD exacerbation, has quit smoking, and is motivated to do aerobic exercise  ? Heavy alcohol consumer, does not see it as a problem, no interest in reducing alcohol intake.  ? ?Social Determinants of Health  ? ?Financial Resource Strain: Low Risk   ? Difficulty of Paying Living Expenses: Not hard at all  ?Food Insecurity: No Food Insecurity  ? Worried About Charity fundraiser in the Last Year: Never true  ? Ran Out of Food in the Last Year: Never true  ?Transportation Needs: No Transportation Needs  ? Lack  of Transportation (Medical): No  ? Lack of Transportation (Non-Medical): No  ?Physical Activity: Insufficiently Active  ? Days of Exercise per Week: 2 days  ? Minutes of Exercise per Session: 30 min  ?Stress: No Stress Concern Present  ? Feeling of Stress : Only a little  ?Social Connections: Socially Isolated  ? Frequency of Communication with Friends and Family: More than three times a week  ? Frequency of Social Gatherings with Friends and Family: More than three times a week  ? Attends Religious Services: Never  ? Active Member of Clubs or Organizations: No  ? Attends Archivist Meetings: Never  ? Marital Status: Divorced  ?Intimate Partner Violence: Not At Risk  ? Fear of Current or Ex-Partner: No  ? Emotionally Abused: No  ? Physically Abused: No  ? Sexually Abused: No  ? ? ?Past Surgical History:  ?Procedure Laterality Date  ? knee injury  1982   ? TONSILLECTOMY  1972  ? TYMPANOSTOMY TUBE PLACEMENT    ? as child   ? ? ?Family History  ?Problem Relation Age of Onset  ? Other Mother   ?     Wegener's Disease  ? Lung cancer Paternal Grandmother   ? Drug abuse Father   ? Alcohol abuse Father   ? Asthma Father   ? Colon cancer Neg Hx   ? Colon polyps Neg Hx   ? Esophageal cancer Neg Hx   ? Rectal cancer Neg Hx   ? Stomach cancer Neg Hx   ? ? ?Allergies  ?Allergen Reactions  ? Ativan [Lorazepam] Other (See Comments)  ?  Pt given Ativan.  Pt became unresponsive.  ? ? ?Current Outpatient Medications on File Prior to Visit  ?Medication Sig Dispense Refill  ? albuterol (VENTOLIN HFA) 108 (90 Base) MCG/ACT inhaler Inhale 1-2 puffs into the lungs every 6 (six) hours as needed for wheezing or shortness of breath. 8.5 each 4  ? aspirin 81 MG tablet Take 2 tablets (161 mg total) by mouth daily. 30 tablet 0  ? lisinopril (ZESTRIL) 20 MG tablet TAKE 1 TABLET BY MOUTH EVERY DAY 90 tablet 3  ? SYMBICORT 160-4.5 MCG/ACT inhaler TAKE 2 PUFFS BY MOUTH TWICE A DAY 10.2 each 11  ? terbinafine (LAMISIL) 250 MG tablet Take 250 mg by mouth daily.    ? traMADol (ULTRAM) 50 MG tablet Take 2 tablets (100 mg total) by mouth every 6 (six) hours as needed. 60 tablet 0  ? valsartan (DIOVAN) 160 MG tablet Take 1 tablet (160 mg total) by mouth daily. 30 tablet 1  ? ?No current facility-administered medications on file prior to visit.  ? ? ?BP 122/80   Pulse 86   Temp 98.3 ?F (36.8 ?C) (Oral)   Ht '6\' 1"'$  (1.854 m)   Wt (!) 310 lb (140.6 kg)   SpO2 90%   BMI 40.90 kg/m?  ? ? ?   ?Objective:  ? Physical Exam ?Vitals and nursing note reviewed.  ?Constitutional:   ?   Appearance: Normal appearance.  ?Cardiovascular:  ?   Rate and Rhythm: Normal rate and regular rhythm.  ?   Pulses: Normal pulses.  ?   Heart sounds: Normal heart sounds.  ?Pulmonary:  ?   Effort: Pulmonary effort is normal.  ?   Breath sounds: Normal breath sounds.  ?Musculoskeletal:     ?   General: Normal range of motion.   ?Skin: ?   General: Skin is warm and dry.  ?   Capillary  Refill: Capillary refill takes less than 2 seconds.  ?Neurological:  ?   General: No focal deficit present.  ?   Mental Status: He is alert and oriented to person, place, and time.  ?Psychiatric:     ?   Mood and Affect: Mood normal.     ?   Behavior: Behavior normal.     ?   Thought Content: Thought content normal.     ?   Judgment: Judgment normal.  ? ?   ?Assessment & Plan:  ?1. Mood disorder ([place]) ?- Will trial him on Wellbutrin  ?- buPROPion (WELLBUTRIN XL) 150 MG 24 hr tablet; Take 1 tablet (150 mg total) by mouth daily.  Dispense: 90 tablet; Refill: 0 ?- Follow up in one month or sooner if needed ? ?Dorothyann Peng, NP ? ? ?Time of total for care on the day of the encounter 30 minutes. This includes time spent in both face-to-face and non-face-to-face activities including preparing for the visit, reviewing the chart, time spent with patient, evaluation and counseling patient/family/caregiver, coordinating care, and time spent documenting in the chart which was performed on the date of service (06/28/2021). Note: this excludes any time spent performing billable procedures or separate charges (such as time spent counseling smoking cessation); these charges are billed separately.  ? ?

## 2021-07-02 ENCOUNTER — Other Ambulatory Visit: Payer: Self-pay | Admitting: Adult Health

## 2021-07-02 DIAGNOSIS — B379 Candidiasis, unspecified: Secondary | ICD-10-CM

## 2021-07-27 DIAGNOSIS — G4733 Obstructive sleep apnea (adult) (pediatric): Secondary | ICD-10-CM | POA: Diagnosis not present

## 2021-07-28 ENCOUNTER — Telehealth: Payer: Medicare Other | Admitting: Adult Health

## 2021-08-02 ENCOUNTER — Other Ambulatory Visit: Payer: Self-pay | Admitting: Emergency Medicine

## 2021-08-23 ENCOUNTER — Other Ambulatory Visit: Payer: Self-pay | Admitting: Emergency Medicine

## 2021-08-26 DIAGNOSIS — G4733 Obstructive sleep apnea (adult) (pediatric): Secondary | ICD-10-CM | POA: Diagnosis not present

## 2021-08-28 ENCOUNTER — Other Ambulatory Visit: Payer: Self-pay | Admitting: Adult Health

## 2021-08-28 DIAGNOSIS — I10 Essential (primary) hypertension: Secondary | ICD-10-CM

## 2021-08-31 ENCOUNTER — Other Ambulatory Visit: Payer: Self-pay | Admitting: Emergency Medicine

## 2021-09-02 ENCOUNTER — Telehealth: Payer: Self-pay | Admitting: Adult Health

## 2021-09-02 ENCOUNTER — Ambulatory Visit (INDEPENDENT_AMBULATORY_CARE_PROVIDER_SITE_OTHER): Payer: Medicare Other | Admitting: Family Medicine

## 2021-09-02 ENCOUNTER — Encounter: Payer: Self-pay | Admitting: Family Medicine

## 2021-09-02 VITALS — BP 114/80 | HR 95 | Temp 98.5°F | Wt 306.0 lb

## 2021-09-02 DIAGNOSIS — B9689 Other specified bacterial agents as the cause of diseases classified elsewhere: Secondary | ICD-10-CM

## 2021-09-02 DIAGNOSIS — H109 Unspecified conjunctivitis: Secondary | ICD-10-CM | POA: Diagnosis not present

## 2021-09-02 MED ORDER — POLYMYXIN B-TRIMETHOPRIM 10000-0.1 UNIT/ML-% OP SOLN: 1.0000 [drp] | OPHTHALMIC | 0 refills | Status: AC

## 2021-09-02 NOTE — Progress Notes (Signed)
Subjective:    Patient ID: Billy [name], male    DOB: 12/23/1970, 51 y.o.   MRN: [id number]  Chief Complaint  Patient presents with   Eye Problem    Patient c/o eye irritation on L eye. Noticed it last week. Reported he was constantly rubbing his eyes.      HPI Patient was seen today for acute concern.  Pt endorses erythema and thick d/c in R eye causing irritation x 1-2 wks.  Pt constantly feeling need to rub eye.  Tried to pick d/c out of corner of eye, but feels like it made things worse, now more watery.  Also tried OTC eye drops without improvement.  L eye starting to have drainage.  Past Medical History:  Diagnosis Date   Allergy    as a child- none as adult    COPD (chronic obstructive pulmonary disease) ([place])    History of bronchitis    Hypertension    Obesity    Oxygen deficiency    Pt uses 02  3 liters with his CPAP ONLY    PFO (patent foramen ovale)    Polycythemia    a. Dr. Marin Olp.Marland KitchenMarland KitchenJAK-2 analysis is negative.  b. likely physiologic secondary to chronic hypoxia (? OSA + COPD + obesity).  c. s/p plebotomy x1   PVD (peripheral vascular disease) ([facility])    Sleep apnea    a. mod severe by sleep study 9.30.11: AHI 44.8 per hour; oxygen desat to a nadir of 65%; unsuccessful CPAP titration; significant hypoxemia even with supp o2 at 3LPM (dest to 70-80%); needs eval for cardiopulmonary disease and upper airway obstruction   Substance abuse ([facility])    alcoholism    Tobacco abuse     Allergies  Allergen Reactions   Ativan [Lorazepam] Other (See Comments)    Pt given Ativan.  Pt became unresponsive.    ROS General: Denies fever, chills, night sweats, changes in weight, changes in appetite HEENT: Denies headaches, ear pain, changes in vision, rhinorrhea, sore throat  +B/l eye drainage/d/c, irritation CV: Denies CP, palpitations, SOB, orthopnea Pulm: Denies SOB, cough, wheezing GI: Denies abdominal pain, nausea, vomiting, diarrhea, constipation GU: Denies dysuria,  hematuria, frequency Msk: Denies muscle cramps, joint pains Neuro: Denies weakness, numbness, tingling Skin: Denies rashes, bruising Psych: Denies depression, anxiety, hallucinations      Objective:    Blood pressure 114/80, pulse 95, temperature 98.5 F (36.9 C), temperature source Oral, weight (!) 306 lb (138.8 kg), SpO2 91 %.  Gen. Pleasant, well-nourished, in no distress, normal affect  HEENT: /AT, face symmetric, R conjunctival injection with mild edema of upper lid, 2 mm laceration in upper lid,thickened d/c in corner of R eye, scant in corner of L eye.  Mild conjunctival injection of left eye.  No scleral icterus, PERRLA, EOMI, nares patent without drainage Lungs: no accessory muscle use Neuro:  A&Ox3, CN II-XII intact, normal gait Skin:  Warm, no lesions/ rash.   Wt Readings from Last 3 Encounters:  09/02/21 (!) 306 lb (138.8 kg)  06/28/21 (!) 310 lb (140.6 kg)  06/10/21 (!) 302 lb 12.8 oz (137.3 kg)    Lab Results  Component Value Date   WBC 10.9 (H) 05/27/2020   HGB 15.3 05/27/2020   HCT 44.2 05/27/2020   PLT 175.0 05/27/2020   GLUCOSE 102 (H) 08/05/2019   CHOL 206 (H) 08/05/2019   TRIG 88.0 08/05/2019   HDL 58.70 08/05/2019   LDLCALC 130 (H) 08/05/2019   ALT 22 08/05/2019   AST  23 08/05/2019   NA 135 08/05/2019   K 4.6 08/05/2019   CL 98 08/05/2019   CREATININE 0.97 08/05/2019   BUN 11 08/05/2019   CO2 29 08/05/2019   TSH 1.96 08/05/2019   PSA 0.48 08/05/2019   INR 1.0 07/12/2018   HGBA1C 5.6 08/05/2019    Assessment/Plan:  Bacterial conjunctivitis of both eyes  -Advised to avoid rubbing eyes -cool compresses prn.  Gentle no tears baby shampoo can be used to cleanse eyelids. -start abx eye gtts.  Avoid touching tip of eyedrop bottle to eye. -Given handout -Given precautions - Plan: trimethoprim-polymyxin b (POLYTRIM) ophthalmic solution  F/u prn  Grier Mitts, MD

## 2021-09-06 NOTE — Telephone Encounter (Signed)
Please disregard

## 2021-09-23 ENCOUNTER — Other Ambulatory Visit: Payer: Self-pay | Admitting: Adult Health

## 2021-09-23 DIAGNOSIS — F39 Unspecified mood [affective] disorder: Secondary | ICD-10-CM

## 2021-09-26 DIAGNOSIS — G4733 Obstructive sleep apnea (adult) (pediatric): Secondary | ICD-10-CM | POA: Diagnosis not present

## 2021-10-21 ENCOUNTER — Encounter: Payer: Self-pay | Admitting: Pulmonary Disease

## 2021-10-21 ENCOUNTER — Ambulatory Visit (INDEPENDENT_AMBULATORY_CARE_PROVIDER_SITE_OTHER): Payer: Medicare Other | Admitting: Pulmonary Disease

## 2021-10-21 VITALS — BP 130/70 | HR 83 | Ht 73.0 in | Wt 311.0 lb

## 2021-10-21 DIAGNOSIS — G4733 Obstructive sleep apnea (adult) (pediatric): Secondary | ICD-10-CM

## 2021-10-21 DIAGNOSIS — R0602 Shortness of breath: Secondary | ICD-10-CM

## 2021-10-21 NOTE — Patient Instructions (Signed)
Pulmonary function test at next visit  Continue weight loss efforts  Continue inhalers  Continue CPAP at night  Your CPAP shows that it is working well at present  DME prescription for CPAP supplies  I will see you back in 6 months

## 2021-10-21 NOTE — Progress Notes (Signed)
Hashim Eichhorst    [id number]    1970/12/10  Primary Care Physician:Nafziger, Tommi Rumps, NP  Referring Physician: Dorothyann Peng, NP [address] [place],  [place] [address]  Chief complaint:   Patient with a history of obstructive sleep apnea, chronic obstructive pulmonary disease  HPI:  Tolerating CPAP better Tries to use it every night  Weight is up a little bit from previous  Has been trying to get more active to try and drop weight  Denies any chest pains or chest discomfort Does get winded with some regular activities  Not having significant difficulty with CPAP He is more concerned today about his shortness of breath with activities Currently on Symbicort which he uses regularly, rescue inhaler use as needed  Discussed inspire device during his last visit-his weight will preclude this -Is going to be committed to trying to drop some weight  Pathophysiology of sleep disordered breathing discussed with the patient  Usually goes to bed about 9:30-12 Falls asleep easily about 3 awakening Final wake up time about 6:30 in the morning  Quit smoking about 3 years ago He is known to have chronic obstructive pulmonary disease -Compliant with inhalers  Outpatient Encounter Medications as of 10/21/2021  Medication Sig   albuterol (VENTOLIN HFA) 108 (90 Base) MCG/ACT inhaler Inhale 1-2 puffs into the lungs every 6 (six) hours as needed for wheezing or shortness of breath.   SYMBICORT 160-4.5 MCG/ACT inhaler TAKE 2 PUFFS BY MOUTH TWICE A DAY   aspirin 81 MG tablet Take 2 tablets (161 mg total) by mouth daily.   buPROPion (WELLBUTRIN XL) 150 MG 24 hr tablet TAKE 1 TABLET BY MOUTH EVERY DAY   traMADol (ULTRAM) 50 MG tablet Take 2 tablets (100 mg total) by mouth every 6 (six) hours as needed.   valsartan (DIOVAN) 160 MG tablet TAKE 1 TABLET BY MOUTH EVERY DAY   No facility-administered encounter medications on file as of 10/21/2021.    Allergies as of  10/21/2021 - Review Complete 10/21/2021  Allergen Reaction Noted   Ativan [lorazepam] Other (See Comments) 04/29/2018    Past Medical History:  Diagnosis Date   Allergy    as a child- none as adult    COPD (chronic obstructive pulmonary disease) ([place])    History of bronchitis    Hypertension    Obesity    Oxygen deficiency    Pt uses 02  3 liters with his CPAP ONLY    PFO (patent foramen ovale)    Polycythemia    a. Dr. Marin Olp.Marland KitchenMarland KitchenJAK-2 analysis is negative.  b. likely physiologic secondary to chronic hypoxia (? OSA + COPD + obesity).  c. s/p plebotomy x1   PVD (peripheral vascular disease) (HCC)    Sleep apnea    a. mod severe by sleep study 9.30.11: AHI 44.8 per hour; oxygen desat to a nadir of 65%; unsuccessful CPAP titration; significant hypoxemia even with supp o2 at 3LPM (dest to 70-80%); needs eval for cardiopulmonary disease and upper airway obstruction   Substance abuse ([place])    alcoholism    Tobacco abuse     Past Surgical History:  Procedure Laterality Date   knee injury  [place]     as child     Family History  Problem Relation Age of Onset   Other Mother        Wegener's Disease   Lung cancer Paternal Grandmother  Drug abuse Father    Alcohol abuse Father    Asthma Father    Colon cancer Neg Hx    Colon polyps Neg Hx    Esophageal cancer Neg Hx    Rectal cancer Neg Hx    Stomach cancer Neg Hx     Social History   Socioeconomic History   Marital status: Divorced    Spouse name: Not on file   Number of children: 2   Years of education: Not on file   Highest education level: Not on file  Occupational History   Occupation: unemployed  Tobacco Use   Smoking status: Former    Packs/day: 0.50    Years: 25.00    Total pack years: 12.50    Types: Cigarettes    Quit date: 03/21/2018    Years since quitting: 3.5   Smokeless tobacco: Never  Vaping Use   Vaping Use: Former  Substance and Sexual  Activity   Alcohol use: Yes    Alcohol/week: 76.0 standard drinks of alcohol    Types: 36 Cans of beer, 40 Shots of liquor per week   Drug use: Yes    Frequency: 1.0 times per week    Types: Marijuana    Comment: marajuana...also valium not prescribed   Sexual activity: Not Currently  Other Topics Concern   Not on file  Social History Narrative   05/01/2018:   Lives with mother on one level   Has two 73yo sons who live nearby   Enjoys hunting/fishing   Since being hospitalized for COPD exacerbation, has quit smoking, and is motivated to do aerobic exercise   Heavy alcohol consumer, does not see it as a problem, no interest in reducing alcohol intake.   Social Determinants of Health   Financial Resource Strain: Low Risk  (11/10/2020)   Overall Financial Resource Strain (CARDIA)    Difficulty of Paying Living Expenses: Not hard at all  Food Insecurity: No Food Insecurity (11/10/2020)   Hunger Vital Sign    Worried About Running Out of Food in the Last Year: Never true    Ran Out of Food in the Last Year: Never true  Transportation Needs: No Transportation Needs (11/10/2020)   PRAPARE - Hydrologist (Medical): No    Lack of Transportation (Non-Medical): No  Physical Activity: Insufficiently Active (11/10/2020)   Exercise Vital Sign    Days of Exercise per Week: 2 days    Minutes of Exercise per Session: 30 min  Stress: No Stress Concern Present (11/10/2020)   [place]    Feeling of Stress : Only a little  Social Connections: Socially Isolated (11/10/2020)   Social Connection and Isolation Panel [NHANES]    Frequency of Communication with Friends and Family: More than three times a week    Frequency of Social Gatherings with Friends and Family: More than three times a week    Attends Religious Services: Never    Marine scientist or Organizations: No    Attends Archivist  Meetings: Never    Marital Status: Divorced  Human resources officer Violence: Not At Risk (11/10/2020)   Humiliation, Afraid, Rape, and Kick questionnaire    Fear of Current or Ex-Partner: No    Emotionally Abused: No    Physically Abused: No    Sexually Abused: No    Review of Systems  Constitutional:  Negative for fatigue.  Respiratory:  Positive for apnea. Negative for  shortness of breath.   Psychiatric/Behavioral:  Positive for sleep disturbance.     Vitals:   10/21/21 1038  BP: 130/70  Pulse: 83  SpO2: 96%      Physical Exam Constitutional:      Appearance: He is obese.  HENT:     Head: Normocephalic.     Mouth/Throat:     Mouth: Mucous membranes are moist.     Comments: Mallampati 4, crowded oropharynx, macroglossia Cardiovascular:     Heart sounds: No murmur heard.    No friction rub.  Pulmonary:     Effort: No respiratory distress.     Breath sounds: No stridor. No wheezing or rhonchi.  Musculoskeletal:     Cervical back: No rigidity or tenderness.  Neurological:     Mental Status: He is alert.  Psychiatric:        Mood and Affect: Mood normal.      12/31/2020   11:00 AM  Results of the Epworth flowsheet  Sitting and reading 2  Watching TV 2  Sitting, inactive in a public place (e.g. a theatre or a meeting) 1  As a passenger in a car for an hour without a break 1  Lying down to rest in the afternoon when circumstances permit 2  Sitting and talking to someone 0  Sitting quietly after a lunch without alcohol 1  In a car, while stopped for a few minutes in traffic 0  Total score 9     Data Reviewed: Compliance data reveals 97% compliance Average use of 6 hours 25 minutes Machine set between 5 and 20 95 percentile pressure of 11.2 Residual AHI of 3.5   Assessment:  Patient with a history of obstructive sleep apnea -Encouraged to continue using CPAP on a regular basis  He has chronic obstructive pulmonary disease -Continues on  bronchodilators  -Appears to be tolerating CPAP much better than previous  The importance of weight loss efforts discussed with patient -He will continue to work on weight loss -He is going to start walking on a regular basis  Plan/Recommendations: Encouraged to use CPAP on a nightly basis  DME referral for mask supplies  Continue current inhalers  Pulmonary function test at next visit  I will see him back in 6 months  I encouraged him to call with any significant concerns   Sherrilyn Rist MD [facility] 10/21/2021, 10:47 AM  CC: Dorothyann Peng, NP

## 2021-10-27 DIAGNOSIS — G4733 Obstructive sleep apnea (adult) (pediatric): Secondary | ICD-10-CM | POA: Diagnosis not present

## 2021-11-11 DIAGNOSIS — G4733 Obstructive sleep apnea (adult) (pediatric): Secondary | ICD-10-CM | POA: Diagnosis not present

## 2021-11-15 ENCOUNTER — Telehealth: Payer: Self-pay | Admitting: Pharmacist

## 2021-11-15 NOTE — Chronic Care Management (AMB) (Signed)
Chronic Care Management Pharmacy Assistant   Name: Billy [name]  MRN: [id number] DOB: 08/15/1970  Reason for Encounter: Disease State / Hypertension and COPD Assessment Call   Conditions to be addressed/monitored: HTN and COPD  Recent office visits:  09/02/2021 Billy Mitts MD - Patient was seen for bacterial conjunctivitis of both eyes. Started Polytrim. Discontinued Lamisil. Follow up if symptoms worsen or fail to improve.  06/28/2021 Billy Peng NP - Patient was seen for mood disorder. Started Bupropion 150 mg daily. Discontinued Lisinopril. No follow up noted.   05/26/2021 Billy Peng NP - Patient was seen for rash of penis. Discontinued Doxycycline and Tebinafine. No follow up noted.   05/13/2021 Billy Peng NP - Patient was seen for candida infection. Started Doxycycline 100 mg twice daily and Terbinafine 250 mg daily x 2 weeks. No follow up noted.   Recent consult visits:  10/21/2021 Billy Rist MD (pulmonology) - Patient was seen for  Obstructive sleep apnea and SOB. No medication changes. Follow up in 6 months.   06/10/2021 Billy Apo MD (pulmonology) - Patient was seen for Obstructive sleep apnea. Started Valsartan 160 mg daily. Follow up in 6 months.   06/09/2021 Billy [name] (Dermatology) - Patient was seen for dermatitis. No additional chart notes.   Hospital visits:  None  Medications: Outpatient Encounter Medications as of 11/15/2021  Medication Sig   albuterol (VENTOLIN HFA) 108 (90 Base) MCG/ACT inhaler Inhale 1-2 puffs into the lungs every 6 (six) hours as needed for wheezing or shortness of breath.   aspirin 81 MG tablet Take 2 tablets (161 mg total) by mouth daily.   buPROPion (WELLBUTRIN XL) 150 MG 24 hr tablet TAKE 1 TABLET BY MOUTH EVERY DAY   SYMBICORT 160-4.5 MCG/ACT inhaler TAKE 2 PUFFS BY MOUTH TWICE A DAY   traMADol (ULTRAM) 50 MG tablet Take 2 tablets (100 mg total) by mouth every 6 (six) hours as needed.   valsartan (DIOVAN)  160 MG tablet TAKE 1 TABLET BY MOUTH EVERY DAY   No facility-administered encounter medications on file as of 11/15/2021.  Fill History:  VALSARTAN 160 MG TABLET 10/25/2021 30   TRAMADOL HCL 50 MG TABLET 05/28/2020 7   BUPROPION HCL XL 150 MG TABLET 09/23/2021 90   ALBUTEROL HFA 90 MCG INHALER 08/27/2021 25   Reviewed chart prior to disease state call. Spoke with patient regarding BP  Recent Office Vitals: BP Readings from Last 3 Encounters:  10/21/21 130/70  09/02/21 114/80  06/28/21 122/80   Pulse Readings from Last 3 Encounters:  10/21/21 83  09/02/21 95  06/28/21 86    Wt Readings from Last 3 Encounters:  10/21/21 (!) 311 lb (141.1 kg)  09/02/21 (!) 306 lb (138.8 kg)  06/28/21 (!) 310 lb (140.6 kg)     Kidney Function Lab Results  Component Value Date/Time   CREATININE 0.97 08/05/2019 10:15 AM   CREATININE 1.00 01/01/2019 04:08 PM   CREATININE 1.30 07/12/2018 04:02 PM   CREATININE 0.9 12/16/2014 02:07 PM   CREATININE 0.8 12/08/2013 08:40 AM   GFR 81.94 08/05/2019 10:15 AM   GFRNONAA >60 03/26/2018 03:15 AM   GFRAA >60 03/26/2018 03:15 AM       Latest Ref Rng & Units 08/05/2019   10:15 AM 01/01/2019    4:08 PM 07/12/2018    4:02 PM  BMP  Glucose 70 - 99 mg/dL 102  106  96   BUN 6 - 23 mg/dL '11  10  21   '$ Creatinine 0.40 -  1.50 mg/dL 0.97  1.00  1.30   BUN/Creat Ratio 6 - 22 (calc)   NOT APPLICABLE   Sodium 209 - 145 mEq/L 135  139  139   Potassium 3.5 - 5.1 mEq/L 4.6  4.2  4.4   Chloride 96 - 112 mEq/L 98  104  104   CO2 19 - 32 mEq/L '29  26  27   '$ Calcium 8.4 - 10.5 mg/dL 9.6  9.5  9.6    Current COPD regimen:          Symbicort 160/4.5 two puffs twice daily          Ventolin HFA 1-2 puffs every 6 hours as needed  Any recent hospitalizations or ED visits since last visit with CPP? No recent hospital visits.  Reports denies COPD symptoms  What recent interventions/DTPs have been made by any provider to improve breathing since last visit: No recent  interventions  Have you had exacerbation/flare-up since last visit? Patient denies  What do you do when you are short of breath?  Patient states he will use his rescue inhaler.   Respiratory Devices/Equipment Do you have a nebulizer? No Do you use a Peak Flow Meter? No Do you use a maintenance inhaler? Patient uses a maintenance inhaler How often do you forget to use your daily inhaler? Patient states he never forgets to use this.  Do you use a rescue inhaler? Yes How often do you use your rescue inhaler?  Patient uses his rescue inhaler 1-2 times per week up to 1-2 times per day depending on what he is doing or the environment around him.  Do you use a spacer with your inhaler? No  Adherence Review: Does the patient have >5 day gap between last estimated fill date for maintenance inhaler medications? No  Current antihypertensive regimen:  Valsartan 160 mg daily  How often are you checking your Blood Pressure? Patient states recently he has been checking his blood pressure once every week or two.   Current home BP readings: Patient doesn't have a log of his readings, he states it is always good around 110/70.  What recent interventions/DTPs have been made by any provider to improve Blood Pressure control since last CPP Visit: No recent intervention  Any recent hospitalizations or ED visits since last visit with CPP? No recent hospitalizations.   What diet changes have been made to improve Blood Pressure Control?  Patient follows no specific diet Breakfast - patient will have a pack of nabs and a banana Lunch - patient will have a sandwich Dinner - patient will meat, vegetable and very little bread or starch  What exercise is being done to improve your Blood Pressure Control?  Patient has been golfing often recently   Adherence Review: Is the patient currently on ACE/ARB medication? Yes Does the patient have >5 day gap between last estimated fill dates? No  Care Gaps: AWV -  scheduled 11/23/2021 Last BP - 130/70 on 10/20/2021 Covid booster - never done Tdap - never done Shingrix - never done  Star Rating Drugs: Valsartan 160 mg - last filled 10/25/2021 30 DS at [facility] Pharmacist Assistant [phone]

## 2021-11-18 ENCOUNTER — Other Ambulatory Visit: Payer: Self-pay | Admitting: Emergency Medicine

## 2021-11-23 ENCOUNTER — Ambulatory Visit: Payer: Medicare Other

## 2021-11-24 ENCOUNTER — Telehealth: Payer: Self-pay | Admitting: Emergency Medicine

## 2021-11-25 MED ORDER — VALSARTAN 160 MG PO TABS: 160.0000 mg | ORAL_TABLET | Freq: Every day | ORAL | 0 refills | Status: DC

## 2021-11-25 NOTE — Telephone Encounter (Signed)
Called and spoke with patient, he states that he was changed from Lisinopril to Valsartan by Dr. Lamonte Sakai because of the dry cough.  He is requesting a refill.  I advised him that blood pressure medications are general managed by their PCP.  I advised him I would send a message to Dr. Lamonte Sakai to see how he wants to manage the Valsartan going forward.  Refilled the medication for 30 days so patient does not run out.  Dr. Lamonte Sakai,  How would you like to have the valsartan managed going forward?  Please advise.  Thank you.

## 2021-11-26 DIAGNOSIS — G4733 Obstructive sleep apnea (adult) (pediatric): Secondary | ICD-10-CM | POA: Diagnosis not present

## 2021-11-26 NOTE — Telephone Encounter (Signed)
If the valsartan has worked for him, then I am ok with filling it for a year, and then having his PCP pick up the refills after that

## 2021-11-28 MED ORDER — VALSARTAN 160 MG PO TABS: 160.0000 mg | ORAL_TABLET | Freq: Every day | ORAL | 11 refills | Status: DC

## 2021-11-28 NOTE — Telephone Encounter (Signed)
Refill was sent in and verified by pt. Nothing further needed at this time.

## 2021-11-30 ENCOUNTER — Telehealth: Payer: Self-pay

## 2021-11-30 ENCOUNTER — Ambulatory Visit: Payer: Medicare Other

## 2021-11-30 NOTE — Telephone Encounter (Signed)
Contacted patient on preferred number listed in notes for scheduled AWV. Patient stated unable to complete visit at this time will call back and reschedule.

## 2021-12-14 ENCOUNTER — Telehealth: Payer: Self-pay

## 2021-12-14 NOTE — Telephone Encounter (Signed)
Unsuccessful attempt to reach patient on preferred number listed in notes for scheduled AWV. Unable to leave message on voicemail. 

## 2021-12-20 ENCOUNTER — Encounter (INDEPENDENT_AMBULATORY_CARE_PROVIDER_SITE_OTHER): Payer: Self-pay | Admitting: Internal Medicine

## 2021-12-20 ENCOUNTER — Ambulatory Visit (INDEPENDENT_AMBULATORY_CARE_PROVIDER_SITE_OTHER): Payer: Medicare Other | Admitting: Internal Medicine

## 2021-12-20 VITALS — BP 109/73 | HR 77 | Temp 98.0°F | Ht 73.0 in | Wt 308.6 lb

## 2021-12-20 DIAGNOSIS — Z0289 Encounter for other administrative examinations: Secondary | ICD-10-CM

## 2021-12-20 DIAGNOSIS — Z6841 Body Mass Index (BMI) 40.0 and over, adult: Secondary | ICD-10-CM

## 2021-12-20 DIAGNOSIS — G4733 Obstructive sleep apnea (adult) (pediatric): Secondary | ICD-10-CM | POA: Diagnosis not present

## 2021-12-20 DIAGNOSIS — I10 Essential (primary) hypertension: Secondary | ICD-10-CM

## 2021-12-20 NOTE — Progress Notes (Signed)
Office: [phone]  /  Fax: [phone]   Initial Visit  Billy [name] was seen in clinic today to evaluate for obesity. He is interested in losing weight to improve overall health and reduce the risk of weight related complications. He presents today to review program treatment options, initial physical assessment, and evaluation.     He was referred by: Friend or Family  When asked what else they would like to accomplish? He states: Adopt healthier eating patterns, Improve existing medical conditions, and Improve quality of life  When asked how has your weight affected you? He states: Contributed to medical problems, Contributed to orthopedic problems or mobility issues, and Problems with eating patterns  Some associated conditions: Hypertension, Hyperlipidemia, OSA, and Lung disease  Contributing factors: Family history, Disruption of circadian rhythm, Nutritional, Medications, and Eating patterns  Weight promoting medications identified: Steroids  Current nutrition plan: None  Current level of physical activity: None  Current or previous pharmacotherapy: None  Response to medication: Never tried medications   Past medical history includes:   Past Medical History:  Diagnosis Date   Allergy    as a child- none as adult    COPD (chronic obstructive pulmonary disease) ([place])    History of bronchitis    Hypertension    Obesity    Oxygen deficiency    Pt uses 02  3 liters with his CPAP ONLY    PFO (patent foramen ovale)    Polycythemia    a. Dr. Marin Olp.Marland KitchenMarland KitchenJAK-2 analysis is negative.  b. likely physiologic secondary to chronic hypoxia (? OSA + COPD + obesity).  c. s/p plebotomy x1   PVD (peripheral vascular disease) ([facility])    Sleep apnea    a. mod severe by sleep study 9.30.11: AHI 44.8 per hour; oxygen desat to a nadir of 65%; unsuccessful CPAP titration; significant hypoxemia even with supp o2 at 3LPM (dest to 70-80%); needs eval for cardiopulmonary disease and  upper airway obstruction   Substance abuse ([facility])    alcoholism    Tobacco abuse      Objective:   BP 109/73   Pulse 77   Temp 98 F (36.7 C)   Ht '6\' 1"'$  (1.854 m)   Wt (!) 308 lb 9.6 oz (140 kg)   SpO2 96%   BMI 40.71 kg/m  He was weighed on the bioimpedance scale: Body mass index is 40.71 kg/m.  Peak Weight:339 ,Visceral Fat Rating:23, Body Fat%:38.2, Weight trend over the last 12 months: Decreasing  General:  Alert, oriented and cooperative. Patient is in no acute distress.  Respiratory: Normal respiratory effort, no problems with respiration noted  Extremities: Normal range of motion.    Mental Status: Normal mood and affect. Normal behavior. Normal judgment and thought content.   Assessment and Plan:  1. Class 3 severe obesity with serious comorbidity and body mass index (BMI) of 40.0 to 44.9 in adult, unspecified obesity type ([place]) We reviewed weight, biometrics, associated medical conditions and contributing factors with patient. He would benefit from weight loss therapy via a modified calorie, low-carb, high-protein nutritional plan tailored to their REE (resting energy expenditure) which will be determined by indirect calorimetry.  We will also assess for cardiometabolic risk and nutritional derangements via fasting serologies at his next appointment.   2. Obstructive sleep apnea On CPAP therapy with good compliance.  This may improve with weight loss therapy in the range of 15% of body weight.  3. Primary hypertension Blood pressure well controlled on valsartan.  No  side effects continue current medication.  Blood pressure goal less than 120/80.    Obesity Treatment / Action Plan:  Patient will work on garnering support from family and friends to begin weight loss journey. Will work on eliminating or reducing the presence of highly palatable, calorie dense foods in the home. Will complete provided nutritional and psychosocial assessment questionnaire before the next  appointment. Will be scheduled for indirect calorimetry to determine resting energy expenditure in a fasting state.  This will allow [place] to create a reduced calorie, high-protein meal plan to promote loss of fat mass while preserving muscle mass. Will work on reducing intake of added sugars, simple sugars and processed carbs. Will reduce liquid calories and sugary drinks from diet. Was counseled on nutritional approaches to weight loss and benefits of complex carbs and high quality protein as part of nutritional weight management. Was counseled on pharmacotherapy and role as an adjunct in weight management.   Obesity Education Performed Today:  He was weighed on the bioimpedance scale and results were discussed and documented in the synopsis.  We discussed obesity as a disease and the importance of a more detailed evaluation of all the factors contributing to the disease.  We discussed the importance of long term lifestyle changes which include nutrition, exercise and behavioral modifications as well as the importance of customizing this to his specific health and social needs.  We discussed the benefits of reaching a healthier weight to alleviate the symptoms of existing conditions and reduce the risks of the biomechanical, metabolic and psychological effects of obesity.  Abhiraj Dozal appears to be in the action stage of change and states they are ready to start intensive lifestyle modifications and behavioral modifications.  30 minutes was spent today on this visit including the above counseling, pre-visit chart review, and post-visit documentation.  Reviewed by clinician on day of visit: allergies, medications, problem list, medical history, surgical history, family history, social history, and previous encounter notes.  [name], MD

## 2021-12-27 ENCOUNTER — Telehealth: Payer: Self-pay | Admitting: Pulmonary Disease

## 2021-12-27 ENCOUNTER — Ambulatory Visit (INDEPENDENT_AMBULATORY_CARE_PROVIDER_SITE_OTHER): Payer: Medicare Other

## 2021-12-27 VITALS — Ht 73.0 in | Wt 308.0 lb

## 2021-12-27 DIAGNOSIS — Z0000 Encounter for general adult medical examination without abnormal findings: Secondary | ICD-10-CM | POA: Diagnosis not present

## 2021-12-27 DIAGNOSIS — G4733 Obstructive sleep apnea (adult) (pediatric): Secondary | ICD-10-CM | POA: Diagnosis not present

## 2021-12-27 MED ORDER — SPIRIVA RESPIMAT 2.5 MCG/ACT IN AERS: 2.0000 | INHALATION_SPRAY | Freq: Every day | RESPIRATORY_TRACT | 5 refills | Status: DC

## 2021-12-27 NOTE — Telephone Encounter (Signed)
Called patient and he states in the past he was on sprivia and he got off of it due to him being better. But he states he is stating to have a slight wheeze again and is wanting to go back on the Sprivia if possible.   Please advise sir  Pt is already on Symbicort daily and Albuterol as needed   Please advise

## 2021-12-27 NOTE — Telephone Encounter (Signed)
Spoke with pt and verified pharmacy. Sent Spiriva order. Nothing further needed at this time.

## 2021-12-27 NOTE — Patient Instructions (Addendum)
[name] , Thank you for taking time to come for your Medicare Wellness Visit. I appreciate your ongoing commitment to your health goals. Please review the following plan we discussed and let me know if I can assist you in the future.   These are the goals we discussed:  Goals       Patient Stated      Improve breathing, do pulmonary rehab and start swimming/going to [employer]      Patient Stated      Lose weight      Pharmacy Care Plan      Current Barriers:  Chronic Disease Management support, education, and care coordination needs related to HTN, COPD, and History of tobacco use, Alcohol use, Obstructive sleep apnea, Polycythemia   Pharmacist Clinical Goal(s):  Maintain Blood pressure <130/80 mmHg  Continue working on lifestyle modifications (diet/exercise). Stay active, engage in at least 150 minutes per week of moderate-intensity exercise such as brisk walking (15- to 20-minute mile) or something similar.  Continue with diet modifications (cutting back in sweets, portion control).   Interventions: Comprehensive medication review performed. Discussed diet and exercise modifications and effect on blood pressure.  Discussed lifestyle modifications. (include nuts, legumes, vegetables, fruits in diet; aim for 30 minutes three times a week of physical activity).  Patient Self Care Activities:  Calls provider office for new concerns or questions Continue current medications as directed by providers.  Continue at home blood pressure readings.  Initial goal documentation       Weight (lb) < 200 lb (90.7 kg) (pt-stated)      I want to lose about 90lbs within a year,        This is a list of the screening recommended for you and due dates:  Health Maintenance  Topic Date Due   COVID-19 Vaccine (1) 01/12/2022*   Zoster (Shingles) Vaccine (1 of 2) 03/29/2022*   Tetanus Vaccine  12/28/2022*   Medicare Annual Wellness Visit  12/28/2022   Colon Cancer Screening   10/08/2029   Hepatitis C Screening: USPSTF Recommendation to screen - Ages 18-79 yo.  Completed   HIV Screening  Completed   HPV Vaccine  Aged Out  *Topic was postponed. The date shown is not the original due date.    Advanced directives: Advance directive discussed with you today. Even though you declined this today, please call our office should you change your mind, and we can give you the proper paperwork for you to fill out.   Conditions/risks identified: None  Next appointment: Follow up in one year for your annual wellness visit   Preventive Care 40-64 Years, Male Preventive care refers to lifestyle choices and visits with your health care provider that can promote health and wellness. What does preventive care include? A yearly physical exam. This is also called an annual well check. Dental exams once or twice a year. Routine eye exams. Ask your health care provider how often you should have your eyes checked. Personal lifestyle choices, including: Daily care of your teeth and gums. Regular physical activity. Eating a healthy diet. Avoiding tobacco and drug use. Limiting alcohol use. Practicing safe sex. Taking low-dose aspirin every day starting at age 41. What happens during an annual well check? The services and screenings done by your health care provider during your annual well check will depend on your age, overall health, lifestyle risk factors, and family history of disease. Counseling  Your health care provider may ask you questions about your: Alcohol use.  Tobacco use. Drug use. Emotional well-being. Home and relationship well-being. Sexual activity. Eating habits. Work and work Statistician. Screening  You may have the following tests or measurements: Height, weight, and BMI. Blood pressure. Lipid and cholesterol levels. These may be checked every 5 years, or more frequently if you are over 72 years old. Skin check. Lung cancer screening. You may have  this screening every year starting at age 43 if you have a 30-pack-year history of smoking and currently smoke or have quit within the past 15 years. Fecal occult blood test (FOBT) of the stool. You may have this test every year starting at age 39. Flexible sigmoidoscopy or colonoscopy. You may have a sigmoidoscopy every 5 years or a colonoscopy every 10 years starting at age 53. Prostate cancer screening. Recommendations will vary depending on your family history and other risks. Hepatitis C blood test. Hepatitis B blood test. Sexually transmitted disease (STD) testing. Diabetes screening. This is done by checking your blood sugar (glucose) after you have not eaten for a while (fasting). You may have this done every 1-3 years. Discuss your test results, treatment options, and if necessary, the need for more tests with your health care provider. Vaccines  Your health care provider may recommend certain vaccines, such as: Influenza vaccine. This is recommended every year. Tetanus, diphtheria, and acellular pertussis (Tdap, Td) vaccine. You may need a Td booster every 10 years. Zoster vaccine. You may need this after age 62. Pneumococcal 13-valent conjugate (PCV13) vaccine. You may need this if you have certain conditions and have not been vaccinated. Pneumococcal polysaccharide (PPSV23) vaccine. You may need one or two doses if you smoke cigarettes or if you have certain conditions. Talk to your health care provider about which screenings and vaccines you need and how often you need them. This information is not intended to replace advice given to you by your health care provider. Make sure you discuss any questions you have with your health care provider. Document Released: 03/12/2015 Document Revised: 11/03/2015 Document Reviewed: 12/15/2014 Elsevier Interactive Patient Education  2017 [place] Prevention in the Home Falls can cause injuries. They can happen to people of all ages.  There are many things you can do to make your home safe and to help prevent falls. What can I do on the outside of my home? Regularly fix the edges of walkways and driveways and fix any cracks. Remove anything that might make you trip as you walk through a door, such as a raised step or threshold. Trim any bushes or trees on the path to your home. Use bright outdoor lighting. Clear any walking paths of anything that might make someone trip, such as rocks or tools. Regularly check to see if handrails are loose or broken. Make sure that both sides of any steps have handrails. Any raised decks and porches should have guardrails on the edges. Have any leaves, snow, or ice cleared regularly. Use sand or salt on walking paths during winter. Clean up any spills in your garage right away. This includes oil or grease spills. What can I do in the bathroom? Use night lights. Install grab bars by the toilet and in the tub and shower. Do not use towel bars as grab bars. Use non-skid mats or decals in the tub or shower. If you need to sit down in the shower, use a plastic, non-slip stool. Keep the floor dry. Clean up any water that spills on the floor as soon as it happens.  Remove soap buildup in the tub or shower regularly. Attach bath mats securely with double-sided non-slip rug tape. Do not have throw rugs and other things on the floor that can make you trip. What can I do in the bedroom? Use night lights. Make sure that you have a light by your bed that is easy to reach. Do not use any sheets or blankets that are too big for your bed. They should not hang down onto the floor. Have a firm chair that has side arms. You can use this for support while you get dressed. Do not have throw rugs and other things on the floor that can make you trip. What can I do in the kitchen? Clean up any spills right away. Avoid walking on wet floors. Keep items that you use a lot in easy-to-reach places. If you need  to reach something above you, use a strong step stool that has a grab bar. Keep electrical cords out of the way. Do not use floor polish or wax that makes floors slippery. If you must use wax, use non-skid floor wax. Do not have throw rugs and other things on the floor that can make you trip. What can I do with my stairs? Do not leave any items on the stairs. Make sure that there are handrails on both sides of the stairs and use them. Fix handrails that are broken or loose. Make sure that handrails are as long as the stairways. Check any carpeting to make sure that it is firmly attached to the stairs. Fix any carpet that is loose or worn. Avoid having throw rugs at the top or bottom of the stairs. If you do have throw rugs, attach them to the floor with carpet tape. Make sure that you have a light switch at the top of the stairs and the bottom of the stairs. If you do not have them, ask someone to add them for you. What else can I do to help prevent falls? Wear shoes that: Do not have high heels. Have rubber bottoms. Are comfortable and fit you well. Are closed at the toe. Do not wear sandals. If you use a stepladder: Make sure that it is fully opened. Do not climb a closed stepladder. Make sure that both sides of the stepladder are locked into place. Ask someone to hold it for you, if possible. Clearly [place] and make sure that you can see: Any grab bars or handrails. First and last steps. Where the edge of each step is. Use tools that help you move around (mobility aids) if they are needed. These include: Canes. Walkers. Scooters. Crutches. Turn on the lights when you go into a dark area. Replace any light bulbs as soon as they burn out. Set up your furniture so you have a clear path. Avoid moving your furniture around. If any of your floors are uneven, fix them. If there are any pets around you, be aware of where they are. Review your medicines with your doctor. Some medicines can  make you feel dizzy. This can increase your chance of falling. Ask your doctor what other things that you can do to help prevent falls. This information is not intended to replace advice given to you by your health care provider. Make sure you discuss any questions you have with your health care provider. Document Released: 12/10/2008 Document Revised: 07/22/2015 Document Reviewed: 03/20/2014 Elsevier Interactive Patient Education  2017 [employer].

## 2021-12-27 NOTE — Telephone Encounter (Signed)
Yes - would be in favor of restarting 2.5. Would get either Respimat or Handi-haler, whichever is more cost effective for him.

## 2021-12-27 NOTE — Progress Notes (Signed)
Subjective:   Billy [name] is a 51 y.o. male who presents for Medicare Annual/Subsequent preventive examination.  Review of Systems    Virtual Visit via Telephone Note  I connected with  [name] on 12/27/21 at  1:30 PM EDT by telephone and verified that I am speaking with the correct person using two identifiers.  Location: Patient: Home Provider: Office Persons participating in the virtual visit: patient/Nurse Health Advisor   I discussed the limitations, risks, security and privacy concerns of performing an evaluation and management service by telephone and the availability of in person appointments. The patient expressed understanding and agreed to proceed.  Interactive audio and video telecommunications were attempted between this nurse and patient, however failed, due to patient having technical difficulties OR patient did not have access to video capability.  We continued and completed visit with audio only.  Some vital signs may be absent or patient reported.   Billy Peaches, LPN  Cardiac Risk Factors include: advanced age (>34mn, >>49women);male gender;obesity (BMI >30kg/m2)     Objective:    Today's Vitals   12/27/21 1320  Weight: (!) 308 lb (139.7 kg)  Height: '6\' 1"'$  (1.854 m)   Body mass index is 40.64 kg/m.     12/27/2021    1:29 PM 11/10/2020    2:44 PM 11/07/2019    9:51 AM 05/01/2018    2:04 PM 04/29/2018    2:30 PM 03/22/2018    3:20 PM 03/22/2018    8:41 AM  Advanced Directives  Does Patient Have a Medical Advance Directive? No No No No No No No  Does patient want to make changes to medical advance directive?   No - Patient declined      Would patient like information on creating a medical advance directive? No - Patient declined No - Patient declined  No - Patient declined No - Patient declined No - Patient declined     Current Medications (verified) Outpatient Encounter Medications as of 12/27/2021  Medication Sig   albuterol  (VENTOLIN HFA) 108 (90 Base) MCG/ACT inhaler Inhale 1-2 puffs into the lungs every 6 (six) hours as needed for wheezing or shortness of breath.   aspirin 81 MG tablet Take 2 tablets (161 mg total) by mouth daily.   SYMBICORT 160-4.5 MCG/ACT inhaler TAKE 2 PUFFS BY MOUTH TWICE A DAY   valsartan (DIOVAN) 160 MG tablet Take 1 tablet (160 mg total) by mouth daily.   No facility-administered encounter medications on file as of 12/27/2021.    Allergies (verified) Ativan [lorazepam]   History: Past Medical History:  Diagnosis Date   Allergy    as a child- none as adult    [place]    History of bronchitis    Hypertension    Obesity    Oxygen deficiency    Pt uses 02  3 liters with his CPAP ONLY    PFO (patent foramen ovale)    Polycythemia    a. Dr. EMarin Olp.Marland KitchenMarland KitchenAK-2 analysis is negative.  b. likely physiologic secondary to chronic hypoxia (? OSA + COPD + obesity).  c. s/p plebotomy x1   PVD (peripheral vascular disease) (HCC)    Sleep apnea    a. mod severe by sleep study 9.30.11: AHI 44.8 per hour; oxygen desat to a nadir of 65%; unsuccessful CPAP titration; significant hypoxemia even with supp o2 at 3LPM (dest to 70-80%); needs eval for cardiopulmonary disease and upper airway obstruction   Substance abuse (  [place])    alcoholism    Tobacco abuse    Past Surgical History:  Procedure Laterality Date   knee injury  [place]     as child    Family History  Problem Relation Age of Onset   Other Mother        Wegener's Disease   Lung cancer Paternal Grandmother    Drug abuse Father    Alcohol abuse Father    Asthma Father    Colon cancer Neg Hx    Colon polyps Neg Hx    Esophageal cancer Neg Hx    Rectal cancer Neg Hx    Stomach cancer Neg Hx    Social History   Socioeconomic History   Marital status: Divorced    Spouse name: Not on file   Number of children: 2   Years of education:  Not on file   Highest education level: Not on file  Occupational History   Occupation: unemployed  Tobacco Use   Smoking status: Former    Packs/day: 0.50    Years: 25.00    Total pack years: 12.50    Types: Cigarettes    Quit date: 03/21/2018    Years since quitting: 3.7   Smokeless tobacco: Never  Vaping Use   Vaping Use: Former  Substance and Sexual Activity   Alcohol use: Yes    Alcohol/week: 76.0 standard drinks of alcohol    Types: 36 Cans of beer, 40 Shots of liquor per week   Drug use: Yes    Frequency: 1.0 times per week    Types: Marijuana    Comment: marajuana...also valium not prescribed   Sexual activity: Not Currently  Other Topics Concern   Not on file  Social History Narrative   05/01/2018:   Lives with mother on one level   Has two 8yo sons who live nearby   Enjoys hunting/fishing   Since being hospitalized for COPD exacerbation, has quit smoking, and is motivated to do aerobic exercise   Heavy alcohol consumer, does not see it as a problem, no interest in reducing alcohol intake.   Social Determinants of Health   Financial Resource Strain: Low Risk  (12/27/2021)   Overall Financial Resource Strain (CARDIA)    Difficulty of Paying Living Expenses: Not hard at all  Food Insecurity: No Food Insecurity (12/27/2021)   Hunger Vital Sign    Worried About Running Out of Food in the Last Year: Never true    Ran Out of Food in the Last Year: Never true  Transportation Needs: No Transportation Needs (12/27/2021)   PRAPARE - Hydrologist (Medical): No    Lack of Transportation (Non-Medical): No  Physical Activity: Insufficiently Active (12/27/2021)   Exercise Vital Sign    Days of Exercise per Week: 3 days    Minutes of Exercise per Session: 30 min  Stress: No Stress Concern Present (12/27/2021)   Fidelis    Feeling of Stress : Not at all  Social Connections:  Socially Isolated (12/27/2021)   Social Connection and Isolation Panel [NHANES]    Frequency of Communication with Friends and Family: More than three times a week    Frequency of Social Gatherings with Friends and Family: More than three times a week    Attends Religious Services: Never    Marine scientist or Organizations: No    Attends  Archivist Meetings: Never    Marital Status: Divorced    Tobacco Counseling Counseling given: Not Answered   Clinical Intake:  Pre-visit preparation completed: No  Pain : No/denies pain     BMI - recorded: 40.64 Nutritional Risks: None Diabetes: No  How often do you need to have someone help you when you read instructions, pamphlets, or other written materials from your doctor or pharmacy?: 1 - Never  Diabetic?  No  Interpreter Needed?: No  Information entered by :: Rolene Arbour LPN   Activities of Daily Living    12/27/2021    1:27 PM  In your present state of health, do you have any difficulty performing the following activities:  Hearing? 0  Vision? 0  Difficulty concentrating or making decisions? 0  Walking or climbing stairs? 0  Dressing or bathing? 0  Doing errands, shopping? 0  Preparing Food and eating ? N  Using the Toilet? N  In the past six months, have you accidently leaked urine? N  Do you have problems with loss of bowel control? N  Managing your Medications? N  Managing your Finances? N  Housekeeping or managing your Housekeeping? N    Patient Care Team: Dorothyann Peng, NP as PCP - General (Family Medicine) Collene Gobble, MD as Consulting Physician (Pulmonary Disease) Viona Gilmore, [facility] as Pharmacist (Pharmacist)  Indicate any recent Medical Services you may have received from other than [facility] providers in the past year (date may be approximate).     Assessment:   This is a routine wellness examination for [employer].  Hearing/Vision screen Hearing Screening - Comments:: Denies  hearing difficulties   Vision Screening - Comments:: Wears reading glasses - up to date with routine eye exams with Patient deferred   Dietary issues and exercise activities discussed: Current Exercise Habits: Home exercise routine, Time (Minutes): 30, Frequency (Times/Week): 3, Weekly Exercise (Minutes/Week): 90, Intensity: Moderate, Exercise limited by: None identified   Goals Addressed               This Visit's Progress     Weight (lb) < 200 lb (90.7 kg) (pt-stated)   308 lb (139.7 kg)     I want to lose about 90lbs within a year,       Depression Screen    12/27/2021    1:26 PM 05/26/2021   10:08 AM 11/10/2020    2:43 PM 11/07/2019    9:54 AM 05/01/2018    2:05 PM 04/29/2018    2:26 PM 06/09/2013    1:00 PM  PHQ 2/9 Scores  PHQ - 2 Score 0 0 0 0 0 0 0  PHQ- 9 Score    0 3 3     Fall Risk    12/27/2021    1:28 PM 11/10/2020    2:45 PM 11/07/2019    9:53 AM 05/01/2018    2:05 PM 04/29/2018    2:29 PM  Fall Risk   Falls in the past year? 0 0 0 0 0  Number falls in past yr: 0 0 0    Injury with Fall? 0 0 0    Risk for fall due to : No Fall Risks Impaired vision No Fall Risks    Follow up Falls prevention discussed Falls prevention discussed Falls evaluation completed;Falls prevention discussed      FALL RISK PREVENTION PERTAINING TO THE HOME:  Any stairs in or around the home? Yes  If so, are there any without handrails? No  Home free of loose throw rugs in walkways, pet beds, electrical cords, etc? Yes  Adequate lighting in your home to reduce risk of falls? Yes   ASSISTIVE DEVICES UTILIZED TO PREVENT FALLS:  Life alert? No  Use of a cane, walker or w/c? No  Grab bars in the bathroom? No  Shower chair or bench in shower? Yes  Elevated toilet seat or a handicapped toilet? Yes   TIMED UP AND GO:  Was the test performed? No . Audio Visit  Cognitive Function:        12/27/2021    1:29 PM 11/10/2020    2:46 PM  6CIT Screen  What Year? 0 points 0 points   What month? 0 points 0 points  What time? 0 points 0 points  Count back from 20 0 points 0 points  Months in reverse 0 points 0 points  Repeat phrase 0 points 2 points  Total Score 0 points 2 points    Immunizations There is no immunization history for the selected administration types on file for this patient.  TDAP status: Due, Education has been provided regarding the importance of this vaccine. Advised may receive this vaccine at local pharmacy or Health Dept. Aware to provide a copy of the vaccination record if obtained from local pharmacy or Health Dept. Verbalized acceptance and understanding.  Flu Vaccine status: Declined, Education has been provided regarding the importance of this vaccine but patient still declined. Advised may receive this vaccine at local pharmacy or Health Dept. Aware to provide a copy of the vaccination record if obtained from local pharmacy or Health Dept. Verbalized acceptance and understanding.    Covid-19 vaccine status: Declined, Education has been provided regarding the importance of this vaccine but patient still declined. Advised may receive this vaccine at local pharmacy or Health Dept.or vaccine clinic. Aware to provide a copy of the vaccination record if obtained from local pharmacy or Health Dept. Verbalized acceptance and understanding.  Qualifies for Shingles Vaccine? Yes   Zostavax completed No   Shingrix Completed?: No.    Education has been provided regarding the importance of this vaccine. Patient has been advised to call insurance company to determine out of pocket expense if they have not yet received this vaccine. Advised may also receive vaccine at local pharmacy or Health Dept. Verbalized acceptance and understanding.  Screening Tests Health Maintenance  Topic Date Due   COVID-19 Vaccine (1) 01/12/2022 (Originally 08/17/1975)   Zoster Vaccines- Shingrix (1 of 2) 03/29/2022 (Originally 08/16/1988)   TETANUS/TDAP  12/28/2022 (Originally  [birth year]16/1989)   Medicare Annual Wellness (AWV)  12/28/2022   COLONOSCOPY (Pts 45-71yr Insurance coverage will need to be confirmed)  10/08/2029   Hepatitis C Screening  Completed   HIV Screening  Completed   HPV VACCINES  Aged Out    Health Maintenance  There are no preventive care reminders to display for this patient.   Colorectal cancer screening: Type of screening: Colonoscopy. Completed 10/09/19. Repeat every 10 years  Lung Cancer Screening: (Low Dose CT Chest recommended if Age 833-80years, 30 pack-year currently smoking OR have quit w/in 15years.) does not qualify.     Additional Screening:  Hepatitis C Screening: does qualify; Completed 07/31/17  Vision Screening: Recommended annual ophthalmology exams for early detection of glaucoma and other disorders of the eye. Is the patient up to date with their annual eye exam?  No Who is the provider or what is the name of the office in which the patient attends annual eye  exams? Patient deferred If pt is not established with a provider, would they like to be referred to a provider to establish care? No .   Dental Screening: Recommended annual dental exams for proper oral hygiene  Community Resource Referral / Chronic Care Management:  CRR required this visit?  No   CCM required this visit?  No      Plan:     I have personally reviewed and noted the following in the patient's chart:   Medical and social history Use of alcohol, tobacco or illicit drugs  Current medications and supplements including opioid prescriptions. Patient is not currently taking opioid prescriptions. Functional ability and status Nutritional status Physical activity Advanced directives List of other physicians Hospitalizations, surgeries, and ER visits in previous 12 months Vitals Screenings to include cognitive, depression, and falls Referrals and appointments  In addition, I have reviewed and discussed with patient certain preventive  protocols, quality metrics, and best practice recommendations. A written personalized care plan for preventive services as well as general preventive health recommendations were provided to patient.     Billy Peaches, LPN   [id number]   Nurse Notes: None

## 2022-01-10 ENCOUNTER — Encounter (INDEPENDENT_AMBULATORY_CARE_PROVIDER_SITE_OTHER): Payer: Self-pay | Admitting: Internal Medicine

## 2022-01-10 ENCOUNTER — Ambulatory Visit (INDEPENDENT_AMBULATORY_CARE_PROVIDER_SITE_OTHER): Payer: Medicare Other | Admitting: Internal Medicine

## 2022-01-10 ENCOUNTER — Ambulatory Visit (INDEPENDENT_AMBULATORY_CARE_PROVIDER_SITE_OTHER): Payer: Medicare Other | Admitting: Primary Care

## 2022-01-10 ENCOUNTER — Ambulatory Visit (INDEPENDENT_AMBULATORY_CARE_PROVIDER_SITE_OTHER): Payer: Medicare Other

## 2022-01-10 ENCOUNTER — Encounter: Payer: Self-pay | Admitting: Primary Care

## 2022-01-10 VITALS — BP 96/67 | HR 64 | Temp 98.2°F | Ht 73.0 in | Wt 312.0 lb

## 2022-01-10 VITALS — BP 100/58 | HR 85 | Temp 98.4°F | Ht 73.0 in | Wt 318.4 lb

## 2022-01-10 DIAGNOSIS — J449 Chronic obstructive pulmonary disease, unspecified: Secondary | ICD-10-CM

## 2022-01-10 DIAGNOSIS — I10 Essential (primary) hypertension: Secondary | ICD-10-CM | POA: Diagnosis not present

## 2022-01-10 DIAGNOSIS — R0602 Shortness of breath: Secondary | ICD-10-CM

## 2022-01-10 DIAGNOSIS — R7303 Prediabetes: Secondary | ICD-10-CM | POA: Diagnosis not present

## 2022-01-10 DIAGNOSIS — F1090 Alcohol use, unspecified, uncomplicated: Secondary | ICD-10-CM | POA: Diagnosis not present

## 2022-01-10 DIAGNOSIS — G4733 Obstructive sleep apnea (adult) (pediatric): Secondary | ICD-10-CM

## 2022-01-10 DIAGNOSIS — Z6841 Body Mass Index (BMI) 40.0 and over, adult: Secondary | ICD-10-CM

## 2022-01-10 DIAGNOSIS — R5383 Other fatigue: Secondary | ICD-10-CM

## 2022-01-10 DIAGNOSIS — Z1331 Encounter for screening for depression: Secondary | ICD-10-CM | POA: Diagnosis not present

## 2022-01-10 DIAGNOSIS — J9691 Respiratory failure, unspecified with hypoxia: Secondary | ICD-10-CM

## 2022-01-10 DIAGNOSIS — E669 Obesity, unspecified: Secondary | ICD-10-CM | POA: Diagnosis not present

## 2022-01-10 DIAGNOSIS — J189 Pneumonia, unspecified organism: Secondary | ICD-10-CM

## 2022-01-10 DIAGNOSIS — J441 Chronic obstructive pulmonary disease with (acute) exacerbation: Secondary | ICD-10-CM | POA: Diagnosis not present

## 2022-01-10 NOTE — Patient Instructions (Addendum)
Orders: CXR today (ordered) Walk test today  Recommendations: Continue Symbicort 160 two puffs twice daily  Resume Spiriva Handier 1 capsule daily (new RX sent) Start Zyrtec 10 mg daily and take robitussin for cough  Wear oxygen with CPAP and during the day with moderate exertion to keep O2 >88%  Follow-up: 3 months with Dr. Lamonte Sakai

## 2022-01-10 NOTE — Progress Notes (Signed)
$'@Patient'j$  ID: Billy [name], male    DOB: [birth year], 51 y.o.   MRN: [id number]  Chief Complaint  Patient presents with   Follow-up    Referring provider: Dorothyann Peng, NP  HPI: 51 year old male, former smoker quit in January 2020 (12.5-pack-year history).  Past medical history significant for COPD, allergic rhinitis, OSA, respiratory failure with hypoxia, polycythemia vera.  Patient of Dr. Lamonte Sakai and follows with Dr. Ander Slade for sleep.  01/10/2022- INTERIM  Patient presents today for acute overview due to shortness of breath. Hx COPD and OSA. Pulmonary function testing in September 2021 showed severe obstructive lung disease with significant bronchodilator improvement. FEV1 has improved compared with 06/01/10.  He had two episodes of shortness of breath in the last 2 weeks. He is maintained on Symbicort 134mg. He stopped taking Spiriva Respimat 1 week ago. He prefers handihaler vs respimat. He has been using albuterol more frequently which he feels contributed to his dyspnea symptoms exacerbating on Sunday 11/5. Second episode he feels was due to stressing/fixing over his oxygen levels. He reports noticing lower O2 levels at home. He got a new oximeter. He wears oxygen with CPAP at night. He has not had any further episodes of shortness of breath. He has persistent throat clearing cough/upper airway congestion.  Airview download 12/11/21-01/09/22 30/30 days Average 7 hours  Pressure 5-20cm h20 Airleaks 51L/min  AHGI 3.8    Allergies  Allergen Reactions   Ativan [Lorazepam] Other (See Comments)    Pt given Ativan.  Pt became unresponsive.    There is no immunization history for the selected administration types on file for this patient.  Past Medical History:  Diagnosis Date   Alcohol use    Allergy    as a child- none as adult    COPD (chronic obstructive pulmonary disease) ([place]    Drug use    Edema of both lower extremities    History of bronchitis     Hypertension    Obesity    Oxygen deficiency    Pt uses 02  3 liters with his CPAP ONLY    PFO (patent foramen ovale)    Polycythemia    a. Dr. EMarin Olp.Marland KitchenMarland KitchenAK-2 analysis is negative.  b. likely physiologic secondary to chronic hypoxia (? OSA + COPD + obesity).  c. s/p plebotomy x1   PVD (peripheral vascular disease) (HCC)    Sleep apnea    a. mod severe by sleep study 9.30.11: AHI 44.8 per hour; oxygen desat to a nadir of 65%; unsuccessful CPAP titration; significant hypoxemia even with supp o2 at 3LPM (dest to 70-80%); needs eval for cardiopulmonary disease and upper airway obstruction   SOB (shortness of breath)    Substance abuse (HCC)    alcoholism    Tobacco abuse     Tobacco History: Social History   Tobacco Use  Smoking Status Former   Packs/day: 0.50   Years: 25.00   Total pack years: 12.50   Types: Cigarettes   Quit date: 03/21/2018   Years since quitting: 3.8  Smokeless Tobacco Never   Counseling given: Not Answered   Outpatient Medications Prior to Visit  Medication Sig Dispense Refill   albuterol (VENTOLIN HFA) 108 (90 Base) MCG/ACT inhaler Inhale 1-2 puffs into the lungs every 6 (six) hours as needed for wheezing or shortness of breath. 8.5 each 4   aspirin 81 MG tablet Take 2 tablets (161 mg total) by mouth daily. 30 tablet 0   SYMBICORT 160-4.5 MCG/ACT inhaler TAKE 2 PUFFS BY  MOUTH TWICE A DAY 10.2 each 11   Tiotropium Bromide Monohydrate (SPIRIVA RESPIMAT) 2.5 MCG/ACT AERS Inhale 2 puffs into the lungs daily. 4 g 5   valsartan (DIOVAN) 160 MG tablet Take 1 tablet (160 mg total) by mouth daily. 30 tablet 11   No facility-administered medications prior to visit.    Review of Systems  Review of Systems  Constitutional: Negative.   HENT:  Positive for congestion.   Respiratory:  Positive for cough.   Cardiovascular: Negative.     Physical Exam  BP (!) 100/58 (BP Location: Right Arm, Patient Position: Sitting, Cuff Size: Large)   Pulse 85   Temp 98.4  F (36.9 C) (Oral)   Ht '6\' 1"'$  (1.854 m)   Wt (!) 318 lb 6.4 oz (144.4 kg)   SpO2 93%   BMI 42.01 kg/m  Physical Exam Constitutional:      General: He is not in acute distress.    Appearance: Normal appearance. He is not ill-appearing.  HENT:     Head: Normocephalic and atraumatic.     Mouth/Throat:     Mouth: Mucous membranes are moist.     Pharynx: Oropharynx is clear.  Cardiovascular:     Rate and Rhythm: Normal rate and regular rhythm.  Pulmonary:     Effort: Pulmonary effort is normal. No respiratory distress.     Breath sounds: No wheezing, rhonchi or rales.     Comments: O2 93% room air at rest Musculoskeletal:        General: Normal range of motion.  Skin:    General: Skin is warm and dry.  Neurological:     General: No focal deficit present.     Mental Status: He is alert and oriented to person, place, and time. Mental status is at baseline.  Psychiatric:        Mood and Affect: Mood normal.        Behavior: Behavior normal.        Thought Content: Thought content normal.        Judgment: Judgment normal.      Lab Results:  CBC    Component Value Date/Time   WBC 6.6 01/10/2022 0952   WBC 10.9 (H) 05/27/2020 1220   RBC 4.71 01/10/2022 0952   RBC 4.35 05/27/2020 1220   HGB 16.0 01/10/2022 0952   HGB 17.9 (H) 12/16/2014 1407   HCT 47.8 01/10/2022 0952   HCT 52.5 (H) 12/16/2014 1407   PLT 184 01/10/2022 0952   MCV 102 (H) 01/10/2022 0952   MCV 101 (H) 12/16/2014 1407   MCH 34.0 (H) 01/10/2022 0952   MCH 35.4 (H) 07/12/2018 1602   MCHC 33.5 01/10/2022 0952   MCHC 34.5 05/27/2020 1220   RDW 11.8 01/10/2022 0952   RDW 12.1 12/16/2014 1407   LYMPHSABS 1.2 01/10/2022 0952   LYMPHSABS 2.0 12/16/2014 1407   MONOABS 0.2 05/27/2020 1220   EOSABS 0.2 01/10/2022 0952   EOSABS 0.2 12/16/2014 1407   BASOSABS 0.1 01/10/2022 0952   BASOSABS 0.1 12/16/2014 1407    BMET    Component Value Date/Time   NA 137 01/10/2022 0952   NA 139 12/16/2014 1407   K 5.1  01/10/2022 0952   K 4.4 12/16/2014 1407   CL 95 (L) 01/10/2022 0952   CL 98 12/08/2013 0840   CO2 27 01/10/2022 0952   CO2 28 12/16/2014 1407   GLUCOSE 93 01/10/2022 0952   GLUCOSE 102 (H) 08/05/2019 1015   GLUCOSE 93 12/16/2014 1407  GLUCOSE 110 12/08/2013 0840   BUN 15 01/10/2022 0952   BUN 13.4 12/16/2014 1407   CREATININE 1.02 01/10/2022 0952   CREATININE 1.30 07/12/2018 1602   CREATININE 0.9 12/16/2014 1407   CALCIUM 9.7 01/10/2022 0952   CALCIUM 9.5 12/16/2014 1407   GFRNONAA >60 03/26/2018 0315   GFRAA >60 03/26/2018 0315    BNP    Component Value Date/Time   BNP 39.4 03/22/2018 1011    ProBNP    Component Value Date/Time   PROBNP 30.0 05/30/2010 1556    Imaging: DG Chest 2 View  Result Date: 01/11/2022 CLINICAL DATA:  COPD exacerbation EXAM: CHEST - 2 VIEW COMPARISON:  06/30/2019 FINDINGS: Scarring in the left lower lung. Vague left infrahilar opacity. Mild chronic bronchitic change. Normal cardiac size. No pneumothorax. IMPRESSION: 1. Vague left infrahilar opacity, if symptoms not suggestive of pneumonia, recommend chest CT to exclude nodule. Otherwise short interval radiographic follow-up would be recommended. 2. Chronic bronchitic changes. Electronically Signed   By: Donavan Foil M.D.   On: 01/11/2022 20:53     Assessment & Plan:   COPD (chronic obstructive pulmonary disease) ([place]) - Uncontrolled; Patient had two episodes shortness of breath in the last 2 weeks. Not currently on LAMA. Using Albuterol rescue inhaler more frequently. Recommend patient continue Symbicort 160 two puffs twice daily. We will resume Spiriva Handier 1 capsule daily (new RX sent). Checking CXR.   Pneumonia - Persistent cough/chest congestion with increase dyspnea symptoms last 2 weeks. CXR showed vague opacity, treating for suspected pneumonia.  Sent in RX Augmentin 1 tab twice daily x 7 days. Needs repeat CXR in 4 weeks   Respiratory failure with hypoxia ([place]) - Checking walk  test in office. Patient uses nocturnal oxygen at night with CPAP. O2 93% at rest on room air. Advised he use supplemental O2 with moderate exertion to keep O2 >88%  Obstructive sleep apnea - OSA well controlled on auto CPAP 5-20cm h20. Patient is 100% compliant with use last 30 days. No changes.     Martyn Ehrich, NP 01/15/2022

## 2022-01-12 MED ORDER — AMOXICILLIN-POT CLAVULANATE 875-125 MG PO TABS: 1.0000 | ORAL_TABLET | Freq: Two times a day (BID) | ORAL | 0 refills | Status: DC

## 2022-01-12 NOTE — Progress Notes (Signed)
Please let patient know CXR showed a vague opacity left lung. Since he had symptoms of sob and congestion I am going to treat him for suspected pneumonia. We should repeat chest imaging in 4 weeks to ensure resolution. I will place all orders, just ask him to pick up prescription and follow-up with me in 4 weeks if he does not have an apt already

## 2022-01-14 LAB — CBC WITH DIFFERENTIAL/PLATELET
Basophils Absolute: 0.1 10*3/uL (ref 0.0–0.2)
Basos: 1 %
EOS (ABSOLUTE): 0.2 10*3/uL (ref 0.0–0.4)
Eos: 3 %
Hematocrit: 47.8 % (ref 37.5–51.0)
Hemoglobin: 16 g/dL (ref 13.0–17.7)
Immature Grans (Abs): 0 10*3/uL (ref 0.0–0.1)
Immature Granulocytes: 1 %
Lymphocytes Absolute: 1.2 10*3/uL (ref 0.7–3.1)
Lymphs: 18 %
MCH: 34 pg — ABNORMAL HIGH (ref 26.6–33.0)
MCHC: 33.5 g/dL (ref 31.5–35.7)
MCV: 102 fL — ABNORMAL HIGH (ref 79–97)
Monocytes Absolute: 0.5 10*3/uL (ref 0.1–0.9)
Monocytes: 8 %
Neutrophils Absolute: 4.7 10*3/uL (ref 1.4–7.0)
Neutrophils: 69 %
Platelets: 184 10*3/uL (ref 150–450)
RBC: 4.71 x10E6/uL (ref 4.14–5.80)
RDW: 11.8 % (ref 11.6–15.4)
WBC: 6.6 10*3/uL (ref 3.4–10.8)

## 2022-01-14 LAB — COMPREHENSIVE METABOLIC PANEL WITH GFR
ALT: 23 [IU]/L (ref 0–44)
AST: 17 [IU]/L (ref 0–40)
Albumin/Globulin Ratio: 2.2 (ref 1.2–2.2)
Albumin: 4.7 g/dL (ref 3.8–4.9)
Alkaline Phosphatase: 59 [IU]/L (ref 44–121)
BUN/Creatinine Ratio: 15 (ref 9–20)
BUN: 15 mg/dL (ref 6–24)
Bilirubin Total: 0.4 mg/dL (ref 0.0–1.2)
CO2: 27 mmol/L (ref 20–29)
Calcium: 9.7 mg/dL (ref 8.7–10.2)
Chloride: 95 mmol/L — ABNORMAL LOW (ref 96–106)
Creatinine, Ser: 1.02 mg/dL (ref 0.76–1.27)
Globulin, Total: 2.1 g/dL (ref 1.5–4.5)
Glucose: 93 mg/dL (ref 70–99)
Potassium: 5.1 mmol/L (ref 3.5–5.2)
Sodium: 137 mmol/L (ref 134–144)
Total Protein: 6.8 g/dL (ref 6.0–8.5)
eGFR: 88 mL/min/{1.73_m2}

## 2022-01-14 LAB — LIPID PANEL WITH LDL/HDL RATIO
Cholesterol, Total: 185 mg/dL (ref 100–199)
HDL: 52 mg/dL
LDL Chol Calc (NIH): 114 mg/dL — ABNORMAL HIGH (ref 0–99)
LDL/HDL Ratio: 2.2 ratio (ref 0.0–3.6)
Triglycerides: 104 mg/dL (ref 0–149)
VLDL Cholesterol Cal: 19 mg/dL (ref 5–40)

## 2022-01-14 LAB — HEMOGLOBIN A1C
Est. average glucose Bld gHb Est-mCnc: 114 mg/dL
Hgb A1c MFr Bld: 5.6 % (ref 4.8–5.6)

## 2022-01-14 LAB — TSH: TSH: 0.882 u[IU]/mL (ref 0.450–4.500)

## 2022-01-14 LAB — VITAMIN D 25 HYDROXY (VIT D DEFICIENCY, FRACTURES): Vit D, 25-Hydroxy: 19.6 ng/mL — ABNORMAL LOW (ref 30.0–100.0)

## 2022-01-14 LAB — VITAMIN B12: Vitamin B-12: 228 pg/mL — ABNORMAL LOW (ref 232–1245)

## 2022-01-14 LAB — VITAMIN B1: Thiamine: 125.6 nmol/L (ref 66.5–200.0)

## 2022-01-14 LAB — INSULIN, RANDOM: INSULIN: 12.6 u[IU]/mL (ref 2.6–24.9)

## 2022-01-15 ENCOUNTER — Telehealth: Payer: Self-pay | Admitting: Primary Care

## 2022-01-15 DIAGNOSIS — J189 Pneumonia, unspecified organism: Secondary | ICD-10-CM | POA: Insufficient documentation

## 2022-01-15 NOTE — Assessment & Plan Note (Signed)
-   OSA well controlled on auto CPAP 5-20cm h20. Patient is 100% compliant with use last 30 days. No changes.

## 2022-01-15 NOTE — Assessment & Plan Note (Addendum)
-   Checking walk test in office. Patient uses nocturnal oxygen at night with CPAP. O2 93% at rest on room air. Advised he use supplemental O2 with moderate exertion to keep O2 >88%

## 2022-01-15 NOTE — Assessment & Plan Note (Signed)
-   Persistent cough/chest congestion with increase dyspnea symptoms last 2 weeks. CXR showed vague opacity, treating for suspected pneumonia.  Sent in RX Augmentin 1 tab twice daily x 7 days. Needs repeat CXR in 4 weeks

## 2022-01-15 NOTE — Telephone Encounter (Signed)
Can we get patient a follow-up in 7-10 days d/t pneumonia

## 2022-01-15 NOTE — Assessment & Plan Note (Signed)
-   Uncontrolled; Patient had two episodes shortness of breath in the last 2 weeks. Not currently on LAMA. Using Albuterol rescue inhaler more frequently. Recommend patient continue Symbicort 160 two puffs twice daily. We will resume Spiriva Handier 1 capsule daily (new RX sent). Checking CXR.

## 2022-01-16 NOTE — Telephone Encounter (Signed)
Spoke with pt are provided CXR results as dictated by Geraldo Pitter. Also went over medication instructions. Pt was scheduled for OV with 02/14/22. Pt stated understanding. Nothing further needed at this time.

## 2022-01-16 NOTE — Telephone Encounter (Signed)
Attempted to call pt but when line connected, the line immediately went dead. Tried to call back, unable to reach pt.  Routing to front desk pool for help with trying to get pt scheduled.

## 2022-01-24 ENCOUNTER — Ambulatory Visit (INDEPENDENT_AMBULATORY_CARE_PROVIDER_SITE_OTHER): Payer: Medicare Other | Admitting: Internal Medicine

## 2022-01-24 ENCOUNTER — Encounter (INDEPENDENT_AMBULATORY_CARE_PROVIDER_SITE_OTHER): Payer: Self-pay | Admitting: Internal Medicine

## 2022-01-24 VITALS — BP 120/76 | HR 97 | Temp 98.1°F | Ht 73.0 in | Wt 308.0 lb

## 2022-01-24 DIAGNOSIS — E669 Obesity, unspecified: Secondary | ICD-10-CM

## 2022-01-24 DIAGNOSIS — E559 Vitamin D deficiency, unspecified: Secondary | ICD-10-CM

## 2022-01-24 DIAGNOSIS — G4733 Obstructive sleep apnea (adult) (pediatric): Secondary | ICD-10-CM

## 2022-01-24 DIAGNOSIS — E538 Deficiency of other specified B group vitamins: Secondary | ICD-10-CM

## 2022-01-24 DIAGNOSIS — F1090 Alcohol use, unspecified, uncomplicated: Secondary | ICD-10-CM

## 2022-01-24 DIAGNOSIS — Z6841 Body Mass Index (BMI) 40.0 and over, adult: Secondary | ICD-10-CM

## 2022-01-24 MED ORDER — VITAMIN B-12 1000 MCG PO TABS: 1000.0000 ug | ORAL_TABLET | Freq: Every day | ORAL | 0 refills | Status: DC

## 2022-01-24 MED ORDER — ERGOCALCIFEROL 1.25 MG (50000 UT) PO CAPS: 50000.0000 [IU] | ORAL_CAPSULE | ORAL | 0 refills | Status: DC

## 2022-01-24 NOTE — Progress Notes (Unsigned)
Chief Complaint:   OBESITY Billy [name] (MR# [id number]) is a 51 y.o. male who presents for evaluation and treatment of obesity and related comorbidities. Current BMI is Body mass index is 41.16 kg/m. Billy [name] has been struggling with his weight for many years and has been unsuccessful in either losing weight, maintaining weight loss, or reaching his healthy weight goal.  Billy [name] is currently in the action stage of change and ready to dedicate time achieving and maintaining a healthier weight. Billy [name] is interested in becoming our patient and working on intensive lifestyle modifications including (but not limited to) diet and exercise for weight loss.  Billy [name]'s habits were reviewed today and are as follows: His family eats meals together, he thinks his family will eat healthier with him, his desired weight loss is 87 lbs, he has been heavy most of his life, he started gaining weight at 51 years old, his heaviest weight ever was 339 pounds, he has significant food cravings issues, he is frequently drinking liquids with calories, and he frequently makes poor food choices.  Depression Screen Billy [name]'s Food and Mood (modified PHQ-9) score was 5.  Subjective:   1. Other fatigue Billy [name] admits to daytime somnolence and denies waking up still tired. Patient has a history of symptoms of daytime fatigue. Billy [name] generally gets 8 hours of sleep per night, and states that he has generally restful sleep. Snoring is present. Apneic episodes are present. Epworth Sleepiness Score is 6.   2. SOB (shortness of breath) on exertion Billy [name] notes increasing shortness of breath with exercising and seems to be worsening over time with weight gain. He notes getting out of breath sooner with activity than he used to. This has not gotten worse recently. Billy [name] denies shortness of breath at rest or orthopnea.  3. Obstructive sleep apnea Billy [name] is on CPAP with good compliance.  4. Essential hypertension Billy [name]'s blood pressure is  well controlled.  He is on valsartan with no orthostasis.  5. Alcohol use disorder Billy [name] is contemplative.  He acknowledges and recognizes health risk.  He has a strong family history of alcohol use disorder and members with cirrhosis.  Beer contributing between 500-700 cal daily.  Assessment/Plan:   1. Other fatigue Billy [name] does feel that his weight is causing his energy to be lower than it should be. Fatigue may be related to obesity, depression or many other causes. Labs will be ordered, and in the meanwhile, Billy [name] will focus on self care including making healthy food choices, increasing physical activity and focusing on stress reduction.  - EKG 12-Lead - Vitamin B12 - Vitamin B1  2. SOB (shortness of breath) on exertion Billy [name] does feel that he gets out of breath more easily that he used to when he exercises. Billy [name]'s shortness of breath appears to be obesity related and exercise induced. He has agreed to work on weight loss and gradually increase exercise to treat his exercise induced shortness of breath. Will continue to monitor closely.  3. Obstructive sleep apnea We will check labs today. Manson will work on his weight loss therapy with 15% total body weight loss to improve his condition.  - CBC with Differential/Platelet  4. Essential hypertension We will check labs today. Billy [name] will work on his weight loss therapy, and he may need a reduction in his blood pressure medications with weight loss therapy and decrease alcohol consumption.  - Lipid Panel With LDL/HDL Ratio - Comprehensive metabolic panel  5. Alcohol use disorder Billy [name] would benefit from  counseling and pharmacotherapy.  He will work on reducing his beer intake.  6. Depression screening Billy [name] had a positive depression screening. Depression is commonly associated with obesity and often results in emotional eating behaviors. We will monitor this closely and work on CBT to help improve the non-hunger eating patterns. Referral to  Psychology may be required if no improvement is seen as he continues in our clinic.  7. Obesity, current BMI 41.2 Billy [name] is currently in the action stage of change and his goal is to continue with weight loss efforts. I recommend Billy [name] begin the structured treatment plan as follows:  He has agreed to the Category 4 Plan + 200 calories. He will work on reducing liquid calories.   We will check labs today.   - Hemoglobin A1c - VITAMIN D 25 Hydroxy (Vit-D Deficiency, Fractures) - TSH - Insulin, random  Exercise goals: All adults should avoid inactivity. Some physical activity is better than none, and adults who participate in any amount of physical activity gain some health benefits.   Behavioral modification strategies: increasing lean protein intake, decreasing simple carbohydrates, increasing vegetables, increasing water intake, decreasing liquid calories, decreasing alcohol intake, increasing high fiber foods, no skipping meals, meal planning and cooking strategies, keeping healthy foods in the home, avoiding temptations, and planning for success.  He was informed of the importance of frequent follow-up visits to maximize his success with intensive lifestyle modifications for his multiple health conditions. He was informed we would discuss his lab results at his next visit unless there is a critical issue that needs to be addressed sooner. Doyne agreed to keep his next visit at the agreed upon time to discuss these results.  Objective:   Blood pressure 96/67, pulse 64, temperature 98.2 F (36.8 C), height '6\' 1"'$  (1.854 m), weight (!) 312 lb (141.5 kg), SpO2 91 %. Body mass index is 41.16 kg/m.  EKG: Normal sinus rhythm, rate 96 BPM.  Indirect Calorimeter completed today shows a VO2 of 486 and a REE of 3355.  His calculated basal metabolic rate is 0086 thus his basal metabolic rate is better than expected.  General: Cooperative, alert, well developed, in no acute distress. HEENT:  Conjunctivae and lids unremarkable. Cardiovascular: Regular rhythm.  Lungs: Normal work of breathing. Neurologic: No focal deficits.   Lab Results  Component Value Date   CREATININE 1.02 01/10/2022   BUN 15 01/10/2022   NA 137 01/10/2022   K 5.1 01/10/2022   CL 95 (L) 01/10/2022   CO2 27 01/10/2022   Lab Results  Component Value Date   ALT 23 01/10/2022   AST 17 01/10/2022   ALKPHOS 59 01/10/2022   BILITOT 0.4 01/10/2022   Lab Results  Component Value Date   HGBA1C 5.6 01/10/2022   HGBA1C 5.6 08/05/2019   HGBA1C 5.2 01/01/2019   HGBA1C 5.6 07/31/2017   Lab Results  Component Value Date   INSULIN 12.6 01/10/2022   Lab Results  Component Value Date   TSH 0.882 01/10/2022   Lab Results  Component Value Date   CHOL 185 01/10/2022   HDL 52 01/10/2022   LDLCALC 114 (H) 01/10/2022   TRIG 104 01/10/2022   CHOLHDL 4 08/05/2019   Lab Results  Component Value Date   WBC 6.6 01/10/2022   HGB 16.0 01/10/2022   HCT 47.8 01/10/2022   MCV 102 (H) 01/10/2022   PLT 184 01/10/2022   Lab Results  Component Value Date   IRON 174 07/12/2018   TIBC 324 07/12/2018  FERRITIN 224 03/22/2018   Attestation Statements:   Reviewed by clinician on day of visit: allergies, medications, problem list, medical history, surgical history, family history, social history, and previous encounter notes.  Time spent on visit including pre-visit chart review and post-visit charting and care was 40 minutes.   [name], am acting as transcriptionist for [name], MD.  I have reviewed the above documentation for accuracy and completeness, and I agree with the above. -[name], MD

## 2022-01-26 DIAGNOSIS — G4733 Obstructive sleep apnea (adult) (pediatric): Secondary | ICD-10-CM | POA: Diagnosis not present

## 2022-02-06 NOTE — Progress Notes (Unsigned)
Chief Complaint:   OBESITY Billy [name] is here to discuss his progress with his obesity treatment plan along with follow-up of his obesity related diagnoses. Billy [name] is on the Category 4 Plan with 200 calories and states he is following his eating plan approximately 10% of the time. Billy [name] states he is walking more.  Today's visit was #: 2 Starting weight: 312 lbs Starting date: 01/10/2022 Today's weight: 308 lbs Today's date: 01/24/2022 Total lbs lost to date: 4 Total lbs lost since last in-office visit: 4  Interim History: Billy [name] is working on portion control. He has had difficulties with journaling using the apps. He has reduced beer at night. Pt reports adequate satiety and satiation. He denies abnormal eating behaviors. He enjoys vegetables and snacks on nabs at times.  Subjective:   1. B12 deficiency New. Discussed labs with patient today. B12 228; Elevated MCV; Normal blood count; Thiamine levels normal. Deficiency may be secondary to alcohol use, which is improving.  2. Vitamin D deficiency New. Discussed labs with patient today. Vit D level 19.6 and associated with adiposity and leptin resistance, fatigue.  3. Obstructive sleep apnea Billy [name] is on a CPAP with oxygen. He reports good compliance.   4. Alcohol use disorder Discussed labs with patient today. Improving. Pt declines counseling. He wants to focus on weight management. Liver enzymes within normal limits. Elevated MCV with B12 deficiency and normal thiamine.  Assessment/Plan:   1. B12 deficiency Continue to reduce alcohol intake. Start B12 supplement daily.  Start- cyanocobalamin (VITAMIN B12) 1000 MCG tablet; Take 1 tablet (1,000 mcg total) by mouth daily.  Dispense: 90 tablet; Refill: 0  2. Vitamin D deficiency Take high dose Vitamin D supplement for 3 months, then recheck levels.   Start- ergocalciferol (VITAMIN D2) 1.25 MG (50000 UT) capsule; Take 1 capsule (50,000 Units total) by mouth once a week.  Dispense: 12  capsule; Refill: 0  3. Obstructive sleep apnea Weight loss therapy and continue CPAP nightly. Weight loss of about 15% may improve apnea-hypopnea index.  4. Alcohol use disorder Pt congratulated on effort in reducing alcohol.  5. Obesity, current BMI 40.7 Billy [name] is currently in the action stage of change. As such, his goal is to continue with weight loss efforts. He has agreed to change to practicing portion control and making smarter food choices, such as increasing vegetables and decreasing simple carbohydrates.   Exercise goals:  As is  Behavioral modification strategies: increasing lean protein intake, decreasing simple carbohydrates, increasing water intake, decreasing alcohol intake, and meal planning and cooking strategies.  Billy [name] has agreed to follow-up with our clinic in 2-3 weeks. He was informed of the importance of frequent follow-up visits to maximize his success with intensive lifestyle modifications for his multiple health conditions.   Objective:   Blood pressure 120/76, pulse 97, temperature 98.1 F (36.7 C), height '6\' 1"'$  (1.854 m), weight (!) 308 lb (139.7 kg), SpO2 92 %. Body mass index is 40.64 kg/m.  General: Cooperative, alert, well developed, in no acute distress. HEENT: Conjunctivae and lids unremarkable. Cardiovascular: Regular rhythm.  Lungs: Normal work of breathing. Neurologic: No focal deficits.   Lab Results  Component Value Date   CREATININE 1.02 01/10/2022   BUN 15 01/10/2022   NA 137 01/10/2022   K 5.1 01/10/2022   CL 95 (L) 01/10/2022   CO2 27 01/10/2022   Lab Results  Component Value Date   ALT 23 01/10/2022   AST 17 01/10/2022   ALKPHOS 59 01/10/2022  BILITOT 0.4 01/10/2022   Lab Results  Component Value Date   HGBA1C 5.6 01/10/2022   HGBA1C 5.6 08/05/2019   HGBA1C 5.2 01/01/2019   HGBA1C 5.6 07/31/2017   Lab Results  Component Value Date   INSULIN 12.6 01/10/2022   Lab Results  Component Value Date   TSH 0.882 01/10/2022    Lab Results  Component Value Date   CHOL 185 01/10/2022   HDL 52 01/10/2022   LDLCALC 114 (H) 01/10/2022   TRIG 104 01/10/2022   CHOLHDL 4 08/05/2019   Lab Results  Component Value Date   VD25OH 19.6 (L) 01/10/2022   Lab Results  Component Value Date   WBC 6.6 01/10/2022   HGB 16.0 01/10/2022   HCT 47.8 01/10/2022   MCV 102 (H) 01/10/2022   PLT 184 01/10/2022   Lab Results  Component Value Date   IRON 174 07/12/2018   TIBC 324 07/12/2018   FERRITIN 224 03/22/2018    Attestation Statements:   Reviewed by clinician on day of visit: allergies, medications, problem list, medical history, surgical history, family history, social history, and previous encounter notes.  Time spent on visit including pre-visit chart review and post-visit care and charting was 40 minutes.   I, Kathlene November, BS, CMA, am acting as transcriptionist for [name], MD.  I have reviewed the above documentation for accuracy and completeness, and I agree with the above. -[name], MD

## 2022-02-07 ENCOUNTER — Encounter (INDEPENDENT_AMBULATORY_CARE_PROVIDER_SITE_OTHER): Payer: Self-pay | Admitting: Internal Medicine

## 2022-02-07 ENCOUNTER — Ambulatory Visit (INDEPENDENT_AMBULATORY_CARE_PROVIDER_SITE_OTHER): Payer: Medicare Other | Admitting: Internal Medicine

## 2022-02-07 VITALS — BP 115/73 | HR 71 | Temp 97.9°F | Ht 73.0 in | Wt 306.0 lb

## 2022-02-07 DIAGNOSIS — E559 Vitamin D deficiency, unspecified: Secondary | ICD-10-CM | POA: Diagnosis not present

## 2022-02-07 DIAGNOSIS — E669 Obesity, unspecified: Secondary | ICD-10-CM

## 2022-02-07 DIAGNOSIS — E538 Deficiency of other specified B group vitamins: Secondary | ICD-10-CM | POA: Diagnosis not present

## 2022-02-07 DIAGNOSIS — F1090 Alcohol use, unspecified, uncomplicated: Secondary | ICD-10-CM | POA: Diagnosis not present

## 2022-02-07 DIAGNOSIS — Z6841 Body Mass Index (BMI) 40.0 and over, adult: Secondary | ICD-10-CM

## 2022-02-07 NOTE — Progress Notes (Signed)
Chief Complaint:   OBESITY Billy [name] is here to discuss his progress with his obesity treatment plan along with follow-up of his obesity related diagnoses. Billy [name] is on the Category 4 Plan + 200 calories and states he is following his eating plan approximately 80% of the time. Billy [name] states he is walking 1 mile 6 times per week.   Today's visit was #: 3 Starting weight: 312 lbs Starting date: 01/10/2022 Today's weight: 306 lbs Today's date: 02/07/2022 Total lbs lost to date: 6 Total lbs lost since last in-office visit: 2  Interim History: Billy [name] presents today for follow-up.  He continues to monitor portions and has lost an additional 2 pounds.  He is using the plate method.  He continues to decrease consumption of beer.  He reports adequate satiety and satiation and denies abnormal cravings.  He has been taking vitamin D and B12 supplementation for deficiency states and notes increase in energy.  He has also been making healthier choices when eating out and has reduced added sugars and starchy foods.  Subjective:   1. B12 deficiency Billy [name] is on B12 with no side effects. I discussed labs wit the patient today.   2. Vitamin D deficiency Billy [name]'s last Vitamin D level was 19.6. He is currently taking prescription vitamin D 50,000 IU each week. He denies nausea, vomiting or muscle weakness.  I discussed labs with the patient today.  3. Alcohol use disorder Billy [name] has improved.  He declined counseling in the past.  Assessment/Plan:   1. B12 deficiency Billy [name] will continue supplementation, and we will recheck level in 2 months.  2. Vitamin D deficiency Billy [name] will continue high-dose vitamin D for 3 months.  Transition to OTC after that.  His vitamin D level goal is 50-60.  3. Alcohol use disorder I congratulated Billy [name] on further reduction.  Recommended topiramate to decrease cravings for beer.  4. Obesity, current BMI 40.5 Billy [name] is currently in the action stage of change. As such, his goal is to  continue with weight loss efforts. He has agreed to keeping a food journal and adhering to recommended goals of 2300 calories and 110 grams of protein.   It is very difficult to objectively determine caloric intake.  I again encouraged him to try to journal using my fitness pal.  He needs some assistance with the use of the app so we will work on this at his appointment in January.  We tried a structured meal plan but he had a difficult time following that.  I also provided him with information regarding topiramate.  I feel this medication will help with appetite reduction and assist him with his efforts to reduce alcohol consumption.  Exercise goals: As is.   Behavioral modification strategies: increasing lean protein intake, increasing vegetables, increasing water intake, no skipping meals, meal planning and cooking strategies, avoiding temptations, and planning for success.  Billy [name] has agreed to follow-up with our clinic in 3 weeks. He was informed of the importance of frequent follow-up visits to maximize his success with intensive lifestyle modifications for his multiple health conditions.   Objective:   Blood pressure 115/73, pulse 71, temperature 97.9 F (36.6 C), height '6\' 1"'$  (1.854 m), weight (!) 306 lb (138.8 kg), SpO2 94 %. Body mass index is 40.37 kg/m.  General: Cooperative, alert, well developed, in no acute distress. HEENT: Conjunctivae and lids unremarkable. Cardiovascular: Regular rhythm.  Lungs: Normal work of breathing. Neurologic: No focal deficits.   Lab Results  Component Value Date  CREATININE 1.02 01/10/2022   BUN 15 01/10/2022   NA 137 01/10/2022   K 5.1 01/10/2022   CL 95 (L) 01/10/2022   CO2 27 01/10/2022   Lab Results  Component Value Date   ALT 23 01/10/2022   AST 17 01/10/2022   ALKPHOS 59 01/10/2022   BILITOT 0.4 01/10/2022   Lab Results  Component Value Date   HGBA1C 5.6 01/10/2022   HGBA1C 5.6 08/05/2019   HGBA1C 5.2 01/01/2019   HGBA1C 5.6  07/31/2017   Lab Results  Component Value Date   INSULIN 12.6 01/10/2022   Lab Results  Component Value Date   TSH 0.882 01/10/2022   Lab Results  Component Value Date   CHOL 185 01/10/2022   HDL 52 01/10/2022   LDLCALC 114 (H) 01/10/2022   TRIG 104 01/10/2022   CHOLHDL 4 08/05/2019   Lab Results  Component Value Date   VD25OH 19.6 (L) 01/10/2022   Lab Results  Component Value Date   WBC 6.6 01/10/2022   HGB 16.0 01/10/2022   HCT 47.8 01/10/2022   MCV 102 (H) 01/10/2022   PLT 184 01/10/2022   Lab Results  Component Value Date   IRON 174 07/12/2018   TIBC 324 07/12/2018   FERRITIN 224 03/22/2018   Attestation Statements:   Reviewed by clinician on day of visit: allergies, medications, problem list, medical history, surgical history, family history, social history, and previous encounter notes.  Time spent on visit including pre-visit chart review and post-visit care and charting was 20 minutes.   [name], am acting as transcriptionist for [name], MD.  I have reviewed the above documentation for accuracy and completeness, and I agree with the above. -[name], MD

## 2022-02-14 ENCOUNTER — Encounter: Payer: Self-pay | Admitting: Primary Care

## 2022-02-14 ENCOUNTER — Ambulatory Visit (INDEPENDENT_AMBULATORY_CARE_PROVIDER_SITE_OTHER): Payer: Medicare Other | Admitting: Primary Care

## 2022-02-14 ENCOUNTER — Ambulatory Visit (INDEPENDENT_AMBULATORY_CARE_PROVIDER_SITE_OTHER): Payer: Medicare Other

## 2022-02-14 VITALS — BP 110/62 | HR 86 | Temp 98.4°F | Ht 73.0 in | Wt 315.0 lb

## 2022-02-14 DIAGNOSIS — G4733 Obstructive sleep apnea (adult) (pediatric): Secondary | ICD-10-CM

## 2022-02-14 DIAGNOSIS — J9691 Respiratory failure, unspecified with hypoxia: Secondary | ICD-10-CM

## 2022-02-14 DIAGNOSIS — J449 Chronic obstructive pulmonary disease, unspecified: Secondary | ICD-10-CM | POA: Diagnosis not present

## 2022-02-14 DIAGNOSIS — J189 Pneumonia, unspecified organism: Secondary | ICD-10-CM

## 2022-02-14 NOTE — Assessment & Plan Note (Signed)
-   Patient is 100% compliant with CPAP > 4 hours last 30 days - No changes

## 2022-02-14 NOTE — Assessment & Plan Note (Signed)
-   Stable; Continue Symbicort 2 puffs twice daily and restart Spiriva handihaler

## 2022-02-14 NOTE — Progress Notes (Signed)
$'@Patient'f$  ID: Billy [name], male    DOB: September 30, 1970, 51 y.o.   MRN: [id number]  Chief Complaint  Patient presents with   Follow-up    7-10 f/u Pneumonia  Breathing has been better.     Referring provider: Dorothyann Peng, NP  HPI: 51 year old male, former smoker quit in January 2020 (12.5-pack-year history).  Past medical history significant for COPD, allergic rhinitis, OSA, respiratory failure with hypoxia, polycythemia vera.  Patient of Dr. Lamonte [name] and follows with Dr. Ander [name] for sleep.  Previous [facility] pulmonary encounter:  01/10/2022 Patient presents today for acute overview due to shortness of breath. Hx COPD and OSA. Pulmonary function testing in September 2021 showed severe obstructive lung disease with significant bronchodilator improvement. FEV1 has improved compared with 06/01/10.  He had two episodes of shortness of breath in the last 2 weeks. He is maintained on Symbicort 160mg. He stopped taking Spiriva Respimat 1 week ago. He prefers handihaler vs respimat. He has been using albuterol more frequently which he feels contributed to his dyspnea symptoms exacerbating on 'Sunday 11/5. Second episode he feels was due to stressing/fixing over his oxygen levels. He reports noticing lower O2 levels at home. He got a new oximeter. He wears oxygen with CPAP at night. He has not had any further episodes of shortness of breath. He has persistent throat clearing cough/upper airway congestion.  Airview download 12/11/21-01/09/22 30/30 days Average 7 hours  Pressure 5-20cm h20 Airleaks 51L/min  AH\\I 3.8    COPD (chronic obstructive pulmonary disease) (HCC) - Uncontrolled; Patient had two episodes shortness of breath in the last 2 weeks. Not currently on LAMA. Using Albuterol rescue inhaler more frequently. Recommend patient continue Symbicort 160 two puffs twice daily. We will resume Spiriva Handier 1 capsule daily (new RX sent). Checking CXR.    Pneumonia - Persistent cough/chest  congestion with increase dyspnea symptoms last 2 weeks. CXR showed vague opacity, treating for suspected pneumonia.  Sent in RX Augmentin 1 tab twice daily x 7 days. Needs repeat CXR in 4 weeks    Respiratory failure with hypoxia (HCC) - Checking walk test in office. Patient uses nocturnal oxygen at night with CPAP. O2 93% at rest on room air. Advised he use supplemental O2 with moderate exertion to keep O2 >88%   Obstructive sleep apnea - OSA well controlled on auto CPAP 5-20cm h20. Patient is 100% compliant with use last 30 days. No changes.      12'$ /19/2023- Interim hx  Patient presents today for for 4-6 week follow-up. During his last visit in November patient was treated for suspected PNA vs COPD exacerbation with course of Augmentin. We resumed Spiriva HandiHaler. CXR showed vague opacity. He wears oxygen at bedtime, advised to use with moderate exertion.   He is feeling a lot better, back to baseline. He completed Augmentin. He has not needed to use daytime oxygen last couple of weeks. No shortness of breath, cough or congestion. Using inhalers as prescribed. Wearing CPAP nightly with oxygen.   Airview download 11/19/233-02/13/22 30/30 days > 4 hours Average usage 7 hours 31 mins Pressure 5-20cm h20 (10.5cm h20-95%) Airleaks 58.2L/min (95%) AHI 4.6   Allergies  Allergen Reactions   Ativan [Lorazepam] Other (See Comments)    Pt given Ativan.  Pt became unresponsive.    There is no immunization history for the selected administration types on file for this patient.  Past Medical History:  Diagnosis Date   Alcohol use    Allergy    as a child-  none as adult    COPD (chronic obstructive pulmonary disease) (HCC)    Drug use    Edema of both lower extremities    History of bronchitis    Hypertension    Obesity    Oxygen deficiency    Pt uses 02  3 liters with his CPAP ONLY    PFO (patent foramen ovale)    Polycythemia    a. Dr. Marin [name].Marland KitchenMarland KitchenJAK-2 analysis is negative.  b.  likely physiologic secondary to chronic hypoxia (? OSA + COPD + obesity).  c. s/p plebotomy x1   PVD (peripheral vascular disease) (HCC)    Sleep apnea    a. mod severe by sleep study 9.30.11: AHI 44.8 per hour; oxygen desat to a nadir of 65%; unsuccessful CPAP titration; significant hypoxemia even with supp o2 at 3LPM (dest to 70-80%); needs eval for cardiopulmonary disease and upper airway obstruction   SOB (shortness of breath)    Substance abuse (HCC)    alcoholism    Tobacco abuse     Tobacco History: Social History   Tobacco Use  Smoking Status Former   Packs/day: 0.50   Years: 25.00   Total pack years: 12.50   Types: Cigarettes   Quit date: 03/21/2018   Years since quitting: 3.9  Smokeless Tobacco Never   Counseling given: Not Answered   Outpatient Medications Prior to Visit  Medication Sig Dispense Refill   albuterol (VENTOLIN HFA) 108 (90 Base) MCG/ACT inhaler Inhale 1-2 puffs into the lungs every 6 (six) hours as needed for wheezing or shortness of breath. 8.5 each 4   aspirin 81 MG tablet Take 2 tablets (161 mg total) by mouth daily. 30 tablet 0   cyanocobalamin (VITAMIN B12) 1000 MCG tablet Take 1 tablet (1,000 mcg total) by mouth daily. 90 tablet 0   ergocalciferol (VITAMIN D2) 1.25 MG (50000 UT) capsule Take 1 capsule (50,000 Units total) by mouth once a week. 12 capsule 0   SYMBICORT 160-4.5 MCG/ACT inhaler TAKE 2 PUFFS BY MOUTH TWICE A DAY 10.2 each 11   valsartan (DIOVAN) 160 MG tablet Take 1 tablet (160 mg total) by mouth daily. 30 tablet 11   amoxicillin-clavulanate (AUGMENTIN) 875-125 MG tablet Take 1 tablet by mouth 2 (two) times daily. (Patient not taking: Reported on 02/14/2022) 14 tablet 0   Tiotropium Bromide Monohydrate (SPIRIVA RESPIMAT) 2.5 MCG/ACT AERS Inhale 2 puffs into the lungs daily. (Patient not taking: Reported on 02/14/2022) 4 g 5   No facility-administered medications prior to visit.   Review of Systems  Review of Systems  Constitutional:  Negative.   HENT: Negative.    Respiratory: Negative.  Negative for cough and shortness of breath.   Cardiovascular: Negative.    Physical Exam  BP 110/62 (BP Location: Left Arm, Patient Position: Sitting, Cuff Size: Large)   Pulse 86   Temp 98.4 F (36.9 C) (Oral)   Ht '6\' 1"'$  (1.854 m)   Wt (!) 315 lb (142.9 kg)   SpO2 91%   BMI 41.56 kg/m  Physical Exam Constitutional:      General: He is not in acute distress.    Appearance: Normal appearance. He is not ill-appearing.  HENT:     Head: Normocephalic and atraumatic.     Mouth/Throat:     Mouth: Mucous membranes are moist.     Pharynx: Oropharynx is clear.  Cardiovascular:     Rate and Rhythm: Normal rate and regular rhythm.  Pulmonary:     Effort: Pulmonary effort is normal.  Breath sounds: Normal breath sounds. No wheezing, rhonchi or rales.  Skin:    General: Skin is warm and dry.  Neurological:     General: No focal deficit present.     Mental Status: He is alert and oriented to person, place, and time. Mental status is at baseline.  Psychiatric:        Mood and Affect: Mood normal.        Behavior: Behavior normal.        Thought Content: Thought content normal.        Judgment: Judgment normal.      Lab Results:  CBC    Component Value Date/Time   WBC 6.6 01/10/2022 0952   WBC 10.9 (H) 05/27/2020 1220   RBC 4.71 01/10/2022 0952   RBC 4.35 05/27/2020 1220   HGB 16.0 01/10/2022 0952   HGB 17.9 (H) 12/16/2014 1407   HCT 47.8 01/10/2022 0952   HCT 52.5 (H) 12/16/2014 1407   PLT 184 01/10/2022 0952   MCV 102 (H) 01/10/2022 0952   MCV 101 (H) 12/16/2014 1407   MCH 34.0 (H) 01/10/2022 0952   MCH 35.4 (H) 07/12/2018 1602   MCHC 33.5 01/10/2022 0952   MCHC 34.5 05/27/2020 1220   RDW 11.8 01/10/2022 0952   RDW 12.1 12/16/2014 1407   LYMPHSABS 1.2 01/10/2022 0952   LYMPHSABS 2.0 12/16/2014 1407   MONOABS 0.2 05/27/2020 1220   EOSABS 0.2 01/10/2022 0952   EOSABS 0.2 12/16/2014 1407   BASOSABS 0.1  01/10/2022 0952   BASOSABS 0.1 12/16/2014 1407    BMET    Component Value Date/Time   NA 137 01/10/2022 0952   NA 139 12/16/2014 1407   K 5.1 01/10/2022 0952   K 4.4 12/16/2014 1407   CL 95 (L) 01/10/2022 0952   CL 98 12/08/2013 0840   CO2 27 01/10/2022 0952   CO2 28 12/16/2014 1407   GLUCOSE 93 01/10/2022 0952   GLUCOSE 102 (H) 08/05/2019 1015   GLUCOSE 93 12/16/2014 1407   GLUCOSE 110 12/08/2013 0840   BUN 15 01/10/2022 0952   BUN 13.4 12/16/2014 1407   CREATININE 1.02 01/10/2022 0952   CREATININE 1.30 07/12/2018 1602   CREATININE 0.9 12/16/2014 1407   CALCIUM 9.7 01/10/2022 0952   CALCIUM 9.5 12/16/2014 1407   GFRNONAA >60 03/26/2018 0315   GFRAA >60 03/26/2018 0315    BNP    Component Value Date/Time   BNP 39.4 03/22/2018 1011    ProBNP    Component Value Date/Time   PROBNP 30.0 05/30/2010 1556    Imaging: No results found.   Assessment & Plan:   Pneumonia - Clinically improved. Completed course Augmentin.  - Needs repeat CXR today   COPD (chronic obstructive pulmonary disease) (HCC) - Stable; Continue Symbicort 2 puffs twice daily and restart Spiriva handihaler  Respiratory failure with hypoxia ([facility]) - Performed walk test in office today on room air, lowest's O2 dropped was 88% but recovered quickly to mid 90s - Continue oxygen at bedtime with CPAP and prn with moderate-heavy exertion   Obstructive sleep apnea - Patient is 100% compliant with CPAP > 4 hours last 30 days - No changes    Martyn Ehrich, NP 02/14/2022

## 2022-02-14 NOTE — Assessment & Plan Note (Signed)
-   Clinically improved. Completed course Augmentin.  - Needs repeat CXR today

## 2022-02-14 NOTE — Assessment & Plan Note (Addendum)
-   Performed walk test in office today on room air, lowest's O2 dropped was 88% but recovered quickly to mid 90s - Continue oxygen at bedtime with CPAP and prn with moderate-heavy exertion

## 2022-02-14 NOTE — Patient Instructions (Addendum)
Glad you are feeling better Billy [name]  Recommendations: Continue to wear CPAP every night 4-6 hours or more Continue oxygen at bedtime with CPAP  Continue Symbicort two puffs twice daily  Restart Spiriva handihaler if you have not done so   Orders: CXR today (ordered) Walk test today with forehead probe   Follow-up: Recall for 3 months with Billy [name] (should already be in)

## 2022-02-26 DIAGNOSIS — G4733 Obstructive sleep apnea (adult) (pediatric): Secondary | ICD-10-CM | POA: Diagnosis not present

## 2022-03-07 ENCOUNTER — Ambulatory Visit (INDEPENDENT_AMBULATORY_CARE_PROVIDER_SITE_OTHER): Payer: Medicare Other | Admitting: Internal Medicine

## 2022-03-08 ENCOUNTER — Encounter (INDEPENDENT_AMBULATORY_CARE_PROVIDER_SITE_OTHER): Payer: Self-pay | Admitting: Internal Medicine

## 2022-03-08 ENCOUNTER — Ambulatory Visit (INDEPENDENT_AMBULATORY_CARE_PROVIDER_SITE_OTHER): Payer: Medicare Other | Admitting: Internal Medicine

## 2022-03-08 VITALS — BP 99/67 | HR 80 | Temp 97.9°F | Ht 73.0 in | Wt 309.0 lb

## 2022-03-08 DIAGNOSIS — F1090 Alcohol use, unspecified, uncomplicated: Secondary | ICD-10-CM | POA: Diagnosis not present

## 2022-03-08 DIAGNOSIS — E538 Deficiency of other specified B group vitamins: Secondary | ICD-10-CM

## 2022-03-08 DIAGNOSIS — E669 Obesity, unspecified: Secondary | ICD-10-CM | POA: Diagnosis not present

## 2022-03-08 DIAGNOSIS — E559 Vitamin D deficiency, unspecified: Secondary | ICD-10-CM

## 2022-03-08 DIAGNOSIS — Z6841 Body Mass Index (BMI) 40.0 and over, adult: Secondary | ICD-10-CM

## 2022-03-08 MED ORDER — TOPIRAMATE 25 MG PO TABS: 25.0000 mg | ORAL_TABLET | Freq: Every day | ORAL | 0 refills | Status: DC

## 2022-03-08 NOTE — Progress Notes (Signed)
Chief Complaint:   OBESITY Billy [name] is here to discuss his progress with his obesity treatment plan along with follow-up of his obesity related diagnoses. Billy [name] is on keeping a food journal and adhering to recommended goals of 2300 calories and 110 grams of protein and states he is following his eating plan approximately 35-40% of the time. Billy [name] states he is walking 2.5-5 miles 5 times per week.    Today's visit was #: 4 Starting weight: 312 lbs Starting date: 01/10/2022 Today's weight: 309 lbs Today's date: 03/08/2022 Total lbs lost to date: 3 Total lbs lost since last in-office visit: 0  Interim History: Billy [name] presents today for weight management.  Since last office visit he has gained 3 pounds.  He encountered some difficulties during the holidays.  He also had difficulties downloading a calorie tracking app.  Adherence to prescribed nutritional plan is fair.  He is physically active and walking several miles 5 times per week.  He is interested in cutting down beer and remains engaged then motivated but continues to snack on calorie dense foods.  He notes increased cravings later in the day as well as hunger signals.  He does not skip meals  Subjective:   1. Alcohol use disorder Improved.  Patient would like to reduce alcohol consumption further.  He notes cravings in the evenings.  2. B12 deficiency On supplementation.  No side effects.  3. Vitamin D deficiency Patient on high-dose vitamin D without any adverse effects.  Associated with excess adiposity and may result in leptin resistance and adipogenesis.  Assessment/Plan:   1. Alcohol use disorder In addition to counseling and after discussion of benefits, risks and side effects he is agreeable to starting topiramate 25 mg in the evening.  2. B12 deficiency Continue B12 supplementation.  Check B12 levels at the next office visit.  3. Vitamin D deficiency Continue vitamin D supplementation.  Check levels in 3 months.  4.  Obesity, current BMI 40.5 Plan: Patient's adherence to prescribed nutrition plan is suboptimal.  He would like more flexibility and agrees to journaling.  We assisted patient in downloading application, answering targets and logging food.  He will work on journaling over the next 3 weeks.  In addition to journaling and increasing physical activity I feel this patient would benefit from pharmacotherapy to assist with hunger signals, satiety and cravings.  After discussion of mechanisms of action, benefits, common side effects, off label use and shared decision making he is agreeable to starting topiramate 25 mg in the evening.  - topiramate (TOPAMAX) 25 MG tablet; Take 1 tablet (25 mg total) by mouth daily.  Dispense: 30 tablet; Refill: 0  Billy [name] is currently in the action stage of change. As such, his goal is to continue with weight loss efforts. He has agreed to change to keeping a food journal and adhering to recommended goals of 2300 calories and 100 grams of protein daily.   Started Estate manager/land agent.   Exercise goals: As is.   Behavioral modification strategies: increasing lean protein intake, decreasing simple carbohydrates, increasing water intake, decreasing alcohol intake, keeping healthy foods in the home, better snacking choices, and planning for success.  Billy [name] has agreed to follow-up with our clinic in 3 weeks. He was informed of the importance of frequent follow-up visits to maximize his success with intensive lifestyle modifications for his multiple health conditions.   Objective:   Blood pressure 99/67, pulse 80, temperature 97.9 F (36.6 C), height '6\' 1"'$  (1.854 m), weight Marland Kitchen)  309 lb (140.2 kg), SpO2 93 %. Body mass index is 40.77 kg/m.  General: Cooperative, alert, well developed, in no acute distress. HEENT: Conjunctivae and lids unremarkable. Cardiovascular: Regular rhythm.  Lungs: Normal work of breathing. Neurologic: No focal deficits.   Lab Results  Component Value Date    CREATININE 1.02 01/10/2022   BUN 15 01/10/2022   NA 137 01/10/2022   K 5.1 01/10/2022   CL 95 (L) 01/10/2022   CO2 27 01/10/2022   Lab Results  Component Value Date   ALT 23 01/10/2022   AST 17 01/10/2022   ALKPHOS 59 01/10/2022   BILITOT 0.4 01/10/2022   Lab Results  Component Value Date   HGBA1C 5.6 01/10/2022   HGBA1C 5.6 08/05/2019   HGBA1C 5.2 01/01/2019   HGBA1C 5.6 07/31/2017   Lab Results  Component Value Date   INSULIN 12.6 01/10/2022   Lab Results  Component Value Date   TSH 0.882 01/10/2022   Lab Results  Component Value Date   CHOL 185 01/10/2022   HDL 52 01/10/2022   LDLCALC 114 (H) 01/10/2022   TRIG 104 01/10/2022   CHOLHDL 4 08/05/2019   Lab Results  Component Value Date   VD25OH 19.6 (L) 01/10/2022   Lab Results  Component Value Date   WBC 6.6 01/10/2022   HGB 16.0 01/10/2022   HCT 47.8 01/10/2022   MCV 102 (H) 01/10/2022   PLT 184 01/10/2022   Lab Results  Component Value Date   IRON 174 07/12/2018   TIBC 324 07/12/2018   FERRITIN 224 03/22/2018   Attestation Statements:   Reviewed by clinician on day of visit: allergies, medications, problem list, medical history, surgical history, family history, social history, and previous encounter notes.   [name], am acting as transcriptionist for [name], MD.  I have reviewed the above documentation for accuracy and completeness, and I agree with the above. -[name], MD

## 2022-03-29 DIAGNOSIS — G4733 Obstructive sleep apnea (adult) (pediatric): Secondary | ICD-10-CM | POA: Diagnosis not present

## 2022-03-30 ENCOUNTER — Ambulatory Visit (INDEPENDENT_AMBULATORY_CARE_PROVIDER_SITE_OTHER): Payer: 59 | Admitting: Internal Medicine

## 2022-04-07 ENCOUNTER — Telehealth: Payer: Self-pay | Admitting: Adult Health

## 2022-04-07 ENCOUNTER — Encounter: Payer: Self-pay | Admitting: Adult Health

## 2022-04-07 ENCOUNTER — Other Ambulatory Visit: Payer: Self-pay | Admitting: Adult Health

## 2022-04-07 ENCOUNTER — Telehealth (INDEPENDENT_AMBULATORY_CARE_PROVIDER_SITE_OTHER): Payer: 59 | Admitting: Adult Health

## 2022-04-07 VITALS — Ht 73.0 in | Wt 300.0 lb

## 2022-04-07 DIAGNOSIS — J988 Other specified respiratory disorders: Secondary | ICD-10-CM | POA: Diagnosis not present

## 2022-04-07 DIAGNOSIS — F39 Unspecified mood [affective] disorder: Secondary | ICD-10-CM

## 2022-04-07 MED ORDER — PREDNISONE 20 MG PO TABS: 20.0000 mg | ORAL_TABLET | Freq: Every day | ORAL | 0 refills | Status: DC

## 2022-04-07 NOTE — Progress Notes (Signed)
Virtual Visit via Video Note  I connected with Billy [name] on 04/07/22 at  3:00 PM EST by a video enabled telemedicine application and verified that I am speaking with the correct person using two identifiers.  Location patient: home Location provider:work or home office Persons participating in the virtual visit: patient, provider  I discussed the limitations of evaluation and management by telemedicine and the availability of in person appointments. The patient expressed understanding and agreed to proceed.   HPI: 52 year old male who is being evaluated today for an acute issue.  He reports that his symptoms started 2 days ago.  Symptoms include rhinorrhea with clear drainage, and chest congestion.  He does have a productive cough with discolored sputum from time to time.  At home he has been using Zicam without relief as well as his albuterol and Spiriva inhalers.  He has not had any fevers, chills, sinus pain or pressure.  He is a former smoker.   ROS: See pertinent positives and negatives per HPI.  Past Medical History:  Diagnosis Date   Alcohol use    Allergy    as a child- none as adult    COPD (chronic obstructive pulmonary disease) ([place])    Drug use    Edema of both lower extremities    History of bronchitis    Hypertension    Obesity    Oxygen deficiency    Pt uses 02  3 liters with his CPAP ONLY    PFO (patent foramen ovale)    Polycythemia    a. Dr. Marin Olp.Marland KitchenMarland KitchenJAK-2 analysis is negative.  b. likely physiologic secondary to chronic hypoxia (? OSA + COPD + obesity).  c. s/p plebotomy x1   PVD (peripheral vascular disease) (HCC)    Sleep apnea    a. mod severe by sleep study 9.30.11: AHI 44.8 per hour; oxygen desat to a nadir of 65%; unsuccessful CPAP titration; significant hypoxemia even with supp o2 at 3LPM (dest to 70-80%); needs eval for cardiopulmonary disease and upper airway obstruction   SOB (shortness of breath)    Substance abuse ([place])    alcoholism     Tobacco abuse     Past Surgical History:  Procedure Laterality Date   knee injury  [place]     as child     Family History  Problem Relation Age of Onset   Other Mother        Wegener's Disease   Thyroid disease Mother    Alcoholism Mother    Eating disorder Mother    Obesity Mother    Drug abuse Father    Alcohol abuse Father    Asthma Father    Eating disorder Father    Obesity Father    Lung cancer Paternal Grandmother    Colon cancer Neg Hx    Colon polyps Neg Hx    Esophageal cancer Neg Hx    Rectal cancer Neg Hx    Stomach cancer Neg Hx        Current Outpatient Medications:    albuterol (VENTOLIN HFA) 108 (90 Base) MCG/ACT inhaler, Inhale 1-2 puffs into the lungs every 6 (six) hours as needed for wheezing or shortness of breath., Disp: 8.5 each, Rfl: 4   amoxicillin-clavulanate (AUGMENTIN) 875-125 MG tablet, Take 1 tablet by mouth 2 (two) times daily., Disp: 14 tablet, Rfl: 0   aspirin 81 MG tablet, Take 2 tablets (161 mg total) by mouth daily., Disp: 30  tablet, Rfl: 0   cyanocobalamin (VITAMIN B12) 1000 MCG tablet, Take 1 tablet (1,000 mcg total) by mouth daily., Disp: 90 tablet, Rfl: 0   ergocalciferol (VITAMIN D2) 1.25 MG (50000 UT) capsule, Take 1 capsule (50,000 Units total) by mouth once a week., Disp: 12 capsule, Rfl: 0   SYMBICORT 160-4.5 MCG/ACT inhaler, TAKE 2 PUFFS BY MOUTH TWICE A DAY, Disp: 10.2 each, Rfl: 11   tadalafil, PAH, (ADCIRCA) 20 MG tablet, Take 20 mg by mouth daily as needed., Disp: , Rfl:    Tiotropium Bromide Monohydrate (SPIRIVA RESPIMAT) 2.5 MCG/ACT AERS, Inhale 2 puffs into the lungs daily., Disp: 4 g, Rfl: 5   topiramate (TOPAMAX) 25 MG tablet, Take 1 tablet (25 mg total) by mouth daily., Disp: 30 tablet, Rfl: 0   valsartan (DIOVAN) 160 MG tablet, Take 1 tablet (160 mg total) by mouth daily., Disp: 30 tablet, Rfl: 11  EXAM:  VITALS per patient if applicable:  GENERAL: alert,  oriented, appears well and in no acute distress  HEENT: atraumatic, conjunttiva clear, no obvious abnormalities on inspection of external nose and ears  NECK: normal movements of the head and neck  LUNGS: on inspection no signs of respiratory distress, breathing rate appears normal, no obvious gross SOB, gasping or wheezing  CV: no obvious cyanosis  MS: moves all visible extremities without noticeable abnormality  PSYCH/NEURO: pleasant and cooperative, no obvious depression or anxiety, speech and thought processing grossly intact  ASSESSMENT AND PLAN:  Discussed the following assessment and plan:  1. Respiratory infection -Symptoms have been present for 2 days, will refrain from starting on antibiotic time.  Will place him on 5 days of prednisone to help with chest congestion.  You to use inhalers as directed.  Follow-up next week in the office if symptoms or not improving. - predniSONE (DELTASONE) 20 MG tablet; Take 1 tablet (20 mg total) by mouth daily with breakfast.  Dispense: 5 tablet; Refill: 0      I discussed the assessment and treatment plan with the patient. The patient was provided an opportunity to ask questions and all were answered. The patient agreed with the plan and demonstrated an understanding of the instructions.   The patient was advised to call back or seek an in-person evaluation if the symptoms worsen or if the condition fails to improve as anticipated.   Dorothyann Peng, NP

## 2022-04-07 NOTE — Telephone Encounter (Signed)
The original prescription was discontinued on 12/20/2021 by Leeann Must, CMA for the following reason: Completed Course. Renewing this prescription may not be appropriate.

## 2022-04-07 NOTE — Telephone Encounter (Signed)
Pt girlfriend was dx with yeast infection today and pt [facility]  [id number] [place] [address]: [phone] Fax: [phone]

## 2022-04-10 ENCOUNTER — Ambulatory Visit (INDEPENDENT_AMBULATORY_CARE_PROVIDER_SITE_OTHER): Payer: 59 | Admitting: Family Medicine

## 2022-04-10 ENCOUNTER — Other Ambulatory Visit: Payer: Self-pay

## 2022-04-10 VITALS — BP 102/64 | HR 87 | Temp 98.2°F | Ht 73.0 in | Wt 317.4 lb

## 2022-04-10 DIAGNOSIS — R0989 Other specified symptoms and signs involving the circulatory and respiratory systems: Secondary | ICD-10-CM | POA: Diagnosis not present

## 2022-04-10 DIAGNOSIS — H65193 Other acute nonsuppurative otitis media, bilateral: Secondary | ICD-10-CM

## 2022-04-10 DIAGNOSIS — J3089 Other allergic rhinitis: Secondary | ICD-10-CM

## 2022-04-10 DIAGNOSIS — R051 Acute cough: Secondary | ICD-10-CM | POA: Diagnosis not present

## 2022-04-10 DIAGNOSIS — J449 Chronic obstructive pulmonary disease, unspecified: Secondary | ICD-10-CM | POA: Diagnosis not present

## 2022-04-10 DIAGNOSIS — R0982 Postnasal drip: Secondary | ICD-10-CM | POA: Diagnosis not present

## 2022-04-10 LAB — POC COVID19 BINAXNOW: SARS Coronavirus 2 Ag: NEGATIVE

## 2022-04-10 MED ORDER — ALBUTEROL SULFATE HFA 108 (90 BASE) MCG/ACT IN AERS: 1.0000 | INHALATION_SPRAY | Freq: Four times a day (QID) | RESPIRATORY_TRACT | 0 refills | Status: DC | PRN

## 2022-04-10 NOTE — Patient Instructions (Addendum)
You can use an over-the-counter antihistamine such as Claritin, Zyrtec, Allegra, Xyzal or nasal spray such as Flonase to help with the effusion in both ears.  An effusion is basically fluid behind your eardrum.  This can be caused by allergy symptoms.  A refill on your albuterol inhaler was sent to the pharmacy.  You can also reach out to your pulmonology office to ensure that you have further refills.

## 2022-04-10 NOTE — Progress Notes (Signed)
   Established Patient Office Visit   Subjective  Patient ID: Billy [name], male    DOB: 06/30/1970  Age: 52 y.o. MRN: [id number]  Chief Complaint  Patient presents with   Nasal Congestion    Pt reports sx of nasal congestion, runny nose. Coughing up green phlegm.     Pt is a 52 yo male with pmh sig for COPD, PVD, allergic rhinitis, thrombocytopenia, obesity, polycythemia, OSA, alcohol abuse, former smoker who is followed by Dorothyann Peng, NP and seen for acute concern.  Patient endorses feeling of rattling in chest that turned into cough 04/05/2022.  Patient seen virtually 04/07/2022 started on prednisone 20 mg x 5 days.  Patient endorses continued symptoms of productive cough.  Notes rhinorrhea worse at night/in the morning and some postnasal drainage.  Denies headache, fever, body aches, CP, sore throat, ear pain/pressure.  Tried elderberry, Zicam without improvement in symptoms.  Patient has pet cats and dogs at home.  States that the child had allergy to dogs.      ROS Negative unless stated above    Objective:     BP 102/64 (BP Location: Right Arm, Patient Position: Sitting, Cuff Size: Large)   Pulse 87   Temp 98.2 F (36.8 C) (Oral)   Ht '6\' 1"'$  (1.854 m)   Wt (!) 317 lb 6.4 oz (144 kg)   SpO2 91%   BMI 41.88 kg/m    Physical Exam Constitutional:      General: He is not in acute distress.    Appearance: Normal appearance.  HENT:     Head: Normocephalic and atraumatic.     Jaw: No tenderness.     Nose: Nose normal.     Mouth/Throat:     Mouth: Mucous membranes are moist.     Dentition: Has dentures.     Pharynx: Posterior oropharyngeal erythema present.     Tonsils: No tonsillar exudate.  Cardiovascular:     Rate and Rhythm: Normal rate and regular rhythm.     Heart sounds: Normal heart sounds. No murmur heard.    No gallop.  Pulmonary:     Effort: Pulmonary effort is normal. No respiratory distress.     Breath sounds: Normal breath sounds. No wheezing,  rhonchi or rales.  Skin:    General: Skin is warm and dry.  Neurological:     Mental Status: He is alert and oriented to person, place, and time.      Results for orders placed or performed in visit on 04/10/22  POC COVID-19  Result Value Ref Range   SARS Coronavirus 2 Ag Negative Negative      Assessment & Plan:  Non-seasonal allergic rhinitis, unspecified trigger  Acute effusion of both middle ears  Runny nose -     POC COVID-19 BinaxNow  Acute cough -     POC COVID-19 BinaxNow  Post-nasal drainage  Chronic obstructive pulmonary disease, unspecified COPD type (HCC) -     Albuterol Sulfate HFA; Inhale 1-2 puffs into the lungs every 6 (six) hours as needed for wheezing or shortness of breath.  Dispense: 8.5 each; Refill: 0  COVID testing negative.  Cough due to postnasal drainage.  Bilateral ear effusion noted on exam.  Start OTC antihistamine.  Given sample of Zyrtec.  Continue inhalers as needed.  Albuterol inhaler refilled.  For further refills see PCP or pulmonology.  Okay to complete prednisone.  No follow-ups on file.   Billie Ruddy, MD

## 2022-04-13 ENCOUNTER — Telehealth: Payer: Self-pay | Admitting: Adult Health

## 2022-04-13 NOTE — Telephone Encounter (Signed)
Patient calling in to follow up from vv on 04/07/22 says the steroids were not good for him and he is requesting an antibiotic. Says he is coughing up thick green phlegm. Patient [facility] a [id number] [place] [address] [phone] Fax: [phone]

## 2022-04-13 NOTE — Telephone Encounter (Signed)
Do you think you can help me with this in Cory's absence please?

## 2022-04-14 ENCOUNTER — Other Ambulatory Visit: Payer: Self-pay | Admitting: Family Medicine

## 2022-04-14 DIAGNOSIS — J441 Chronic obstructive pulmonary disease with (acute) exacerbation: Secondary | ICD-10-CM

## 2022-04-14 MED ORDER — AMOXICILLIN 500 MG PO TABS: 500.0000 mg | ORAL_TABLET | Freq: Two times a day (BID) | ORAL | 0 refills | Status: AC

## 2022-04-14 NOTE — Telephone Encounter (Signed)
Pt notified that Rx called into the pharmacy by Dr. Volanda Napoleon. No further action needed.

## 2022-04-14 NOTE — Progress Notes (Signed)
Rx for amoxicillin sent to pharmacy.  For continued or worsening symptoms patient needs to be seen in person.

## 2022-04-20 MED ORDER — ALBUTEROL SULFATE HFA 108 (90 BASE) MCG/ACT IN AERS: 1.0000 | INHALATION_SPRAY | Freq: Four times a day (QID) | RESPIRATORY_TRACT | 5 refills | Status: DC | PRN

## 2022-04-20 NOTE — Telephone Encounter (Signed)
Dr. Volanda Napoleon sent in temporarily refill.   Please have his pulmonology sent his further refills. Thank you!

## 2022-04-27 DIAGNOSIS — G4733 Obstructive sleep apnea (adult) (pediatric): Secondary | ICD-10-CM | POA: Diagnosis not present

## 2022-05-08 NOTE — Progress Notes (Unsigned)
Care Management & Coordination Services Pharmacy Note  05/08/2022 Name:  Billy [name] MRN:  [id number] DOB:  1970-11-29  Summary: ***  Recommendations/Changes made from today's visit: ***  Follow up plan: ***   Subjective: Billy [name] is an 51 y.o. year old male who is a primary patient of Dorothyann Peng, NP.  The care coordination team was consulted for assistance with disease management and care coordination needs.    {CCMTELEPHONEFACETOFACE:21091510} for follow up visit.  Recent office visits: 04/10/22 For allergic rhinitis, stop augmentin. Start amoxicillin (2/16)  04/07/22 Dorothyann Peng, NP - For respiratory infection. Start prednisone '20mg'$   12/27/21 Rolene Arbour, LPN, For AWV  Recent consult visits: 03/08/22 [name], MD [facility]) - For alcohol use dx, start topiramate '25mg'$  daily  02/14/22 Geraldo Pitter, NP (Pulm) For PNA, no med changes  02/07/22 [name], MD [facility]) - No med changes  01/24/22 Gwenyth Bouillon, MD [facility]) - Low B12, Vit D. Start B12 1074mg daily and Vit D 50,000 weekly  01/10/22 EMartyn Ehrich NP (Pulm) For SOB, Start Augmentin  01/10/22 [facility]omes [facility]) - No med changes  12/27/21 AEvette Georges MD (Pulm) - Phone call, restart Spiriva 2.540m  12/20/21 Ed[facility]omes [facility]ningMD ([facility]) - Stop Bupropion and Tramadol  Hospital visits: None in previous 6 months   Objective:  Lab Results  Component Value Date   CREATININE 1.02 01/10/2022   BUN 15 01/10/2022   GFR 81.94 08/05/2019   EGFR 88 01/10/2022   GFRNONAA >60 03/26/2018   GFRAA >60 03/26/2018   NA 137 01/10/2022   K 5.1 01/10/2022   CALCIUM 9.7 01/10/2022   CO2 27 01/10/2022   GLUCOSE 93 01/10/2022    Lab Results  Component Value Date/Time   HGBA1C 5.6 01/10/2022 09:52 AM   HGBA1C 5.6 08/05/2019 10:15 AM   GFR 81.94 08/05/2019 10:15 AM   GFR 79.30 01/01/2019 04:08  PM    Last diabetic Eye exam: No results found for: "HMDIABEYEEXA"  Last diabetic Foot exam: No results found for: "HMDIABFOOTEX"   Lab Results  Component Value Date   CHOL 185 01/10/2022   HDL 52 01/10/2022   LDLCALC 114 (H) 01/10/2022   TRIG 104 01/10/2022   CHOLHDL 4 08/05/2019       Latest Ref Rng & Units 01/10/2022    9:52 AM 08/05/2019   10:15 AM 07/12/2018    4:02 PM  Hepatic Function  Total Protein 6.0 - 8.5 g/dL 6.8  6.9  6.5   Albumin 3.8 - 4.9 g/dL 4.7  4.6    AST 0 - 40 IU/L '17  23  23   '$ ALT 0 - 44 IU/L '23  22  27   '$ Alk Phosphatase 44 - 121 IU/L 59  46    Total Bilirubin 0.0 - 1.2 mg/dL 0.4  0.9  0.8     Lab Results  Component Value Date/Time   TSH 0.882 01/10/2022 09:52 AM   TSH 1.96 08/05/2019 10:15 AM       Latest Ref Rng & Units 01/10/2022    9:52 AM 05/27/2020   12:20 PM 08/05/2019   10:15 AM  CBC  WBC 3.4 - 10.8 x10E3/uL 6.6  10.9  7.8   Hemoglobin 13.0 - 17.7 g/dL 16.0  15.3  16.5   Hematocrit 37.5 - 51.0 % 47.8  44.2  47.9   Platelets 150 - 450 x10E3/uL 184  175.0  155.0  Lab Results  Component Value Date/Time   VD25OH 19.6 (L) 01/10/2022 09:52 AM   VITAMINB12 228 (L) 01/10/2022 09:52 AM   VITAMINB12 140 (L) 07/31/2017 01:40 PM    Clinical ASCVD: No  The 10-year ASCVD risk score (Arnett DK, et al., 2019) is: 2.9%   Values used to calculate the score:     Age: 59 years     Sex: Male     Is Non-Hispanic African American: No     Diabetic: No     Tobacco smoker: No     Systolic Blood Pressure: [id number] mmHg     Is BP treated: Yes     HDL Cholesterol: 52 mg/dL     Total Cholesterol: 185 mg/dL        04/10/2022    2:51 PM 12/27/2021    1:26 PM 05/26/2021   10:08 AM  Depression screen PHQ 2/9  Decreased Interest 0 0 0  Down, Depressed, Hopeless 0 0 0  PHQ - 2 Score 0 0 0  Altered sleeping 0    Tired, decreased energy 0    Change in appetite 0    Feeling bad or failure about yourself  0    Trouble concentrating 0    Moving slowly or  fidgety/restless 0    Suicidal thoughts 0    PHQ-9 Score 0       Social History   Tobacco Use  Smoking Status Former   Packs/day: 0.50   Years: 25.00   Total pack years: 12.50   Types: Cigarettes   Quit date: 03/21/2018   Years since quitting: 4.1  Smokeless Tobacco Never   BP Readings from Last 3 Encounters:  04/10/22 102/64  03/08/22 99/67  02/14/22 110/62   Pulse Readings from Last 3 Encounters:  04/10/22 87  03/08/22 80  02/14/22 86   Wt Readings from Last 3 Encounters:  04/10/22 (!) 317 lb 6.4 oz (144 kg)  04/07/22 300 lb (136.1 kg)  03/08/22 (!) 309 lb (140.2 kg)   BMI Readings from Last 3 Encounters:  04/10/22 41.88 kg/m  04/07/22 39.58 kg/m  03/08/22 40.77 kg/m    Allergies  Allergen Reactions   Ativan [Lorazepam] Other (See Comments)    Pt given Ativan.  Pt became unresponsive.    Medications Reviewed Today     Reviewed by Encarnacion Slates, [place] (Certified Medical Assistant) on 04/10/22 at 1440  Med List Status: <None>   Medication Order Taking? Sig Documenting Provider Last Dose Status Informant  albuterol (VENTOLIN HFA) 108 (90 Base) MCG/ACT inhaler [id number] Yes Inhale 1-2 puffs into the lungs every 6 (six) hours as needed for wheezing or shortness of breath. Collene Gobble, MD Taking Active   aspirin 81 MG tablet [id number] Yes Take 2 tablets (161 mg total) by mouth daily. Jonetta Osgood, MD Taking Active   cyanocobalamin (VITAMIN B12) 1000 MCG tablet [id number] Yes Take 1 tablet (1,000 mcg total) by mouth daily. [name], MD Taking Active   ergocalciferol (VITAMIN D2) 1.25 MG (50000 UT) capsule [id number] Yes Take 1 capsule (50,000 Units total) by mouth once a week. [name], MD Taking Active   predniSONE (DELTASONE) 20 MG tablet [id number] Yes Take 1 tablet (20 mg total) by mouth daily with breakfast. Dorothyann Peng, NP Taking Active   SYMBICORT 160-4.5 MCG/ACT inhaler [id number] Yes TAKE 2 PUFFS BY MOUTH TWICE A DAY Byrum,  Rose Fillers, MD Taking Active   tadalafil, PAH, (ADCIRCA) 20 MG tablet [id number] Yes Take 20 mg by mouth  daily as needed. [provider] Taking Active   Tiotropium Bromide Monohydrate (SPIRIVA RESPIMAT) 2.5 MCG/ACT AERS [id number] Yes Inhale 2 puffs into the lungs daily. Collene Gobble, MD Taking Active   topiramate (TOPAMAX) 25 MG tablet [id number] No Take 1 tablet (25 mg total) by mouth daily.  Patient not taking: Reported on 04/10/2022   [name], MD Not Taking Active   valsartan (DIOVAN) 160 MG tablet [id number] Yes Take 1 tablet (160 mg total) by mouth daily. Collene Gobble, MD Taking Active             SDOH:  (Social Determinants of Health) assessments and interventions performed: Yes SDOH Interventions    Flowsheet Row Clinical Support from 12/27/2021 in [facility] [place] from 11/07/2019 in [facility] [place] from 05/01/2018 in [facility] [place] Interventions Intervention Not Indicated Intervention Not Indicated --  Housing Interventions Intervention Not Indicated Intervention Not Indicated --  Transportation Interventions Intervention Not Indicated Intervention Not Indicated --  Utilities Interventions Intervention Not Indicated -- --  Alcohol Usage Interventions Intervention Not Indicated (Score <7) -- --  Depression Interventions/Treatment  -- PHQ2-9 Score <4 Follow-up Not Indicated PHQ2-9 Score <4 Follow-up Not Indicated  Financial Strain Interventions Intervention Not Indicated Intervention Not Indicated --  Physical Activity Interventions Intervention Not Indicated Intervention Not Indicated --  Stress Interventions Intervention Not Indicated Intervention Not Indicated --  Social Connections Interventions Intervention Not Indicated Intervention Not Indicated --       Medication Assistance:  {MEDASSISTANCEINFO:25044}  Medication Access: Within the past 30 days, how often has patient missed a dose of medication? *** Is a pillbox or other method used to improve adherence? {YES/NO:21197} Factors that may affect medication adherence? {CHL DESC; BARRIERS:21522} Are meds synced by current pharmacy? {YES/NO:21197} Are meds delivered by current pharmacy? {YES/NO:21197} Does patient experience delays in picking up medications due to transportation concerns? {YES/NO:21197}  [facility] Reviewed: Is patient disadvantaged to use [facility]?: {YES/NO:21197} Current Rx insurance plan: [facility] Name and [facility] of [id number] [place] [address] [id number] [id number]: [phone]: [phone] [place] [address]: [phone]: [phone] [facility] services reviewed with patient today?: {YES/NO:21197} Patient requests to transfer care to [facility]?: {YES/NO:21197} Reason patient declined to change pharmacies: {US patient preference:27474}  Compliance/Adherence/Medication fill history: Care Gaps: Vaccines: COVID, Tdap, Shingrix  Star-Rating Drugs: Valsartan '160mg'$  PDC 100%   Assessment/Plan   COPD (Goal: control symptoms and prevent exacerbations) -{US controlled/uncontrolled:25276} -Current treatment  Spiriva Respimat 2.5104m 2 puffs once daily Albuterol HFA 2 puffs q6h prn -Medications previously tried: Symbicort, Duoneb -Gold Grade: Gold 3 (FEV1 30-49%) -Current COPD Classification:  {CHL [facility] COPD Class (Updated):28375} -MMRC/CAT score: *** -Pulmonary function testing: 11/24/19 FEV1 44% -Exacerbations requiring treatment in last 6 months: YES -Patient {Actions; denies-reports:120008} consistent use of maintenance inhaler -Frequency of rescue  inhaler use: *** -Counseled on {CCMINHALERCOUNSELING:25121} -{CCMPHARMDINTERVENTION:25122}  Tobacco use (Goal ***) -{US controlled/uncontrolled:25276} -Previous quit attempts: *** -Current treatment  *** -Patient smokes {Time to first cigarette:23873} -Patient triggers include: {Smoking Triggers:23882} -On a scale of 1-10, reports MOTIVATION to quit is *** -On a scale of 1-10, reports CONFIDENCE in quitting is *** -{Smoking Cessation Counseling:23883} -{CCMPHARMDINTERVENTION:25122}  Aiden Rao H Mekaela Azizi  Clinical Pharmacist [phone]

## 2022-05-09 ENCOUNTER — Telehealth: Payer: Self-pay

## 2022-05-09 ENCOUNTER — Telehealth: Payer: Medicare Other

## 2022-05-09 NOTE — Progress Notes (Signed)
Care Management & Coordination Services Pharmacy Team  Reason for Encounter: Appointment Reminder  Contacted patient to confirm telephone appointment with Burman Riis, PharmD on 05/10/2022 at 11:45. Patient rescheduled appointment to 05/16/2022 at 11:00 Spoke with patient on 05/09/2022    Care Gaps: AWV - scheduled 01/01/2023 Last BP - 102/64 on 04/10/2022 Covid booster - never done Tdap - never done Shingrix - never done   Star Rating Drugs: Valsartan 160 mg - last filled 03/20/2022 30 [facility] at [facility] Pharmacist Assistant [phone]

## 2022-05-10 ENCOUNTER — Telehealth: Payer: Medicare Other

## 2022-05-11 NOTE — Progress Notes (Signed)
Care Management & Coordination Services Pharmacy Note  05/16/2022 Name:  Masaki Padley MRN:  [id number] DOB:  03-Jun-1970  Summary: -Reports well controlled breathing with current inhaler regimen -Stopped smoking cigarettes June 2020, still smoking weed on regular basis  Recommendations/Changes made from today's visit: -Counseled on inhaler use and avoiding any triggers that make breathing more difficult -Counseled on health risk of continuing any inhalation drugs/cigarettes  Follow up plan: Pharmacist visit in October   Subjective: Billy [name] is an 52 y.o. year old male who is a primary patient of Dorothyann Peng, NP.  The care coordination team was consulted for assistance with disease management and care coordination needs.    Engaged with patient by telephone for follow up visit.  Recent office visits: 04/10/22 For allergic rhinitis, stop augmentin. Start amoxicillin (2/16)  04/07/22 Dorothyann Peng, NP - For respiratory infection. Start prednisone 20mg   12/27/21 Rolene Arbour, LPN, For AWV  Recent consult visits: 03/08/22 [name], MD [facility]) - For alcohol use dx, start topiramate 25mg  daily  02/14/22 Geraldo Pitter, NP (Pulm) For PNA, no med changes  02/07/22 [name], MD [facility]) - No med changes  01/24/22 Gwenyth Bouillon, MD [facility]) - Low B12, Vit D. Start B12 1038mcg daily and Vit D 50,000 weekly  01/10/22 Martyn Ehrich, NP (Pulm) For SOB, Start Augmentin  01/10/22 [name], MD [facility]) - No med changes  12/27/21 Evette Georges, MD (Pulm) - Phone call, restart Spiriva 2.80mcg  12/20/21 [name], MD [facility]) - Stop Bupropion and Tramadol  Hospital visits: None in previous 6 months   Objective:  Lab Results  Component Value Date   CREATININE 1.02 01/10/2022   BUN 15 01/10/2022   GFR 81.94 08/05/2019   EGFR 88 01/10/2022   GFRNONAA >60  03/26/2018   GFRAA >60 03/26/2018   NA 137 01/10/2022   K 5.1 01/10/2022   CALCIUM 9.7 01/10/2022   CO2 27 01/10/2022   GLUCOSE 93 01/10/2022    Lab Results  Component Value Date/Time   HGBA1C 5.6 01/10/2022 09:52 AM   HGBA1C 5.6 08/05/2019 10:15 AM   GFR 81.94 08/05/2019 10:15 AM   GFR 79.30 01/01/2019 04:08 PM    Last diabetic Eye exam: No results found for: "HMDIABEYEEXA"  Last diabetic Foot exam: No results found for: "HMDIABFOOTEX"   Lab Results  Component Value Date   CHOL 185 01/10/2022   HDL 52 01/10/2022   LDLCALC 114 (H) 01/10/2022   TRIG 104 01/10/2022   CHOLHDL 4 08/05/2019       Latest Ref Rng & Units 01/10/2022    9:52 AM 08/05/2019   10:15 AM 07/12/2018    4:02 PM  Hepatic Function  Total Protein 6.0 - 8.5 g/dL 6.8  6.9  6.5   Albumin 3.8 - 4.9 g/dL 4.7  4.6    AST 0 - 40 IU/L 17  23  23    ALT 0 - 44 IU/L 23  22  27    Alk Phosphatase 44 - 121 IU/L 59  46    Total Bilirubin 0.0 - 1.2 mg/dL 0.4  0.9  0.8     Lab Results  Component Value Date/Time   TSH 0.882 01/10/2022 09:52 AM   TSH 1.96 08/05/2019 10:15 AM       Latest Ref Rng & Units 01/10/2022    9:52 AM 05/27/2020   12:20 PM 08/05/2019   10:15 AM  CBC  WBC 3.4 - 10.8  x10E3/uL 6.6  10.9  7.8   Hemoglobin 13.0 - 17.7 g/dL 16.0  15.3  16.5   Hematocrit 37.5 - 51.0 % 47.8  44.2  47.9   Platelets 150 - 450 x10E3/uL 184  175.0  155.0     Lab Results  Component Value Date/Time   VD25OH 19.6 (L) 01/10/2022 09:52 AM   VITAMINB12 228 (L) 01/10/2022 09:52 AM   VITAMINB12 140 (L) 07/31/2017 01:40 PM    Clinical ASCVD: No  The 10-year ASCVD risk score (Arnett DK, et al., 2019) is: 4.7%   Values used to calculate the score:     Age: 27 years     Sex: Male     Is Non-Hispanic African American: No     Diabetic: No     Tobacco smoker: No     Systolic Blood Pressure: [id number] mmHg     Is BP treated: Yes     HDL Cholesterol: 52 mg/dL     Total Cholesterol: 185 mg/dL        04/10/2022    2:51 PM  12/27/2021    1:26 PM 05/26/2021   10:08 AM  Depression screen PHQ 2/9  Decreased Interest 0 0 0  Down, Depressed, Hopeless 0 0 0  PHQ - 2 Score 0 0 0  Altered sleeping 0    Tired, decreased energy 0    Change in appetite 0    Feeling bad or failure about yourself  0    Trouble concentrating 0    Moving slowly or fidgety/restless 0    Suicidal thoughts 0    PHQ-9 Score 0       Social History   Tobacco Use  Smoking Status Former   Packs/day: 0.50   Years: 25.00   Additional pack years: 0.00   Total pack years: 12.50   Types: Cigarettes   Quit date: 03/21/2018   Years since quitting: 4.1  Smokeless Tobacco Never   BP Readings from Last 3 Encounters:  05/16/22 134/80  04/10/22 102/64  03/08/22 99/67   Pulse Readings from Last 3 Encounters:  05/16/22 82  04/10/22 87  03/08/22 80   Wt Readings from Last 3 Encounters:  05/16/22 (!) 315 lb 6.4 oz (143.1 kg)  04/10/22 (!) 317 lb 6.4 oz (144 kg)  04/07/22 300 lb (136.1 kg)   BMI Readings from Last 3 Encounters:  05/16/22 41.61 kg/m  04/10/22 41.88 kg/m  04/07/22 39.58 kg/m    Allergies  Allergen Reactions   Ativan [Lorazepam] Other (See Comments)    Pt given Ativan.  Pt became unresponsive.    Medications Reviewed Today     Reviewed by Maren Reamer, [place] (Pharmacist) on 05/16/22 at 1114  Med List Status: <None>   Medication Order Taking? Sig Documenting Provider Last Dose Status Informant  albuterol (VENTOLIN HFA) 108 (90 Base) MCG/ACT inhaler [id number] Yes Inhale 1-2 puffs into the lungs every 6 (six) hours as needed for wheezing or shortness of breath. Collene Gobble, MD Taking Active   aspirin 81 MG tablet [id number] Yes Take 2 tablets (161 mg total) by mouth daily. Jonetta Osgood, MD Taking Active   cyanocobalamin (VITAMIN B12) 1000 MCG tablet [id number] Yes Take 1 tablet (1,000 mcg total) by mouth daily. [name], MD Taking Active   ergocalciferol (VITAMIN D2) 1.25 MG (50000 UT) capsule  [id number]  Take 1 capsule (50,000 Units total) by mouth once a week.  Patient not taking: Reported on 05/16/2022   [name], MD  Active  predniSONE (DELTASONE) 20 MG tablet [id number]  Take 1 tablet (20 mg total) by mouth daily with breakfast. Dorothyann Peng, NP  Active   SYMBICORT 160-4.5 MCG/ACT inhaler [id number] Yes TAKE 2 PUFFS BY MOUTH TWICE A DAY Byrum, Rose Fillers, MD Taking Active   tadalafil, PAH, (ADCIRCA) 20 MG tablet [id number] No Take 20 mg by mouth daily as needed.  Patient not taking: Reported on 05/16/2022   [provider] Not Taking Active   Tiotropium Bromide Monohydrate (SPIRIVA RESPIMAT) 1.25 MCG/ACT AERS [id number] Yes Inhale 2 puffs into the lungs daily. Collene Gobble, MD Taking Active   Tiotropium Bromide Monohydrate (SPIRIVA RESPIMAT) 2.5 MCG/ACT AERS [id number]  Inhale 2 puffs into the lungs daily.  Patient not taking: Reported on 05/16/2022   Collene Gobble, MD  Active   topiramate (TOPAMAX) 25 MG tablet [id number] Yes Take 1 tablet (25 mg total) by mouth daily. [name], MD Taking Active   valsartan (DIOVAN) 160 MG tablet [id number] Yes Take 1 tablet (160 mg total) by mouth daily. Collene Gobble, MD Taking Active             SDOH:  (Social Determinants of Health) assessments and interventions performed: Yes SDOH Interventions    Flowsheet Row Care Coordination from 05/16/2022 in [place] from 12/27/2021 in [place] at [place] from 11/07/2019 in [place] at [place] from 05/01/2018 in [place] at [place] Interventions Intervention Not Indicated Intervention Not Indicated Intervention Not Indicated --  Housing Interventions Intervention Not Indicated Intervention Not Indicated Intervention Not Indicated --  Transportation Interventions -- Intervention Not Indicated  Intervention Not Indicated --  Utilities Interventions -- Intervention Not Indicated -- --  Alcohol Usage Interventions -- Intervention Not Indicated (Score <7) -- --  Depression Interventions/Treatment  -- -- PHQ2-9 Score <4 Follow-up Not Indicated PHQ2-9 Score <4 Follow-up Not Indicated  Financial Strain Interventions -- Intervention Not Indicated Intervention Not Indicated --  Physical Activity Interventions -- Intervention Not Indicated Intervention Not Indicated --  Stress Interventions -- Intervention Not Indicated Intervention Not Indicated --  Social Connections Interventions -- Intervention Not Indicated Intervention Not Indicated --       Medication Assistance: None required.  Patient affirms current coverage meets needs.  Medication Access: Within the past 30 days, how often has patient missed a dose of medication? None Is a pillbox or other method used to improve adherence? No  Factors that may affect medication adherence? no barriers identified Are meds synced by current pharmacy? No  Are meds delivered by current pharmacy? No  Does patient experience delays in picking up medications due to transportation concerns? No   [facility] Services Reviewed: Is patient disadvantaged to use [facility]?: No  Current Rx insurance plan: [facility] Name and location of Current pharmacy:  [id number] [place], [place] [id number] [place] [id number] Phone: [phone] Fax: [phone]  [facility] [address] [place] [address] Phone: [phone] Fax: [phone]  [facility] services reviewed with patient today?: No  Patient requests to transfer care to [facility] Pharmacy?: No  Reason patient declined to change pharmacies: Too few medications to benefit from sync/delivery  Compliance/Adherence/Medication fill history: Care Gaps: Vaccines: COVID, Tdap,  Shingrix  Star-Rating Drugs: Valsartan 160mg  PDC 100%   Assessment/Plan   COPD (  Goal: control symptoms and prevent exacerbations) -Controlled -Current treatment  Spiriva Respimat 1.25 mcg 2 puffs once daily Appropriate, Effective, Safe, Accessible Symbicort 160-4.5 2 puffs twice daily Appropriate, Effective, Safe, Accessible Albuterol HFA 2 puffs q6h prn -Medications previously tried: Symbicort, Duoneb -Gold Grade: Gold 3 (FEV1 30-49%) -Current COPD Classification:  A (low sx, 0-1 moderate exacerbations, no hospitalizations) -Pulmonary function testing: 11/24/19 FEV1 44% -Exacerbations requiring treatment in last 6 months: YES -Patient reports consistent use of maintenance inhaler -Frequency of rescue inhaler use:  2 OR 3 inhalers, typically -Counseled on Proper inhaler technique; Benefits of consistent maintenance inhaler use When to use rescue inhaler -Recommended to continue current medication  Tobacco/Marijuana use (Goal Smoking Cessation) -Not ideally controlled -Previous quit attempts: stopped smoking cigarettes 08/19/2020 -Still smokes weed on a regular basis, not interested in quitting -Current treatment  None - Counseled on health risks of continued marijuana use with COPD  Maren Reamer Clinical Pharmacist [phone]

## 2022-05-12 DIAGNOSIS — G4733 Obstructive sleep apnea (adult) (pediatric): Secondary | ICD-10-CM | POA: Diagnosis not present

## 2022-05-16 ENCOUNTER — Encounter: Payer: Self-pay | Admitting: Emergency Medicine

## 2022-05-16 ENCOUNTER — Ambulatory Visit (INDEPENDENT_AMBULATORY_CARE_PROVIDER_SITE_OTHER): Payer: 59 | Admitting: Emergency Medicine

## 2022-05-16 ENCOUNTER — Telehealth: Payer: Self-pay

## 2022-05-16 ENCOUNTER — Ambulatory Visit: Payer: 59

## 2022-05-16 VITALS — BP 134/80 | HR 82 | Temp 97.9°F | Ht 73.0 in | Wt 315.4 lb

## 2022-05-16 DIAGNOSIS — G4733 Obstructive sleep apnea (adult) (pediatric): Secondary | ICD-10-CM

## 2022-05-16 DIAGNOSIS — J449 Chronic obstructive pulmonary disease, unspecified: Secondary | ICD-10-CM | POA: Diagnosis not present

## 2022-05-16 MED ORDER — SPIRIVA RESPIMAT 2.5 MCG/ACT IN AERS: 2.0000 | INHALATION_SPRAY | Freq: Every day | RESPIRATORY_TRACT | 0 refills | Status: DC

## 2022-05-16 MED ORDER — SPIRIVA RESPIMAT 1.25 MCG/ACT IN AERS: 2.0000 | INHALATION_SPRAY | Freq: Every day | RESPIRATORY_TRACT | 0 refills | Status: DC

## 2022-05-16 NOTE — Progress Notes (Signed)
Patient ID: Billy [name], male   DOB: [birth year], 52 y.o.   MRN: [id number] Care Management & Coordination Services Pharmacy Team  Reason for Encounter: Appointment Reminder  Contacted patient to confirm telephone appointment with Theo Dills, PharmD on 05/16/22 at 30. Spoke with patient on 05/16/2022       Star Rating Drugs:  Valsartan 160 mg - [facility]    Care Gaps: COVID Booster - Overdue TDAP - Overdue Zoster Vaccine - Overdue AWV - 12/27/21    Ned Clines [place] Clinical Pharmacist Assistant [phone]

## 2022-05-16 NOTE — Patient Instructions (Addendum)
Continue Symbicort 2 puffs twice a day.  Rinse and gargle after using. Get back on your Spiriva Respimat when you are able to do so.  We will see if we have samples here today. Continue your albuterol 2 puffs or 1 nebulizer treatment up to every 4 hours if needed for shortness of breath, chest tightness, wheezing. Start guaifenesin 600 mg (generic Mucinex) once daily.  Take this with a lot of water.  Hopefully this will help you clear your mucus more easily. Continue your CPAP plus oxygen every night.  Congratulations on using reliably. Follow with Dr Lamonte Sakai in 6 months or sooner if you have any problems

## 2022-05-16 NOTE — Assessment & Plan Note (Signed)
Overall doing fairly well since he was treated for acute bronchitis last month.  Mucus has gone from purulent to white but still thick and difficult to clear.  Will try adding guaifenesin, continue his maintenance regimen.  He had a difficult time getting his Spiriva but we will give him samples, plan to continue it as well as the Symbicort.  Continue Symbicort 2 puffs twice a day.  Rinse and gargle after using. Get back on your Spiriva Respimat when you are able to do so.  We will see if we have samples here today. Continue your albuterol 2 puffs or 1 nebulizer treatment up to every 4 hours if needed for shortness of breath, chest tightness, wheezing. Start guaifenesin 600 mg (generic Mucinex) once daily.  Take this with a lot of water.  Hopefully this will help you clear your mucus more easily. Follow with Dr Lamonte Sakai in 6 months or sooner if you have any problems

## 2022-05-16 NOTE — Progress Notes (Signed)
Subjective:    Patient ID: Billy [name], male    DOB: 1971-02-12, 52 y.o.   MRN: [id number] HPI 52 yo male with Severe COPD, severe OSA, pulmonary HTN, hypoxemia   ROV 06/10/2021 --follow-up visit 52 year old gentleman with history of OSA/OHS.  He also has severe obstructive disease on pulmonary function testing and history of hypoxemic respiratory failure with associated polycythemia that has somewhat improved over the last several years.  He does not use oxygen during the daytime.  He has seen Dr. Ander Slade since I last saw him, is not a candidate currently for inspire device.  He reports that his CPAP compliance has been improved - he did benefit from speaking with Dr Ander Slade. Wears it most nights and usually for > 4 hours. Still dries out his mouth. Has gained some wt now 302 lbs He is no longer on Spiriva and is taking Symbicort, reports occasional albuterol a few times a week, usually w exertion. No flares or prednisone since last time.  He is having increased throat wheeze and dry cough - started on lisinopril since last time.   ROV 05/16/2022 --Billy [name] is 59, followed for history of severe combined obstruction and restriction, OSA/OHS.  He has had significant hypoxemia with associated polycythemia that seems to have improved over the last several years.  He no longer uses oxygen during the daytime -repeat walking oximetry 02/14/2022 showed no desaturations with exertion.  Has seen Dr. Ander Slade, was not a candidate for an inspire device but has been wearing his CPAP approximately He was seen in November and December with progressive/flaring COPD symptoms.  At that time he was on Symbicort but had come off of his LAMA.  He was treated for possible pneumonia with Augmentin.  Today he reports that he has been having increased mucous production and chest rattle for about 5 weeks - was treated w pred, added zyrtec temporarily. Later w augmentin, did improve the purulence. Still has white mucous, thick.   Reliable with his CPAP + O2. He has been off spiriva for a week - his pharmacy ran out of it. Albuterol prn - not every day.                                                                                                 Objective:   Physical Exam  Vitals:   05/16/22 0935  BP: 134/80  Pulse: 82  Temp: 97.9 F (36.6 C)  TempSrc: Oral  SpO2: 94%  Weight: (!) 315 lb 6.4 oz (143.1 kg)  Height: 6\' 1"  (1.854 m)    Gen: Pleasant, obese, in no distress,  normal affect  ENT: No lesions,  mouth clear,  oropharynx clear, no postnasal drip  Neck: No JVD, no stridor  Lungs: clear B, no wheezes  Cardiovascular: RRR, heart sounds normal, no murmur or gallops, no LE edema  Musculoskeletal: No deformities, no cyanosis or clubbing  Neuro: alert, non focal  Skin: some chronic venous stasis changes     Assessment & Plan:  COPD (chronic obstructive pulmonary disease) (HCC) Overall doing fairly well since he was treated for acute bronchitis  last month.  Mucus has gone from purulent to white but still thick and difficult to clear.  Will try adding guaifenesin, continue his maintenance regimen.  He had a difficult time getting his Spiriva but we will give him samples, plan to continue it as well as the Symbicort.  Continue Symbicort 2 puffs twice a day.  Rinse and gargle after using. Get back on your Spiriva Respimat when you are able to do so.  We will see if we have samples here today. Continue your albuterol 2 puffs or 1 nebulizer treatment up to every 4 hours if needed for shortness of breath, chest tightness, wheezing. Start guaifenesin 600 mg (generic Mucinex) once daily.  Take this with a lot of water.  Hopefully this will help you clear your mucus more easily. Follow with Dr Lamonte Sakai in 6 months or sooner if you have any problems  Obstructive sleep apnea Continue your CPAP plus oxygen every night.  Congratulations on using reliably.    Baltazar Apo, MD, PhD 05/16/2022, 12:54  PM [facility] [phone] or if no answer [phone]

## 2022-05-16 NOTE — Assessment & Plan Note (Signed)
Continue your CPAP plus oxygen every night.  Congratulations on using reliably.

## 2022-05-28 DIAGNOSIS — G4733 Obstructive sleep apnea (adult) (pediatric): Secondary | ICD-10-CM | POA: Diagnosis not present

## 2022-06-27 DIAGNOSIS — G4733 Obstructive sleep apnea (adult) (pediatric): Secondary | ICD-10-CM | POA: Diagnosis not present

## 2022-07-27 ENCOUNTER — Other Ambulatory Visit: Payer: Self-pay | Admitting: Emergency Medicine

## 2022-07-28 DIAGNOSIS — G4733 Obstructive sleep apnea (adult) (pediatric): Secondary | ICD-10-CM | POA: Diagnosis not present

## 2022-08-01 ENCOUNTER — Telehealth: Payer: Self-pay

## 2022-08-01 NOTE — Progress Notes (Unsigned)
Care Management & Coordination Services Pharmacy Team  Reason for Encounter: COPD  Contacted patient to discuss COPD disease state. {US HC Outreach:28874}  Current COPD regimen: ***  Any recent hospitalizations or ED visits since last visit with CPP? {yes/no:20286}  Reports***denies COPD symptoms, including {COPD Symptoms:24140}  What recent interventions/DTPs have been made by any provider to improve breathing since last visit:  Have you had exacerbation/flare-up since last visit? {yes/no:20286}  What do you do when you are short of breath?  {SOBresposne:24139}***  Respiratory Devices/Equipment Do you have a nebulizer? {yes/no:20286} Do you use a Peak Flow Meter? {yes/no:20286} Do you use a maintenance inhaler? {yes/no:20286} How often do you forget to use your daily inhaler? *** Do you use a rescue inhaler? {yes/no:20286} How often do you use your rescue inhaler?  {CHL [facility] Inhaler ZOXW:[id number]} Do you use a spacer with your inhaler? {yes/no:20286}  Adherence Review: Does the patient have >5 day gap between last estimated fill date for maintenance inhaler medications? {yes/no:20286}  Care Gaps: AWV - completed 12/27/2021 Last BP - 134/80 on 05/16/2022 Covid booster - never done Tdap - never done Shingrix - never done   Star Rating Drugs: Valsartan 160 mg - [facility]   Chart Review:  Recent office visits:  None  Recent consult visits:  None  Hospital visits:  None  Medications: Outpatient Encounter Medications as of 08/01/2022  Medication Sig   albuterol (VENTOLIN HFA) 108 (90 Base) MCG/ACT inhaler Inhale 1-2 puffs into the lungs every 6 (six) hours as needed for wheezing or shortness of breath.   aspirin 81 MG tablet Take 2 tablets (161 mg total) by mouth daily.   cyanocobalamin (VITAMIN B12) 1000 MCG tablet Take 1 tablet (1,000 mcg total) by mouth daily.   ergocalciferol (VITAMIN D2) 1.25 MG (50000 UT) capsule Take  1 capsule (50,000 Units total) by mouth once a week. (Patient not taking: Reported on 05/16/2022)   predniSONE (DELTASONE) 20 MG tablet Take 1 tablet (20 mg total) by mouth daily with breakfast.   SYMBICORT 160-4.5 MCG/ACT inhaler TAKE 2 PUFFS BY MOUTH TWICE A DAY   tadalafil, PAH, (ADCIRCA) 20 MG tablet Take 20 mg by mouth daily as needed. (Patient not taking: Reported on 05/16/2022)   Tiotropium Bromide Monohydrate (SPIRIVA RESPIMAT) 1.25 MCG/ACT AERS Inhale 2 puffs into the lungs daily.   Tiotropium Bromide Monohydrate (SPIRIVA RESPIMAT) 2.5 MCG/ACT AERS Inhale 2 puffs into the lungs daily. (Patient not taking: Reported on 05/16/2022)   topiramate (TOPAMAX) 25 MG tablet Take 1 tablet (25 mg total) by mouth daily.   valsartan (DIOVAN) 160 MG tablet Take 1 tablet (160 mg total) by mouth daily.   No facility-administered encounter medications on file as of 08/01/2022.  Fill History:  Dispensed Days Supply Quantity Provider Pharmacy  ALBUTEROL HFA 90 MCG INHALER 06/29/2022 28 8.5 g      Dispensed Days Supply Quantity Provider Pharmacy  SYMBICORT  160-4.5 MCG/ACT AERO 07/31/2022 30 10.2 g      Dispensed Days Supply Quantity Provider Pharmacy  SPIRIVA RESPIMAT 2.5 MCG INH 07/10/2022 30 4 g      Dispensed Days Supply Quantity Provider Pharmacy  VALSARTAN 160 MG TABLET 07/24/2022 30 30 each      Inetta Fermo CMA  Clinical Pharmacist Assistant [phone]

## 2022-08-11 DIAGNOSIS — G4733 Obstructive sleep apnea (adult) (pediatric): Secondary | ICD-10-CM | POA: Diagnosis not present

## 2022-08-21 DIAGNOSIS — G4733 Obstructive sleep apnea (adult) (pediatric): Secondary | ICD-10-CM | POA: Diagnosis not present

## 2022-08-27 DIAGNOSIS — G4733 Obstructive sleep apnea (adult) (pediatric): Secondary | ICD-10-CM | POA: Diagnosis not present

## 2022-09-18 ENCOUNTER — Telehealth: Payer: Self-pay | Admitting: Emergency Medicine

## 2022-09-18 DIAGNOSIS — J9691 Respiratory failure, unspecified with hypoxia: Secondary | ICD-10-CM

## 2022-09-20 NOTE — Telephone Encounter (Signed)
Patient uses 2L O2 at bedtime.  I spoke with the patient. He was wanting a POC to have on hand when he flies because sometimes he feels like he can not breath good. I told him we would have to walk him in the office and document that his O2 drops in order to qualify him for a POC. He said he has been walked in the office before and he has not qualified. I told her we can place an order for Travel O2 for him to take to use at night. He said he would like [place] to send the order to [facility].  I have placed the order.  Nothing further needed.

## 2022-09-27 DIAGNOSIS — G4733 Obstructive sleep apnea (adult) (pediatric): Secondary | ICD-10-CM | POA: Diagnosis not present

## 2022-10-19 ENCOUNTER — Other Ambulatory Visit: Payer: Self-pay | Admitting: Emergency Medicine

## 2022-10-28 DIAGNOSIS — G4733 Obstructive sleep apnea (adult) (pediatric): Secondary | ICD-10-CM | POA: Diagnosis not present

## 2022-11-21 ENCOUNTER — Ambulatory Visit: Payer: 59 | Admitting: Emergency Medicine

## 2022-11-21 ENCOUNTER — Encounter: Payer: Self-pay | Admitting: Emergency Medicine

## 2022-11-21 VITALS — BP 102/64 | HR 90 | Ht 73.0 in | Wt 325.2 lb

## 2022-11-21 DIAGNOSIS — G4733 Obstructive sleep apnea (adult) (pediatric): Secondary | ICD-10-CM | POA: Diagnosis not present

## 2022-11-21 DIAGNOSIS — J449 Chronic obstructive pulmonary disease, unspecified: Secondary | ICD-10-CM

## 2022-11-21 NOTE — Assessment & Plan Note (Signed)
He has great compliance and clinical benefit.  He cannot sleep without the device.  His machine is over 53 years old but is working appropriately.  We will repeat a home sleep study to document his OSA in preparation for ordering a new device from [place].

## 2022-11-21 NOTE — Progress Notes (Signed)
Subjective:    Patient ID: Billy [name], male    DOB: 31-Oct-1970, 52 y.o.   MRN: [id number] HPI 52 yo male with Severe COPD, severe OSA, pulmonary HTN, hypoxemia   ROV 06/10/2021 --follow-up visit 52 year old gentleman with history of OSA/OHS.  He also has severe obstructive disease on pulmonary function testing and history of hypoxemic respiratory failure with associated polycythemia that has somewhat improved over the last several years.  He does not use oxygen during the daytime.  He has seen Dr. Wynona Neat since I last saw him, is not a candidate currently for inspire device.  He reports that his CPAP compliance has been improved - he did benefit from speaking with Dr Wynona Neat. Wears it most nights and usually for > 4 hours. Still dries out his mouth. Has gained some wt now 302 lbs He is no longer on Spiriva and is taking Symbicort, reports occasional albuterol a few times a week, usually w exertion. No flares or prednisone since last time.  He is having increased throat wheeze and dry cough - started on lisinopril since last time.   ROV 05/16/2022 --Billy [name] is 60, followed for history of severe combined obstruction and restriction, OSA/OHS.  He has had significant hypoxemia with associated polycythemia that seems to have improved over the last several years.  He no longer uses oxygen during the daytime -repeat walking oximetry 02/14/2022 showed no desaturations with exertion.  Has seen Dr. Wynona Neat, was not a candidate for an inspire device but has been wearing his CPAP approximately He was seen in November and December with progressive/flaring COPD symptoms.  At that time he was on Symbicort but had come off of his LAMA.  He was treated for possible pneumonia with Augmentin.  Today he reports that he has been having increased mucous production and chest rattle for about 5 weeks - was treated w pred, added zyrtec temporarily. Later w augmentin, did improve the purulence. Still has white mucous, thick.   Reliable with his CPAP + O2. He has been off spiriva for a week - his pharmacy ran out of it. Albuterol prn - not every day.   ROV 11/21/2022 --follow-up visit for [facility].  He is 52.  I have followed him for restrictive lung disease, superimposed obstructive lung disease, hypoxemia associated with OSA/OHS.  His hypoxemia actually improved significantly with better control of his underlying conditions.  He is reliable with this CPAP, using every night with good clinical benefit. About 52 yrs old, it is his 2nd machine.  He has been managed on Symbicort, restarted Spiriva back in March 2024.  He reports today that he has gained some wt since last time, had been as low as 285 lbs. He impacted his exercise tolerance, overall breathing - some exertional SOB w stairs. Increased leg pain. Less exercise. On Spiriva and Symbicort.                                                                                                 Objective:   Physical Exam  Vitals:   11/21/22 0850  BP: 102/64  Pulse: 90  SpO2:  90%  Weight: (!) 325 lb 3.2 oz (147.5 kg)  Height: 6\' 1"  (1.854 m)    Gen: Pleasant, obese, in no distress,  normal affect  ENT: No lesions,  mouth clear,  oropharynx clear, no postnasal drip  Neck: No JVD, no stridor  Lungs: clear B, no wheezes  Cardiovascular: RRR, heart sounds normal, no murmur or gallops, no LE edema  Musculoskeletal: No deformities, no cyanosis or clubbing  Neuro: alert, non focal  Skin: some chronic venous stasis changes     Assessment & Plan:  COPD (chronic obstructive pulmonary disease) (HCC) Overall doing well without any flares.  He does have some decreased exertional tolerance that likely relates to some weight gain.  Cough and wheeze are under control.  Occasional albuterol use but not as much as in the past.  Plan to continue Spiriva and Symbicort as a maintenance regimen.   Obstructive sleep apnea He has great compliance and clinical benefit.  He cannot  sleep without the device.  His machine is over 50 years old but is working appropriately.  We will repeat a home sleep study to document his OSA in preparation for ordering a new device from [place].     Levy Pupa, MD, PhD 11/21/2022, 10:37 AM [facility] [phone] or if no answer [phone]

## 2022-11-21 NOTE — Patient Instructions (Signed)
Continue your Spiriva and Symbicort as you have been taking them. Continue your CPAP every night.  We confirmed good compliance and good clinical benefit today We will perform a repeat home sleep study on room air to redocument your obstructive sleep apnea so we can order a new machine for you through [facility] in the future Follow with Dr Delton Coombes in 6 months or sooner if you have any problems

## 2022-11-21 NOTE — Assessment & Plan Note (Signed)
Overall doing well without any flares.  He does have some decreased exertional tolerance that likely relates to some weight gain.  Cough and wheeze are under control.  Occasional albuterol use but not as much as in the past.  Plan to continue Spiriva and Symbicort as a maintenance regimen.

## 2022-11-27 DIAGNOSIS — G4733 Obstructive sleep apnea (adult) (pediatric): Secondary | ICD-10-CM | POA: Diagnosis not present

## 2022-12-05 DIAGNOSIS — G4733 Obstructive sleep apnea (adult) (pediatric): Secondary | ICD-10-CM | POA: Diagnosis not present

## 2022-12-14 ENCOUNTER — Other Ambulatory Visit: Payer: Self-pay | Admitting: Emergency Medicine

## 2022-12-17 ENCOUNTER — Emergency Department (HOSPITAL_COMMUNITY)
Admission: EM | Admit: 2022-12-17 | Discharge: 2022-12-17 | Disposition: A | Discharge status: Home / Self Care | Payer: 59 | Attending: Emergency Medicine | Admitting: Emergency Medicine

## 2022-12-17 ENCOUNTER — Other Ambulatory Visit: Payer: Self-pay

## 2022-12-17 ENCOUNTER — Emergency Department (HOSPITAL_COMMUNITY): Payer: 59

## 2022-12-17 ENCOUNTER — Encounter (HOSPITAL_COMMUNITY): Payer: Self-pay | Admitting: *Deleted

## 2022-12-17 DIAGNOSIS — Z7951 Long term (current) use of inhaled steroids: Secondary | ICD-10-CM | POA: Insufficient documentation

## 2022-12-17 DIAGNOSIS — J449 Chronic obstructive pulmonary disease, unspecified: Secondary | ICD-10-CM | POA: Insufficient documentation

## 2022-12-17 DIAGNOSIS — Z7982 Long term (current) use of aspirin: Secondary | ICD-10-CM | POA: Diagnosis not present

## 2022-12-17 DIAGNOSIS — I10 Essential (primary) hypertension: Secondary | ICD-10-CM | POA: Insufficient documentation

## 2022-12-17 DIAGNOSIS — Z79899 Other long term (current) drug therapy: Secondary | ICD-10-CM | POA: Diagnosis not present

## 2022-12-17 DIAGNOSIS — J984 Other disorders of lung: Secondary | ICD-10-CM | POA: Diagnosis not present

## 2022-12-17 DIAGNOSIS — R0602 Shortness of breath: Secondary | ICD-10-CM | POA: Diagnosis not present

## 2022-12-17 DIAGNOSIS — R0789 Other chest pain: Secondary | ICD-10-CM | POA: Diagnosis not present

## 2022-12-17 DIAGNOSIS — R079 Chest pain, unspecified: Secondary | ICD-10-CM | POA: Diagnosis not present

## 2022-12-17 DIAGNOSIS — I959 Hypotension, unspecified: Secondary | ICD-10-CM | POA: Diagnosis not present

## 2022-12-17 LAB — BASIC METABOLIC PANEL
Anion gap: 10 (ref 5–15)
BUN: 21 mg/dL — ABNORMAL HIGH (ref 6–20)
CO2: 23 mmol/L (ref 22–32)
Calcium: 8.7 mg/dL — ABNORMAL LOW (ref 8.9–10.3)
Chloride: 101 mmol/L (ref 98–111)
Creatinine, Ser: 1.08 mg/dL (ref 0.61–1.24)
GFR, Estimated: >60 mL/min (ref >60)
Glucose, Bld: 139 mg/dL — ABNORMAL HIGH (ref 70–99)
Potassium: 4 mmol/L (ref 3.5–5.1)
Sodium: 134 mmol/L — ABNORMAL LOW (ref 135–145)

## 2022-12-17 LAB — CBC
HCT: 43.9 % (ref 39.0–52.0)
Hemoglobin: 14.7 g/dL (ref 13.0–17.0)
MCH: 34.1 pg — ABNORMAL HIGH (ref 26.0–34.0)
MCHC: 33.5 g/dL (ref 30.0–36.0)
MCV: 101.9 fL — ABNORMAL HIGH (ref 80.0–100.0)
Platelets: 164 10*3/uL (ref 150–400)
RBC: 4.31 MIL/uL (ref 4.22–5.81)
RDW: 12.2 % (ref 11.5–15.5)
WBC: 9.9 10*3/uL (ref 4.0–10.5)
nRBC: 0 % (ref 0.0–0.2)

## 2022-12-17 LAB — TROPONIN I (HIGH SENSITIVITY): Troponin I (High Sensitivity): 6 ng/L (ref <18)

## 2022-12-17 NOTE — ED Triage Notes (Signed)
Pt arrived from home with ems for sob and centralized chest pain. Pt was playing golf yesterday afternoon and after taking a swing felt pain to left chest. Pt consistent throughout the day. This morning work up around 1am with chills and feeling worsening sob, currently wears cpap at night. EMS gave 324mg  ASA and nitroglycerin x1. BP dropped from 152/80 to 84/50

## 2022-12-17 NOTE — ED Provider Notes (Signed)
[place] EMERGENCY DEPARTMENT AT [facility] Provider Note  CSN: [id number] Arrival date & time: 12/17/22 0209  Chief Complaint(s) Chest Pain  HPI Billy [name] is a 52 y.o. male with a past medical history listed below here for sternal chest pain.  Pain began earlier today around 1 PM.  Patient states that he was playing golf and tried to take a hard swing.  He felt the immediate pain during that time.  Pain is a deep ache/soreness.  Worse with range of motion and palpation of the chest.  No associated shortness of breath at that time.  Patient went about his day with persistent pain.  This evening, he awoke to go to the bathroom at home at which time he felt the pain get worse.  He believes that it was the coldness of the room that causes muscles tighten up.  However given the intensity of the pain, it began feeling short of breath and believes he had a panic attack causing him to shake.  This persisted prompting his visit to the emergency department.  No recent fevers or infections.  No coughing or congestion.  No nausea or vomiting.  No abdominal pain.  No other physical complaints.  The history is provided by the patient.    Past Medical History Past Medical History:  Diagnosis Date   Alcohol use    Allergy    as a child- none as adult    COPD (chronic obstructive pulmonary disease) (HCC)    Drug use    Edema of both lower extremities    History of bronchitis    Hypertension    Obesity    Oxygen deficiency    Pt uses 02  3 liters with his CPAP ONLY    PFO (patent foramen ovale)    Polycythemia    a. Dr. Myna Hidalgo.Marland KitchenMarland KitchenJAK-2 analysis is negative.  b. likely physiologic secondary to chronic hypoxia (? OSA + COPD + obesity).  c. s/p plebotomy x1   PVD (peripheral vascular disease) (HCC)    Sleep apnea    a. mod severe by sleep study 9.30.11: AHI 44.8 per hour; oxygen desat to a nadir of 65%; unsuccessful CPAP titration; significant hypoxemia even with supp o2 at  3LPM (dest to 70-80%); needs eval for cardiopulmonary disease and upper airway obstruction   SOB (shortness of breath)    Substance abuse (HCC)    alcoholism    Tobacco abuse    Patient Active Problem List   Diagnosis Date Noted   Pneumonia 01/15/2022   Acute respiratory failure with hypoxia and hypercapnia (HCC) 03/22/2018   Respiratory failure with hypoxia (HCC) 03/22/2018   Allergic rhinitis 06/25/2012   Polycythemia vera (HCC) 06/24/2012   CONJUNCTIVITIS, BACTERIAL 12/22/2009   Obstructive sleep apnea 12/14/2009   PATENT FORAMEN OVALE 11/17/2009   HYPOXEMIA 11/17/2009   Polycythemia, secondary 09/22/2009   THROMBOCYTOPENIA 09/16/2009   Alcohol abuse 09/16/2009   COPD (chronic obstructive pulmonary disease) (HCC) 09/16/2009   OBESITY 01/06/2009   TOBACCO ABUSE 01/06/2009   PERIPHERAL VASCULAR DISEASE 01/06/2009   BRONCHITIS 01/06/2009   Home Medication(s) Prior to Admission medications   Medication Sig Start Date End Date Taking? Authorizing Provider  albuterol (VENTOLIN HFA) 108 (90 Base) MCG/ACT inhaler Inhale 1-2 puffs into the lungs every 6 (six) hours as needed for wheezing or shortness of breath. 04/20/22   Byrum, Les Pou, MD  aspirin 81 MG tablet Take 2 tablets (161 mg total) by mouth daily. 03/27/18   Ghimire, Werner Lean,  MD  cyanocobalamin (VITAMIN B12) 1000 MCG tablet Take 1 tablet (1,000 mcg total) by mouth daily. 01/24/22   Worthy Rancher, MD  ergocalciferol (VITAMIN D2) 1.25 MG (50000 UT) capsule Take 1 capsule (50,000 Units total) by mouth once a week. Patient not taking: Reported on 05/16/2022 01/24/22   Worthy Rancher, MD  [facility] 160-4.5 MCG/ACT inhaler TAKE 2 PUFFS BY MOUTH TWICE A DAY 07/27/22   Leslye Peer, MD  Tiotropium Bromide Monohydrate (SPIRIVA RESPIMAT) 1.25 MCG/ACT AERS Inhale 2 puffs into the lungs daily. 05/16/22   Leslye Peer, MD  Tiotropium Bromide Monohydrate (SPIRIVA RESPIMAT) 2.5 MCG/ACT AERS INHALE 2 PUFFS BY MOUTH INTO THE LUNGS  DAILY 10/20/22   Leslye Peer, MD  topiramate (TOPAMAX) 25 MG tablet Take 1 tablet (25 mg total) by mouth daily. 03/08/22   Worthy Rancher, MD  valsartan (DIOVAN) 160 MG tablet Take 1 tablet (160 mg total) by mouth daily. 11/28/21   Leslye Peer, MD                                                                                                                                    Allergies Ativan [lorazepam]  Review of Systems Review of Systems As noted in HPI  Physical Exam Vital Signs  I have reviewed the triage vital signs BP 102/76   Pulse 86   Temp 98.2 F (36.8 C) (Oral)   Resp 15   SpO2 96%   Physical Exam Vitals reviewed.  Constitutional:      General: He is not in acute distress.    Appearance: He is well-developed. He is obese. He is not diaphoretic.  HENT:     Head: Normocephalic and atraumatic.     Nose: Nose normal.  Eyes:     General: No scleral icterus.       Right eye: No discharge.        Left eye: No discharge.     Conjunctiva/sclera: Conjunctivae normal.     Pupils: Pupils are equal, round, and reactive to light.  Cardiovascular:     Rate and Rhythm: Normal rate and regular rhythm.     Heart sounds: No murmur heard.    No friction rub. No gallop.  Pulmonary:     Effort: Pulmonary effort is normal. No respiratory distress.     Breath sounds: Normal breath sounds. No stridor. No rales.  Chest:     Chest wall: Tenderness present.       Comments: Pain with ROM utilizing pectoral muscles Abdominal:     General: There is no distension.     Palpations: Abdomen is soft.     Tenderness: There is no abdominal tenderness.  Musculoskeletal:        General: No tenderness.     Cervical back: Normal range of motion and neck supple.  Skin:    General: Skin is warm and dry.     Findings: No  erythema or rash.  Neurological:     Mental Status: He is alert and oriented to person, place, and time.     ED Results and Treatments Labs (all labs ordered  are listed, but only abnormal results are displayed) Labs Reviewed  BASIC METABOLIC PANEL - Abnormal; Notable for the following components:      Result Value   Sodium 134 (*)    Glucose, Bld 139 (*)    BUN 21 (*)    Calcium 8.7 (*)    All other components within normal limits  CBC - Abnormal; Notable for the following components:   MCV 101.9 (*)    MCH 34.1 (*)    All other components within normal limits  TROPONIN I (HIGH SENSITIVITY)                                                                                                                         EKG  EKG Interpretation Date/Time:  Sunday December 17 2022 02:17:55 EDT Ventricular Rate:  105 PR Interval:  176 QRS Duration:  116 QT Interval:  362 QTC Calculation: 478 R Axis:   -83  Text Interpretation: Sinus tachycardia Left axis deviation Pulmonary disease pattern Incomplete right bundle branch block Septal infarct , age undetermined Abnormal ECG When compared with ECG of 22-Mar-2018 11:40, PREVIOUS ECG IS PRESENT No significant change since last tracing Confirmed by Drema Pry [id number]) on 12/17/2022 2:20:28 AM       Radiology No results found.  Medications Ordered in ED Medications - No data to display Procedures Procedures  (including critical care time) Medical Decision Making / ED Course   Medical Decision Making Amount and/or Complexity of Data Reviewed Labs: ordered. Decision-making details documented in ED Course. Radiology: ordered and independent interpretation performed. Decision-making details documented in ED Course. ECG/medicine tests: ordered and independent interpretation performed. Decision-making details documented in ED Course.    Chest pain workup for differential diagnoses listed below  Presentation is most suspicious for chest wall pain.  Highly atypical for ACS.  EKG without acute ischemic changes or evidence of pericarditis.  Troponin negative at 13-hour [place].  No need for additional  troponins. CBC without leukocytosis or anemia Metabolic panel without significant electrolyte derangements or renal sufficiency Chest x-ray without evidence of pneumonia, pneumothorax, pulmonary edema pleural effusions.  Presentation not classic for dissection or esophageal perforation. Doubt pulmonary embolism.    Final Clinical Impression(s) / ED Diagnoses Final diagnoses:  Chest wall pain   The patient appears reasonably screened and/or stabilized for discharge and I doubt any other medical condition or other [facility] requiring further screening, evaluation, or treatment in the ED at this time. I have discussed the findings, Dx and Tx plan with the patient/family who expressed understanding and agree(s) with the plan. Discharge instructions discussed at length. The patient/family was given strict return precautions who verbalized understanding of the instructions. No further questions at time of discharge.  Disposition: Discharge  Condition: Good  ED Discharge Orders  None        Follow Up: Shirline Frees, NP [address] [place] [address] [phone]  Call  as needed    This chart was dictated using voice recognition software.  Despite best efforts to proofread,  errors can occur which can change the documentation meaning.    Nira Conn, MD 12/17/22 [phone]

## 2022-12-17 NOTE — ED Notes (Signed)
Pts girlfriend called to get an update when able, Marylene Land [phone]

## 2022-12-17 NOTE — Discharge Instructions (Addendum)
You may use over-the-counter Motrin (Ibuprofen), Acetaminophen (Tylenol), topical muscle creams such as SalonPas, [employer]ul, Bengay, etc. Please stretch, apply ice or heat (whichever helps), and have massage therapy for additional assistance.  For pain control you may take 1000 mg of acetaminophen (Tylenol) every 8 hours and/or 600 mg of Ibuprofen (Motrin, Advil, etc.) every 6-8 hours as needed.  Please limit acetaminophen (Tylenol) to 4000 mg and Ibuprofen (Motrin, Advil, etc.) to 2400 mg for a 24hr period. Please note that other over-the-counter medicine may contain acetaminophen or ibuprofen as a component of their ingredients.

## 2022-12-18 DIAGNOSIS — G4730 Sleep apnea, unspecified: Secondary | ICD-10-CM | POA: Diagnosis not present

## 2022-12-19 ENCOUNTER — Encounter (INDEPENDENT_AMBULATORY_CARE_PROVIDER_SITE_OTHER): Payer: 59

## 2022-12-19 DIAGNOSIS — G4730 Sleep apnea, unspecified: Secondary | ICD-10-CM | POA: Diagnosis not present

## 2022-12-19 DIAGNOSIS — G4733 Obstructive sleep apnea (adult) (pediatric): Secondary | ICD-10-CM

## 2022-12-28 ENCOUNTER — Other Ambulatory Visit: Payer: Self-pay | Admitting: Emergency Medicine

## 2022-12-28 DIAGNOSIS — G4733 Obstructive sleep apnea (adult) (pediatric): Secondary | ICD-10-CM | POA: Diagnosis not present

## 2023-01-09 DIAGNOSIS — G4733 Obstructive sleep apnea (adult) (pediatric): Secondary | ICD-10-CM

## 2023-01-17 ENCOUNTER — Other Ambulatory Visit: Payer: Self-pay | Admitting: Emergency Medicine

## 2023-01-17 DIAGNOSIS — G4733 Obstructive sleep apnea (adult) (pediatric): Secondary | ICD-10-CM

## 2023-01-17 NOTE — Progress Notes (Signed)
Placing orders for pt to get a new CPAP from his DME. His existing device is over 53 yrs old. He had a repeat PSG on 12/19/22.

## 2023-01-18 ENCOUNTER — Ambulatory Visit: Payer: 59 | Admitting: Family Medicine

## 2023-01-18 DIAGNOSIS — Z0000 Encounter for general adult medical examination without abnormal findings: Secondary | ICD-10-CM

## 2023-01-18 NOTE — Progress Notes (Signed)
 Patient unable to obtain vital signs due to telehealth visit

## 2023-01-18 NOTE — Patient Instructions (Addendum)
I really enjoyed getting to talk with you today! I am available on Tuesdays and Thursdays for virtual visits if you have any questions or concerns, or if I can be of any further assistance.   CHECKLIST FROM ANNUAL WELLNESS VISIT:  -Follow up (please call to schedule if not scheduled after visit):   -Please call the office to schedule you inperson physical   -yearly for annual wellness visit with primary care office  Here is a list of your preventive care/health maintenance measures and the plan for each if any are due:  PLAN For any measures below that may be due:  -can get the vaccines at the pharmacy - please let Kandee Keen know when you do and bring receipt so that he can update your chart  Health Maintenance  Topic Date Due   DTaP/Tdap/Td (1 - Tdap) Never done   Zoster Vaccines- Shingrix (1 of 2) Never done   COVID-19 Vaccine (1) 02/03/2023 (Originally 08/17/1975)   Medicare Annual Wellness (AWV)  01/18/2024   Colonoscopy  10/08/2029   Hepatitis C Screening  Completed   HIV Screening  Completed   HPV VACCINES  Aged Out    -See a dentist at least yearly  -Get your eyes checked and then per your eye specialist's recommendations  -Other issues addressed today:   - please do not consume more than 2 alcoholic drinks in any given day  -I have included below further information regarding a healthy whole foods based diet, physical activity guidelines for adults, stress management and opportunities for social connections. I hope you find this information useful.   -----------------------------------------------------------------------------------------------------------------------------------------------------------------------------------------------------------------------------------------------------------  NUTRITION: -eat real food: lots of colorful vegetables (half the plate) and fruits -5-7 servings of vegetables and fruits per day (fresh or steamed is best), exp. 2 servings of  vegetables with lunch and dinner and 2 servings of fruit per day. Berries and greens such as kale and collards are great choices.  -consume on a regular basis: whole grains (make sure first ingredient on label contains the word "whole"), fresh fruits, fish, nuts, seeds, healthy oils (such as olive oil, avocado oil, grape seed oil) -may eat small amounts of dairy and lean meat on occasion, but avoid processed meats such as ham, bacon, lunch meat, etc. -drink water -try to avoid fast food and pre-packaged foods, processed meat -most experts advise limiting sodium to < 2300mg  per day, should limit further is any chronic conditions such as high blood pressure, heart disease, diabetes, etc. The American Heart Association advised that < 1500mg  is is ideal -try to avoid foods that contain any ingredients with names you do not recognize  -try to avoid sugar/sweets (except for the natural sugar that occurs in fresh fruit) -try to avoid sweet drinks -try to avoid white rice, white bread, pasta (unless whole grain), white or yellow potatoes  EXERCISE GUIDELINES FOR ADULTS: -if you wish to increase your physical activity, do so gradually and with the approval of your doctor -STOP and seek medical care immediately if you have any chest pain, chest discomfort or trouble breathing when starting or increasing exercise  -move and stretch your body, legs, feet and arms when sitting for long periods -Physical activity guidelines for optimal health in adults: -least 150 minutes per week of aerobic exercise (can talk, but not sing) once approved by your doctor, 20-30 minutes of sustained activity or two 10 minute episodes of sustained activity every day.  -resistance training at least 2 days per week if approved by your doctor -  balance exercises 3+ days per week:   Stand somewhere where you have something sturdy to hold onto if you lose balance.    1) lift up on toes, start with 5x per day and work up to 20x   2)  stand and lift on leg straight out to the side so that foot is a few inches of the floor, start with 5x each side and work up to 20x each side   3) stand on one foot, start with 5 seconds each side and work up to 20 seconds on each side  If you need ideas or help with getting more active:   -Walk with a Doc: http://www.duncan-williams.com/  -Check out the [place] Active Adults 50+ section on the [place] [employer] (hiking clubs, book clubs, cards and games, chess, exercise classes, aquatic classes and much more) - see the website for details: https://www.[place].gov/departments/parks-recreation/active-adults50  -YouTube has lots of exercise videos for different ages and abilities as well  -try to include resistance (weight lifting/strength building) and balance exercises twice per week: or the following link for ideas: http://castillo-powell.com/  BuyDucts.dk  STRESS MANAGEMENT: -can try meditating, or just sitting quietly with deep breathing while intentionally relaxing all parts of your body for 5 minutes daily -if you need further help with stress, anxiety or depression please follow up with your primary doctor or contact the wonderful folks at [facility]: [phone]   ADVANCED HEALTHCARE DIRECTIVES:  [place] Advanced Directives assistance:   ExpressWeek.com.cy  Everyone should have advanced health care directives in place. This is so that you get the care you want, should you ever be in a situation where you are unable to make your own medical decisions.   From the  Advanced Directive Website: "Advance Health Care Directives are legal documents in which you give written instructions about your health care if, in the future, you cannot speak for yourself.   A health care power of attorney allows you to name a  person you trust to make your health care decisions if you cannot make them yourself. A declaration of a desire for a natural death (or living will) is document, which states that you desire not to have your life prolonged by extraordinary measures if you have a terminal or incurable illness or if you are in a vegetative state. An advance instruction for mental health treatment makes a declaration of instructions, information and preferences regarding your mental health treatment. It also states that you are aware that the advance instruction authorizes a mental health treatment provider to act according to your wishes. It may also outline your consent or refusal of mental health treatment. A declaration of an anatomical gift allows anyone over the age of 58 to make a gift by will, organ donor card or other document."   Please see the following website or an elder law attorney for forms, FAQs and for completion of advanced directives: [place] Health Care Directives Advance Health Care Directives (http://guzman.com/)  Or copy and paste the following to your web browser: PoshChat.fi

## 2023-01-18 NOTE — Progress Notes (Signed)
PATIENT CHECK-IN and HEALTH RISK ASSESSMENT QUESTIONNAIRE:  -completed by phone/video for upcoming Medicare Preventive Visit  Pre-Visit Check-in: 1)Vitals (height, wt, BP, etc) - record in vitals section for visit on day of visit Request home vitals (wt, BP, etc.) and enter into vitals, THEN update Vital Signs SmartPhrase below at the top of the HPI. See below.  2)Review and Update Medications, Allergies PMH, Surgeries, Social history in Epic 3)Hospitalizations in the last year with date/reason? Yes Panic attack   4)Review and Update Care Team (patient's specialists) in Epic 5) Complete PHQ9 in Epic  6) Complete Fall Screening in Epic 7)Review all Health Maintenance Due and order under PCP if not done.  Medicare Wellness Patient Questionnaire:  Answer theses question about your habits: Do you drink alcohol? Yes  If yes, how many drinks do you have a day?8 beers  Have you ever smoked?Yes Quit date if applicable? 5 years ago  How many packs a day do/did you smoke? 11/2-2  Do you use smokeless tobacco?No Do you use an illicit drugs?Yes  Do you exercises? Yes IF so, what type and how many days/minutes per week?3 days a week for 30 minutes- Walking  Are you sexually active? Yes Number of partners? 1 Typical breakfast: Varies  Typical lunch: Sandwich  Typical dinner: Varies  Typical snacks: Pork rinds  Beverages: Beer   Answer theses question about you: Can you perform most household chores? Yes Do you find it hard to follow a conversation in a noisy room? Depends  Do you often ask people to speak up or repeat themselves? No Do you feel that you have a problem with memory? No Do you balance your checkbook and or bank acounts? Yes Do you feel safe at home?Yes  Last dentist visit? 2024 Do you need assistance with any of the following: Please note if so No  Driving?  Feeding yourself?  Getting from bed to chair?  Getting to the toilet?  Bathing or showering?  Dressing  yourself?  Managing money?  Climbing a flight of stairs  Preparing meals?    Do you have Advanced Directives in place (Living Will, Healthcare Power or Attorney)? No   Last eye Exam and location? 1 year ago [place]   Do you currently use prescribed or non-prescribed narcotic or opioid pain medications? No  Do you have a history or close family history of breast, ovarian, tubal or peritoneal cancer or a family member with BRCA (breast cancer susceptibility 1 and 2) gene mutations? No  Request home vitals (wt, BP, etc.) and enter into vitals, THEN update Vital Signs SmartPhrase below at the top of the HPI. See below.   Nurse/Assistant Credentials/time stamp: MG 12:00 PM   ----------------------------------------------------------------------------------------------------------------------------------------------------------------------------------------------------------------------  Because this visit was a virtual/telehealth visit, some criteria may be missing or patient reported. Any vitals not documented were not able to be obtained and vitals that have been documented are patient reported.    MEDICARE ANNUAL PREVENTIVE CARE VISIT WITH PROVIDER (Welcome to Medicare, initial annual wellness or annual wellness exam)  Virtual Visit via Video Note  I connected with Billy [name] on 01/18/23  by a video enabled telemedicine application and verified that I am speaking with the correct person using two identifiers.  Location patient: home Location provider:work or home office Persons participating in the virtual visit: patient, provider  Concerns and/or follow up today: none, denies any concerns.    See HM section in Epic for other details of completed HM.    ROS: negative  for report of fevers, unintentional weight loss, vision changes, vision loss, hearing loss or change, chest pain, sob, hemoptysis, melena, hematochezia, hematuria, falls, bleeding or bruising, thoughts  of suicide or self harm, memory loss  Patient-completed extensive health risk assessment - reviewed and discussed with the patient: See Health Risk Assessment completed with patient prior to the visit either above or in recent phone note. This was reviewed in detailed with the patient today and appropriate recommendations, orders and referrals were placed as needed per Summary below and patient instructions.   Review of Medical History: -PMH, PSH, Family History and current specialty and care providers reviewed and updated and listed below   Patient Care Team: Shirline Frees, NP as PCP - General (Family Medicine) Leslye Peer, MD as Consulting Physician (Pulmonary Disease) Leitha Bleak, Milas Kocher, [facility] (Pharmacist)   Past Medical History:  Diagnosis Date   Alcohol use    Allergy    as a child- none as adult    COPD (chronic obstructive pulmonary disease) (HCC)    Drug use    Edema of both lower extremities    History of bronchitis    Hypertension    Obesity    Oxygen deficiency    Pt uses 02  3 liters with his CPAP ONLY    PFO (patent foramen ovale)    Polycythemia    a. Dr. Myna Hidalgo.Marland KitchenMarland KitchenJAK-2 analysis is negative.  b. likely physiologic secondary to chronic hypoxia (? OSA + COPD + obesity).  c. s/p plebotomy x1   PVD (peripheral vascular disease) (HCC)    Sleep apnea    a. mod severe by sleep study 9.30.11: AHI 44.8 per hour; oxygen desat to a nadir of 65%; unsuccessful CPAP titration; significant hypoxemia even with supp o2 at 3LPM (dest to 70-80%); needs eval for cardiopulmonary disease and upper airway obstruction   SOB (shortness of breath)    Substance abuse (HCC)    alcoholism    Tobacco abuse     Past Surgical History:  Procedure Laterality Date   knee injury  1982   TONSILLECTOMY  1972   TYMPANOSTOMY TUBE PLACEMENT     as child     Social History   Socioeconomic History   Marital status: Divorced    Spouse name: Not on file   Number of children: 2   Years of  education: Not on file   Highest education level: Not on file  Occupational History   Occupation: unemployed   Occupation: Disabilty  Tobacco Use   Smoking status: Former    Current packs/day: 0.00    Average packs/day: 0.5 packs/day for 25.0 years (12.5 ttl pk-yrs)    Types: Cigarettes    Start date: 03/21/1993    Quit date: 03/21/2018    Years since quitting: 4.8   Smokeless tobacco: Never  Vaping Use   Vaping status: Former  Substance and Sexual Activity   Alcohol use: Yes    Alcohol/week: 76.0 standard drinks of alcohol    Types: 36 Cans of beer, 40 Shots of liquor per week   Drug use: Yes    Frequency: 1.0 times per week    Types: Marijuana    Comment: marajuana...also valium not prescribed   Sexual activity: Not Currently  Other Topics Concern   Not on file  Social History Narrative   05/01/2018:   Lives with mother on one level   Has two 18yo sons who live nearby   Enjoys hunting/fishing   Since being hospitalized for COPD exacerbation,  has quit smoking, and is motivated to do aerobic exercise   Heavy alcohol consumer, does not see it as a problem, no interest in reducing alcohol intake.   Social Determinants of Health   Financial Resource Strain: Low Risk  (01/18/2023)   Overall Financial Resource Strain (CARDIA)    Difficulty of Paying Living Expenses: Not very hard  Food Insecurity: No Food Insecurity (05/16/2022)   Hunger Vital Sign    Worried About Running Out of Food in the Last Year: Never true    Ran Out of Food in the Last Year: Never true  Transportation Needs: No Transportation Needs (01/18/2023)   PRAPARE - Administrator, Civil Service (Medical): No    Lack of Transportation (Non-Medical): No  Physical Activity: Insufficiently Active (01/18/2023)   Exercise Vital Sign    Days of Exercise per Week: 3 days    Minutes of Exercise per Session: 30 min  Stress: No Stress Concern Present (01/18/2023)   [employer] of Occupational Health -  Occupational Stress Questionnaire    Feeling of Stress : Not at all  Social Connections: Moderately Isolated (01/18/2023)   Social Connection and Isolation Panel [NHANES]    Frequency of Communication with Friends and Family: More than three times a week    Frequency of Social Gatherings with Friends and Family: More than three times a week    Attends Religious Services: 1 to 4 times per year    Active Member of [employer] or Organizations: No    Attends Banker Meetings: Never    Marital Status: Divorced  Catering manager Violence: Not At Risk (01/18/2023)   Humiliation, Afraid, Rape, and Kick questionnaire    Fear of Current or Ex-Partner: No    Emotionally Abused: No    Physically Abused: No    Sexually Abused: No    Family History  Problem Relation Age of Onset   Other Mother        Wegener's Disease   Thyroid disease Mother    Alcoholism Mother    Eating disorder Mother    Obesity Mother    Drug abuse Father    Alcohol abuse Father    Asthma Father    Eating disorder Father    Obesity Father    Lung cancer Paternal Grandmother    Colon cancer Neg Hx    Colon polyps Neg Hx    Esophageal cancer Neg Hx    Rectal cancer Neg Hx    Stomach cancer Neg Hx     Current Outpatient Medications on File Prior to Visit  Medication Sig Dispense Refill   albuterol (VENTOLIN HFA) 108 (90 Base) MCG/ACT inhaler Inhale 1-2 puffs into the lungs every 6 (six) hours as needed for wheezing or shortness of breath. 8.5 each 5   aspirin 81 MG tablet Take 2 tablets (161 mg total) by mouth daily. 30 tablet 0   cyanocobalamin (VITAMIN B12) 1000 MCG tablet Take 1 tablet (1,000 mcg total) by mouth daily. 90 tablet 0   ergocalciferol (VITAMIN D2) 1.25 MG (50000 UT) capsule Take 1 capsule (50,000 Units total) by mouth once a week. 12 capsule 0   SYMBICORT 160-4.5 MCG/ACT inhaler TAKE 2 PUFFS BY MOUTH TWICE A DAY 10.2 each 11   Tiotropium Bromide Monohydrate (SPIRIVA RESPIMAT) 1.25 MCG/ACT  AERS Inhale 2 puffs into the lungs daily. 4 g 0   Tiotropium Bromide Monohydrate (SPIRIVA RESPIMAT) 2.5 MCG/ACT AERS INHALE 2 PUFFS BY MOUTH INTO THE LUNGS DAILY 4 each  5   topiramate (TOPAMAX) 25 MG tablet Take 1 tablet (25 mg total) by mouth daily. 30 tablet 0   valsartan (DIOVAN) 160 MG tablet TAKE 1 TABLET BY MOUTH EVERY DAY 30 tablet 0   No current facility-administered medications on file prior to visit.    Allergies  Allergen Reactions   Ativan [Lorazepam] Other (See Comments)    Pt given Ativan.  Pt became unresponsive.       Physical Exam Vitals requested from patient and listed below if patient had equipment and was able to obtain at home for this virtual visit: There were no vitals filed for this visit. Estimated body mass index is 42.9 kg/m as calculated from the following:   Height as of 11/21/22: 6\' 1"  (1.854 m).   Weight as of 11/21/22: 325 lb 3.2 oz (147.5 kg).  EKG (optional): deferred due to virtual visit  GENERAL: alert, oriented, no acute distress detected; full vision exam deferred due to pandemic and/or virtual encounter  HEENT: atraumatic, conjunttiva clear, no obvious abnormalities on inspection of external nose and ears  NECK: normal movements of the head and neck  LUNGS: on inspection no signs of respiratory distress, breathing rate appears normal, no obvious gross SOB, gasping or wheezing  CV: no obvious cyanosis  MS: moves all visible extremities without noticeable abnormality  PSYCH/NEURO: pleasant and cooperative, no obvious depression or anxiety, speech and thought processing grossly intact, Cognitive function grossly intact  Flowsheet Row Office Visit from 01/18/2023 in [facility] [place]  PHQ-9 Total Score 0           01/18/2023   11:51 AM 04/10/2022    2:51 PM 12/27/2021    1:26 PM 05/26/2021   10:08 AM 11/10/2020    2:43 PM  Depression screen PHQ 2/9  Decreased Interest 0 0 0 0 0  Down, Depressed, Hopeless  0 0 0 0 0  PHQ - 2 Score 0 0 0 0 0  Altered sleeping 0 0     Tired, decreased energy 0 0     Change in appetite 0 0     Feeling bad or failure about yourself  0 0     Trouble concentrating 0 0     Moving slowly or fidgety/restless 0 0     Suicidal thoughts 0 0     PHQ-9 Score 0 0     Difficult doing work/chores Not difficult at all           11/07/2019    9:53 AM 11/10/2020    2:45 PM 12/27/2021    1:28 PM 04/10/2022    2:51 PM 01/18/2023   11:52 AM  Fall Risk  Falls in the past year? 0 0 0 0 0  Was there an injury with Fall? 0 0 0 0 0  Fall Risk Category Calculator 0 0 0 0 0  Fall Risk Category (Retired) Low Low Low    (RETIRED) Patient Fall Risk Level Low fall risk  Low fall risk    Patient at Risk for Falls Due to No Fall Risks Impaired vision No Fall Risks No Fall Risks No Fall Risks  Fall risk Follow up Falls evaluation completed;Falls prevention discussed Falls prevention discussed Falls prevention discussed Falls evaluation completed Falls evaluation completed     SUMMARY AND PLAN:  Encounter for Medicare annual wellness exam  Discussed applicable health maintenance/preventive health measures and advised and referred or ordered per patient preferences: -declines the covid shot -due for annual  physical, he agrees to schedule - sent message to schedulers Health Maintenance  Topic Date Due   DTaP/Tdap/Td (1 - Tdap) Never done   Zoster Vaccines- Shingrix (1 of 2) Never done   COVID-19 Vaccine (1) 02/03/2023 (Originally 08/17/1975)   Medicare Annual Wellness (AWV)  01/18/2024   Colonoscopy  10/08/2029   Hepatitis C Screening  Completed   HIV Screening  Completed   HPV VACCINES  Aged [employer] and counseling on the following was provided based on the above review of health and a plan/checklist for the patient, along with additional information discussed, was provided for the patient in the patient instructions :  -Advised on importance of completing advanced  directives, discussed options for completing and provided information in patient instructions as well -Advised and counseled on a healthy lifestyle - including the importance of a healthy diet, regular physical activity, social connections and stress management. -Reviewed patient's current diet. Advised and counseled on a whole foods based healthy diet. A summary of a healthy diet was provided in the Patient Instructions.  -reviewed patient's current physical activity level and provided exercise guidelines for adults. Provided community resources and ideas for safe exercise at home to assist in meeting exercise guideline recommendations in a safe and healthy way.  -Advise yearly dental visits at minimum and regular eye exams -Advised and counseled on alcohol safe limits, risks - advised no more than 2 drinks in any given day Follow up: see patient instructions   Patient Instructions  I really enjoyed getting to talk with you today! I am available on Tuesdays and Thursdays for virtual visits if you have any questions or concerns, or if I can be of any further assistance.   CHECKLIST FROM ANNUAL WELLNESS VISIT:  -Follow up (please call to schedule if not scheduled after visit):   -Please call the office to schedule you inperson physical   -yearly for annual wellness visit with primary care office  Here is a list of your preventive care/health maintenance measures and the plan for each if any are due:  PLAN For any measures below that may be due:  -can get the vaccines at the pharmacy - please let Kandee Keen know when you do and bring receipt so that he can update your chart  Health Maintenance  Topic Date Due   DTaP/Tdap/Td (1 - Tdap) Never done   Zoster Vaccines- Shingrix (1 of 2) Never done   COVID-19 Vaccine (1) 02/03/2023 (Originally 08/17/1975)   Medicare Annual Wellness (AWV)  01/18/2024   Colonoscopy  10/08/2029   Hepatitis C Screening  Completed   HIV Screening  Completed   HPV  VACCINES  Aged Out    -See a dentist at least yearly  -Get your eyes checked and then per your eye specialist's recommendations  -Other issues addressed today:   - please do not consume more than 2 alcoholic drinks in any given day  -I have included below further information regarding a healthy whole foods based diet, physical activity guidelines for adults, stress management and opportunities for social connections. I hope you find this information useful.   -----------------------------------------------------------------------------------------------------------------------------------------------------------------------------------------------------------------------------------------------------------  NUTRITION: -eat real food: lots of colorful vegetables (half the plate) and fruits -5-7 servings of vegetables and fruits per day (fresh or steamed is best), exp. 2 servings of vegetables with lunch and dinner and 2 servings of fruit per day. Berries and greens such as kale and collards are great choices.  -consume on a regular basis: whole grains (make sure  first ingredient on label contains the word "whole"), fresh fruits, fish, nuts, seeds, healthy oils (such as olive oil, avocado oil, grape seed oil) -may eat small amounts of dairy and lean meat on occasion, but avoid processed meats such as ham, bacon, lunch meat, etc. -drink water -try to avoid fast food and pre-packaged foods, processed meat -most experts advise limiting sodium to < 2300mg  per day, should limit further is any chronic conditions such as high blood pressure, heart disease, diabetes, etc. The American Heart Association advised that < 1500mg  is is ideal -try to avoid foods that contain any ingredients with names you do not recognize  -try to avoid sugar/sweets (except for the natural sugar that occurs in fresh fruit) -try to avoid sweet drinks -try to avoid white rice, white bread, pasta (unless whole grain), white or  yellow potatoes  EXERCISE GUIDELINES FOR ADULTS: -if you wish to increase your physical activity, do so gradually and with the approval of your doctor -STOP and seek medical care immediately if you have any chest pain, chest discomfort or trouble breathing when starting or increasing exercise  -move and stretch your body, legs, feet and arms when sitting for long periods -Physical activity guidelines for optimal health in adults: -least 150 minutes per week of aerobic exercise (can talk, but not sing) once approved by your doctor, 20-30 minutes of sustained activity or two 10 minute episodes of sustained activity every day.  -resistance training at least 2 days per week if approved by your doctor -balance exercises 3+ days per week:   Stand somewhere where you have something sturdy to hold onto if you lose balance.    1) lift up on toes, start with 5x per day and work up to 20x   2) stand and lift on leg straight out to the side so that foot is a few inches of the floor, start with 5x each side and work up to 20x each side   3) stand on one foot, start with 5 seconds each side and work up to 20 seconds on each side  If you need ideas or help with getting more active:   -Walk with a Doc: http://www.duncan-williams.com/  -Check out the [place] Active Adults 50+ section on the [place] of [employer] (hiking clubs, book clubs, cards and games, chess, exercise classes, aquatic classes and much more) - see the website for details: https://www.[place].gov/departments/parks-recreation/active-adults50  -YouTube has lots of exercise videos for different ages and abilities as well  -try to include resistance (weight lifting/strength building) and balance exercises twice per week: or the following link for ideas: http://castillo-powell.com/  BuyDucts.dk  STRESS MANAGEMENT: -can try meditating, or just  sitting quietly with deep breathing while intentionally relaxing all parts of your body for 5 minutes daily -if you need further help with stress, anxiety or depression please follow up with your primary doctor or contact the wonderful folks at [facility]: [phone]   ADVANCED HEALTHCARE DIRECTIVES:  [place] Advanced Directives assistance:   ExpressWeek.com.cy  Everyone should have advanced health care directives in place. This is so that you get the care you want, should you ever be in a situation where you are unable to make your own medical decisions.   From the [place] Advanced Directive Website: "Advance Health Care Directives are legal documents in which you give written instructions about your health care if, in the future, you cannot speak for yourself.   A health care power of attorney allows you to name a person  you trust to make your health care decisions if you cannot make them yourself. A declaration of a desire for a natural death (or living will) is document, which states that you desire not to have your life prolonged by extraordinary measures if you have a terminal or incurable illness or if you are in a vegetative state. An advance instruction for mental health treatment makes a declaration of instructions, information and preferences regarding your mental health treatment. It also states that you are aware that the advance instruction authorizes a mental health treatment provider to act according to your wishes. It may also outline your consent or refusal of mental health treatment. A declaration of an anatomical gift allows anyone over the age of 89 to make a gift by will, organ donor card or other document."   Please see the following website or an elder law attorney for forms, FAQs and for completion of advanced directives: [place] TEFL teacher Health Care Directives Advance Health Care  Directives (http://guzman.com/)  Or copy and paste the following to your web browser: PoshChat.fi         Terressa Koyanagi, DO

## 2023-01-26 ENCOUNTER — Other Ambulatory Visit: Payer: Self-pay | Admitting: Emergency Medicine

## 2023-01-26 NOTE — Telephone Encounter (Signed)
Please advise if you are okay still refilling medication or if you want either PCP or cards to take over.

## 2023-01-27 DIAGNOSIS — G4733 Obstructive sleep apnea (adult) (pediatric): Secondary | ICD-10-CM | POA: Diagnosis not present

## 2023-01-29 ENCOUNTER — Telehealth: Payer: Self-pay | Admitting: Emergency Medicine

## 2023-01-29 DIAGNOSIS — G4733 Obstructive sleep apnea (adult) (pediatric): Secondary | ICD-10-CM

## 2023-01-29 NOTE — Telephone Encounter (Signed)
Please order: Auto-set CPAP 5-20cmH2O with heated humidity and best-fit mask ( I believe his current mask works well for him).

## 2023-01-29 NOTE — Telephone Encounter (Signed)
PER DME   We need this order updated with the settings and Heated humidification listed on the order to process.   Thank you ,   Luellen Pucker

## 2023-01-30 DIAGNOSIS — B9689 Other specified bacterial agents as the cause of diseases classified elsewhere: Secondary | ICD-10-CM | POA: Diagnosis not present

## 2023-01-30 DIAGNOSIS — L03116 Cellulitis of left lower limb: Secondary | ICD-10-CM | POA: Diagnosis not present

## 2023-01-30 DIAGNOSIS — S8992XA Unspecified injury of left lower leg, initial encounter: Secondary | ICD-10-CM | POA: Diagnosis not present

## 2023-02-05 ENCOUNTER — Telehealth: Payer: Self-pay | Admitting: Emergency Medicine

## 2023-02-05 NOTE — Telephone Encounter (Signed)
Order placed

## 2023-02-05 NOTE — Telephone Encounter (Signed)
Called and spoke with the pt  He states he checked his o2 over the weekend and it was between 88-94% on 3lpm  He denies any increased SOB, cough, CP, fever  He is currently on Doxy for cellulitis  He states "feels fine" I advised to make sure hands are warm when checks sats and if consistently below 90% needs to seek emergent care  Pt verbalized understanding and nothing further needed

## 2023-02-05 NOTE — Telephone Encounter (Signed)
Patient states oxygen levels in the 80 %. Patient states has cellulitis. Would like to know if it will cause COPD. Has been taking antibx. Patient phone number is [phone].

## 2023-02-26 ENCOUNTER — Other Ambulatory Visit: Payer: Self-pay | Admitting: Emergency Medicine

## 2023-02-27 DIAGNOSIS — G4733 Obstructive sleep apnea (adult) (pediatric): Secondary | ICD-10-CM | POA: Diagnosis not present

## 2023-03-06 DIAGNOSIS — G4733 Obstructive sleep apnea (adult) (pediatric): Secondary | ICD-10-CM | POA: Diagnosis not present

## 2023-03-11 ENCOUNTER — Telehealth: Payer: Self-pay | Admitting: Pulmonary Disease

## 2023-03-12 ENCOUNTER — Encounter (HOSPITAL_COMMUNITY): Payer: Self-pay | Admitting: Emergency Medicine

## 2023-03-12 ENCOUNTER — Emergency Department (HOSPITAL_COMMUNITY)
Admission: EM | Admit: 2023-03-12 | Discharge: 2023-03-12 | Disposition: A | Discharge status: Home / Self Care | Payer: 59 | Attending: Emergency Medicine | Admitting: Emergency Medicine

## 2023-03-12 ENCOUNTER — Other Ambulatory Visit: Payer: Self-pay

## 2023-03-12 ENCOUNTER — Emergency Department (HOSPITAL_COMMUNITY): Payer: 59

## 2023-03-12 ENCOUNTER — Telehealth: Payer: Self-pay | Admitting: Emergency Medicine

## 2023-03-12 DIAGNOSIS — Z7951 Long term (current) use of inhaled steroids: Secondary | ICD-10-CM | POA: Diagnosis not present

## 2023-03-12 DIAGNOSIS — Z7952 Long term (current) use of systemic steroids: Secondary | ICD-10-CM | POA: Diagnosis not present

## 2023-03-12 DIAGNOSIS — R600 Localized edema: Secondary | ICD-10-CM | POA: Insufficient documentation

## 2023-03-12 DIAGNOSIS — R0602 Shortness of breath: Secondary | ICD-10-CM | POA: Diagnosis not present

## 2023-03-12 DIAGNOSIS — J441 Chronic obstructive pulmonary disease with (acute) exacerbation: Secondary | ICD-10-CM | POA: Diagnosis not present

## 2023-03-12 DIAGNOSIS — Z7982 Long term (current) use of aspirin: Secondary | ICD-10-CM | POA: Insufficient documentation

## 2023-03-12 DIAGNOSIS — J449 Chronic obstructive pulmonary disease, unspecified: Secondary | ICD-10-CM | POA: Diagnosis not present

## 2023-03-12 LAB — CBC WITH DIFFERENTIAL/PLATELET
Abs Immature Granulocytes: 0.02 10*3/uL (ref 0.00–0.07)
Basophils Absolute: 0.1 10*3/uL (ref 0.0–0.1)
Basophils Relative: 1 %
Eosinophils Absolute: 0.2 10*3/uL (ref 0.0–0.5)
Eosinophils Relative: 3 %
HCT: 46.8 % (ref 39.0–52.0)
Hemoglobin: 15.3 g/dL (ref 13.0–17.0)
Immature Granulocytes: 0 %
Lymphocytes Relative: 18 %
Lymphs Abs: 1.3 10*3/uL (ref 0.7–4.0)
MCH: 33.8 pg (ref 26.0–34.0)
MCHC: 32.7 g/dL (ref 30.0–36.0)
MCV: 103.3 fL — ABNORMAL HIGH (ref 80.0–100.0)
Monocytes Absolute: 0.7 10*3/uL (ref 0.1–1.0)
Monocytes Relative: 9 %
Neutro Abs: 5.1 10*3/uL (ref 1.7–7.7)
Neutrophils Relative %: 69 %
Platelets: 205 10*3/uL (ref 150–400)
RBC: 4.53 MIL/uL (ref 4.22–5.81)
RDW: 12.3 % (ref 11.5–15.5)
WBC: 7.4 10*3/uL (ref 4.0–10.5)
nRBC: 0 % (ref 0.0–0.2)

## 2023-03-12 LAB — COMPREHENSIVE METABOLIC PANEL
ALT: 49 U/L — ABNORMAL HIGH (ref 0–44)
AST: 29 U/L (ref 15–41)
Albumin: 3.7 g/dL (ref 3.5–5.0)
Alkaline Phosphatase: 44 U/L (ref 38–126)
Anion gap: 10 (ref 5–15)
BUN: 17 mg/dL (ref 6–20)
CO2: 28 mmol/L (ref 22–32)
Calcium: 9.5 mg/dL (ref 8.9–10.3)
Chloride: 100 mmol/L (ref 98–111)
Creatinine, Ser: 1.03 mg/dL (ref 0.61–1.24)
GFR, Estimated: >60 mL/min (ref >60)
Glucose, Bld: 111 mg/dL — ABNORMAL HIGH (ref 70–99)
Potassium: 4.7 mmol/L (ref 3.5–5.1)
Sodium: 138 mmol/L (ref 135–145)
Total Bilirubin: 0.7 mg/dL (ref 0.0–1.2)
Total Protein: 6.8 g/dL (ref 6.5–8.1)

## 2023-03-12 LAB — TROPONIN I (HIGH SENSITIVITY)
Troponin I (High Sensitivity): 4 ng/L (ref <18)
Troponin I (High Sensitivity): 4 ng/L (ref <18)

## 2023-03-12 LAB — BRAIN NATRIURETIC PEPTIDE: B Natriuretic Peptide: 14.4 pg/mL (ref 0.0–100.0)

## 2023-03-12 MED ORDER — ALBUTEROL SULFATE HFA 108 (90 BASE) MCG/ACT IN AERS
2.0000 | INHALATION_SPRAY | RESPIRATORY_TRACT | Status: DC | PRN
  Filled 2023-03-12: qty 6.7

## 2023-03-12 MED ORDER — ALBUTEROL SULFATE (2.5 MG/3ML) 0.083% IN NEBU: 2.5000 mg | INHALATION_SOLUTION | RESPIRATORY_TRACT | 1 refills | Status: AC | PRN

## 2023-03-12 MED ORDER — IPRATROPIUM-ALBUTEROL 0.5-2.5 (3) MG/3ML IN SOLN
3.0000 mL | Freq: Once | RESPIRATORY_TRACT | Status: AC
  Administered 2023-03-12: 3 mL via RESPIRATORY_TRACT
  Filled 2023-03-12: qty 3

## 2023-03-12 MED ORDER — PREDNISONE 20 MG PO TABS: ORAL_TABLET | ORAL | 0 refills | Status: DC

## 2023-03-12 MED ORDER — METHYLPREDNISOLONE SODIUM SUCC 125 MG IJ SOLR
125.0000 mg | Freq: Once | INTRAMUSCULAR | Status: AC
Start: 2023-03-12 — End: 2023-03-12
  Administered 2023-03-12: 125 mg via INTRAVENOUS
  Filled 2023-03-12: qty 2

## 2023-03-12 NOTE — Telephone Encounter (Signed)
 Patient is having low oxygen levels and a cough that produces a greenish/gray colored mucus. He's taken old albuterol and a zpak. He would like an antibiotic. Please call and advise. [phone]  Ph[facility]: [facility] [address] [facility]

## 2023-03-12 NOTE — ED Triage Notes (Signed)
 Pt via POV c/o SOB and increased need for supplemental O2 x 3 days. Pt states he only uses O2 PRN at home but has needed it consistently for several days. O2 83% on room air in triage. Pt has hx COPD and normal O2 sat is 92-93% with oxygen . Pt also has a 5/10 headache intermittently.

## 2023-03-12 NOTE — ED Notes (Signed)
 Patient transported to X-ray

## 2023-03-12 NOTE — Telephone Encounter (Signed)
 Patient checking on message for low oxygen levels and cough. Patient phone number is [phone].

## 2023-03-12 NOTE — Discharge Instructions (Signed)
 1.  Start taking prednisone  this evening as prescribed. 2.  Use your albuterol  nebulizer every 4-6 hours as needed for the next several days. 3.  Return to the emergency department if you have any worsening shortness of breath, chest pain or other concerning changes. 4.  Follow-up with your pulmonologist as soon as possible for recheck.

## 2023-03-12 NOTE — ED Notes (Signed)
 Patient discharged by this RN. Patient ambulatory to lobby at time of discharge.

## 2023-03-12 NOTE — Telephone Encounter (Signed)
 Patient is at the [facility] Emergency Room. Patient phone number is [phone].

## 2023-03-12 NOTE — Telephone Encounter (Signed)
 Agrre - can we get him for an acute OV w RB or another provider?

## 2023-03-12 NOTE — Telephone Encounter (Signed)
 Pt states his oxygen  was at 93 with o2. Pt states his 02 would drop to 83 without o2. Pt states he has not had to be on o2 except for when he sleeps. Pt states he already had an xray at the ED. Pt developed a cough or possible cold. Pt states he is coughing up green/grey mucous. Pt is currently on an IV and on 3-4 Liters con. oxygen . NFN. Pt needs a f/u appointment with Pulmonologist for hfu.

## 2023-03-12 NOTE — ED Provider Notes (Signed)
 [place] EMERGENCY DEPARTMENT AT [facility] Provider Note   CSN: [id number] Arrival date & time: 03/12/23  1219     History  Chief Complaint  Patient presents with   Shortness of Breath    Billy [name] is a 53 y.o. male.  HPI Patient has history of COPD.  He quit smoking 5 years ago.  He reports that he uses nighttime oxygen  with CPAP but does not typically use daytime oxygen .  He reports for several weeks now he has had a cough and chest congestion.  He reports sometimes his oxygen  level at home now is getting down into the lower 80s particularly with exertion.  He has started to use some of his oxygen  during the daytime which she typically only used at night.  Patient reports that he tried a Z-Pak and finished it with no improvement of symptoms.  He denies chest pain, no documented fever, minimal swelling in the legs, no significant pain.    Home Medications Prior to Admission medications   Medication Sig Start Date End Date Taking? Authorizing Provider  albuterol  (PROVENTIL ) (2.5 MG/3ML) 0.083% nebulizer solution Take 3 mLs (2.5 mg total) by nebulization every 4 (four) hours as needed for wheezing or shortness of breath. 03/12/23  Yes Armenta Canning, MD  predniSONE  (DELTASONE ) 20 MG tablet 2 tabs po daily x 4 days 03/12/23  Yes Jayzen Paver, Canning, MD  albuterol  (VENTOLIN  HFA) 108 [phone] Base) MCG/ACT inhaler Inhale 1-2 puffs into the lungs every 6 (six) hours as needed for wheezing or shortness of breath. 04/20/22   Shelah Lamar RAMAN, MD  aspirin  81 MG tablet Take 2 tablets (161 mg total) by mouth daily. 03/27/18   Ghimire, Donalda HERO, MD  cyanocobalamin  (VITAMIN B12) 1000 MCG tablet Take 1 tablet (1,000 mcg total) by mouth daily. 01/24/22   Francyne Romano, MD  ergocalciferol  (VITAMIN D2) 1.25 MG (50000 UT) capsule Take 1 capsule (50,000 Units total) by mouth once a week. 01/24/22   Francyne Romano, MD  SYMBICORT  160-4.5 MCG/ACT inhaler TAKE 2 PUFFS BY MOUTH TWICE A  DAY 07/27/22   Shelah Lamar RAMAN, MD  Tiotropium Bromide  Monohydrate (SPIRIVA  RESPIMAT) 1.25 MCG/ACT AERS Inhale 2 puffs into the lungs daily. 05/16/22   Byrum, Robert S, MD  Tiotropium Bromide  Monohydrate (SPIRIVA  RESPIMAT) 2.5 MCG/ACT AERS INHALE 2 PUFFS BY MOUTH INTO THE LUNGS DAILY 10/20/22   Shelah Lamar RAMAN, MD  topiramate  (TOPAMAX ) 25 MG tablet Take 1 tablet (25 mg total) by mouth daily. 03/08/22   Francyne Romano, MD  valsartan  (DIOVAN ) 160 MG tablet TAKE 1 TABLET BY MOUTH EVERY DAY 02/26/23   Shelah Lamar RAMAN, MD      Allergies    Ativan  [lorazepam ]    Review of Systems   Review of Systems  Physical Exam Updated Vital Signs BP 103/63   Pulse 89   Temp 98 F (36.7 C) (Oral)   Resp 17   Ht 6' 1 (1.854 m)   Wt (!) 147.4 kg   SpO2 95%   BMI 42.88 kg/m  Physical Exam Constitutional:      Comments: Alert nontoxic.  Mild increased work of breathing.  Central obesity.  HENT:     Mouth/Throat:     Pharynx: Oropharynx is clear.  Eyes:     Extraocular Movements: Extraocular movements intact.  Cardiovascular:     Rate and Rhythm: Normal rate and regular rhythm.  Pulmonary:     Comments: Some crackles and wheezes in the left lower and midlung field.  A fine crackle in the base of the right lung. Abdominal:     General: There is no distension.     Palpations: Abdomen is soft.     Tenderness: There is no abdominal tenderness.  Musculoskeletal:     Comments: Trace pitting edema bilateral lower extremities.  Skin changes consistent with chronic venous stasis.  Calves nontender.  Skin:    General: Skin is warm and dry.  Neurological:     General: No focal deficit present.     Mental Status: He is oriented to person, place, and time.     Motor: No weakness.     Coordination: Coordination normal.  Psychiatric:        Mood and Affect: Mood normal.     ED Results / Procedures / Treatments   Labs (all labs ordered are listed, but only abnormal results are displayed) Labs  Reviewed  COMPREHENSIVE METABOLIC PANEL - Abnormal; Notable for the following components:      Result Value   Glucose, Bld 111 (*)    ALT 49 (*)    All other components within normal limits  CBC WITH DIFFERENTIAL/PLATELET - Abnormal; Notable for the following components:   MCV 103.3 (*)    All other components within normal limits  BRAIN NATRIURETIC PEPTIDE  TROPONIN I (HIGH SENSITIVITY)  TROPONIN I (HIGH SENSITIVITY)    EKG EKG Interpretation Date/Time:  Monday March 12 2023 12:54:00 EST Ventricular Rate:  90 PR Interval:  180 QRS Duration:  112 QT Interval:  378 QTC Calculation: 462 R Axis:   264  Text Interpretation: Sinus rhythm with occasional Premature ventricular complexes and Premature atrial complexes Pulmonary disease pattern Right bundle branch block Septal infarct , age undetermined Abnormal ECG When compared with ECG of 17-Dec-2022 02:17, PREVIOUS ECG IS PRESENT PVC otherwise no sig change from previous Confirmed by Armenta Canning [phone]) on 03/12/2023 1:29:36 PM  Radiology DG Chest 2 View Result Date: 03/12/2023 CLINICAL DATA:  Shortness of breath. EXAM: CHEST - 2 VIEW COMPARISON:  Chest radiograph dated December 17, 2022. FINDINGS: The heart size and mediastinal contours are unchanged. Hyperinflation with flattening of the diaphragms, compatible with COPD. Similar left basilar linear scarring. No appreciable focal consolidation, pleural effusion, or pneumothorax. No acute osseous abnormality. IMPRESSION: 1. No acute cardiopulmonary findings. 2. COPD. Electronically Signed   By: Harrietta Sherry M.D.   On: 03/12/2023 15:07    Procedures Procedures    Medications Ordered in ED Medications  albuterol  (VENTOLIN  HFA) 108 (90 Base) MCG/ACT inhaler 2 puff (has no administration in time range)  ipratropium-albuterol  (DUONEB) 0.5-2.5 (3) MG/3ML nebulizer solution 3 mL (3 mLs Nebulization Given 03/12/23 1540)  methylPREDNISolone  sodium succinate (SOLU-MEDROL ) 125 mg/2 mL  injection 125 mg (125 mg Intravenous Given 03/12/23 1540)    ED Course/ Medical Decision Making/ A&P                                 Medical Decision Making Amount and/or Complexity of Data Reviewed Labs: ordered. Radiology: ordered.  Risk Prescription drug management.   Patient presents as outlined.  He does have known history of COPD and uses nighttime oxygen  and CPAP.  He has had productive cough that has been persistent now for several weeks.  Worsening symptoms of shortness of breath and documented hypoxia at home.  Will proceed with broad diagnostic evaluation for ACS\CHF\pneumonia.  Chest x-ray no focal consolidations.  BNP 14 troponin 4 white count  7.4  At this time, symptoms are most suggestive of COPD exacerbation.  Patient does not have typical ACS symptoms and troponin is negative.  BNP is at 14 and no congestion on chest x-ray.  At this time I feel low probability for CHF.  Patient is nontoxic.  Alert.  Will proceed with treatment for COPD.  He is dyspneic at rest.  Will have him trial outpatient prednisone  and uses albuterol  nebulizer machine.  Low threshold for return.  I have reviewed return precautions.  Patient voiced understanding.        Final Clinical Impression(s) / ED Diagnoses Final diagnoses:  COPD exacerbation (HCC)    Rx / DC Orders ED Discharge Orders          Ordered    albuterol  (PROVENTIL ) (2.5 MG/3ML) 0.083% nebulizer solution  Every 4 hours PRN        03/12/23 1623    predniSONE  (DELTASONE ) 20 MG tablet        03/12/23 1623              Armenta Canning, MD 03/12/23 1628

## 2023-03-13 ENCOUNTER — Telehealth: Payer: Self-pay

## 2023-03-13 DIAGNOSIS — J449 Chronic obstructive pulmonary disease, unspecified: Secondary | ICD-10-CM | POA: Diagnosis not present

## 2023-03-13 NOTE — Transitions of Care (Post Inpatient/ED Visit) (Signed)
 03/13/2023  Name: Billy [name] MRN: [id number] DOB: May 21, 1970  Today's TOC FU Call Status: Today's TOC FU Call Status:: Successful TOC FU Call Completed TOC FU Call Complete Date: 03/13/23 Patient's Name and Date of Birth confirmed.  Transition Care Management Follow-up Telephone Call Date of Discharge: 03/12/23 Discharge Facility: [facility]) Type of Discharge: Emergency Department Reason for ED Visit: Other:, Respiratory Respiratory Diagnosis: COPD Exacerbation How have you been since you were released from the hospital?: Better Any questions or concerns?: No  Items Reviewed: Did you receive and understand the discharge instructions provided?: Yes Medications obtained,verified, and reconciled?: Yes (Medications Reviewed) Any new allergies since your discharge?: No Dietary orders reviewed?: Yes Do you have support at home?: Yes People in Home: significant other  Medications Reviewed Today: Medications Reviewed Today     Reviewed by Emmitt Pan, LPN (Licensed Practical Nurse) on 03/13/23 at 1426  Med List Status: <None>   Medication Order Taking? Sig Documenting Provider Last Dose Status Informant  albuterol  (PROVENTIL ) (2.5 MG/3ML) 0.083% nebulizer solution [id number]  Take 3 mLs (2.5 mg total) by nebulization every 4 (four) hours as needed for wheezing or shortness of breath. Armenta Canning, MD  Active   albuterol  (VENTOLIN  HFA) 108 (90 Base) MCG/ACT inhaler [id number] No Inhale 1-2 puffs into the lungs every 6 (six) hours as needed for wheezing or shortness of breath. Shelah Lamar RAMAN, MD Taking Active   aspirin  81 MG tablet [id number] No Take 2 tablets (161 mg total) by mouth daily. Raenelle Donalda HERO, MD Taking Active   cyanocobalamin  (VITAMIN B12) 1000 MCG tablet [id number] No Take 1 tablet (1,000 mcg total) by mouth daily. Francyne Romano, MD Taking Active   ergocalciferol  (VITAMIN D2) 1.25 MG (50000 UT) capsule [id number] No Take 1 capsule (50,000 Units  total) by mouth once a week. Francyne Romano, MD Taking Active   predniSONE  (DELTASONE ) 20 MG tablet [id number]  2 tabs po daily x 4 days Armenta Canning, MD  Active   SYMBICORT  160-4.5 MCG/ACT inhaler [id number] No TAKE 2 PUFFS BY MOUTH TWICE A DAY Byrum, Lamar RAMAN, MD Taking Active   Tiotropium Bromide  Monohydrate (SPIRIVA  RESPIMAT) 1.25 MCG/ACT AERS [id number] No Inhale 2 puffs into the lungs daily. Shelah Lamar RAMAN, MD Taking Active   Tiotropium Bromide  Monohydrate (SPIRIVA  RESPIMAT) 2.5 MCG/ACT AERS [id number] No INHALE 2 PUFFS BY MOUTH INTO THE LUNGS DAILY Byrum, Lamar RAMAN, MD Taking Active   topiramate  (TOPAMAX ) 25 MG tablet [id number] No Take 1 tablet (25 mg total) by mouth daily. Francyne Romano, MD Taking Active   valsartan  (DIOVAN ) 160 MG tablet [id number]  TAKE 1 TABLET BY MOUTH EVERY DAY Shelah Lamar RAMAN, MD  Active             Home Care and Equipment/Supplies: Were Home Health Services Ordered?: NA Any new equipment or medical supplies ordered?: NA  Functional Questionnaire: Do you need assistance with bathing/showering or dressing?: No Do you need assistance with meal preparation?: No Do you need assistance with eating?: No Do you have difficulty maintaining continence: No Do you need assistance with getting out of bed/getting out of a chair/moving?: No Do you have difficulty managing or taking your medications?: No  Follow up appointments reviewed: PCP Follow-up appointment confirmed?: No (declined) Specialist Hospital Follow-up appointment confirmed?: Yes Date of Specialist follow-up appointment?: 04/12/23 Follow-Up Specialty Provider:: pulmo Do you need transportation to your follow-up appointment?: No Do you understand care options if your condition(s) worsen?: Yes-patient verbalized understanding    SIGNATURE Pan Emmitt,  LPN [facility] Nurse Health Advisor Direct Dial [phone]

## 2023-03-21 NOTE — Telephone Encounter (Signed)
This is not soon enough for an acute visit - is there not an APP or other provider that could see sooner?

## 2023-03-22 ENCOUNTER — Ambulatory Visit (INDEPENDENT_AMBULATORY_CARE_PROVIDER_SITE_OTHER): Payer: 59 | Admitting: Pulmonary Disease

## 2023-03-22 ENCOUNTER — Encounter: Payer: Self-pay | Admitting: Pulmonary Disease

## 2023-03-22 VITALS — BP 102/69 | HR 93 | Ht 73.0 in | Wt 329.2 lb

## 2023-03-22 DIAGNOSIS — E66813 Obesity, class 3: Secondary | ICD-10-CM

## 2023-03-22 DIAGNOSIS — G4733 Obstructive sleep apnea (adult) (pediatric): Secondary | ICD-10-CM

## 2023-03-22 DIAGNOSIS — I10 Essential (primary) hypertension: Secondary | ICD-10-CM | POA: Diagnosis not present

## 2023-03-22 DIAGNOSIS — F17200 Nicotine dependence, unspecified, uncomplicated: Secondary | ICD-10-CM

## 2023-03-22 DIAGNOSIS — J449 Chronic obstructive pulmonary disease, unspecified: Secondary | ICD-10-CM | POA: Diagnosis not present

## 2023-03-22 DIAGNOSIS — J9691 Respiratory failure, unspecified with hypoxia: Secondary | ICD-10-CM

## 2023-03-22 MED ORDER — SPIRIVA RESPIMAT 2.5 MCG/ACT IN AERS: 1.0000 | INHALATION_SPRAY | Freq: Two times a day (BID) | RESPIRATORY_TRACT | 5 refills | Status: AC

## 2023-03-22 MED ORDER — SYMBICORT 160-4.5 MCG/ACT IN AERO: 2.0000 | INHALATION_SPRAY | Freq: Every day | RESPIRATORY_TRACT | 11 refills | Status: DC

## 2023-03-22 MED ORDER — ALBUTEROL SULFATE HFA 108 (90 BASE) MCG/ACT IN AERS: 1.0000 | INHALATION_SPRAY | Freq: Four times a day (QID) | RESPIRATORY_TRACT | 5 refills | Status: DC | PRN

## 2023-03-22 MED ORDER — TOPIRAMATE 25 MG PO TABS: 25.0000 mg | ORAL_TABLET | Freq: Every day | ORAL | 3 refills | Status: DC

## 2023-03-22 NOTE — Patient Instructions (Signed)
VISIT SUMMARY:  During today's visit, we discussed your recent COPD exacerbation, weight gain, and other health concerns. You reported a severe COPD episode that required hospital treatment but are now feeling back to normal. We also talked about your weight gain and interest in using Topiramate to help with weight loss. Additionally, we reviewed your hypertension, sleep apnea, and a chronic wound on your leg.  YOUR PLAN:  -COPD: Chronic Obstructive Pulmonary Disease (COPD) is a lung condition that makes it hard to breathe. You recently had a severe episode but are now stable. Continue using your current inhalers (Symbicort, Spiriva, and albuterol as needed). All inhalers have been renewed, and you should follow up with Dr. Corliss Blacker in 3-6 months.  -OBESITY: Obesity is a condition where excess body fat negatively affects your health. It can worsen COPD and sleep apnea. You will start taking Topiramate 25mg  to help curb your appetite. Please follow up with your primary care provider for further weight loss management.  -HYPERTENSION: Hypertension is high blood pressure. Your condition is stable with valsartan. Continue taking valsartan as prescribed.  -SLEEP APNEA: Sleep apnea is a sleep disorder where breathing repeatedly stops and starts. Your condition is stable, and a new CPAP machine is on order. Continue with your current management.  -LOWER EXTREMITY WOUND: You have a chronic, non-healing wound on your leg. Continue with your current wound care. If the wound does not heal, we may need to evaluate you for venous insufficiency.  -ALCOHOL USE: You consume beer daily. Discuss your alcohol use and its potential impact on your health with your primary care provider.  INSTRUCTIONS:  Follow up with Dr. Corliss Blacker in 3-6 months for COPD management. Please see your primary care provider for further management of weight loss and to discuss your alcohol use.

## 2023-03-22 NOTE — Progress Notes (Signed)
Billy [name]    [id number]    09-06-1970  Primary Care Physician:Byrum, Les Pou, MD  Referring Physician: Leslye Peer, MD [address] [place],  [place] [address]  Chief complaint: Acute visit, follow-up after ED visit for COPD exacerbation  HPI: 53 y.o. who  has a past medical history of Alcohol use, Allergy, COPD (chronic obstructive pulmonary disease) (HCC), Drug use, Edema of both lower extremities, History of bronchitis, Hypertension, Obesity, Oxygen deficiency, PFO (patent foramen ovale), Polycythemia, PVD (peripheral vascular disease) (HCC), Sleep apnea, SOB (shortness of breath), Substance abuse (HCC), and Tobacco abuse.   Discussed the use of AI scribe software for clinical note transcription with the patient, who gave verbal consent to proceed.  Follows with Dr. Delton Coombes.  The patient, with a history of severe COPD, sleep apnea, and hypertension, presents with concerns about recent weight gain and a recent COPD exacerbation. The patient reports that he had been doing well with his COPD management on Symbicort and Spiriva, but recently experienced a severe exacerbation that led to a hospital visit. The patient reports that his oxygen levels dropped significantly during this episode, causing significant concern. The patient was treated with steroids and albuterol breathing treatments, and reports feeling back to normal now.  All ED records reviewed and imaging reviewed in detail  The patient also expresses concern about his weight gain, which he believes is contributing to his COPD and sleep apnea. The patient reports a history of successful weight loss, but has recently gained weight back due to stress and changes in eating habits. The patient is interested in trying Topiramate, a medication previously prescribed by a weight loss doctor, to help curb his appetite and assist with weight loss.   Outpatient Encounter Medications as of 03/22/2023   Medication Sig   albuterol (PROVENTIL) (2.5 MG/3ML) 0.083% nebulizer solution Take 3 mLs (2.5 mg total) by nebulization every 4 (four) hours as needed for wheezing or shortness of breath.   aspirin 81 MG tablet Take 2 tablets (161 mg total) by mouth daily.   cyanocobalamin (VITAMIN B12) 1000 MCG tablet Take 1 tablet (1,000 mcg total) by mouth daily.   ergocalciferol (VITAMIN D2) 1.25 MG (50000 UT) capsule Take 1 capsule (50,000 Units total) by mouth once a week.   predniSONE (DELTASONE) 20 MG tablet 2 tabs po daily x 4 days   valsartan (DIOVAN) 160 MG tablet TAKE 1 TABLET BY MOUTH EVERY DAY   [DISCONTINUED] albuterol (VENTOLIN HFA) 108 (90 Base) MCG/ACT inhaler Inhale 1-2 puffs into the lungs every 6 (six) hours as needed for wheezing or shortness of breath.   [DISCONTINUED] SYMBICORT 160-4.5 MCG/ACT inhaler TAKE 2 PUFFS BY MOUTH TWICE A DAY   [DISCONTINUED] Tiotropium Bromide Monohydrate (SPIRIVA RESPIMAT) 1.25 MCG/ACT AERS Inhale 2 puffs into the lungs daily.   [DISCONTINUED] Tiotropium Bromide Monohydrate (SPIRIVA RESPIMAT) 2.5 MCG/ACT AERS INHALE 2 PUFFS BY MOUTH INTO THE LUNGS DAILY   [DISCONTINUED] topiramate (TOPAMAX) 25 MG tablet Take 1 tablet (25 mg total) by mouth daily.   albuterol (VENTOLIN HFA) 108 (90 Base) MCG/ACT inhaler Inhale 1-2 puffs into the lungs every 6 (six) hours as needed for wheezing or shortness of breath.   SYMBICORT 160-4.5 MCG/ACT inhaler Inhale 2 puffs into the lungs daily.   Tiotropium Bromide Monohydrate (SPIRIVA RESPIMAT) 2.5 MCG/ACT AERS Inhale 1 puff into the lungs in the morning and at bedtime.   topiramate (TOPAMAX) 25 MG tablet Take 1 tablet (25 mg  total) by mouth daily.   No facility-administered encounter medications on file as of 03/22/2023.    Physical Exam: Blood pressure 102/69, pulse 93, height 6\' 1"  (1.854 m), weight (!) 329 lb 3.2 oz (149.3 kg), SpO2 91%. Gen:      No acute distress HEENT:  EOMI, sclera anicteric Neck:     No masses; no  thyromegaly Lungs:    Clear to auscultation bilaterally; normal respiratory effort CV:         Regular rate and rhythm; no murmurs Abd:      + bowel sounds; soft, non-tender; no palpable masses, no distension Ext:    No edema; adequate peripheral perfusion Skin:      Warm and dry; no rash Neuro: alert and oriented x 3 Psych: normal mood and affect  Data Reviewed: Imaging: Chest x-ray 03/12/2023-hyperinflation, no acute cardiopulmonary abnormalities. I have reviewed the images personally.  PFTs: 11/24/2019 FVC 3.82 [70%], FEV1 1.85 [44%], F/F48, TLC 7.37 [121%], DLCO 31.20 [95%] Severe COPD with bronchodilator response, hyperinflation   Labs:  Assessment and Plan COPD Recent exacerbation with hypoxia, now resolved. Stable on Symbicort, Spiriva, and albuterol as needed. -Continue current inhalers. -Renew all inhalers including albuterol. - Referral for screening CT of lung -Follow up with Dr. Corliss Blacker in 3-6 months.  Obesity Contributing to dyspnea and sleep apnea. Previous unsuccessful attempts at weight loss. Interest in trying topiramate for appetite suppression which as prescribed by a weight loss specialist  -Start topiramate 25mg  for appetite suppression, with a 78-month supply and no refills -Refer to primary care provider for further management of weight loss.  Hypertension Stable on valsartan. -Continue valsartan as prescribed.  Sleep Apnea Stable, new CPAP machine on order. -Continue current management.  Lower extremity wound Chronic, non-healing wound on leg. No signs of infection. -Continue current wound care. -Consider evaluation for venous insufficiency if wound does not heal.  Alcohol use Daily beer consumption. -Discuss alcohol use and potential impact on health with primary care provider.   Recommendations: Continue inhalers Screening CT referral Topiramate for weight loss.  Further follow-up with primary care Continue CPAP  Chilton Greathouse  MD [facility] 03/22/2023, 9:06 AM  CC: Leslye Peer, MD

## 2023-03-26 ENCOUNTER — Encounter: Payer: Self-pay | Admitting: Family Medicine

## 2023-03-26 ENCOUNTER — Ambulatory Visit (INDEPENDENT_AMBULATORY_CARE_PROVIDER_SITE_OTHER): Payer: 59 | Admitting: Family Medicine

## 2023-03-26 ENCOUNTER — Ambulatory Visit (HOSPITAL_BASED_OUTPATIENT_CLINIC_OR_DEPARTMENT_OTHER): Payer: Self-pay | Admitting: Emergency Medicine

## 2023-03-26 VITALS — BP 106/62 | HR 79 | Temp 98.1°F | Wt 328.0 lb

## 2023-03-26 DIAGNOSIS — S80812D Abrasion, left lower leg, subsequent encounter: Secondary | ICD-10-CM

## 2023-03-26 DIAGNOSIS — L089 Local infection of the skin and subcutaneous tissue, unspecified: Secondary | ICD-10-CM | POA: Diagnosis not present

## 2023-03-26 MED ORDER — MUPIROCIN 2 % EX OINT: 1.0000 | TOPICAL_OINTMENT | Freq: Two times a day (BID) | CUTANEOUS | 0 refills | Status: DC

## 2023-03-26 NOTE — Telephone Encounter (Signed)
Noted

## 2023-03-26 NOTE — Progress Notes (Signed)
   Subjective:    Patient ID: Billy [name], male    DOB: 03/21/1970, 53 y.o.   MRN: [id number]  HPI Here to check a wound on the left shin that will not heal. About 2 months ago he hit the shin against a wooden crate and a small blod blister formed. Then a few weeks later this opened, and he noticed a foul smelling drainage from the wound. He went to an urgent care on 01-30-23, and they gave him one week of Doxycycline and Keflex. This did not help. The wound is still draining and tender. He has chronic swelling in both legs, and he normally wears compression stockings. He has not worn these for several weeks now.    Review of Systems  Constitutional: Negative.   Respiratory: Negative.    Cardiovascular:  Positive for leg swelling.  Skin:  Positive for wound.       Objective:   Physical Exam Constitutional:      Appearance: Normal appearance.  Cardiovascular:     Rate and Rhythm: Normal rate and regular rhythm.     Pulses: Normal pulses.     Heart sounds: Normal heart sounds.  Pulmonary:     Effort: Pulmonary effort is normal.     Breath sounds: Normal breath sounds.  Musculoskeletal:     Comments: 3+ edema in both lower legs   Skin:    Comments: The left shin has a 1.5 cm open wound that looks clean. No surrounding erythema or warmth   Neurological:     Mental Status: He is alert.           Assessment & Plan:  He has a non-healing ulcer on the left leg. We will treat with Mupiricin ointment BID. If this does not help, we will refer him to the Wound Clinic.  Gershon Crane, MD

## 2023-03-26 NOTE — Telephone Encounter (Signed)
  Chief Complaint: sore Symptoms: open sore on leg, fould odor, painful to touch Frequency: since beginning of this month Pertinent Negatives: Patient denies fever, drainage Disposition: [] ED /[] Urgent Care (no appt availability in office) / [x] Appointment(In office/virtual)/ []  [facility]/ [] Home Care/ [] Refused Recommended Disposition /[] [facility]/ []  Follow-up with PCP Additional Notes: Patient reports he has an open sore on his leg since the beginning of the month. Patient was Nadeen Landau seen at [facility] and given antibiotics and a cream for the wound, but states it only helped for a few days and the redness and odor are returning. Per protocol, this RN scheduled in office appt today 1/27. Patient advised to call back with worsening symptoms. Patient verbalized understanding.      Copied from CRM [id number]. Topic: Clinical - Red Word Triage >> Mar 26, 2023  8:28 AM Gaetano Hawthorne wrote: [facility] that prompted transfer to Nurse Triage: Patient has a sore or wound that he has been caring for - he went to urgent care a few weeks ago for leakage and odor - he was prescribed an antibiotic and that took care of the odor. He's calling back because the odor is back and is concerned that he may need further treatment. Reason for Disposition  [1] Using antibiotic ointment > 1 week AND [2] sore not completely healed  Answer Assessment - Initial Assessment Questions 1. APPEARANCE of SORES: "What do the sores look like?"     leg, redness, open 2. NUMBER: "How many sores are there?"     1 3. SIZE: "How big is the largest sore?"     dime 4. LOCATION: "Where are the sores located?"     leg 5. ONSET: "When did the sores begin?"     Beginning of January, was given antibiotics & using ointment, went away and is coming back 6. TENDER: "Does it hurt when you touch it?"  (Scale 1-10; or mild, moderate, severe)      moderate 7. CAUSE: "What do you think is causing the sores?"     I had a wood  chip stuck in my shoulder since October and it got infected 8. OTHER SYMPTOMS: "Do you have any other symptoms?" (e.g., fever, new weakness)     Odor, redness  Protocols used: Sores-A-AH

## 2023-03-27 ENCOUNTER — Other Ambulatory Visit: Payer: Self-pay | Admitting: Emergency Medicine

## 2023-03-27 NOTE — Telephone Encounter (Signed)
Pt is requesting refill for valsartan. Medication is not mentioned on lov note. Please advise Dr. Delton Coombes

## 2023-03-27 NOTE — Telephone Encounter (Deleted)
Pt is requesting refill for valsartan. Medication is not mentioned on lov note. Please advise Dr. Delton Coombes

## 2023-03-30 DIAGNOSIS — G4733 Obstructive sleep apnea (adult) (pediatric): Secondary | ICD-10-CM | POA: Diagnosis not present

## 2023-04-13 DIAGNOSIS — J449 Chronic obstructive pulmonary disease, unspecified: Secondary | ICD-10-CM | POA: Diagnosis not present

## 2023-04-16 DIAGNOSIS — G4733 Obstructive sleep apnea (adult) (pediatric): Secondary | ICD-10-CM | POA: Diagnosis not present

## 2023-04-24 ENCOUNTER — Other Ambulatory Visit: Payer: Self-pay | Admitting: Emergency Medicine

## 2023-04-27 DIAGNOSIS — G4733 Obstructive sleep apnea (adult) (pediatric): Secondary | ICD-10-CM | POA: Diagnosis not present

## 2023-05-02 ENCOUNTER — Inpatient Hospital Stay: Payer: 59 | Admitting: Pulmonary Disease

## 2023-05-11 DIAGNOSIS — J449 Chronic obstructive pulmonary disease, unspecified: Secondary | ICD-10-CM | POA: Diagnosis not present

## 2023-05-14 DIAGNOSIS — G4733 Obstructive sleep apnea (adult) (pediatric): Secondary | ICD-10-CM | POA: Diagnosis not present

## 2023-05-28 DIAGNOSIS — G4733 Obstructive sleep apnea (adult) (pediatric): Secondary | ICD-10-CM | POA: Diagnosis not present

## 2023-06-01 ENCOUNTER — Other Ambulatory Visit: Payer: Self-pay | Admitting: Emergency Medicine

## 2023-06-11 DIAGNOSIS — J449 Chronic obstructive pulmonary disease, unspecified: Secondary | ICD-10-CM | POA: Diagnosis not present

## 2023-06-14 DIAGNOSIS — G4733 Obstructive sleep apnea (adult) (pediatric): Secondary | ICD-10-CM | POA: Diagnosis not present

## 2023-06-21 ENCOUNTER — Telehealth: Payer: Self-pay | Admitting: *Deleted

## 2023-06-21 ENCOUNTER — Ambulatory Visit: Admitting: Adult Health

## 2023-06-21 VITALS — BP 100/70 | HR 78 | Temp 98.0°F | Ht 73.0 in | Wt 326.0 lb

## 2023-06-21 DIAGNOSIS — E66813 Obesity, class 3: Secondary | ICD-10-CM

## 2023-06-21 DIAGNOSIS — Z6841 Body Mass Index (BMI) 40.0 and over, adult: Secondary | ICD-10-CM

## 2023-06-21 DIAGNOSIS — J449 Chronic obstructive pulmonary disease, unspecified: Secondary | ICD-10-CM | POA: Diagnosis not present

## 2023-06-21 MED ORDER — TIRZEPATIDE-WEIGHT MANAGEMENT 2.5 MG/0.5ML ~~LOC~~ SOLN: 2.5000 mg | SUBCUTANEOUS | 0 refills | Status: DC

## 2023-06-21 NOTE — Telephone Encounter (Signed)
 fyi

## 2023-06-21 NOTE — Progress Notes (Signed)
 Subjective:    Patient ID: Billy [name], male    DOB: 10-17-1970, 53 y.o.   MRN: [id number]  HPI 53 year old male who  has a past medical history of Alcohol use, Allergy, COPD (chronic obstructive pulmonary disease) (HCC), Drug use, Edema of both lower extremities, History of bronchitis, Hypertension, Obesity, Oxygen  deficiency, PFO (patent foramen ovale), Polycythemia, PVD (peripheral vascular disease) (HCC), Sleep apnea, SOB (shortness of breath), Substance abuse (HCC), and Tobacco abuse.  He presents to the office today for help with weight loss. He has a significant medical history of of sleep apnea, morbid obesity and COPD. He has had a hard time over the years with weight loss, despite trying various diets. He does exercise by walking but finds it hard to do much distance walking due to DOE from COPD. He has a hard time wearing his CPAP at times. He is interested in trying Zepbound  to aid in his OSA and obesity.   Wt Readings from Last 3 Encounters:  06/21/23 (!) 326 lb (147.9 kg)  03/26/23 (!) 328 lb (148.8 kg)  03/22/23 (!) 329 lb 3.2 oz (149.3 kg)     Review of Systems See HPI   Past Medical History:  Diagnosis Date   Alcohol use    Allergy    as a child- none as adult    COPD (chronic obstructive pulmonary disease) (HCC)    Drug use    Edema of both lower extremities    History of bronchitis    Hypertension    Obesity    Oxygen  deficiency    Pt uses 02  3 liters with his CPAP ONLY    PFO (patent foramen ovale)    Polycythemia    a. Dr. Maria Shiner.Aaron AasAaron AasJAK-2 analysis is negative.  b. likely physiologic secondary to chronic hypoxia (? OSA + COPD + obesity).  c. s/p plebotomy x1   PVD (peripheral vascular disease) (HCC)    Sleep apnea    a. mod severe by sleep study 9.30.11: AHI 44.8 per hour; oxygen  desat to a nadir of 65%; unsuccessful CPAP titration; significant hypoxemia even with supp o2 at 3LPM (dest to 70-80%); needs eval for cardiopulmonary disease and upper  airway obstruction   SOB (shortness of breath)    Substance abuse (HCC)    alcoholism    Tobacco abuse     Social History   Socioeconomic History   Marital status: Divorced    Spouse name: Not on file   Number of children: 2   Years of education: Not on file   Highest education level: Not on file  Occupational History   Occupation: unemployed   Occupation: Disabilty  Tobacco Use   Smoking status: Former    Current packs/day: 0.00    Average packs/day: 0.5 packs/day for 25.0 years (12.5 ttl pk-yrs)    Types: Cigarettes    Start date: 03/21/1993    Quit date: 03/21/2018    Years since quitting: 5.2   Smokeless tobacco: Never  Vaping Use   Vaping status: Former  Substance and Sexual Activity   Alcohol use: Yes    Alcohol/week: 76.0 standard drinks of alcohol    Types: 36 Cans of beer, 40 Shots of liquor per week   Drug use: Yes    Frequency: 1.0 times per week    Types: Marijuana    Comment: marajuana...also valium not prescribed   Sexual activity: Not Currently  Other Topics Concern   Not on file  Social History Narrative  05/01/2018:   Lives with mother on one level   Has two 18yo sons who live nearby   Enjoys hunting/fishing   Since being hospitalized for COPD exacerbation, has quit smoking, and is motivated to do aerobic exercise   Heavy alcohol consumer, does not see it as a problem, no interest in reducing alcohol intake.   Social Drivers of Corporate investment banker Strain: Low Risk  (01/18/2023)   Overall Financial Resource Strain (CARDIA)    Difficulty of Paying Living Expenses: Not very hard  Food Insecurity: No Food Insecurity (05/16/2022)   Hunger Vital Sign    Worried About Running Out of Food in the Last Year: Never true    Ran Out of Food in the Last Year: Never true  Transportation Needs: No Transportation Needs (01/18/2023)   PRAPARE - Administrator, Civil Service (Medical): No    Lack of Transportation (Non-Medical): No  Physical  Activity: Insufficiently Active (01/18/2023)   Exercise Vital Sign    Days of Exercise per Week: 3 days    Minutes of Exercise per Session: 30 min  Stress: No Stress Concern Present (01/18/2023)   [employer] of Occupational Health - Occupational Stress Questionnaire    Feeling of Stress : Not at all  Social Connections: Moderately Isolated (01/18/2023)   Social Connection and Isolation Panel [NHANES]    Frequency of Communication with Friends and Family: More than three times a week    Frequency of Social Gatherings with Friends and Family: More than three times a week    Attends Religious Services: 1 to 4 times per year    Active Member of [employer] or Organizations: No    Attends Banker Meetings: Never    Marital Status: Divorced  Catering manager Violence: Not At Risk (01/18/2023)   Humiliation, Afraid, Rape, and Kick questionnaire    Fear of Current or Ex-Partner: No    Emotionally Abused: No    Physically Abused: No    Sexually Abused: No    Past Surgical History:  Procedure Laterality Date   knee injury  1982   TONSILLECTOMY  1972   TYMPANOSTOMY TUBE PLACEMENT     as child     Family History  Problem Relation Age of Onset   Other Mother        Wegener's Disease   Thyroid  disease Mother    Alcoholism Mother    Eating disorder Mother    Obesity Mother    Drug abuse Father    Alcohol abuse Father    Asthma Father    Eating disorder Father    Obesity Father    Lung cancer Paternal Grandmother    Colon cancer Neg Hx    Colon polyps Neg Hx    Esophageal cancer Neg Hx    Rectal cancer Neg Hx    Stomach cancer Neg Hx     Allergies  Allergen Reactions   Ativan  [Lorazepam ] Other (See Comments)    Pt given Ativan .  Pt became unresponsive.    Current Outpatient Medications on File Prior to Visit  Medication Sig Dispense Refill   albuterol  (PROVENTIL ) (2.5 MG/3ML) 0.083% nebulizer solution Take 3 mLs (2.5 mg total) by nebulization every 4 (four)  hours as needed for wheezing or shortness of breath. 75 mL 1   albuterol  (VENTOLIN  HFA) 108 (90 Base) MCG/ACT inhaler Inhale 1-2 puffs into the lungs every 6 (six) hours as needed for wheezing or shortness of breath. 8.5 each 5  aspirin  81 MG tablet Take 2 tablets (161 mg total) by mouth daily. 30 tablet 0   cyanocobalamin  (VITAMIN B12) 1000 MCG tablet Take 1 tablet (1,000 mcg total) by mouth daily. 90 tablet 0   ergocalciferol  (VITAMIN D2) 1.25 MG (50000 UT) capsule Take 1 capsule (50,000 Units total) by mouth once a week. 12 capsule 0   mupirocin  ointment (BACTROBAN ) 2 % Apply 1 Application topically 2 (two) times daily. 22 g 0   SYMBICORT  160-4.5 MCG/ACT inhaler Inhale 2 puffs into the lungs daily. 10.2 each 11   Tiotropium Bromide  Monohydrate (SPIRIVA  RESPIMAT) 2.5 MCG/ACT AERS Inhale 1 puff into the lungs in the morning and at bedtime. 4 each 5   topiramate  (TOPAMAX ) 25 MG tablet Take 1 tablet (25 mg total) by mouth daily. 30 tablet 3   valsartan  (DIOVAN ) 160 MG tablet TAKE 1 TABLET BY MOUTH EVERY DAY 90 tablet 3   No current facility-administered medications on file prior to visit.    BP 100/70   Pulse 78   Temp 98 F (36.7 C) (Oral)   Ht 6\' 1"  (1.854 m)   Wt (!) 326 lb (147.9 kg)   SpO2 94%   BMI 43.01 kg/m       Objective:   Physical Exam Vitals and nursing note reviewed.  Constitutional:      Appearance: Normal appearance. He is obese.  Cardiovascular:     Rate and Rhythm: Normal rate and regular rhythm.     Pulses: Normal pulses.     Heart sounds: Normal heart sounds.  Pulmonary:     Effort: Pulmonary effort is normal.     Breath sounds: Normal breath sounds.  Skin:    General: Skin is warm and dry.  Neurological:     General: No focal deficit present.     Mental Status: He is alert and oriented to person, place, and time.  Psychiatric:        Mood and Affect: Mood normal.        Behavior: Behavior normal.        Thought Content: Thought content normal.         Judgment: Judgment normal.        Assessment & Plan:   1. Chronic obstructive pulmonary disease, unspecified COPD type (HCC) (Primary) - Will send in Zepbound to see if it is covered. If covered he can follow up in one month  - Work on reducing calories and exercise  - tirzepatide (ZEPBOUND) 2.5 MG/0.5ML injection vial; Inject 2.5 mg into the skin once a week.  Dispense: 2 mL; Refill: 0  2. Class 3 severe obesity due to excess calories with serious comorbidity and body mass index (BMI) of 45.0 to 49.9 in adult (HCC)  - tirzepatide (ZEPBOUND) 2.5 MG/0.5ML injection vial; Inject 2.5 mg into the skin once a week.  Dispense: 2 mL; Refill: 0   Alto Atta, NP  Time spent with patient today was 32 minutes which consisted of chart review, discussing obesity and sleep apnea, work up, treatment answering questions and documentation.

## 2023-06-21 NOTE — Telephone Encounter (Signed)
 Copied from CRM [id number]. Topic: Clinical - Prescription Issue >> Jun 21, 2023 11:53 AM Adonis Hoot wrote: Reason for CRM: Patient called to let provider know that zepbound was not approved through his insurance,and was advised to let provider know that he would have to discuss with insurance.

## 2023-06-22 ENCOUNTER — Other Ambulatory Visit: Payer: Self-pay | Admitting: Adult Health

## 2023-06-22 DIAGNOSIS — J449 Chronic obstructive pulmonary disease, unspecified: Secondary | ICD-10-CM

## 2023-06-27 DIAGNOSIS — G4733 Obstructive sleep apnea (adult) (pediatric): Secondary | ICD-10-CM | POA: Diagnosis not present

## 2023-06-27 NOTE — Telephone Encounter (Signed)
 Can someone assist with a PA please.

## 2023-06-28 ENCOUNTER — Telehealth: Payer: Self-pay

## 2023-06-28 ENCOUNTER — Other Ambulatory Visit (HOSPITAL_COMMUNITY): Payer: Self-pay

## 2023-06-28 NOTE — Telephone Encounter (Signed)
 Pharmacy Patient Advocate Encounter   Received notification from Pt Calls Messages that prior authorization for Zepbound  2.5MG /0.5ML pen-injectors is required/requested.   Insurance verification completed.   The patient is insured through [facility] .   Per test claim: PA required; PA submitted to above mentioned insurance via CoverMyMeds Key/confirmation #/EOC [facility] Status is pending

## 2023-06-28 NOTE — Telephone Encounter (Signed)
 PA request has been Submitted. New Encounter has been or will be created for follow up. For additional info see Pharmacy Prior Auth telephone encounter from 06/28/23.

## 2023-06-28 NOTE — Telephone Encounter (Signed)
 Pharmacy Patient Advocate Encounter  Received notification from [employer] that Prior Authorization for Zepbound  2.5MG /0.5ML pen-injectors  has been APPROVED from 06/28/23 to 02/27/24. Ran test claim, Copay is $0. This test claim was processed through [facility]- copay amounts may vary at other pharmacies due to pharmacy/plan contracts, or as the patient moves through the different stages of their insurance plan.   PA #/Case ID/Reference #: [id number]

## 2023-06-29 ENCOUNTER — Other Ambulatory Visit: Payer: Self-pay

## 2023-06-29 ENCOUNTER — Other Ambulatory Visit (HOSPITAL_COMMUNITY): Payer: Self-pay

## 2023-06-29 DIAGNOSIS — J449 Chronic obstructive pulmonary disease, unspecified: Secondary | ICD-10-CM

## 2023-06-29 DIAGNOSIS — E66813 Obesity, class 3: Secondary | ICD-10-CM

## 2023-06-29 MED ORDER — ZEPBOUND 2.5 MG/0.5ML ~~LOC~~ SOAJ
2.5000 mg | SUBCUTANEOUS | 0 refills | Status: DC
  Filled 2023-06-29 (×2): qty 2, 28d supply, fill #0

## 2023-06-29 NOTE — Telephone Encounter (Signed)
 Noted.

## 2023-06-29 NOTE — Telephone Encounter (Signed)
 Rx sent to [facility] pharmacy. Pt notified of update.

## 2023-07-11 DIAGNOSIS — J449 Chronic obstructive pulmonary disease, unspecified: Secondary | ICD-10-CM | POA: Diagnosis not present

## 2023-07-14 DIAGNOSIS — G4733 Obstructive sleep apnea (adult) (pediatric): Secondary | ICD-10-CM | POA: Diagnosis not present

## 2023-07-16 DIAGNOSIS — G4733 Obstructive sleep apnea (adult) (pediatric): Secondary | ICD-10-CM | POA: Diagnosis not present

## 2023-07-20 ENCOUNTER — Ambulatory Visit (INDEPENDENT_AMBULATORY_CARE_PROVIDER_SITE_OTHER): Admitting: Adult Health

## 2023-07-20 ENCOUNTER — Other Ambulatory Visit (HOSPITAL_COMMUNITY): Payer: Self-pay

## 2023-07-20 ENCOUNTER — Encounter: Payer: Self-pay | Admitting: Adult Health

## 2023-07-20 VITALS — BP 100/60 | HR 97 | Temp 98.0°F | Ht 73.0 in | Wt 323.0 lb

## 2023-07-20 DIAGNOSIS — J449 Chronic obstructive pulmonary disease, unspecified: Secondary | ICD-10-CM

## 2023-07-20 DIAGNOSIS — Z6841 Body Mass Index (BMI) 40.0 and over, adult: Secondary | ICD-10-CM

## 2023-07-20 DIAGNOSIS — G4733 Obstructive sleep apnea (adult) (pediatric): Secondary | ICD-10-CM

## 2023-07-20 DIAGNOSIS — E66813 Obesity, class 3: Secondary | ICD-10-CM | POA: Diagnosis not present

## 2023-07-20 MED ORDER — ZEPBOUND 5 MG/0.5ML ~~LOC~~ SOAJ
5.0000 mg | SUBCUTANEOUS | 0 refills | Status: DC
  Filled 2023-07-20: qty 2, 28d supply, fill #0

## 2023-07-20 MED ORDER — TIRZEPATIDE-WEIGHT MANAGEMENT 5 MG/0.5ML ~~LOC~~ SOLN
5.0000 mg | SUBCUTANEOUS | 0 refills | Status: DC
  Filled 2023-07-20: qty 2, 28d supply, fill #0

## 2023-07-20 NOTE — Progress Notes (Signed)
 Subjective:    Patient ID: Billy [name], male    DOB: 30-Sep-1970, 53 y.o.   MRN: [id number]  HPI 53 year old male who  has a past medical history of Alcohol use, Allergy, COPD (chronic obstructive pulmonary disease) (HCC), Drug use, Edema of both lower extremities, History of bronchitis, Hypertension, Obesity, Oxygen  deficiency, PFO (patent foramen ovale), Polycythemia, PVD (peripheral vascular disease) (HCC), Sleep apnea, SOB (shortness of breath), Substance abuse (HCC), and Tobacco abuse.  He presents to the office today for one month follow up regarding weight loss management. He also has a history of OSA.  He was seen a month ago he was started on Zepbound  to help with weight loss and OSA.  He was exercising but found it difficult to do much in the way of distance when walking due to dyspnea on exertion from COPD.  He was having a hard time also wearing his CPAP.  He was started on Zepbound  2.5 mg weekly and has been tolerating this medication well. He had constipation for the first few days after his initial shot. He has noticed that his appetite has decrease but has not curbed his alcohol consumption.   Wt Readings from Last 3 Encounters:  07/20/23 (!) 323 lb (146.5 kg)  06/21/23 (!) 326 lb (147.9 kg)  03/26/23 (!) 328 lb (148.8 kg)    Review of Systems See HPI   Past Medical History:  Diagnosis Date   Alcohol use    Allergy    as a child- none as adult    COPD (chronic obstructive pulmonary disease) (HCC)    Drug use    Edema of both lower extremities    History of bronchitis    Hypertension    Obesity    Oxygen  deficiency    Pt uses 02  3 liters with his CPAP ONLY    PFO (patent foramen ovale)    Polycythemia    a. Dr. Maria Shiner.Aaron AasAaron AasJAK-2 analysis is negative.  b. likely physiologic secondary to chronic hypoxia (? OSA + COPD + obesity).  c. s/p plebotomy x1   PVD (peripheral vascular disease) (HCC)    Sleep apnea    a. mod severe by sleep study 9.30.11: AHI 44.8  per hour; oxygen  desat to a nadir of 65%; unsuccessful CPAP titration; significant hypoxemia even with supp o2 at 3LPM (dest to 70-80%); needs eval for cardiopulmonary disease and upper airway obstruction   SOB (shortness of breath)    Substance abuse (HCC)    alcoholism    Tobacco abuse     Social History   Socioeconomic History   Marital status: Divorced    Spouse name: Not on file   Number of children: 2   Years of education: Not on file   Highest education level: Not on file  Occupational History   Occupation: unemployed   Occupation: Disabilty  Tobacco Use   Smoking status: Former    Current packs/day: 0.00    Average packs/day: 0.5 packs/day for 25.0 years (12.5 ttl pk-yrs)    Types: Cigarettes    Start date: 03/21/1993    Quit date: 03/21/2018    Years since quitting: 5.3   Smokeless tobacco: Never  Vaping Use   Vaping status: Former  Substance and Sexual Activity   Alcohol use: Yes    Alcohol/week: 76.0 standard drinks of alcohol    Types: 36 Cans of beer, 40 Shots of liquor per week   Drug use: Yes    Frequency: 1.0 times per  week    Types: Marijuana    Comment: marajuana...also valium not prescribed   Sexual activity: Not Currently  Other Topics Concern   Not on file  Social History Narrative   05/01/2018:   Lives with mother on one level   Has two 18yo sons who live nearby   Enjoys hunting/fishing   Since being hospitalized for COPD exacerbation, has quit smoking, and is motivated to do aerobic exercise   Heavy alcohol consumer, does not see it as a problem, no interest in reducing alcohol intake.   Social Drivers of Corporate investment banker Strain: Low Risk  (01/18/2023)   Overall Financial Resource Strain (CARDIA)    Difficulty of Paying Living Expenses: Not very hard  Food Insecurity: No Food Insecurity (05/16/2022)   Hunger Vital Sign    Worried About Running Out of Food in the Last Year: Never true    Ran Out of Food in the Last Year: Never true   Transportation Needs: No Transportation Needs (01/18/2023)   PRAPARE - Administrator, Civil Service (Medical): No    Lack of Transportation (Non-Medical): No  Physical Activity: Insufficiently Active (01/18/2023)   Exercise Vital Sign    Days of Exercise per Week: 3 days    Minutes of Exercise per Session: 30 min  Stress: No Stress Concern Present (01/18/2023)   [employer] of Occupational Health - Occupational Stress Questionnaire    Feeling of Stress : Not at all  Social Connections: Moderately Isolated (01/18/2023)   Social Connection and Isolation Panel [NHANES]    Frequency of Communication with Friends and Family: More than three times a week    Frequency of Social Gatherings with Friends and Family: More than three times a week    Attends Religious Services: 1 to 4 times per year    Active Member of [employer] or Organizations: No    Attends Banker Meetings: Never    Marital Status: Divorced  Catering manager Violence: Not At Risk (01/18/2023)   Humiliation, Afraid, Rape, and Kick questionnaire    Fear of Current or Ex-Partner: No    Emotionally Abused: No    Physically Abused: No    Sexually Abused: No    Past Surgical History:  Procedure Laterality Date   knee injury  1982   TONSILLECTOMY  1972   TYMPANOSTOMY TUBE PLACEMENT     as child     Family History  Problem Relation Age of Onset   Other Mother        Wegener's Disease   Thyroid  disease Mother    Alcoholism Mother    Eating disorder Mother    Obesity Mother    Drug abuse Father    Alcohol abuse Father    Asthma Father    Eating disorder Father    Obesity Father    Lung cancer Paternal Grandmother    Colon cancer Neg Hx    Colon polyps Neg Hx    Esophageal cancer Neg Hx    Rectal cancer Neg Hx    Stomach cancer Neg Hx     Allergies  Allergen Reactions   Ativan  [Lorazepam ] Other (See Comments)    Pt given Ativan .  Pt became unresponsive.    Current Outpatient  Medications on File Prior to Visit  Medication Sig Dispense Refill   albuterol  (PROVENTIL ) (2.5 MG/3ML) 0.083% nebulizer solution Take 3 mLs (2.5 mg total) by nebulization every 4 (four) hours as needed for wheezing or shortness of breath.  75 mL 1   albuterol  (VENTOLIN  HFA) 108 (90 Base) MCG/ACT inhaler Inhale 1-2 puffs into the lungs every 6 (six) hours as needed for wheezing or shortness of breath. 8.5 each 5   aspirin  81 MG tablet Take 2 tablets (161 mg total) by mouth daily. 30 tablet 0   SYMBICORT  160-4.5 MCG/ACT inhaler Inhale 2 puffs into the lungs daily. 10.2 each 11   Tiotropium Bromide  Monohydrate (SPIRIVA  RESPIMAT) 2.5 MCG/ACT AERS Inhale 1 puff into the lungs in the morning and at bedtime. 4 each 5   tirzepatide  (ZEPBOUND ) 2.5 MG/0.5ML Pen Inject 2.5 mg into the skin once a week. 2 mL 0   valsartan  (DIOVAN ) 160 MG tablet TAKE 1 TABLET BY MOUTH EVERY DAY 90 tablet 3   No current facility-administered medications on file prior to visit.    BP 100/60   Pulse 97   Temp 98 F (36.7 C) (Oral)   Ht 6\' 1"  (1.854 m)   Wt (!) 323 lb (146.5 kg)   SpO2 94%   BMI 42.61 kg/m       Objective:   Physical Exam Vitals and nursing note reviewed.  Constitutional:      Appearance: Normal appearance. He is obese.  Cardiovascular:     Rate and Rhythm: Normal rate and regular rhythm.     Pulses: Normal pulses.     Heart sounds: Normal heart sounds.  Pulmonary:     Effort: Pulmonary effort is normal.     Breath sounds: Normal breath sounds.  Skin:    General: Skin is warm and dry.  Neurological:     General: No focal deficit present.     Mental Status: He is alert and oriented to person, place, and time.  Psychiatric:        Mood and Affect: Mood normal.        Behavior: Behavior normal.        Thought Content: Thought content normal.        Judgment: Judgment normal.        Assessment & Plan:  1. Chronic obstructive pulmonary disease, unspecified COPD type (HCC) (Primary) -  Will increase Zepbound  to 5 mg weekly  - Follow up in 30 days  - tirzepatide  (ZEPBOUND ) 5 MG/0.5ML Pen; Inject 5 mg into the skin once a week.  Dispense: 2 mL; Refill: 0  2. Class 3 severe obesity due to excess calories with serious comorbidity and body mass index (BMI) of 45.0 to 49.9 in adult  - tirzepatide  (ZEPBOUND ) 5 MG/0.5ML Pen; Inject 5 mg into the skin once a week.  Dispense: 2 mL; Refill: 0  3. Obstructive sleep apnea  - tirzepatide  (ZEPBOUND ) 5 MG/0.5ML Pen; Inject 5 mg into the skin once a week.  Dispense: 2 mL; Refill: 0  4. BMI 40.0-44.9, adult (HCC)  - tirzepatide  (ZEPBOUND ) 5 MG/0.5ML Pen; Inject 5 mg into the skin once a week.  Dispense: 2 mL; Refill: 0  Alto Atta, NP

## 2023-07-28 DIAGNOSIS — G4733 Obstructive sleep apnea (adult) (pediatric): Secondary | ICD-10-CM | POA: Diagnosis not present

## 2023-08-11 DIAGNOSIS — J449 Chronic obstructive pulmonary disease, unspecified: Secondary | ICD-10-CM | POA: Diagnosis not present

## 2023-08-17 ENCOUNTER — Encounter: Payer: Self-pay | Admitting: Adult Health

## 2023-08-17 ENCOUNTER — Other Ambulatory Visit (HOSPITAL_COMMUNITY): Payer: Self-pay

## 2023-08-17 ENCOUNTER — Ambulatory Visit: Admitting: Adult Health

## 2023-08-17 ENCOUNTER — Other Ambulatory Visit: Payer: Self-pay | Admitting: Adult Health

## 2023-08-17 ENCOUNTER — Telehealth: Payer: Self-pay

## 2023-08-17 VITALS — BP 120/58 | HR 85 | Temp 98.4°F | Ht 73.0 in | Wt 319.2 lb

## 2023-08-17 DIAGNOSIS — G4733 Obstructive sleep apnea (adult) (pediatric): Secondary | ICD-10-CM

## 2023-08-17 DIAGNOSIS — Z6841 Body Mass Index (BMI) 40.0 and over, adult: Secondary | ICD-10-CM

## 2023-08-17 DIAGNOSIS — E66813 Obesity, class 3: Secondary | ICD-10-CM

## 2023-08-17 MED ORDER — ZEPBOUND 7.5 MG/0.5ML ~~LOC~~ SOAJ
7.5000 mg | SUBCUTANEOUS | 0 refills | Status: AC
  Filled 2023-08-17: qty 2, 28d supply, fill #0

## 2023-08-17 MED ORDER — TIRZEPATIDE 7.5 MG/0.5ML ~~LOC~~ SOAJ
7.5000 mg | SUBCUTANEOUS | 0 refills | Status: DC
  Filled 2023-08-17: qty 2, 28d supply, fill #0

## 2023-08-17 NOTE — Progress Notes (Addendum)
 Subjective:    Patient ID: Billy [name], male    DOB: 12/28/1970, 53 y.o.   MRN: [id number]  HPI 53 year old male who  has a past medical history of Alcohol use, Allergy, COPD (chronic obstructive pulmonary disease) (HCC), Drug use, Edema of both lower extremities, History of bronchitis, Hypertension, Obesity, Oxygen  deficiency, PFO (patent foramen ovale), Polycythemia, PVD (peripheral vascular disease) (HCC), Sleep apnea, SOB (shortness of breath), Substance abuse (HCC), and Tobacco abuse.  He presents to the office today for one month follow up regarding weight loss management. He also has a history of OSA.  He was  started on Zepbound  two months ago to help with weight loss and OSA.  He was exercising but found it difficult to do much in the way of distance when walking due to dyspnea on exertion from COPD.  He was having a hard time also wearing his CPAP.  He is currently on Zepbound  5 mg weekly. He has had some mild constipation and is using stool softeners PRN and fiber supplements. He is staying hydrated.   He has noticed that his appetite has decrease but has not curbed his alcohol consumption.   Wt Readings from Last 3 Encounters:  08/17/23 (!) 319 lb 3.2 oz (144.8 kg)  07/20/23 (!) 323 lb (146.5 kg)  06/21/23 (!) 326 lb (147.9 kg)    Review of Systems See HPI   Past Medical History:  Diagnosis Date   Alcohol use    Allergy    as a child- none as adult    COPD (chronic obstructive pulmonary disease) (HCC)    Drug use    Edema of both lower extremities    History of bronchitis    Hypertension    Obesity    Oxygen  deficiency    Pt uses 02  3 liters with his CPAP ONLY    PFO (patent foramen ovale)    Polycythemia    a. Dr. Maria Shiner.Aaron AasAaron AasJAK-2 analysis is negative.  b. likely physiologic secondary to chronic hypoxia (? OSA + COPD + obesity).  c. s/p plebotomy x1   PVD (peripheral vascular disease) (HCC)    Sleep apnea    a. mod severe by sleep study 9.30.11: AHI 44.8  per hour; oxygen  desat to a nadir of 65%; unsuccessful CPAP titration; significant hypoxemia even with supp o2 at 3LPM (dest to 70-80%); needs eval for cardiopulmonary disease and upper airway obstruction   SOB (shortness of breath)    Substance abuse (HCC)    alcoholism    Tobacco abuse     Social History   Socioeconomic History   Marital status: Divorced    Spouse name: Not on file   Number of children: 2   Years of education: Not on file   Highest education level: Not on file  Occupational History   Occupation: unemployed   Occupation: Disabilty  Tobacco Use   Smoking status: Former    Current packs/day: 0.00    Average packs/day: 0.5 packs/day for 25.0 years (12.5 ttl pk-yrs)    Types: Cigarettes    Start date: 03/21/1993    Quit date: 03/21/2018    Years since quitting: 5.4   Smokeless tobacco: Never  Vaping Use   Vaping status: Former  Substance and Sexual Activity   Alcohol use: Yes    Alcohol/week: 76.0 standard drinks of alcohol    Types: 36 Cans of beer, 40 Shots of liquor per week   Drug use: Yes    Frequency: 1.0  times per week    Types: Marijuana    Comment: marajuana...also valium not prescribed   Sexual activity: Not Currently  Other Topics Concern   Not on file  Social History Narrative   05/01/2018:   Lives with mother on one level   Has two 18yo sons who live nearby   Enjoys hunting/fishing   Since being hospitalized for COPD exacerbation, has quit smoking, and is motivated to do aerobic exercise   Heavy alcohol consumer, does not see it as a problem, no interest in reducing alcohol intake.   Social Drivers of Corporate investment banker Strain: Low Risk  (01/18/2023)   Overall Financial Resource Strain (CARDIA)    Difficulty of Paying Living Expenses: Not very hard  Food Insecurity: No Food Insecurity (05/16/2022)   Hunger Vital Sign    Worried About Running Out of Food in the Last Year: Never true    Ran Out of Food in the Last Year: Never true   Transportation Needs: No Transportation Needs (01/18/2023)   PRAPARE - Administrator, Civil Service (Medical): No    Lack of Transportation (Non-Medical): No  Physical Activity: Insufficiently Active (01/18/2023)   Exercise Vital Sign    Days of Exercise per Week: 3 days    Minutes of Exercise per Session: 30 min  Stress: No Stress Concern Present (01/18/2023)   [employer] of Occupational Health - Occupational Stress Questionnaire    Feeling of Stress : Not at all  Social Connections: Moderately Isolated (01/18/2023)   Social Connection and Isolation Panel    Frequency of Communication with Friends and Family: More than three times a week    Frequency of Social Gatherings with Friends and Family: More than three times a week    Attends Religious Services: 1 to 4 times per year    Active Member of [employer] or Organizations: No    Attends Banker Meetings: Never    Marital Status: Divorced  Catering manager Violence: Not At Risk (01/18/2023)   Humiliation, Afraid, Rape, and Kick questionnaire    Fear of Current or Ex-Partner: No    Emotionally Abused: No    Physically Abused: No    Sexually Abused: No    Past Surgical History:  Procedure Laterality Date   knee injury  1982   TONSILLECTOMY  1972   TYMPANOSTOMY TUBE PLACEMENT     as child     Family History  Problem Relation Age of Onset   Other Mother        Wegener's Disease   Thyroid  disease Mother    Alcoholism Mother    Eating disorder Mother    Obesity Mother    Drug abuse Father    Alcohol abuse Father    Asthma Father    Eating disorder Father    Obesity Father    Lung cancer Paternal Grandmother    Colon cancer Neg Hx    Colon polyps Neg Hx    Esophageal cancer Neg Hx    Rectal cancer Neg Hx    Stomach cancer Neg Hx     Allergies  Allergen Reactions   Ativan  [Lorazepam ] Other (See Comments)    Pt given Ativan .  Pt became unresponsive.    Current Outpatient Medications  on File Prior to Visit  Medication Sig Dispense Refill   albuterol  (PROVENTIL ) (2.5 MG/3ML) 0.083% nebulizer solution Take 3 mLs (2.5 mg total) by nebulization every 4 (four) hours as needed for wheezing or shortness of  breath. 75 mL 1   albuterol  (VENTOLIN  HFA) 108 (90 Base) MCG/ACT inhaler Inhale 1-2 puffs into the lungs every 6 (six) hours as needed for wheezing or shortness of breath. 8.5 each 5   aspirin  81 MG tablet Take 2 tablets (161 mg total) by mouth daily. 30 tablet 0   SYMBICORT  160-4.5 MCG/ACT inhaler Inhale 2 puffs into the lungs daily. 10.2 each 11   Tiotropium Bromide  Monohydrate (SPIRIVA  RESPIMAT) 2.5 MCG/ACT AERS Inhale 1 puff into the lungs in the morning and at bedtime. 4 each 5   tirzepatide  (ZEPBOUND ) 5 MG/0.5ML Pen Inject 5 mg into the skin once a week. 2 mL 0   valsartan  (DIOVAN ) 160 MG tablet TAKE 1 TABLET BY MOUTH EVERY DAY 90 tablet 3   No current facility-administered medications on file prior to visit.    BP (!) 120/58 (BP Location: Left Arm, Patient Position: Sitting, Cuff Size: Large)   Pulse 85   Temp 98.4 F (36.9 C) (Oral)   Ht 6' 1 (1.854 m)   Wt (!) 319 lb 3.2 oz (144.8 kg)   SpO2 93%   BMI 42.11 kg/m       Objective:   Physical Exam Vitals and nursing note reviewed.  Constitutional:      General: He is not in acute distress.    Appearance: Normal appearance. He is not ill-appearing.  HENT:     Head: Normocephalic and atraumatic.     Right Ear: Tympanic membrane, ear canal and external ear normal. There is no impacted cerumen.     Left Ear: Tympanic membrane, ear canal and external ear normal. There is no impacted cerumen.     Nose: Nose normal. No congestion or rhinorrhea.     Mouth/Throat:     Mouth: Mucous membranes are moist.     Pharynx: Oropharynx is clear.   Eyes:     Extraocular Movements: Extraocular movements intact.     Conjunctiva/sclera: Conjunctivae normal.     Pupils: Pupils are equal, round, and reactive to light.    Neck:     Vascular: No carotid bruit.   Cardiovascular:     Rate and Rhythm: Normal rate and regular rhythm.     Pulses: Normal pulses.     Heart sounds: No murmur heard.    No friction rub. No gallop.  Pulmonary:     Effort: Pulmonary effort is normal.     Breath sounds: Normal breath sounds.  Abdominal:     General: Abdomen is flat. Bowel sounds are normal. There is no distension.     Palpations: Abdomen is soft. There is no mass.     Tenderness: There is no abdominal tenderness. There is no guarding or rebound.     Hernia: No hernia is present.   Musculoskeletal:        General: Normal range of motion.     Cervical back: Normal range of motion and neck supple.  Lymphadenopathy:     Cervical: No cervical adenopathy.   Skin:    General: Skin is warm and dry.     Capillary Refill: Capillary refill takes less than 2 seconds.   Neurological:     General: No focal deficit present.     Mental Status: He is alert and oriented to person, place, and time.   Psychiatric:        Mood and Affect: Mood normal.        Behavior: Behavior normal.        Thought Content: Thought  content normal.        Judgment: Judgment normal.        Assessment & Plan:  1. Class 3 severe obesity due to excess calories with serious comorbidity and body mass index (BMI) of 45.0 to 49.9 in adult (Primary) - Will increase Mounjaro  to 7.5 mg weekly  - Follow up in 30 days  - tirzepatide  (ZEPBOUND ) 7.5 MG/0.5ML Pen; Inject 7.5 mg into the skin once a week.  Dispense: 2 mL; Refill: 0  2. Obstructive sleep apnea  - tirzepatide  (ZEPBOUND ) 7.5 MG/0.5ML Pen; Inject 7.5 mg into the skin once a week.  Dispense: 2 mL; Refill: 0  3. BMI 40.0-44.9, adult (HCC)  - tirzepatide  (ZEPBOUND ) 7.5 MG/0.5ML Pen; Inject 7.5 mg into the skin once a week.  Dispense: 2 mL; Refill: 0  Alto Atta, NP

## 2023-08-17 NOTE — Addendum Note (Signed)
 Addended by: Reford Canterbury on: 08/17/2023 02:37 PM   Modules accepted: Orders

## 2023-08-17 NOTE — Telephone Encounter (Signed)
 Ozempic/Mounjaro  is approved exclusively as an adjunct to diet and exercise to improve glycemic control in adults with type 2 diabetes mellitus. A review of patient's medical chart reveals no documented diagnosis of type 2 diabetes or an A1C indicative of diabetes. Therefore, they do not currently meet the criteria for prior authorization of this medication. If clinically appropriate, alternative options such as Saxenda, Zepbound , or Frederik Jansky may be considered for this patient.  Key: Davene Ernst

## 2023-08-17 NOTE — Telephone Encounter (Signed)
 Patient notified of update  and verbalized understanding.

## 2023-08-17 NOTE — Patient Instructions (Signed)
 Health Maintenance Due  Topic Date Due   COVID-19 Vaccine (1) Never done   DTaP/Tdap/Td (1 - Tdap) Never done   Pneumococcal Vaccine 20-54 Years old (1 of 2 - PCV) Never done   Zoster Vaccines- Shingrix (1 of 2) Never done       01/18/2023   11:51 AM 04/10/2022    2:51 PM 12/27/2021    1:26 PM  Depression screen PHQ 2/9  Decreased Interest 0 0 0  Down, Depressed, Hopeless 0 0 0  PHQ - 2 Score 0 0 0  Altered sleeping 0 0   Tired, decreased energy 0 0   Change in appetite 0 0   Feeling bad or failure about yourself  0 0   Trouble concentrating 0 0   Moving slowly or fidgety/restless 0 0   Suicidal thoughts 0 0   PHQ-9 Score 0 0   Difficult doing work/chores Not difficult at all

## 2023-08-21 ENCOUNTER — Other Ambulatory Visit: Payer: Self-pay | Admitting: Emergency Medicine

## 2023-08-27 DIAGNOSIS — G4733 Obstructive sleep apnea (adult) (pediatric): Secondary | ICD-10-CM | POA: Diagnosis not present

## 2023-08-28 DIAGNOSIS — G4733 Obstructive sleep apnea (adult) (pediatric): Secondary | ICD-10-CM | POA: Diagnosis not present

## 2023-09-10 DIAGNOSIS — J449 Chronic obstructive pulmonary disease, unspecified: Secondary | ICD-10-CM | POA: Diagnosis not present

## 2023-09-12 ENCOUNTER — Other Ambulatory Visit (HOSPITAL_COMMUNITY): Payer: Self-pay

## 2023-09-12 NOTE — Telephone Encounter (Signed)
 Provider switched to Zepbound . I ran test claim $0 copay.

## 2023-09-13 ENCOUNTER — Ambulatory Visit: Admitting: Adult Health

## 2023-09-13 DIAGNOSIS — G4733 Obstructive sleep apnea (adult) (pediatric): Secondary | ICD-10-CM | POA: Diagnosis not present

## 2023-09-21 ENCOUNTER — Ambulatory Visit: Admitting: Adult Health

## 2023-09-21 ENCOUNTER — Other Ambulatory Visit (HOSPITAL_COMMUNITY): Payer: Self-pay

## 2023-09-21 VITALS — BP 102/60 | HR 63 | Temp 98.0°F | Wt 312.0 lb

## 2023-09-21 DIAGNOSIS — Z6841 Body Mass Index (BMI) 40.0 and over, adult: Secondary | ICD-10-CM

## 2023-09-21 DIAGNOSIS — E66813 Obesity, class 3: Secondary | ICD-10-CM | POA: Diagnosis not present

## 2023-09-21 DIAGNOSIS — G4733 Obstructive sleep apnea (adult) (pediatric): Secondary | ICD-10-CM

## 2023-09-21 MED ORDER — ZEPBOUND 7.5 MG/0.5ML ~~LOC~~ SOAJ
7.5000 mg | SUBCUTANEOUS | 0 refills | Status: DC
  Filled 2023-09-21: qty 2, 28d supply, fill #0

## 2023-09-21 NOTE — Progress Notes (Signed)
 Subjective:    Patient ID: Billy [name], male    DOB: 1971-01-13, 53 y.o.   MRN: [id number]  HPI 53 year old male who  has a past medical history of Alcohol use, Allergy, COPD (chronic obstructive pulmonary disease) (HCC), Drug use, Edema of both lower extremities, History of bronchitis, Hypertension, Obesity, Oxygen  deficiency, PFO (patent foramen ovale), Polycythemia, PVD (peripheral vascular disease) (HCC), Sleep apnea, SOB (shortness of breath), Substance abuse (HCC), and Tobacco abuse.  He presents to the office today for one month follow up regarding weight loss management. He also has a history of OSA.  He was  started on Zepbound  three months ago ( 06/21/2023) to help with weight loss and OSA.  He was exercising but found it difficult to do much in the way of distance when walking due to dyspnea on exertion from COPD.  He was having a hard time also wearing his CPAP.  He is currently on Zepbound  7.5 mg weekly. He has had some significant constipation and is using stool softeners PRN and fiber pills . He is staying hydrated.   He has noticed that his appetite has decreased  Wt Readings from Last 3 Encounters:  09/21/23 (!) 312 lb (141.5 kg)  08/17/23 (!) 319 lb 3.2 oz (144.8 kg)  07/20/23 (!) 323 lb (146.5 kg)   Review of Systems See HPI   Past Medical History:  Diagnosis Date   Alcohol use    Allergy    as a child- none as adult    COPD (chronic obstructive pulmonary disease) (HCC)    Drug use    Edema of both lower extremities    History of bronchitis    Hypertension    Obesity    Oxygen  deficiency    Pt uses 02  3 liters with his CPAP ONLY    PFO (patent foramen ovale)    Polycythemia    a. Dr. Timmy.SABRASABRAJAK-2 analysis is negative.  b. likely physiologic secondary to chronic hypoxia (? OSA + COPD + obesity).  c. s/p plebotomy x1   PVD (peripheral vascular disease) (HCC)    Sleep apnea    a. mod severe by sleep study 9.30.11: AHI 44.8 per hour; oxygen  desat to  a nadir of 65%; unsuccessful CPAP titration; significant hypoxemia even with supp o2 at 3LPM (dest to 70-80%); needs eval for cardiopulmonary disease and upper airway obstruction   SOB (shortness of breath)    Substance abuse (HCC)    alcoholism    Tobacco abuse     Social History   Socioeconomic History   Marital status: Divorced    Spouse name: Not on file   Number of children: 2   Years of education: Not on file   Highest education level: Not on file  Occupational History   Occupation: unemployed   Occupation: Disabilty  Tobacco Use   Smoking status: Former    Current packs/day: 0.00    Average packs/day: 0.5 packs/day for 25.0 years (12.5 ttl pk-yrs)    Types: Cigarettes    Start date: 03/21/1993    Quit date: 03/21/2018    Years since quitting: 5.5   Smokeless tobacco: Never  Vaping Use   Vaping status: Former  Substance and Sexual Activity   Alcohol use: Yes    Alcohol/week: 76.0 standard drinks of alcohol    Types: 36 Cans of beer, 40 Shots of liquor per week   Drug use: Yes    Frequency: 1.0 times per week  Types: Marijuana    Comment: marajuana...also valium not prescribed   Sexual activity: Not Currently  Other Topics Concern   Not on file  Social History Narrative   05/01/2018:   Lives with mother on one level   Has two 18yo sons who live nearby   Enjoys hunting/fishing   Since being hospitalized for COPD exacerbation, has quit smoking, and is motivated to do aerobic exercise   Heavy alcohol consumer, does not see it as a problem, no interest in reducing alcohol intake.   Social Drivers of Corporate investment banker Strain: Low Risk  (01/18/2023)   Overall Financial Resource Strain (CARDIA)    Difficulty of Paying Living Expenses: Not very hard  Food Insecurity: No Food Insecurity (05/16/2022)   Hunger Vital Sign    Worried About Running Out of Food in the Last Year: Never true    Ran Out of Food in the Last Year: Never true  Transportation Needs: No  Transportation Needs (01/18/2023)   PRAPARE - Administrator, Civil Service (Medical): No    Lack of Transportation (Non-Medical): No  Physical Activity: Insufficiently Active (01/18/2023)   Exercise Vital Sign    Days of Exercise per Week: 3 days    Minutes of Exercise per Session: 30 min  Stress: No Stress Concern Present (01/18/2023)   [employer] of Occupational Health - Occupational Stress Questionnaire    Feeling of Stress : Not at all  Social Connections: Moderately Isolated (01/18/2023)   Social Connection and Isolation Panel    Frequency of Communication with Friends and Family: More than three times a week    Frequency of Social Gatherings with Friends and Family: More than three times a week    Attends Religious Services: 1 to 4 times per year    Active Member of [employer] or Organizations: No    Attends Banker Meetings: Never    Marital Status: Divorced  Catering manager Violence: Not At Risk (01/18/2023)   Humiliation, Afraid, Rape, and Kick questionnaire    Fear of Current or Ex-Partner: No    Emotionally Abused: No    Physically Abused: No    Sexually Abused: No    Past Surgical History:  Procedure Laterality Date   knee injury  1982   TONSILLECTOMY  1972   TYMPANOSTOMY TUBE PLACEMENT     as child     Family History  Problem Relation Age of Onset   Other Mother        Wegener's Disease   Thyroid  disease Mother    Alcoholism Mother    Eating disorder Mother    Obesity Mother    Drug abuse Father    Alcohol abuse Father    Asthma Father    Eating disorder Father    Obesity Father    Lung cancer Paternal Grandmother    Colon cancer Neg Hx    Colon polyps Neg Hx    Esophageal cancer Neg Hx    Rectal cancer Neg Hx    Stomach cancer Neg Hx     Allergies  Allergen Reactions   Ativan  [Lorazepam ] Other (See Comments)    Pt given Ativan .  Pt became unresponsive.    Current Outpatient Medications on File Prior to Visit   Medication Sig Dispense Refill   albuterol  (PROVENTIL ) (2.5 MG/3ML) 0.083% nebulizer solution Take 3 mLs (2.5 mg total) by nebulization every 4 (four) hours as needed for wheezing or shortness of breath. 75 mL 1  albuterol  (VENTOLIN  HFA) 108 (90 Base) MCG/ACT inhaler Inhale 1-2 puffs into the lungs every 6 (six) hours as needed for wheezing or shortness of breath. 8.5 each 5   aspirin  81 MG tablet Take 2 tablets (161 mg total) by mouth daily. 30 tablet 0   SYMBICORT  160-4.5 MCG/ACT inhaler TAKE 2 PUFFS BY MOUTH TWICE A DAY 10.2 each 11   Tiotropium Bromide  Monohydrate (SPIRIVA  RESPIMAT) 2.5 MCG/ACT AERS Inhale 1 puff into the lungs in the morning and at bedtime. 4 each 5   valsartan  (DIOVAN ) 160 MG tablet TAKE 1 TABLET BY MOUTH EVERY DAY 90 tablet 3   No current facility-administered medications on file prior to visit.    BP 102/60   Pulse 63   Temp 98 F (36.7 C) (Oral)   Wt (!) 312 lb (141.5 kg)   SpO2 94%   BMI 41.16 kg/m       Objective:   Physical Exam Vitals and nursing note reviewed.  Constitutional:      Appearance: Normal appearance. He is obese.  Cardiovascular:     Rate and Rhythm: Normal rate and regular rhythm.     Pulses: Normal pulses.     Heart sounds: Normal heart sounds.  Pulmonary:     Effort: Pulmonary effort is normal.     Breath sounds: Normal breath sounds.  Skin:    General: Skin is warm and dry.  Neurological:     General: No focal deficit present.     Mental Status: He is alert and oriented to person, place, and time.  Psychiatric:        Mood and Affect: Mood normal.        Behavior: Behavior normal.        Thought Content: Thought content normal.        Judgment: Judgment normal.        Assessment & Plan:  1. Class 3 severe obesity due to excess calories with serious comorbidity and body mass index (BMI) of 45.0 to 49.9 in adult (Primary) - Will keep him on Zepbound  7.5 mg weekly. I will have him switch from fiber pills to Metamucil and  add Miralax BID until he has more frequent bowel movements and then just fiber daily. He can stop his stool softner.  - Follow up in one month  - tirzepatide  (ZEPBOUND ) 7.5 MG/0.5ML Pen; Inject 7.5 mg into the skin once a week.  Dispense: 2 mL; Refill: 0  2. Obstructive sleep apnea  - tirzepatide  (ZEPBOUND ) 7.5 MG/0.5ML Pen; Inject 7.5 mg into the skin once a week.  Dispense: 2 mL; Refill: 0  3. BMI 40.0-44.9, adult (HCC)  - tirzepatide  (ZEPBOUND ) 7.5 MG/0.5ML Pen; Inject 7.5 mg into the skin once a week.  Dispense: 2 mL; Refill: 0  Darleene Shape, NP

## 2023-09-21 NOTE — Patient Instructions (Signed)
 It was great seeing you today   I want you to use Miralax two times a day and Metamucil daily until you start having more frequent bowel movements and then switch to just metamucil. Continue to increase fiber through vegetables and fruit

## 2023-09-27 DIAGNOSIS — G4733 Obstructive sleep apnea (adult) (pediatric): Secondary | ICD-10-CM | POA: Diagnosis not present

## 2023-10-11 DIAGNOSIS — J449 Chronic obstructive pulmonary disease, unspecified: Secondary | ICD-10-CM | POA: Diagnosis not present

## 2023-10-14 DIAGNOSIS — G4733 Obstructive sleep apnea (adult) (pediatric): Secondary | ICD-10-CM | POA: Diagnosis not present

## 2023-10-16 DIAGNOSIS — G4733 Obstructive sleep apnea (adult) (pediatric): Secondary | ICD-10-CM | POA: Diagnosis not present

## 2023-10-17 ENCOUNTER — Other Ambulatory Visit: Payer: Self-pay | Admitting: Adult Health

## 2023-10-17 DIAGNOSIS — Z6841 Body Mass Index (BMI) 40.0 and over, adult: Secondary | ICD-10-CM

## 2023-10-17 DIAGNOSIS — G4733 Obstructive sleep apnea (adult) (pediatric): Secondary | ICD-10-CM

## 2023-10-17 NOTE — Telephone Encounter (Signed)
 Copied from CRM #[id number]. Topic: Clinical - Medication Refill >> Oct 17, 2023  9:15 AM Pinkey ORN wrote: Medication: tirzepatide  (ZEPBOUND ) 7.5 MG/0.5ML Pen  Has the patient contacted their pharmacy? Yes (Agent: If no, request that the patient contact the pharmacy for the refill. If patient does not wish to contact the pharmacy document the reason why and proceed with request.) (Agent: If yes, when and what did the pharmacy advise?)  This is the patient's preferred pharmacy:  [facility] [address], [address] [place] [address] Phone: [phone] Fax: [phone]  Is this the correct pharmacy for this prescription? Yes If no, delete pharmacy and type the correct one.   Has the prescription been filled recently? No  Is the patient out of the medication? Yes  Has the patient been seen for an appointment in the last year OR does the patient have an upcoming appointment? Yes  Can we respond through MyChart? Yes  Agent: Please be advised that Rx refills may take up to 3 business days. We ask that you follow-up with your pharmacy.

## 2023-10-18 NOTE — Telephone Encounter (Signed)
 Ok to send in pt was supposed to follow up this week but scheduled for 11/09/2023. Please advise

## 2023-10-19 ENCOUNTER — Other Ambulatory Visit: Payer: Self-pay

## 2023-10-19 ENCOUNTER — Ambulatory Visit: Admitting: Adult Health

## 2023-10-19 ENCOUNTER — Other Ambulatory Visit (HOSPITAL_COMMUNITY): Payer: Self-pay

## 2023-10-19 MED ORDER — ZEPBOUND 7.5 MG/0.5ML ~~LOC~~ SOAJ
7.5000 mg | SUBCUTANEOUS | 0 refills | Status: DC
  Filled 2023-10-19 (×2): qty 2, 28d supply, fill #0

## 2023-10-22 ENCOUNTER — Other Ambulatory Visit: Payer: Self-pay

## 2023-10-24 ENCOUNTER — Other Ambulatory Visit: Payer: Self-pay | Admitting: Family Medicine

## 2023-10-28 DIAGNOSIS — G4733 Obstructive sleep apnea (adult) (pediatric): Secondary | ICD-10-CM | POA: Diagnosis not present

## 2023-11-02 ENCOUNTER — Other Ambulatory Visit: Payer: Self-pay | Admitting: Pulmonary Disease

## 2023-11-02 DIAGNOSIS — J449 Chronic obstructive pulmonary disease, unspecified: Secondary | ICD-10-CM

## 2023-11-09 ENCOUNTER — Encounter: Payer: Self-pay | Admitting: Adult Health

## 2023-11-09 ENCOUNTER — Ambulatory Visit: Admitting: Adult Health

## 2023-11-09 ENCOUNTER — Other Ambulatory Visit (HOSPITAL_COMMUNITY): Payer: Self-pay

## 2023-11-09 VITALS — BP 90/60 | HR 82 | Temp 97.6°F | Ht 73.0 in | Wt 308.0 lb

## 2023-11-09 DIAGNOSIS — I10 Essential (primary) hypertension: Secondary | ICD-10-CM

## 2023-11-09 DIAGNOSIS — Z6841 Body Mass Index (BMI) 40.0 and over, adult: Secondary | ICD-10-CM

## 2023-11-09 DIAGNOSIS — E66813 Obesity, class 3: Secondary | ICD-10-CM | POA: Diagnosis not present

## 2023-11-09 DIAGNOSIS — G4733 Obstructive sleep apnea (adult) (pediatric): Secondary | ICD-10-CM

## 2023-11-09 MED ORDER — TIRZEPATIDE-WEIGHT MANAGEMENT 10 MG/0.5ML ~~LOC~~ SOAJ
10.0000 mg | SUBCUTANEOUS | 0 refills | Status: AC
  Filled 2023-11-09: qty 2, 28d supply, fill #0
  Filled 2023-12-17: qty 2, 28d supply, fill #1
  Filled 2024-01-09: qty 2, 28d supply, fill #2

## 2023-11-09 NOTE — Progress Notes (Signed)
 Subjective:    Patient ID: Billy [name], male    DOB: 28-Sep-1970, 53 y.o.   MRN: [id number]  HPI  53 year old male who  has a past medical history of Alcohol use, Allergy, COPD (chronic obstructive pulmonary disease) (HCC), Drug use, Edema of both lower extremities, History of bronchitis, Hypertension, Obesity, Oxygen  deficiency, PFO (patent foramen ovale), Polycythemia, PVD (peripheral vascular disease) (HCC), Sleep apnea, SOB (shortness of breath), Substance abuse (HCC), and Tobacco abuse.  He presents to the office today for follow up regarding OSA/Obesity and HTN    He was  started on Zepbound  about five months ago ( 06/21/2023) to help with weight loss and OSA.  He was exercising but found it difficult to do much in the way of distance when walking due to dyspnea on exertion from COPD.  He was having a hard time also wearing his CPAP.  He is currently on Zepbound  7.5 mg weekly. He has had some significant constipation and started a fiber supplement which has helped tremendously.  Wt Readings from Last 5 Encounters:  11/09/23 (!) 308 lb (139.7 kg)  09/21/23 (!) 312 lb (141.5 kg)  08/17/23 (!) 319 lb 3.2 oz (144.8 kg)  07/20/23 (!) 323 lb (146.5 kg)  06/21/23 (!) 326 lb (147.9 kg)   HTN - managed with Valsartan  160 mg daily. He denies dizziness, lightheadedness, blurred vision.   BP Readings from Last 3 Encounters:  11/09/23 90/60  09/21/23 102/60  08/17/23 (!) 120/58    Review of Systems See HPI   Past Medical History:  Diagnosis Date   Alcohol use    Allergy    as a child- none as adult    COPD (chronic obstructive pulmonary disease) (HCC)    Drug use    Edema of both lower extremities    History of bronchitis    Hypertension    Obesity    Oxygen  deficiency    Pt uses 02  3 liters with his CPAP ONLY    PFO (patent foramen ovale)    Polycythemia    a. Dr. Timmy.SABRASABRAJAK-2 analysis is negative.  b. likely physiologic secondary to chronic hypoxia (? OSA + COPD  + obesity).  c. s/p plebotomy x1   PVD (peripheral vascular disease) (HCC)    Sleep apnea    a. mod severe by sleep study 9.30.11: AHI 44.8 per hour; oxygen  desat to a nadir of 65%; unsuccessful CPAP titration; significant hypoxemia even with supp o2 at 3LPM (dest to 70-80%); needs eval for cardiopulmonary disease and upper airway obstruction   SOB (shortness of breath)    Substance abuse (HCC)    alcoholism    Tobacco abuse     Social History   Socioeconomic History   Marital status: Divorced    Spouse name: Not on file   Number of children: 2   Years of education: Not on file   Highest education level: Not on file  Occupational History   Occupation: unemployed   Occupation: Disabilty  Tobacco Use   Smoking status: Former    Current packs/day: 0.00    Average packs/day: 0.5 packs/day for 25.0 years (12.5 ttl pk-yrs)    Types: Cigarettes    Start date: 03/21/1993    Quit date: 03/21/2018    Years since quitting: 5.6   Smokeless tobacco: Never  Vaping Use   Vaping status: Former  Substance and Sexual Activity   Alcohol use: Yes    Alcohol/week: 76.0 standard drinks of alcohol  Types: 36 Cans of beer, 40 Shots of liquor per week   Drug use: Yes    Frequency: 1.0 times per week    Types: Marijuana    Comment: marajuana...also valium not prescribed   Sexual activity: Not Currently  Other Topics Concern   Not on file  Social History Narrative   05/01/2018:   Lives with mother on one level   Has two 18yo sons who live nearby   Enjoys hunting/fishing   Since being hospitalized for COPD exacerbation, has quit smoking, and is motivated to do aerobic exercise   Heavy alcohol consumer, does not see it as a problem, no interest in reducing alcohol intake.   Social Drivers of Corporate investment banker Strain: Low Risk  (01/18/2023)   Overall Financial Resource Strain (CARDIA)    Difficulty of Paying Living Expenses: Not very hard  Food Insecurity: No Food Insecurity  (05/16/2022)   Hunger Vital Sign    Worried About Running Out of Food in the Last Year: Never true    Ran Out of Food in the Last Year: Never true  Transportation Needs: No Transportation Needs (01/18/2023)   PRAPARE - Administrator, Civil Service (Medical): No    Lack of Transportation (Non-Medical): No  Physical Activity: Insufficiently Active (01/18/2023)   Exercise Vital Sign    Days of Exercise per Week: 3 days    Minutes of Exercise per Session: 30 min  Stress: No Stress Concern Present (01/18/2023)   [employer] of Occupational Health - Occupational Stress Questionnaire    Feeling of Stress : Not at all  Social Connections: Moderately Isolated (01/18/2023)   Social Connection and Isolation Panel    Frequency of Communication with Friends and Family: More than three times a week    Frequency of Social Gatherings with Friends and Family: More than three times a week    Attends Religious Services: 1 to 4 times per year    Active Member of [employer] or Organizations: No    Attends Banker Meetings: Never    Marital Status: Divorced  Catering manager Violence: Not At Risk (01/18/2023)   Humiliation, Afraid, Rape, and Kick questionnaire    Fear of Current or Ex-Partner: No    Emotionally Abused: No    Physically Abused: No    Sexually Abused: No    Past Surgical History:  Procedure Laterality Date   knee injury  1982   TONSILLECTOMY  1972   TYMPANOSTOMY TUBE PLACEMENT     as child     Family History  Problem Relation Age of Onset   Other Mother        Wegener's Disease   Thyroid  disease Mother    Alcoholism Mother    Eating disorder Mother    Obesity Mother    Drug abuse Father    Alcohol abuse Father    Asthma Father    Eating disorder Father    Obesity Father    Lung cancer Paternal Grandmother    Colon cancer Neg Hx    Colon polyps Neg Hx    Esophageal cancer Neg Hx    Rectal cancer Neg Hx    Stomach cancer Neg Hx     Allergies   Allergen Reactions   Ativan  [Lorazepam ] Other (See Comments)    Pt given Ativan .  Pt became unresponsive.    Current Outpatient Medications on File Prior to Visit  Medication Sig Dispense Refill   albuterol  (PROVENTIL ) (2.5 MG/3ML) 0.083%  nebulizer solution Take 3 mLs (2.5 mg total) by nebulization every 4 (four) hours as needed for wheezing or shortness of breath. 75 mL 1   albuterol  (VENTOLIN  HFA) 108 (90 Base) MCG/ACT inhaler INHALE 1-2 PUFFS BY MOUTH EVERY 6 HOURS AS NEEDED FOR WHEEZE OR SHORTNESS OF BREATH 8.5 each 5   aspirin  81 MG tablet Take 2 tablets (161 mg total) by mouth daily. 30 tablet 0   SYMBICORT  160-4.5 MCG/ACT inhaler TAKE 2 PUFFS BY MOUTH TWICE A DAY 10.2 each 11   Tiotropium Bromide  Monohydrate (SPIRIVA  RESPIMAT) 2.5 MCG/ACT AERS Inhale 1 puff into the lungs in the morning and at bedtime. 4 each 5   tirzepatide  (ZEPBOUND ) 7.5 MG/0.5ML Pen Inject 7.5 mg into the skin once a week. 2 mL 0   valsartan  (DIOVAN ) 160 MG tablet TAKE 1 TABLET BY MOUTH EVERY DAY 90 tablet 3   No current facility-administered medications on file prior to visit.    BP 90/60   Pulse 82   Temp 97.6 F (36.4 C) (Oral)   Ht 6' 1 (1.854 m)   Wt (!) 308 lb (139.7 kg)   SpO2 92%   BMI 40.64 kg/m       Objective:   Physical Exam Vitals and nursing note reviewed.  Constitutional:      Appearance: Normal appearance. He is obese.  Cardiovascular:     Rate and Rhythm: Normal rate and regular rhythm.     Pulses: Normal pulses.     Heart sounds: Normal heart sounds.  Pulmonary:     Effort: Pulmonary effort is normal.     Breath sounds: Normal breath sounds.  Neurological:     General: No focal deficit present.     Mental Status: He is alert and oriented to person, place, and time.  Psychiatric:        Mood and Affect: Mood normal.        Behavior: Behavior normal.        Thought Content: Thought content normal.        Judgment: Judgment normal.       Assessment & Plan:  1. Class 3  severe obesity due to excess calories with serious comorbidity and body mass index (BMI) of 45.0 to 49.9 in adult (Primary) - He has lost 18 pounds. Will increase his dose to 10 mg weekly x 3 months  - Increase exercise. Continue to eat healthy  - tirzepatide  (ZEPBOUND ) 10 MG/0.5ML Pen; Inject 10 mg into the skin once a week.  Dispense: 6 mL; Refill: 0  2. Obstructive sleep apnea  - tirzepatide  (ZEPBOUND ) 10 MG/0.5ML Pen; Inject 10 mg into the skin once a week.  Dispense: 6 mL; Refill: 0  3. BMI 40.0-44.9, adult (HCC)  - tirzepatide  (ZEPBOUND ) 10 MG/0.5ML Pen; Inject 10 mg into the skin once a week.  Dispense: 6 mL; Refill: 0  4. Essential hypertension - Hypotensive today. Will decrease Diovan  to 80 mg  - monitor BP at home; return precautions reviewed   Darleene Shape, NP

## 2023-11-11 DIAGNOSIS — J449 Chronic obstructive pulmonary disease, unspecified: Secondary | ICD-10-CM | POA: Diagnosis not present

## 2023-11-28 ENCOUNTER — Telehealth: Payer: Self-pay

## 2023-11-28 NOTE — Telephone Encounter (Signed)
 Copied from CRM #[id number]. Topic: General - Other >> Nov 28, 2023  1:17 PM Pinkey ORN wrote: Reason for CRM: valsartan  (DIOVAN ) 160 MG tablet >> Nov 28, 2023  1:19 PM Pinkey ORN wrote: Patient states when taking the half dose of valsartan  (DIOVAN ) 160 MG tablet, he starting getting these extremely bad headaches that he couldn't get rid of. Patient wanted to let Merna Huxley, NP know that he's no longer doing the half dose, he's taking the full 160 tablet.

## 2023-12-10 ENCOUNTER — Other Ambulatory Visit: Payer: Self-pay | Admitting: Adult Health

## 2023-12-10 DIAGNOSIS — Z6841 Body Mass Index (BMI) 40.0 and over, adult: Secondary | ICD-10-CM

## 2023-12-10 DIAGNOSIS — G4733 Obstructive sleep apnea (adult) (pediatric): Secondary | ICD-10-CM

## 2023-12-10 NOTE — Telephone Encounter (Unsigned)
 Copied from CRM [id number]. Topic: Clinical - Medication Refill >> Dec 10, 2023  4:47 PM Dedra B wrote: Medication: tirzepatide  (ZEPBOUND ) 10 MG/0.5ML Pen  Has the patient contacted their pharmacy? No, calls office   This is the patient's preferred pharmacy:   [facility] [address] [place] [address] Phone: [phone] Fax: [phone]  Is this the correct pharmacy for this prescription? Yes   Has the prescription been filled recently? No  Is the patient out of the medication? Yes  Has the patient been seen for an appointment in the last year OR does the patient have an upcoming appointment? Yes  Can we respond through MyChart? Yes  Agent: Please be advised that Rx refills may take up to 3 business days. We ask that you follow-up with your pharmacy.

## 2023-12-11 DIAGNOSIS — J449 Chronic obstructive pulmonary disease, unspecified: Secondary | ICD-10-CM | POA: Diagnosis not present

## 2023-12-14 DIAGNOSIS — G4733 Obstructive sleep apnea (adult) (pediatric): Secondary | ICD-10-CM | POA: Diagnosis not present

## 2023-12-17 ENCOUNTER — Other Ambulatory Visit (HOSPITAL_COMMUNITY): Payer: Self-pay

## 2023-12-17 DIAGNOSIS — G4733 Obstructive sleep apnea (adult) (pediatric): Secondary | ICD-10-CM | POA: Diagnosis not present

## 2023-12-28 DIAGNOSIS — G4733 Obstructive sleep apnea (adult) (pediatric): Secondary | ICD-10-CM | POA: Diagnosis not present

## 2024-01-09 ENCOUNTER — Other Ambulatory Visit (HOSPITAL_COMMUNITY): Payer: Self-pay

## 2024-01-11 ENCOUNTER — Other Ambulatory Visit (HOSPITAL_COMMUNITY): Payer: Self-pay

## 2024-01-30 ENCOUNTER — Ambulatory Visit: Admitting: Emergency Medicine

## 2024-01-31 ENCOUNTER — Ambulatory Visit: Admitting: Family Medicine

## 2024-01-31 DIAGNOSIS — Z0000 Encounter for general adult medical examination without abnormal findings: Secondary | ICD-10-CM

## 2024-01-31 NOTE — Progress Notes (Signed)
 ----------------------------------------------------------------------------------------------------------------------------------------------------------------------------------------------------------------------  Because this visit was a virtual/telehealth visit, some criteria may be missing or patient reported. Any vitals not documented were not able to be obtained and vitals that have been documented are patient reported.    MEDICARE ANNUAL PREVENTIVE CARE VISIT WITH PROVIDER (Welcome to Medicare, initial annual wellness or annual wellness exam)  Virtual Visit via Video Note  I connected with Mamadou Breon on 01/31/24  by a video enabled telemedicine application and verified that I am speaking with the correct person using two identifiers.  Location patient: home Location provider:work or home office Persons participating in the virtual visit: patient, provider  Concerns and/or follow up today: detailed intake and health/risks assessment completed on flow sheets and below- please see for details. Stable.   How often do you have a drink containing alcohol?7 How many drinks containing alcohol do you have on a typical day when you are drinking? 8 beers, reports has cut back, has been doing this for a long time How often do you have six or more drinks on one occasion?daily Have you ever smoked?y Quit date if applicable? 6 years ago  How many packs a day do/did you smoke? 1.5 ppd for 30 days - sees pulm doctor Last dentist visit? Goes once a year, sometimes twice a year Last eye Exam and location? Doesn't go on a regular bases   See HM section in Epic for other details of completed HM.    ROS: negative for report of fevers, vision changes, vision loss, hearing loss or change, chest pain, sob, hemoptysis, melena, hematochezia, hematuria,bleeding or bruising  Patient-completed extensive health risk assessment - reviewed and discussed with the patient: See Health Risk Assessment  completed with patient prior to the visit either above or in recent phone note. This was reviewed in detailed with the patient today and appropriate recommendations, orders and referrals were placed as needed per Summary below and patient instructions.   Review of Medical History: -PMH, PSH, Family History and current specialty and care providers reviewed and updated and listed below   Patient Care Team: Merna Huxley, NP as PCP - General (Family Medicine) Shelah Lamar RAMAN, MD as Consulting Physician (Pulmonary Disease) Lionell, Jon DEL, [facility] (Pharmacist)   Past Medical History:  Diagnosis Date   Alcohol use    Allergy    as a child- none as adult    COPD (chronic obstructive pulmonary disease) (HCC)    Drug use    Edema of both lower extremities    History of bronchitis    Hypertension    Obesity    Oxygen  deficiency    Pt uses 02  3 liters with his CPAP ONLY    PFO (patent foramen ovale)    Polycythemia    a. Dr. Timmy.SABRASABRAJAK-2 analysis is negative.  b. likely physiologic secondary to chronic hypoxia (? OSA + COPD + obesity).  c. s/p plebotomy x1   PVD (peripheral vascular disease)    Sleep apnea    a. mod severe by sleep study 9.30.11: AHI 44.8 per hour; oxygen  desat to a nadir of 65%; unsuccessful CPAP titration; significant hypoxemia even with supp o2 at 3LPM (dest to 70-80%); needs eval for cardiopulmonary disease and upper airway obstruction   SOB (shortness of breath)    Substance abuse (HCC)    alcoholism    Tobacco abuse     Past Surgical History:  Procedure Laterality Date   knee injury  1982   TONSILLECTOMY  1972   TYMPANOSTOMY TUBE  PLACEMENT     as child     Social History   Socioeconomic History   Marital status: Divorced    Spouse name: Not on file   Number of children: 2   Years of education: Not on file   Highest education level: Not on file  Occupational History   Occupation: unemployed   Occupation: Disabilty  Tobacco Use   Smoking status:  Former    Current packs/day: 0.00    Average packs/day: 0.5 packs/day for 25.0 years (12.5 ttl pk-yrs)    Types: Cigarettes    Start date: 03/21/1993    Quit date: 03/21/2018    Years since quitting: 5.8   Smokeless tobacco: Never  Vaping Use   Vaping status: Former  Substance and Sexual Activity   Alcohol use: Yes    Alcohol/week: 76.0 standard drinks of alcohol    Types: 36 Cans of beer, 40 Shots of liquor per week   Drug use: Yes    Frequency: 1.0 times per week    Types: Marijuana    Comment: marajuana...also valium not prescribed   Sexual activity: Not Currently  Other Topics Concern   Not on file  Social History Narrative   05/01/2018:   Lives with mother on one level   Has two 18yo sons who live nearby   Enjoys hunting/fishing   Since being hospitalized for COPD exacerbation, has quit smoking, and is motivated to do aerobic exercise   Heavy alcohol consumer, does not see it as a problem, no interest in reducing alcohol intake.   Social Drivers of Corporate Investment Banker Strain: Low Risk  (01/18/2023)   Overall Financial Resource Strain (CARDIA)    Difficulty of Paying Living Expenses: Not very hard  Food Insecurity: No Food Insecurity (05/16/2022)   Hunger Vital Sign    Worried About Running Out of Food in the Last Year: Never true    Ran Out of Food in the Last Year: Never true  Transportation Needs: No Transportation Needs (01/18/2023)   PRAPARE - Administrator, Civil Service (Medical): No    Lack of Transportation (Non-Medical): No  Physical Activity: Insufficiently Active (01/31/2024)   Exercise Vital Sign    Days of Exercise per Week: 3 days    Minutes of Exercise per Session: 20 min  Stress: No Stress Concern Present (01/31/2024)   [employer] of Occupational Health - Occupational Stress Questionnaire    Feeling of Stress: Not at all  Social Connections: Moderately Isolated (01/31/2024)   Social Connection and Isolation Panel    Frequency  of Communication with Friends and Family: More than three times a week    Frequency of Social Gatherings with Friends and Family: Three times a week    Attends Religious Services: Never    Active Member of Clubs or Organizations: No    Attends Banker Meetings: Never    Marital Status: Living with partner  Intimate Partner Violence: Not At Risk (01/18/2023)   Humiliation, Afraid, Rape, and Kick questionnaire    Fear of Current or Ex-Partner: No    Emotionally Abused: No    Physically Abused: No    Sexually Abused: No    Family History  Problem Relation Age of Onset   Other Mother        Wegener's Disease   Thyroid  disease Mother    Alcoholism Mother    Eating disorder Mother    Obesity Mother    Drug abuse Father  Alcohol abuse Father    Asthma Father    Eating disorder Father    Obesity Father    Lung cancer Paternal Grandmother    Colon cancer Neg Hx    Colon polyps Neg Hx    Esophageal cancer Neg Hx    Rectal cancer Neg Hx    Stomach cancer Neg Hx     Current Outpatient Medications on File Prior to Visit  Medication Sig Dispense Refill   albuterol  (PROVENTIL ) (2.5 MG/3ML) 0.083% nebulizer solution Take 3 mLs (2.5 mg total) by nebulization every 4 (four) hours as needed for wheezing or shortness of breath. 75 mL 1   albuterol  (VENTOLIN  HFA) 108 (90 Base) MCG/ACT inhaler INHALE 1-2 PUFFS BY MOUTH EVERY 6 HOURS AS NEEDED FOR WHEEZE OR SHORTNESS OF BREATH 8.5 each 5   aspirin  81 MG tablet Take 2 tablets (161 mg total) by mouth daily. 30 tablet 0   SYMBICORT  160-4.5 MCG/ACT inhaler TAKE 2 PUFFS BY MOUTH TWICE A DAY 10.2 each 11   Tiotropium Bromide  Monohydrate (SPIRIVA  RESPIMAT) 2.5 MCG/ACT AERS Inhale 1 puff into the lungs in the morning and at bedtime. 4 each 5   tirzepatide  (ZEPBOUND ) 10 MG/0.5ML Pen Inject 10 mg into the skin once a week. 6 mL 0   valsartan  (DIOVAN ) 160 MG tablet TAKE 1 TABLET BY MOUTH EVERY DAY (Patient taking differently: Take 80 mg by  mouth daily.) 90 tablet 3   No current facility-administered medications on file prior to visit.    Allergies  Allergen Reactions   Ativan  [Lorazepam ] Other (See Comments)    Pt given Ativan .  Pt became unresponsive.       Physical Exam Vitals requested from patient and listed below if patient had equipment and was able to obtain at home for this virtual visit: There were no vitals filed for this visit. Estimated body mass index is 40.64 kg/m as calculated from the following:   Height as of 11/09/23: 6' 1 (1.854 m).   Weight as of 11/09/23: 308 lb (139.7 kg).  EKG (optional): deferred due to virtual visit  GENERAL: alert, oriented, no acute distress detected; full vision exam deferred due to pandemic and/or virtual encounter  HEENT: atraumatic, conjunttiva clear, no obvious abnormalities on inspection of external nose and ears  NECK: normal movements of the head and neck  LUNGS: on inspection no signs of respiratory distress, breathing rate appears normal, no obvious gross SOB, gasping or wheezing  CV: no obvious cyanosis  MS: moves all visible extremities without noticeable abnormality  PSYCH/NEURO: pleasant and cooperative, no obvious depression or anxiety, speech and thought processing grossly intact, Cognitive function grossly intact  Flowsheet Row Office Visit from [facility]/18/2023 in [facility]  PHQ-9 Total Score 0        01/31/2024    5:55 PM 01/18/2023   11:51 AM 04/10/2022    2:51 PM 12/27/2021    1:26 PM 05/26/2021   10:08 AM  Depression screen PHQ 2/9  Decreased Interest 0 0 0 0 0  Down, Depressed, Hopeless 0 0 0 0 0  PHQ - 2 Score 0 0 0 0 0  Altered sleeping  0 0    Tired, decreased energy  0 0    Change in appetite  0 0    Feeling bad or failure about yourself   0 0    Trouble concentrating  0 0    Moving slowly or fidgety/restless  0 0    Suicidal thoughts  0 0    PHQ-9 Score  0  0     Difficult doing work/chores  Not  difficult at all        Data saved with a previous flowsheet row definition       11/10/2020    2:45 PM 12/27/2021    1:28 PM 04/10/2022    2:51 PM 01/18/2023   11:52 AM 01/31/2024    5:52 PM  Fall Risk  Falls in the past year? 0 0 0 0 0  Was there an injury with Fall? 0  0  0  0  0  Fall Risk Category Calculator 0 0 0 0 0  Fall Risk Category (Retired) Low  Low      (RETIRED) Patient Fall Risk Level  Low fall risk      Patient at Risk for Falls Due to Impaired vision No Fall Risks No Fall Risks No Fall Risks No Fall Risks  Fall risk Follow up Falls prevention discussed  Falls prevention discussed  Falls evaluation completed Falls evaluation completed Falls evaluation completed     Data saved with a previous flowsheet row definition     SUMMARY AND PLAN:  Encounter for Medicare annual wellness exam   Discussed applicable health maintenance/preventive health measures and advised and referred or ordered per patient preferences: -dicussed vaccines due recs/risks, he is considering the shingles vaccine - advised could get at the pharmacy Health Maintenance  Topic Date Due   COVID-19 Vaccine (1) Never done   DTaP/Tdap/Td (1 - Tdap) Never done   Pneumococcal Vaccine: 50+ Years (1 of 2 - PCV) Never done   Hepatitis B Vaccines 19-59 Average Risk (1 of 3 - 19+ 3-dose series) Never done   Zoster Vaccines- Shingrix (1 of 2) Never done   Medicare Annual Wellness (AWV)  01/18/2024   Colonoscopy  10/08/2029   Hepatitis C Screening  Completed   HIV Screening  Completed   HPV VACCINES  Aged Out   Meningococcal B Vaccine  Aged Out     Education and counseling on the following was provided based on the above review of health and a plan/checklist for the patient, along with additional information discussed, was provided for the patient in the patient instructions :  -Advised on importance of completing advanced directives, discussed options for completing and provided information in  patient instructions as well -Provided counseling and plan for difficulty hearing  -Advised and counseled on a healthy lifestyle - including the importance of a healthy diet, regular physical activity, social connections and stress management. -Reviewed patient's current diet. Advised and counseled on a whole foods based healthy diet. A summary of a healthy diet was provided in the Patient Instructions.  -reviewed patient's current physical activity level and discussed exercise guidelines for adults. Discussed community resources and ideas for safe exercise at home to assist in meeting exercise guideline recommendations in a safe and healthy way.  -Advise yearly dental visits at minimum and regular eye exams -Advised and counseled on alcohol safe limits, risks, he does not seem ready to commit to quitting at this time, advised if wishes to see his doctor and would need to cut back gradually while monitoring for withdrawal.   Follow up: see patient instructions   Patient Instructions  I really enjoyed getting to talk with you today! I am available on Tuesdays and Thursdays for virtual visits if you have any questions or concerns, or if I can be of any further assistance.   CHECKLIST FROM  ANNUAL WELLNESS VISIT:  -Follow up (please call to schedule if not scheduled after visit):   -yearly for annual wellness visit with primary care office  Here is a list of your preventive care/health maintenance measures and the plan for each if any are due:  PLAN For any measures below that may be due:    1. Can get vaccines at the pharmacy. Please let us  know if you do so that we can update your record.   Health Maintenance  Topic Date Due   COVID-19 Vaccine (1) Never done   DTaP/Tdap/Td (1 - Tdap) Never done   Pneumococcal Vaccine: 50+ Years (1 of 2 - PCV) Never done   Hepatitis B Vaccines 19-59 Average Risk (1 of 3 - 19+ 3-dose series) Never done   Zoster Vaccines- Shingrix (1 of 2) Never done    Medicare Annual Wellness (AWV)  01/18/2024   Colonoscopy  10/08/2029   Hepatitis C Screening  Completed   HIV Screening  Completed   HPV VACCINES  Aged Out   Meningococcal B Vaccine  Aged Out    -See a dentist at least yearly  -Get your eyes checked and then per your eye specialist's recommendations  -Other issues addressed today:   1. Please see your doctor if you would like to cut back on alcohol. Your health would benefit from alcohol reduction - however you are at risk for withdrawal and should do so with help from your doctor. Would need to cut back gradually.   -I have included below further information regarding a healthy whole foods based diet, physical activity guidelines for adults, stress management and opportunities for social connections. I hope you find this information useful.   -----------------------------------------------------------------------------------------------------------------------------------------------------------------------------------------------------------------------------------------------------------    NUTRITION: -eat real food: lots of colorful vegetables (half the plate) and fruits -5-7 servings of vegetables and fruits per day (fresh or steamed is best), exp. 2 servings of vegetables with lunch and dinner and 2 servings of fruit per day. Berries and greens such as kale and collards are great choices.  -consume on a regular basis:  fresh fruits, fresh veggies, fish, nuts, seeds, healthy oils (such as olive oil, avocado oil), whole grains (make sure for bread/pasta/crackers/etc., that the first ingredient on label contains the word whole), legumes. -can eat small amounts of dairy and lean meat (no larger than the palm of your hand), but avoid processed meats such as ham, bacon, lunch meat, etc. -drink water -try to avoid fast food and pre-packaged foods, processed meat, ultra processed foods/beverages (donuts, candy, etc.) -most experts  advise limiting sodium to < 2300mg  per day, should limit further is any chronic conditions such as high blood pressure, heart disease, diabetes, etc. The American Heart Association advised that < 1500mg  is is ideal -try to avoid foods/beverages that contain any ingredients with names you do not recognize  -try to avoid foods/beverages  with added sugar or sweeteners/sweets  -try to avoid sweet drinks (including diet drinks): soda, juice, Gatorade, sweet tea, power drinks, diet drinks -try to avoid white rice, white bread, pasta (unless whole grain)  EXERCISE GUIDELINES FOR ADULTS: -if you wish to increase your physical activity, do so gradually and with the approval of your doctor -STOP and seek medical care immediately if you have any chest pain, chest discomfort or trouble breathing when starting or increasing exercise  -move and stretch your body, legs, feet and arms when sitting for long periods -Physical activity guidelines for optimal health in adults: -get at least 150 minutes  per week of moderate exercise (can talk, but not sing); this is about 20-30 minutes of sustained activity 5-7 days per week or two 10-15 minute episodes of sustained activity 5-7 days per week -do some muscle building/resistance training/strength training at least 2 days per week  -balance exercises 3+ days per week:   Stand somewhere where you have something sturdy to hold onto if you lose balance    1) lift up on toes, then back down, start with 5x per day and work up to 20x   2) stand and lift one leg straight out to the side so that foot is a few inches of the floor, start with 5x each side and work up to 20x each side   3) stand on one foot, start with 5 seconds each side and work up to 20 seconds on each side  If you need ideas or help with getting more active:  -Silver sneakers https://tools.silversneakers.com  -Walk with a Doc: Http://www.duncan-williams.com/  -try to include resistance (weight  lifting/strength building) and balance exercises twice per week: or the following link for ideas: http://castillo-powell.com/  buyducts.dk  STRESS MANAGEMENT: -can try meditating, or just sitting quietly with deep breathing while intentionally relaxing all parts of your body for 5 minutes daily -if you need further help with stress, anxiety or depression please follow up with your primary doctor or contact the wonderful folks at [facility]: [phone]  SOCIAL CONNECTIONS: -options in [place] if you wish to engage in more social and exercise related activities:  -Silver sneakers https://tools.silversneakers.com  -Walk with a Doc: Http://www.duncan-williams.com/  -Check out the [facility] Active Adults 50+ section on the [place] of [employer] (hiking clubs, book clubs, cards and games, chess, exercise classes, aquatic classes and much more) - see the website for details: https://www.[place].gov/departments/parks-recreation/active-adults50  -YouTube has lots of exercise videos for different ages and abilities as well  -[facility] (a variety of indoor and outdoor inperson activities for adults). [phone]. [address].  -Virtual Online Classes (a variety of topics): see seniorplanet.org or call [phone]  -consider volunteering at a school, hospice center, church, senior center or elsewhere   ADVANCED HEALTHCARE DIRECTIVES:  [place] Advanced Directives assistance:   expressweek.com.cy  Everyone should have advanced health care directives in place. This is so that you get the care you want, should you ever be in a situation where you are unable to make your own medical decisions.   From the [place] Advanced Directive Website: Advance Health Care Directives are legal documents in which you give  written instructions about your health care if, in the future, you cannot speak for yourself.   A health care power of attorney allows you to name a person you trust to make your health care decisions if you cannot make them yourself. A declaration of a desire for a natural death (or living will) is document, which states that you desire not to have your life prolonged by extraordinary measures if you have a terminal or incurable illness or if you are in a vegetative state. An advance instruction for mental health treatment makes a declaration of instructions, information and preferences regarding your mental health treatment. It also states that you are aware that the advance instruction authorizes a mental health treatment provider to act according to your wishes. It may also outline your consent or refusal of mental health treatment. A declaration of an anatomical gift allows anyone over the age of 30 to make a gift by will, organ  donor card or other document.   Please see the following website or an elder law attorney for forms, FAQs and for completion of advanced directives: [place]  Print Production Planner Health Care Directives Advance Health Care Directives (http://guzman.com/)  Or copy and paste the following to your web browser: Poshchat.fi   Alcohol Misuse and Dependence Information, Adult Alcohol is a widely available drug and people choose to drink alcohol in different amounts. Alcohol misuse and dependence can have a negative effect on your life. Alcohol misuse is when you use alcohol too much or too often. You may have a hard time setting a limit on the amount you drink. Alcohol dependence is when you use alcohol consistently for a period of time, and your body changes as a result. Alcohol dependence can make it hard for you to stop drinking because you may start to feel sick or different when you do not drink alcohol. These symptoms are known as withdrawal. People who  drink alcohol very often and in large amounts, may develop what is called an alcohol use disorder. How can alcohol misuse and dependence affect me? Drinking too much can lead to addiction. You may feel like you need alcohol to function normally. You may drink alcohol before work in the morning, during the day, or as soon as you get home from work in the evening. These actions can result in: Poor work performance. Job loss. Financial problems. Car crashes or criminal charges from driving after drinking alcohol. Problems in your relationships with friends and family. Losing the trust and respect of coworkers, friends, and family. Drinking heavily over a long period of time can permanently damage your body and brain, and can cause lifelong health issues, such as: Damage to your liver or pancreas. Heart problems, high blood pressure, or stroke. Certain cancers. Decreased ability to fight infections. Brain or nerve damage. Depression. Early death, also called premature death. If you are careless or you crave alcohol, it is easy to drink more than your body can handle (overdose). Alcohol overdose is a serious situation that requires hospitalization. It may lead to permanent injuries or death. What can increase my risk? Having a family history of alcohol misuse. Having depression or other mental health conditions. Beginning to drink at an early age. Binge drinking often. Experiencing trauma, stress, and an unstable home life during childhood. Spending time with people who drink often. What actions can I take to prevent alcohol misuse and dependence? Do not drink alcohol if: Your health care provider tells you not to drink. You are pregnant, may be pregnant, or are planning to become pregnant. If you drink alcohol: Limit how much you have to: 0-1 drink a day for women who are not pregnant. 0-2 drinks a day for men. Know how much alcohol is in your drink. In the [place] one drink equals one 12  oz bottle of beer (355 mL), one 5 oz glass of wine (148 mL), or one 1 oz glass of hard liquor (44 mL). If you think you have an alcohol dependency problem, decide to stop drinking. This can be very hard to do if you are used to frequently drinking alcohol. If you begin to have withdrawal symptoms, talk with your health care provider or a person that you trust. These symptoms may include anxiety, shaky hands, headache, nausea, sweating, or not being able to sleep. Choose to drink nonalcoholic beverages in social gatherings and places where there may be alcohol. Activity Spend more time on activities that  you enjoy that do not involve alcohol, like hobbies or exercise. Find healthy ways to cope with stress, such as meditation or spending time with people you care about. General information Talk to your family, coworkers, and friends about supporting you in your efforts to stop drinking. If they drink, ask them not to drink around you. Spend more time with people who do not drink alcohol. If you think that you have an alcohol dependency problem: Tell friends or family about your concerns. Talk with your health care provider or another health professional about where to get help. Work with a paramedic and a network engineer. Consider joining a support group for people who struggle with alcohol misuse and dependence. Where to find support  Your health care provider. SMART Recovery: smartrecovery.org Local treatment centers or chemical dependency counselors. Local AA groups in your community: customizedrugs.fi Where to find more information Centers for Disease Control and Prevention: tonerpromos.no [employer] on Alcohol Abuse and Alcoholism: backupsupply.hu Alcoholics Anonymous (AA): customizedrugs.fi Contact a health care provider if: You drank more or for longer than you intended on more than one occasion. You often drink to the point of vomiting or passing out. You have problems in your life due to  drinking, but you continue to drink. You keep drinking even though you feel anxious, depressed, or have experienced memory loss. You have stopped doing the things you used to enjoy in order to drink. You have to drink more than you used to in order to get the effect you want. You experience anxiety, sweating, nausea, shakiness, and trouble sleeping when you try to stop drinking. Get help right away if: You have serious withdrawal symptoms, including: Confusion. Racing heart. High blood pressure. Fever. These symptoms may be an emergency. Get help right away. Call 911. Do not wait to see if the symptoms will go away. Do not drive yourself to the hospital. Also, get help right away if: You have thoughts about hurting yourself or others. Take one of these steps if you feel like you may hurt yourself or others, or have thoughts about taking your own life: Call 911. Call the National Suicide Prevention Lifeline at 831-010-7858 or 988. This is open 24 hours a day. Text the Crisis Text Line at 540-823-6721. Summary Alcohol misuse and dependence can have a negative effect on your life. Drinking too much or too often can lead to addiction. If you drink alcohol, limit how much you use. If you are having trouble keeping your drinking under control, find ways to change your behavior. Hobbies, calming activities, exercise, or support groups can help. If you feel you need help with changing your drinking habits, talk with your health care provider, a good friend, or a therapist, or go to a support group. This information is not intended to replace advice given to you by your health care provider. Make sure you discuss any questions you have with your health care provider. Document Revised: 04/20/2021 Document Reviewed: 04/20/2021 Elsevier Patient Education  2024 Elsevier Inc.         Chiquita JONELLE Cramp, [place]

## 2024-01-31 NOTE — Patient Instructions (Addendum)
 I really enjoyed getting to talk with you today! I am available on Tuesdays and Thursdays for virtual visits if you have any questions or concerns, or if I can be of any further assistance.   CHECKLIST FROM ANNUAL WELLNESS VISIT:  -Follow up (please call to schedule if not scheduled after visit):   -yearly for annual wellness visit with primary care office  Here is a list of your preventive care/health maintenance measures and the plan for each if any are due:  PLAN For any measures below that may be due:    1. Can get vaccines at the pharmacy. Please let us  know if you do so that we can update your record.   Health Maintenance  Topic Date Due   COVID-19 Vaccine (1) Never done   DTaP/Tdap/Td (1 - Tdap) Never done   Pneumococcal Vaccine: 50+ Years (1 of 2 - PCV) Never done   Hepatitis B Vaccines 19-59 Average Risk (1 of 3 - 19+ 3-dose series) Never done   Zoster Vaccines- Shingrix (1 of 2) Never done   Medicare Annual Wellness (AWV)  01/18/2024   Colonoscopy  10/08/2029   Hepatitis C Screening  Completed   HIV Screening  Completed   HPV VACCINES  Aged Out   Meningococcal B Vaccine  Aged Out    -See a dentist at least yearly  -Get your eyes checked and then per your eye specialist's recommendations  -Other issues addressed today:   1. Please see your doctor if you would like to cut back on alcohol. Your health would benefit from alcohol reduction - however you are at risk for withdrawal and should do so with help from your doctor. Would need to cut back gradually.   -I have included below further information regarding a healthy whole foods based diet, physical activity guidelines for adults, stress management and opportunities for social connections. I hope you find this information useful.    -----------------------------------------------------------------------------------------------------------------------------------------------------------------------------------------------------------------------------------------------------------    NUTRITION: -eat real food: lots of colorful vegetables (half the plate) and fruits -5-7 servings of vegetables and fruits per day (fresh or steamed is best), exp. 2 servings of vegetables with lunch and dinner and 2 servings of fruit per day. Berries and greens such as kale and collards are great choices.  -consume on a regular basis:  fresh fruits, fresh veggies, fish, nuts, seeds, healthy oils (such as olive oil, avocado oil), whole grains (make sure for bread/pasta/crackers/etc., that the first ingredient on label contains the word whole), legumes. -can eat small amounts of dairy and lean meat (no larger than the palm of your hand), but avoid processed meats such as ham, bacon, lunch meat, etc. -drink water -try to avoid fast food and pre-packaged foods, processed meat, ultra processed foods/beverages (donuts, candy, etc.) -most experts advise limiting sodium to < 2300mg  per day, should limit further is any chronic conditions such as high blood pressure, heart disease, diabetes, etc. The American Heart Association advised that < 1500mg  is is ideal -try to avoid foods/beverages that contain any ingredients with names you do not recognize  -try to avoid foods/beverages  with added sugar or sweeteners/sweets  -try to avoid sweet drinks (including diet drinks): soda, juice, Gatorade, sweet tea, power drinks, diet drinks -try to avoid white rice, white bread, pasta (unless whole grain)  EXERCISE GUIDELINES FOR ADULTS: -if you wish to increase your physical activity, do so gradually and with the approval of your doctor -STOP and seek medical care immediately if you have any chest pain,  chest discomfort or trouble breathing when starting or  increasing exercise  -move and stretch your body, legs, feet and arms when sitting for long periods -Physical activity guidelines for optimal health in adults: -get at least 150 minutes per week of moderate exercise (can talk, but not sing); this is about 20-30 minutes of sustained activity 5-7 days per week or two 10-15 minute episodes of sustained activity 5-7 days per week -do some muscle building/resistance training/strength training at least 2 days per week  -balance exercises 3+ days per week:   Stand somewhere where you have something sturdy to hold onto if you lose balance    1) lift up on toes, then back down, start with 5x per day and work up to 20x   2) stand and lift one leg straight out to the side so that foot is a few inches of the floor, start with 5x each side and work up to 20x each side   3) stand on one foot, start with 5 seconds each side and work up to 20 seconds on each side  If you need ideas or help with getting more active:  -Silver sneakers https://tools.silversneakers.com  -Walk with a Doc: Http://www.duncan-williams.com/  -try to include resistance (weight lifting/strength building) and balance exercises twice per week: or the following link for ideas: http://castillo-powell.com/  buyducts.dk  STRESS MANAGEMENT: -can try meditating, or just sitting quietly with deep breathing while intentionally relaxing all parts of your body for 5 minutes daily -if you need further help with stress, anxiety or depression please follow up with your primary doctor or contact the wonderful folks at [facility]: [phone]  SOCIAL CONNECTIONS: -options in [place] if you wish to engage in more social and exercise related activities:  -Silver sneakers https://tools.silversneakers.com  -Walk with a Doc: Http://www.duncan-williams.com/  -Check out the [facility] Active Adults 50+  section on the [place] of [employer] (hiking clubs, book clubs, cards and games, chess, exercise classes, aquatic classes and much more) - see the website for details: https://www.[place].gov/departments/parks-recreation/active-adults50  -YouTube has lots of exercise videos for different ages and abilities as well  -[facility] (a variety of indoor and outdoor inperson activities for adults). [phone]. [address]  -Virtual Online Classes (a variety of topics): see seniorplanet.org or call [phone]  -consider volunteering at a school, hospice center, church, senior center or elsewhere   ADVANCED HEALTHCARE DIRECTIVES:  [place] Advanced Directives assistance:   expressweek.com.cy  Everyone should have advanced health care directives in place. This is so that you get the care you want, should you ever be in a situation where you are unable to make your own medical decisions.   From the [place] Advanced Directive Website: Advance Health Care Directives are legal documents in which you give written instructions about your health care if, in the future, you cannot speak for yourself.   A health care power of attorney allows you to name a person you trust to make your health care decisions if you cannot make them yourself. A declaration of a desire for a natural death (or living will) is document, which states that you desire not to have your life prolonged by extraordinary measures if you have a terminal or incurable illness or if you are in a vegetative state. An advance instruction for mental health treatment makes a declaration of instructions, information and preferences regarding your mental health treatment. It also states that you are aware that the advance instruction authorizes a mental health treatment provider  to act according to your wishes. It may also outline your consent or refusal of mental  health treatment. A declaration of an anatomical gift allows anyone over the age of 76 to make a gift by will, organ donor card or other document.   Please see the following website or an elder law attorney for forms, FAQs and for completion of advanced directives: [place]  Print [employer] Planner Health Care Directives Advance Health Care Directives (http://guzman.com/)  Or copy and paste the following to your web browser: Poshchat.fi   Alcohol Misuse and Dependence Information, Adult Alcohol is a widely available drug and people choose to drink alcohol in different amounts. Alcohol misuse and dependence can have a negative effect on your life. Alcohol misuse is when you use alcohol too much or too often. You may have a hard time setting a limit on the amount you drink. Alcohol dependence is when you use alcohol consistently for a period of time, and your body changes as a result. Alcohol dependence can make it hard for you to stop drinking because you may start to feel sick or different when you do not drink alcohol. These symptoms are known as withdrawal. People who drink alcohol very often and in large amounts, may develop what is called an alcohol use disorder. How can alcohol misuse and dependence affect me? Drinking too much can lead to addiction. You may feel like you need alcohol to function normally. You may drink alcohol before work in the morning, during the day, or as soon as you get home from work in the evening. These actions can result in: Poor work performance. Job loss. Financial problems. Car crashes or criminal charges from driving after drinking alcohol. Problems in your relationships with friends and family. Losing the trust and respect of coworkers, friends, and family. Drinking heavily over a long period of time can permanently damage your body and brain, and can cause lifelong health issues, such as: Damage to your liver or pancreas. Heart problems,  high blood pressure, or stroke. Certain cancers. Decreased ability to fight infections. Brain or nerve damage. Depression. Early death, also called premature death. If you are careless or you crave alcohol, it is easy to drink more than your body can handle (overdose). Alcohol overdose is a serious situation that requires hospitalization. It may lead to permanent injuries or death. What can increase my risk? Having a family history of alcohol misuse. Having depression or other mental health conditions. Beginning to drink at an early age. Binge drinking often. Experiencing trauma, stress, and an unstable home life during childhood. Spending time with people who drink often. What actions can I take to prevent alcohol misuse and dependence? Do not drink alcohol if: Your health care provider tells you not to drink. You are pregnant, may be pregnant, or are planning to become pregnant. If you drink alcohol: Limit how much you have to: 0-1 drink a day for women who are not pregnant. 0-2 drinks a day for men. Know how much alcohol is in your drink. In the [place] one drink equals one 12 oz bottle of beer (355 mL), one 5 oz glass of wine (148 mL), or one 1 oz glass of hard liquor (44 mL). If you think you have an alcohol dependency problem, decide to stop drinking. This can be very hard to do if you are used to frequently drinking alcohol. If you begin to have withdrawal symptoms, talk with your health care provider or a person that you trust.  These symptoms may include anxiety, shaky hands, headache, nausea, sweating, or not being able to sleep. Choose to drink nonalcoholic beverages in social gatherings and places where there may be alcohol. Activity Spend more time on activities that you enjoy that do not involve alcohol, like hobbies or exercise. Find healthy ways to cope with stress, such as meditation or spending time with people you care about. General information Talk to your family,  coworkers, and friends about supporting you in your efforts to stop drinking. If they drink, ask them not to drink around you. Spend more time with people who do not drink alcohol. If you think that you have an alcohol dependency problem: Tell friends or family about your concerns. Talk with your health care provider or another health professional about where to get help. Work with a paramedic and a network engineer. Consider joining a support group for people who struggle with alcohol misuse and dependence. Where to find support  Your health care provider. SMART Recovery: smartrecovery.org Local treatment centers or chemical dependency counselors. Local AA groups in your community: customizedrugs.fi Where to find more information Centers for Disease Control and Prevention: tonerpromos.no [employer] on Alcohol Abuse and Alcoholism: backupsupply.hu Alcoholics Anonymous (AA): customizedrugs.fi Contact a health care provider if: You drank more or for longer than you intended on more than one occasion. You often drink to the point of vomiting or passing out. You have problems in your life due to drinking, but you continue to drink. You keep drinking even though you feel anxious, depressed, or have experienced memory loss. You have stopped doing the things you used to enjoy in order to drink. You have to drink more than you used to in order to get the effect you want. You experience anxiety, sweating, nausea, shakiness, and trouble sleeping when you try to stop drinking. Get help right away if: You have serious withdrawal symptoms, including: Confusion. Racing heart. High blood pressure. Fever. These symptoms may be an emergency. Get help right away. Call 911. Do not wait to see if the symptoms will go away. Do not drive yourself to the hospital. Also, get help right away if: You have thoughts about hurting yourself or others. Take one of these steps if you feel like you may hurt yourself or  others, or have thoughts about taking your own life: Call 911. Call the National Suicide Prevention Lifeline at (438)402-6916 or 988. This is open 24 hours a day. Text the Crisis Text Line at 203-032-0088. Summary Alcohol misuse and dependence can have a negative effect on your life. Drinking too much or too often can lead to addiction. If you drink alcohol, limit how much you use. If you are having trouble keeping your drinking under control, find ways to change your behavior. Hobbies, calming activities, exercise, or support groups can help. If you feel you need help with changing your drinking habits, talk with your health care provider, a good friend, or a therapist, or go to a support group. This information is not intended to replace advice given to you by your health care provider. Make sure you discuss any questions you have with your health care provider. Document Revised: 04/20/2021 Document Reviewed: 04/20/2021 Elsevier Patient Education  2024 [employer].

## 2024-02-04 ENCOUNTER — Other Ambulatory Visit (HOSPITAL_COMMUNITY): Payer: Self-pay

## 2024-02-04 ENCOUNTER — Other Ambulatory Visit: Payer: Self-pay | Admitting: Adult Health

## 2024-02-04 DIAGNOSIS — Z6841 Body Mass Index (BMI) 40.0 and over, adult: Secondary | ICD-10-CM

## 2024-02-04 DIAGNOSIS — G4733 Obstructive sleep apnea (adult) (pediatric): Secondary | ICD-10-CM

## 2024-02-06 ENCOUNTER — Other Ambulatory Visit (HOSPITAL_COMMUNITY): Payer: Self-pay

## 2024-02-07 ENCOUNTER — Other Ambulatory Visit: Payer: Self-pay | Admitting: Adult Health

## 2024-02-07 ENCOUNTER — Other Ambulatory Visit (HOSPITAL_COMMUNITY): Payer: Self-pay

## 2024-02-07 DIAGNOSIS — G4733 Obstructive sleep apnea (adult) (pediatric): Secondary | ICD-10-CM

## 2024-02-07 DIAGNOSIS — Z6841 Body Mass Index (BMI) 40.0 and over, adult: Secondary | ICD-10-CM

## 2024-02-07 DIAGNOSIS — E66813 Obesity, class 3: Secondary | ICD-10-CM

## 2024-02-08 ENCOUNTER — Other Ambulatory Visit: Payer: Self-pay | Admitting: Adult Health

## 2024-02-08 ENCOUNTER — Other Ambulatory Visit: Payer: Self-pay

## 2024-02-08 ENCOUNTER — Other Ambulatory Visit (HOSPITAL_COMMUNITY): Payer: Self-pay

## 2024-02-08 DIAGNOSIS — E66813 Obesity, class 3: Secondary | ICD-10-CM

## 2024-02-08 DIAGNOSIS — G4733 Obstructive sleep apnea (adult) (pediatric): Secondary | ICD-10-CM

## 2024-02-08 DIAGNOSIS — Z6841 Body Mass Index (BMI) 40.0 and over, adult: Secondary | ICD-10-CM

## 2024-02-11 ENCOUNTER — Other Ambulatory Visit (HOSPITAL_COMMUNITY): Payer: Self-pay

## 2024-02-12 ENCOUNTER — Other Ambulatory Visit: Payer: Self-pay | Admitting: Adult Health

## 2024-02-12 ENCOUNTER — Telehealth: Payer: Self-pay

## 2024-02-12 ENCOUNTER — Other Ambulatory Visit (HOSPITAL_COMMUNITY): Payer: Self-pay

## 2024-02-12 DIAGNOSIS — G4733 Obstructive sleep apnea (adult) (pediatric): Secondary | ICD-10-CM

## 2024-02-12 DIAGNOSIS — Z6841 Body Mass Index (BMI) 40.0 and over, adult: Secondary | ICD-10-CM

## 2024-02-12 DIAGNOSIS — E66813 Obesity, class 3: Secondary | ICD-10-CM

## 2024-02-12 NOTE — Telephone Encounter (Unsigned)
 Copied from CRM #[id number]. Topic: Clinical - Prescription Issue >> Feb 11, 2024  9:43 AM Rea ORN wrote: Reason for CRM: Pt called to follow up on rx request for tirzepatide  (ZEPBOUND ) 10 MG/0.5ML Pen. Pt stated he requested this last week from the pharmacy. He has been out of this since 02/03/24.   Please call back, [phone]

## 2024-02-13 ENCOUNTER — Other Ambulatory Visit (HOSPITAL_COMMUNITY): Payer: Self-pay

## 2024-02-13 MED ORDER — ZEPBOUND 10 MG/0.5ML ~~LOC~~ SOAJ
10.0000 mg | SUBCUTANEOUS | 0 refills | Status: DC
  Filled 2024-02-13: qty 2, 28d supply, fill #0

## 2024-02-13 NOTE — Telephone Encounter (Signed)
 Please see other note

## 2024-02-22 ENCOUNTER — Other Ambulatory Visit (HOSPITAL_COMMUNITY): Payer: Self-pay

## 2024-02-29 ENCOUNTER — Other Ambulatory Visit (HOSPITAL_COMMUNITY): Payer: Self-pay

## 2024-02-29 ENCOUNTER — Ambulatory Visit: Admitting: Adult Health

## 2024-02-29 ENCOUNTER — Telehealth (HOSPITAL_COMMUNITY): Payer: Self-pay | Admitting: Pharmacist

## 2024-02-29 ENCOUNTER — Ambulatory Visit
Admission: RE | Admit: 2024-02-29 | Discharge: 2024-02-29 | Disposition: A | Discharge status: Home / Self Care | Source: Ambulatory Visit | Attending: Adult Health | Admitting: Adult Health

## 2024-02-29 ENCOUNTER — Encounter: Payer: Self-pay | Admitting: Adult Health

## 2024-02-29 VITALS — BP 110/60 | HR 73 | Temp 98.0°F | Ht 73.0 in | Wt 312.0 lb

## 2024-02-29 DIAGNOSIS — Z6841 Body Mass Index (BMI) 40.0 and over, adult: Secondary | ICD-10-CM | POA: Diagnosis not present

## 2024-02-29 DIAGNOSIS — G4733 Obstructive sleep apnea (adult) (pediatric): Secondary | ICD-10-CM

## 2024-02-29 DIAGNOSIS — E66813 Obesity, class 3: Secondary | ICD-10-CM

## 2024-02-29 DIAGNOSIS — I10 Essential (primary) hypertension: Secondary | ICD-10-CM | POA: Diagnosis not present

## 2024-02-29 DIAGNOSIS — M79671 Pain in right foot: Secondary | ICD-10-CM | POA: Diagnosis not present

## 2024-02-29 MED ORDER — ZEPBOUND 12.5 MG/0.5ML ~~LOC~~ SOAJ
12.5000 mg | SUBCUTANEOUS | 0 refills | Status: AC
  Filled 2024-02-29 – 2024-03-14 (×3): qty 2, 28d supply, fill #0

## 2024-02-29 NOTE — Progress Notes (Signed)
 "  Subjective:    Patient ID: Billy [name], male    DOB: 09/02/1970, 54 y.o.   MRN: [id number]  HPI 54 year old male who  has a past medical history of Alcohol use, Allergy, COPD (chronic obstructive pulmonary disease) (HCC), Drug use, Edema of both lower extremities, History of bronchitis, Hypertension, Obesity, Oxygen  deficiency, PFO (patent foramen ovale), Polycythemia, PVD (peripheral vascular disease), Sleep apnea, SOB (shortness of breath), Substance abuse (HCC), and Tobacco abuse.  He presents to the office today for follow up regarding OSA/Obesity and HTN    He was  started on Zepbound  about eight months ago ( 06/21/2023) to help with weight loss and OSA.  He was exercising but found it difficult to do much in the way of distance when walking due to dyspnea on exertion from COPD.  He was having a hard time also wearing his CPAP.  He is currently on Zepbound  10 mg weekly. He has had some significant constipation and started a fiber supplement which has helped tremendously. He has been able to lose about 14 pounds since starting Zepbound .  Wt Readings from Last 10 Encounters:  02/29/24 (!) 312 lb (141.5 kg)  11/09/23 (!) 308 lb (139.7 kg)  09/21/23 (!) 312 lb (141.5 kg)  08/17/23 (!) 319 lb 3.2 oz (144.8 kg)  07/20/23 (!) 323 lb (146.5 kg)  06/21/23 (!) 326 lb (147.9 kg)  03/26/23 (!) 328 lb (148.8 kg)  03/22/23 (!) 329 lb 3.2 oz (149.3 kg)  03/12/23 (!) 325 lb (147.4 kg)  11/21/22 (!) 325 lb 3.2 oz (147.5 kg)   HTN - managed with Valsartan  160 mg daily ( this was decreased from 160mg  about three months ago). He denies dizziness, lightheadedness, blurred vision.  BP Readings from Last 3 Encounters:  02/29/24 110/60  11/09/23 90/60  09/21/23 102/60   Acutely he reports that he woke up this morning to use the restroom and when he stepped out of bed he nearly fell down from pain when he put his right foot on the floor. Prior to that he reports that his foot  felt weird, he  is unsure of he caused any trauma from dancing and being on his feet at a NYE party.    Review of Systems See HPI   Past Medical History:  Diagnosis Date   Alcohol use    Allergy    as a child- none as adult    COPD (chronic obstructive pulmonary disease) (HCC)    Drug use    Edema of both lower extremities    History of bronchitis    Hypertension    Obesity    Oxygen  deficiency    Pt uses 02  3 liters with his CPAP ONLY    PFO (patent foramen ovale)    Polycythemia    a. Dr. Timmy.SABRASABRAJAK-2 analysis is negative.  b. likely physiologic secondary to chronic hypoxia (? OSA + COPD + obesity).  c. s/p plebotomy x1   PVD (peripheral vascular disease)    Sleep apnea    a. mod severe by sleep study 9.30.11: AHI 44.8 per hour; oxygen  desat to a nadir of 65%; unsuccessful CPAP titration; significant hypoxemia even with supp o2 at 3LPM (dest to 70-80%); needs eval for cardiopulmonary disease and upper airway obstruction   SOB (shortness of breath)    Substance abuse (HCC)    alcoholism    Tobacco abuse     Social History   Socioeconomic History   Marital status: Divorced  Spouse name: Not on file   Number of children: 2   Years of education: Not on file   Highest education level: Not on file  Occupational History   Occupation: unemployed   Occupation: Disabilty  Tobacco Use   Smoking status: Former    Current packs/day: 0.00    Average packs/day: 0.5 packs/day for 25.0 years (12.5 ttl pk-yrs)    Types: Cigarettes    Start date: 03/21/1993    Quit date: 03/21/2018    Years since quitting: 5.9   Smokeless tobacco: Never  Vaping Use   Vaping status: Former  Substance and Sexual Activity   Alcohol use: Yes    Alcohol/week: 76.0 standard drinks of alcohol    Types: 36 Cans of beer, 40 Shots of liquor per week   Drug use: Yes    Frequency: 1.0 times per week    Types: Marijuana    Comment: marajuana...also valium not prescribed   Sexual activity: Not Currently  Other  Topics Concern   Not on file  Social History Narrative   05/01/2018:   Lives with mother on one level   Has two 18yo sons who live nearby   Enjoys hunting/fishing   Since being hospitalized for COPD exacerbation, has quit smoking, and is motivated to do aerobic exercise   Heavy alcohol consumer, does not see it as a problem, no interest in reducing alcohol intake.   Social Drivers of Health   Tobacco Use: Medium Risk (02/29/2024)   Patient History    Smoking Tobacco Use: Former    Smokeless Tobacco Use: Never    Passive Exposure: Not on file  Financial Resource Strain: Low Risk (01/18/2023)   Overall Financial Resource Strain (CARDIA)    Difficulty of Paying Living Expenses: Not very hard  Food Insecurity: No Food Insecurity (05/16/2022)   Hunger Vital Sign    Worried About Running Out of Food in the Last Year: Never true    Ran Out of Food in the Last Year: Never true  Transportation Needs: No Transportation Needs (01/18/2023)   PRAPARE - Administrator, Civil Service (Medical): No    Lack of Transportation (Non-Medical): No  Physical Activity: Insufficiently Active (01/31/2024)   Exercise Vital Sign    Days of Exercise per Week: 3 days    Minutes of Exercise per Session: 20 min  Stress: No Stress Concern Present (01/31/2024)   [employer] of Occupational Health - Occupational Stress Questionnaire    Feeling of Stress: Not at all  Social Connections: Moderately Isolated (01/31/2024)   Social Connection and Isolation Panel    Frequency of Communication with Friends and Family: More than three times a week    Frequency of Social Gatherings with Friends and Family: Three times a week    Attends Religious Services: Never    Active Member of Clubs or Organizations: No    Attends Banker Meetings: Never    Marital Status: Living with partner  Intimate Partner Violence: Not At Risk (01/18/2023)   Humiliation, Afraid, Rape, and Kick questionnaire    Fear of  Current or Ex-Partner: No    Emotionally Abused: No    Physically Abused: No    Sexually Abused: No  Depression (PHQ2-9): Low Risk (01/31/2024)   Depression (PHQ2-9)    PHQ-2 Score: 0  Alcohol Screen: Medium Risk (01/18/2023)   Alcohol Screen    Last Alcohol Screening Score (AUDIT): 11  Housing: Low Risk (05/16/2022)   Housing    Last  Housing Risk Score: 0  Utilities: Not At Risk (01/18/2023)   AHC Utilities    Threatened with loss of utilities: No  Health Literacy: Adequate Health Literacy (01/18/2023)   B1300 Health Literacy    Frequency of need for help with medical instructions: Never    Past Surgical History:  Procedure Laterality Date   knee injury  1982   TONSILLECTOMY  1972   TYMPANOSTOMY TUBE PLACEMENT     as child     Family History  Problem Relation Age of Onset   Other Mother        Wegener's Disease   Thyroid  disease Mother    Alcoholism Mother    Eating disorder Mother    Obesity Mother    Drug abuse Father    Alcohol abuse Father    Asthma Father    Eating disorder Father    Obesity Father    Lung cancer Paternal Grandmother    Colon cancer Neg Hx    Colon polyps Neg Hx    Esophageal cancer Neg Hx    Rectal cancer Neg Hx    Stomach cancer Neg Hx     Allergies[1]  Medications Ordered Prior to Encounter[2]  BP 110/60   Pulse 73   Temp 98 F (36.7 C) (Oral)   Ht 6' 1 (1.854 m)   Wt (!) 312 lb (141.5 kg)   SpO2 90%   BMI 41.16 kg/m       Objective:   Physical Exam Vitals and nursing note reviewed.  Constitutional:      Appearance: Normal appearance. He is obese.  Cardiovascular:     Rate and Rhythm: Normal rate and regular rhythm.     Pulses: Normal pulses.     Heart sounds: Normal heart sounds.  Pulmonary:     Effort: Pulmonary effort is normal.     Breath sounds: Normal breath sounds.  Musculoskeletal:        General: Tenderness present.       Feet:  Skin:    General: Skin is warm and dry.  Neurological:     General: No  focal deficit present.     Mental Status: He is alert and oriented to person, place, and time.  Psychiatric:        Mood and Affect: Mood normal.        Behavior: Behavior normal.        Thought Content: Thought content normal.        Judgment: Judgment normal.       Assessment & Plan:   1. Obstructive sleep apnea - Will increase his dose to 12.5 mg weekly.  - Follow up in 3 months for CPE   - tirzepatide  (ZEPBOUND ) 12.5 MG/0.5ML Pen; Inject 12.5 mg into the skin once a week.  Dispense: 6 mL; Refill: 0  2. Class 3 severe obesity due to excess calories with serious comorbidity and body mass index (BMI) of 45.0 to 49.9 in adult (HCC) (Primary)  - tirzepatide  (ZEPBOUND ) 12.5 MG/0.5ML Pen; Inject 12.5 mg into the skin once a week.  Dispense: 6 mL; Refill: 0  3. BMI 40.0-44.9, adult (HCC)  - tirzepatide  (ZEPBOUND ) 12.5 MG/0.5ML Pen; Inject 12.5 mg into the skin once a week.  Dispense: 6 mL; Refill: 0  4. Essential hypertension - Well controlled. No change in medication   5. Right foot pain - No concern for gout. Will get xray but likely strain.  - DG Foot Complete Right; Future   Darleene Shape, NP     [  1]  Allergies Allergen Reactions   Ativan  [Lorazepam ] Other (See Comments)    Pt given Ativan .  Pt became unresponsive.  [2]  Current Outpatient Medications on File Prior to Visit  Medication Sig Dispense Refill   albuterol  (PROVENTIL ) (2.5 MG/3ML) 0.083% nebulizer solution Take 3 mLs (2.5 mg total) by nebulization every 4 (four) hours as needed for wheezing or shortness of breath. 75 mL 1   albuterol  (VENTOLIN  HFA) 108 (90 Base) MCG/ACT inhaler INHALE 1-2 PUFFS BY MOUTH EVERY 6 HOURS AS NEEDED FOR WHEEZE OR SHORTNESS OF BREATH 8.5 each 5   aspirin  81 MG tablet Take 2 tablets (161 mg total) by mouth daily. 30 tablet 0   SYMBICORT  160-4.5 MCG/ACT inhaler TAKE 2 PUFFS BY MOUTH TWICE A DAY 10.2 each 11   Tiotropium Bromide  Monohydrate (SPIRIVA  RESPIMAT) 2.5 MCG/ACT AERS Inhale  1 puff into the lungs in the morning and at bedtime. 4 each 5   valsartan  (DIOVAN ) 160 MG tablet TAKE 1 TABLET BY MOUTH EVERY DAY (Patient taking differently: Take 80 mg by mouth daily.) 90 tablet 3   No current facility-administered medications on file prior to visit.   "

## 2024-03-03 ENCOUNTER — Telehealth: Payer: Self-pay | Admitting: *Deleted

## 2024-03-03 ENCOUNTER — Other Ambulatory Visit (HOSPITAL_COMMUNITY): Payer: Self-pay

## 2024-03-03 ENCOUNTER — Telehealth (HOSPITAL_COMMUNITY): Payer: Self-pay

## 2024-03-03 NOTE — Telephone Encounter (Signed)
 Pharmacy Patient Advocate Encounter  Received notification from [facility] that Prior Authorization for  Zepbound  12.5MG /0.5ML pen-injectors  has been APPROVED from 03/03/24 to 02/26/25. Ran test claim, Copay is $12.65. This test claim was processed through [facility]- copay amounts may vary at other pharmacies due to pharmacy/plan contracts, or as the patient moves through the different stages of their insurance plan.   PA #/Case ID/Reference #: [id number]

## 2024-03-03 NOTE — Telephone Encounter (Signed)
 PA request has been Received. New Encounter has been or will be created for follow up. For additional info see Pharmacy Prior Auth telephone encounter from 03/03/24.

## 2024-03-03 NOTE — Telephone Encounter (Signed)
 Copied from CRM [id number]. Topic: Clinical - Lab/Test Results >> Mar 03, 2024 12:01 PM Billy [name] wrote: Reason for CRM: Patient had x-ray on Friday on right foot and is calling for results. Patient concern foot is broken. Patient CB# [phone]. No mychart please, patient prefers phone call.

## 2024-03-03 NOTE — Telephone Encounter (Signed)
 Pharmacy Patient Advocate Encounter   Received notification from Pt Calls Messages that prior authorization for Zepbound  12.5MG /0.5ML pen-injectors  is required/requested.   Insurance verification completed.   The patient is insured through [employer].   Per test claim: PA required; PA submitted to above mentioned insurance via Latent Key/confirmation #/EOC [id number] Status is pending

## 2024-03-04 ENCOUNTER — Ambulatory Visit: Payer: Self-pay

## 2024-03-04 NOTE — Telephone Encounter (Signed)
 FYI Only or Action Required?: Action required by provider: lab or test result follow-up needed and requesting xray result and plan of care. .  Patient was last seen in primary care on 02/29/2024 by Merna Huxley, NP.  Called Nurse Triage reporting Foot Pain.  Symptoms began several days ago.  Interventions attempted: Rest, hydration, or home remedies.  Symptoms are: gradually worsening.  Triage Disposition: See HCP Within 4 Hours (Or PCP Triage)  Patient/caregiver understands and will follow disposition?: No, wishes to speak with PCP   Copied from CRM [id number]. Topic: Clinical - Red Word Triage >> Mar 04, 2024 10:37 AM Rea ORN wrote: Red Word that prompted transfer to Nurse Triage: Worsening foot pain Reason for Disposition  [1] SEVERE pain (e.g., excruciating, unable to do any normal activities) AND [2] not improved after 2 hours of pain medicine  Protocols used: Foot Pain-A-AH

## 2024-03-04 NOTE — Telephone Encounter (Signed)
 Pt was advised that Xray results are not in yet. Pt is requesting for imaging to be expedited. Pt stated that his foot is still hurting and not getting any better.   Of to expedite imaging?

## 2024-03-11 ENCOUNTER — Ambulatory Visit: Payer: Self-pay | Admitting: Adult Health

## 2024-03-11 DIAGNOSIS — M79673 Pain in unspecified foot: Secondary | ICD-10-CM

## 2024-03-12 ENCOUNTER — Other Ambulatory Visit (HOSPITAL_COMMUNITY): Payer: Self-pay

## 2024-03-12 ENCOUNTER — Other Ambulatory Visit: Payer: Self-pay | Admitting: Adult Health

## 2024-03-12 ENCOUNTER — Telehealth: Payer: Self-pay | Admitting: *Deleted

## 2024-03-12 NOTE — Telephone Encounter (Signed)
 See result note.

## 2024-03-12 NOTE — Telephone Encounter (Signed)
 Copied from CRM [id number]. Topic: Clinical - Lab/Test Results >> Mar 03, 2024 12:01 PM Billy [name] wrote: Reason for CRM: Patient had x-ray on Friday on right foot and is calling for results. Patient concern foot is broken. Patient CB# [phone]. No mychart please, patient prefers phone call. >> Mar 12, 2024 10:18 AM Terri G wrote: Patient called again regarding xray on his foot. His Mychart doesn't work.

## 2024-03-12 NOTE — Addendum Note (Signed)
 Addended by: VICCI LEADER R on: 03/12/2024 03:55 PM   Modules accepted: Orders

## 2024-03-13 ENCOUNTER — Other Ambulatory Visit (HOSPITAL_COMMUNITY): Payer: Self-pay

## 2024-03-14 ENCOUNTER — Other Ambulatory Visit (HOSPITAL_COMMUNITY): Payer: Self-pay

## 2024-03-17 ENCOUNTER — Encounter: Payer: Self-pay | Admitting: Podiatry

## 2024-03-17 ENCOUNTER — Ambulatory Visit: Admitting: Podiatry

## 2024-03-17 ENCOUNTER — Ambulatory Visit

## 2024-03-17 DIAGNOSIS — I739 Peripheral vascular disease, unspecified: Secondary | ICD-10-CM

## 2024-03-17 DIAGNOSIS — M76821 Posterior tibial tendinitis, right leg: Secondary | ICD-10-CM

## 2024-03-17 DIAGNOSIS — M2141 Flat foot [pes planus] (acquired), right foot: Secondary | ICD-10-CM | POA: Diagnosis not present

## 2024-03-17 DIAGNOSIS — M2142 Flat foot [pes planus] (acquired), left foot: Secondary | ICD-10-CM | POA: Diagnosis not present

## 2024-03-17 NOTE — Progress Notes (Unsigned)
 "  Subjective:  Patient ID: Billy [name], male    DOB: 1970/11/06,  MRN: [id number]  Chief Complaint  Patient presents with   Foot Pain    Right foot pain been going on for a few weeks now. Pt stated that the pain started out on the top of his foot but has moved around, he originally thought it was gout he stated that he was unable to put weight on it. He stated that he is aware that he has flat feet.     Discussed the use of AI scribe software for clinical note transcription with the patient, who gave verbal consent to proceed.  History of Present Illness Billy [name] is a 54 year old male with bilateral pes planus and right posterior tibial tendinopathy who presents with acute right foot pain and difficulty with weight-bearing.  On New Year's Day he developed acute right foot pain after stepping out of a van the night before, with no clear injury. Pain began as a cramp-like sensation just distal to the right ankle with a palpable knot. By Friday he could not bear weight and had right foot radiographs done, but results have not been shared. Activity such as grocery shopping worsened the pain. By Monday and Tuesday he had severe pain and required a walker and cane. Elevation and icing have helped somewhat, but he still has pain with full weight-bearing.  The pain has migrated to involve the lateral and posterior right foot and heel and can affect the entire side and back of the foot. Flexion causes minimal pain, but lateral pressure is painful. Standing barefoot is painful and he avoids walking without shoes, especially after swimming.  He has longstanding severe bilateral pes planus with complete loss of arch. He attributes this to prolonged standing in unsupportive footwear. He now wears supportive shoes with inserts and uses a boot and ASO ankle brace at home. He has not obtained custom orthotics due to cost but is open to supportive footwear options. He takes NSAIDs such as  naproxen and ibuprofen . Foot pain limits his ability to walk and lose weight.  He reports family history of pes planus, including a half-brother who had childhood corrective surgery with less severe current symptoms. His mother declined surgery for him when he was a child.  He notes chronic brawny discoloration and hair loss below the knees, thought to be due to venous insufficiency. He was advised to wear compression socks but is not consistent. He has COPD and sleep apnea, quit smoking six years ago, and was told he has normal arterial flow with impaired venous return.      Objective:    Physical Exam MEASUREMENTS: Weight- 310. EXTREMITIES: Dorsalis pedis and posterior tibial pulses faint bilaterally. MUSCULOSKELETAL: Flat feet with no arch, pain on weight bearing, right greater than left. SKIN: Brawny skin changes bilaterally with absent hair growth.   No images are attached to the encounter.    Results    Assessment:   1. Acquired pes planus of right foot   2. Acquired pes planus, left   3. Posterior tibial tendon dysfunction (PTTD) of right lower extremity   4. PVD (peripheral vascular disease)      Plan:  Patient was evaluated and treated and all questions answered.  Assessment and Plan Assessment & Plan Bilateral acquired pes planus with right posterior tibial tendon dysfunction Chronic, flexible pes planus with acute exacerbation of right foot pain due to degenerative changes of the posterior tibial tendon and  ligamentous insufficiency. No acute fracture or rigid deformity. Associated with long-standing pes planus, obesity, and mechanical strain. Motivated for conservative management. - Initiated scanning for custom orthotics to improve arch support and foot alignment. - Advised use of supportive, rigid shoe inserts and high-quality supportive footwear (SAS, [place], [place], [place], [address], [place]). - Instructed to take naproxen twice daily for two weeks, then as  needed for analgesia. - Recommended limiting excessive weight-bearing activity and favoring low-impact exercise (cycling, elliptical). - Discussed use of supportive devices such as air boot or lace-up ankle brace (ASO brace) for additional support as needed. - Provided education on custom versus over-the-counter orthotics and discouraged unnecessary high-cost retail orthotics. - Provided written recommendations for appropriate footwear brands and features.  Peripheral vascular disease with brawny skin changes Chronic peripheral vascular disease with brawny skin changes and absent pedal hair growth, likely secondary to chronic venous insufficiency. Pulses faint but present. No acute arterial compromise or ulceration. - Strongly encouraged daily use of compression stockings to reduce progression of venous insufficiency and manage lower extremity edema. - Reinforced compression therapy as the most effective intervention for his venous disease. - Offered referral to vascular specialist if symptoms worsen or additional management is required.      Return in about 4 weeks (around 04/14/2024) for flat foot.    "

## 2024-04-03 ENCOUNTER — Ambulatory Visit: Admitting: Emergency Medicine

## 2024-04-03 ENCOUNTER — Encounter: Payer: Self-pay | Admitting: Emergency Medicine

## 2024-04-03 VITALS — BP 110/72 | HR 87 | Ht 73.0 in | Wt 310.0 lb

## 2024-04-03 DIAGNOSIS — J449 Chronic obstructive pulmonary disease, unspecified: Secondary | ICD-10-CM | POA: Diagnosis not present

## 2024-04-03 DIAGNOSIS — G4733 Obstructive sleep apnea (adult) (pediatric): Secondary | ICD-10-CM

## 2024-04-03 DIAGNOSIS — J9691 Respiratory failure, unspecified with hypoxia: Secondary | ICD-10-CM | POA: Diagnosis not present

## 2024-04-03 NOTE — Assessment & Plan Note (Signed)
 Overall doing okay.  His last acute exacerbation was about 1 year ago.  He is on Spiriva  and Symbicort  but sometimes misses the Spiriva .  He has albuterol  to use as needed but does not require it every day.  Plan to continue same regimen.

## 2024-04-03 NOTE — Patient Instructions (Signed)
 Please continue to use your Spiriva  and your Symbicort  as she has been doing.  Rinse and gargle after the Symbicort . Keep your albuterol  available to use 2 puffs or 1 nebulizer treatment up to every 4 hours if needed for shortness of breath, chest tightness, wheezing. Continue CPAP with oxygen  bled in every night.  Please let us  know if we need to help you get appropriate equipment, best fit mask. Keep your oxygen  available to use 2 L/min if you need it with exertion.  Our goal is to keep your saturations > 90% Continue to work on your exercise, conditioning and weight loss. Please follow Dr. Shelah in 6 months.  Call if you have any problems so we can see you sooner

## 2024-04-03 NOTE — Assessment & Plan Note (Signed)
 Good compliance and good clinical benefit with his CPAP with oxygen  bled in.  Sometimes he has gotten equipment with an adequate mask fit, now has a better fit through [employer].  We can adjust his orders if necessary pending on how well he is able to obtain the appropriate equipment.

## 2024-04-03 NOTE — Assessment & Plan Note (Signed)
 Overall significant improvement in his oxygen  needs since he got on good therapy for COPD, reliable therapy for his OSA/OHS.  He used to require oxygen  at all times during the day.  He does check his SpO2, does not frequently see desaturations unless he is flaring.  He does still have oxygen  available to use if he needs it.  He also uses it bled into his CPAP nightly

## 2024-04-03 NOTE — Progress Notes (Signed)
 " Subjective:    Patient ID: Billy [name], male    DOB: 03/13/1970, 54 y.o.   MRN: [id number] HPI  ROV 11/21/2022 --follow-up visit for [facility].  He is 52.  I have followed him for restrictive lung disease, superimposed obstructive lung disease, hypoxemia associated with OSA/OHS.  His hypoxemia actually improved significantly with better control of his underlying conditions.  He is reliable with this CPAP, using every night with good clinical benefit. About 54 yrs old, it is his 2nd machine.  He has been managed on Symbicort , restarted Spiriva  back in March 2024.  He reports today that he has gained some wt since last time, had been as low as 285 lbs. He impacted his exercise tolerance, overall breathing - some exertional SOB w stairs. Increased leg pain. Less exercise. On Spiriva  and Symbicort .   ROV 04/03/2024 --Billy [name] is 53.  I have followed him for restrictive lung disease in the setting of OHS/OSA, restrictive lung disease, COPD.  He has had documented hypoxemic respiratory failure with associated polycythemia in the past but this improved as did his oxygen  needs.  He has been managed on CPAP, BD regimen includes Spiriva  and Symbicort  - sometimes the spiriva  use is sporadic. He takes an antihistamine OTC.  He uses albuterol  approximately .  Today he reports that he has been doing fairly well. He has not smoked for over 6 years. His last flare was about 1 yr ago. He is on Zepbound  to lose some wt. He uses oxygen  with his CPAP reliably, has O2 available to use as needed but he checks his SpO2 and does not see desaturations. He is reliable with his CPAP, feels that he is getting good benefit.                                                                                                 Objective:   Physical Exam  Vitals:   04/03/24 1605  BP: 110/72  Pulse: 87  SpO2: 91%  Weight: (!) 310 lb (140.6 kg)  Height: 6' 1 (1.854 m)    Gen: Pleasant, obese, in no distress,  normal affect  ENT: No lesions,   mouth clear,  oropharynx clear, no postnasal drip  Neck: No JVD, no stridor  Lungs: clear B, no wheezes  Cardiovascular: RRR, heart sounds normal, no murmur or gallops, no LE edema.  Compression hose in place  Musculoskeletal: No deformities, no cyanosis or clubbing  Neuro: alert, non focal  Skin: No rash    Assessment & Plan:  COPD (chronic obstructive pulmonary disease) (HCC) Overall doing okay.  His last acute exacerbation was about 1 year ago.  He is on Spiriva  and Symbicort  but sometimes misses the Spiriva .  He has albuterol  to use as needed but does not require it every day.  Plan to continue same regimen.  Obstructive sleep apnea Good compliance and good clinical benefit with his CPAP with oxygen  bled in.  Sometimes he has gotten equipment with an adequate mask fit, now has a better fit through [employer].  We can adjust his orders if necessary pending on how well  he is able to obtain the appropriate equipment.  Respiratory failure with hypoxia (HCC) Overall significant improvement in his oxygen  needs since he got on good therapy for COPD, reliable therapy for his OSA/OHS.  He used to require oxygen  at all times during the day.  He does check his SpO2, does not frequently see desaturations unless he is flaring.  He does still have oxygen  available to use if he needs it.  He also uses it bled into his CPAP nightly   I personally spent a total of 35 minutes in the care of the patient today including preparing to see the patient, getting/reviewing separately obtained history, performing a medically appropriate exam/evaluation, counseling and educating, referring and communicating with other health care professionals, documenting clinical information in the EHR, independently interpreting results, and communicating results.    Lamar Chris, MD, PhD 04/03/2024, 4:45 PM [facility] [phone] or if no answer [phone] "

## 2024-04-24 ENCOUNTER — Ambulatory Visit: Admitting: Podiatry

## 2024-06-06 ENCOUNTER — Ambulatory Visit: Admitting: Adult Health
